# Patient Record
Sex: Male | Born: 1945
Health system: Southern US, Community
[De-identification: ages and names within clinical notes are randomized; demographics above are authoritative.]

## PROBLEM LIST (undated history)

## (undated) DIAGNOSIS — N400 Enlarged prostate without lower urinary tract symptoms: Secondary | ICD-10-CM

## (undated) DIAGNOSIS — K219 Gastro-esophageal reflux disease without esophagitis: Secondary | ICD-10-CM

## (undated) DIAGNOSIS — K2960 Other gastritis without bleeding: Secondary | ICD-10-CM

## (undated) DIAGNOSIS — K047 Periapical abscess without sinus: Secondary | ICD-10-CM

## (undated) DIAGNOSIS — H269 Unspecified cataract: Secondary | ICD-10-CM

## (undated) DIAGNOSIS — Z87442 Personal history of urinary calculi: Secondary | ICD-10-CM

## (undated) DIAGNOSIS — K6389 Other specified diseases of intestine: Secondary | ICD-10-CM

## (undated) DIAGNOSIS — C801 Malignant (primary) neoplasm, unspecified: Secondary | ICD-10-CM

## (undated) DIAGNOSIS — N200 Calculus of kidney: Secondary | ICD-10-CM

## (undated) HISTORY — DX: Unspecified cataract: H26.9

## (undated) HISTORY — PX: OTHER SURGICAL HISTORY: SHX169

## (undated) HISTORY — PX: COLON SURGERY: SHX602

## (undated) HISTORY — PX: PROSTATE BIOPSY: SHX241

---

## 2006-06-02 HISTORY — PX: HERNIA REPAIR: SHX51

## 2007-04-28 ENCOUNTER — Observation Stay (HOSPITAL_COMMUNITY): Admission: RE | Admit: 2007-04-28 | Discharge: 2007-04-28 | Payer: Self-pay | Admitting: General Surgery

## 2009-06-02 DIAGNOSIS — N200 Calculus of kidney: Secondary | ICD-10-CM

## 2009-06-02 HISTORY — DX: Calculus of kidney: N20.0

## 2009-12-03 ENCOUNTER — Emergency Department (HOSPITAL_COMMUNITY): Admission: EM | Admit: 2009-12-03 | Discharge: 2009-12-03 | Payer: Self-pay | Admitting: Emergency Medicine

## 2009-12-05 ENCOUNTER — Ambulatory Visit (HOSPITAL_COMMUNITY): Admission: RE | Admit: 2009-12-05 | Discharge: 2009-12-05 | Payer: Self-pay | Admitting: Urology

## 2009-12-10 ENCOUNTER — Ambulatory Visit (HOSPITAL_COMMUNITY): Admission: RE | Admit: 2009-12-10 | Discharge: 2009-12-10 | Payer: Self-pay | Admitting: Urology

## 2009-12-17 ENCOUNTER — Ambulatory Visit (HOSPITAL_COMMUNITY): Admission: RE | Admit: 2009-12-17 | Discharge: 2009-12-17 | Payer: Self-pay | Admitting: Urology

## 2009-12-27 ENCOUNTER — Ambulatory Visit (HOSPITAL_COMMUNITY): Admission: RE | Admit: 2009-12-27 | Discharge: 2009-12-27 | Payer: Self-pay | Admitting: Urology

## 2010-08-18 LAB — URINALYSIS, ROUTINE W REFLEX MICROSCOPIC
Glucose, UA: NEGATIVE mg/dL
Ketones, ur: NEGATIVE mg/dL
Protein, ur: NEGATIVE mg/dL

## 2010-08-18 LAB — DIFFERENTIAL
Basophils Relative: 1 % (ref 0–1)
Eosinophils Absolute: 0.2 10*3/uL (ref 0.0–0.7)
Eosinophils Relative: 2 % (ref 0–5)
Lymphocytes Relative: 24 % (ref 12–46)
Lymphs Abs: 1.8 10*3/uL (ref 0.7–4.0)
Monocytes Relative: 4 % (ref 3–12)
Neutro Abs: 5.2 10*3/uL (ref 1.7–7.7)
Neutrophils Relative %: 69 % (ref 43–77)

## 2010-08-18 LAB — CBC
HCT: 43.1 % (ref 39.0–52.0)
WBC: 7.6 10*3/uL (ref 4.0–10.5)

## 2010-08-18 LAB — COMPREHENSIVE METABOLIC PANEL
AST: 51 U/L — ABNORMAL HIGH (ref 0–37)
Albumin: 3.9 g/dL (ref 3.5–5.2)
Alkaline Phosphatase: 68 U/L (ref 39–117)
BUN: 16 mg/dL (ref 6–23)
GFR calc Af Amer: >60 mL/min (ref >60)
Potassium: 3.8 mEq/L (ref 3.5–5.1)
Sodium: 135 mEq/L (ref 135–145)
Total Bilirubin: 0.5 mg/dL (ref 0.3–1.2)
Total Protein: 7 g/dL (ref 6.0–8.3)

## 2010-08-18 LAB — URINE MICROSCOPIC-ADD ON

## 2010-10-15 NOTE — Op Note (Signed)
NAMEWENDALL, William Burns               ACCOUNT NO.:  [id number]   MEDICAL RECORD NO.:  [id number]          PATIENT TYPE:  AMB   LOCATION:  DAY                           FACILITY:  [facility]   PHYSICIAN:  Tilford Pillar, MD      DATE OF BIRTH:  1946-05-08   DATE OF PROCEDURE:  04/28/2007  DATE OF DISCHARGE:                               OPERATIVE REPORT   PREOPERATIVE DIAGNOSES:  Umbilical hernia (incarcerated).   POST-PROCEDURE DIAGNOSIS:  Umbilical hernia (incarcerated).   PROCEDURE:  Laparoscopic umbilical hernia repair with 10 by 15 cm  PROCEED mesh.   SURGEON:  Tilford Pillar, M.D.   ANESTHESIA:  General endotracheal.  Local anesthetic:  1% lidocaine  plain.   ESTIMATED BLOOD LOSS:  Minimal.   SPECIMENS:  None.   INDICATIONS:  Patient is a pleasant, 65 year old male, who had presented  to my office with a history of an umbilical bulge.  This had slowly  increased in size and was causing more burning discomfort.  On  evaluation, patient clearly had an umbilical hernia.  The risks,  benefits, and alternatives of a laparoscopic, versus an open hernia  repair were discussed at length with the patient.  Secondary to the  patient's occupation and need to return to work at a sooner time, it was  recommended that he undergo a laparoscopic repair.  The patient's  questions and concerns were addressed and patient was consented for the  planned laparoscopic umbilical hernia repair.   OPERATION:  Patient was taken to the operating room, was placed in the  supine position on the operating table, at which time a general  anesthetic was administered.  Once patient was asleep, he was  endotracheally intubated by anesthesia.  At this point, a Foley was  placed in sterile fashion by myself, and then his abdomen was prepped  and draped in the usual fashion.   A stab incision was made in the left upper quadrant at Palmer's point.  A Veress needle was inserted and a saline drop test was utilized  to  confirm intraperitoneal placement.  At this point, CO2 insufflation was  begun and, once sufficient pneumoperitoneum was obtained, an incision  was created in the right lateral abdominal wall.  An 11 mm trocar was  placed over a laparoscope and was inserted into the abdominal wall at  the incision, visualizing placement with the laparoscope into the  peritoneal cavity.  At this point, the inner cannula was removed.  The  laparoscope was reinserted.  There was no evidence of any Veress needle  or trocar placement injury.   At this point, the Veress needle was removed and attention was turned to  placement of a 5 mm trocar.  This was placed inferior to the previously-  placed 11 mm trocar in the right lateral abdominal wall, under direct  visualization with the laparoscope.   At this point, the hernia was identified, using combination of  electrocautery and blunt retraction on the hernia, the hernia contents  were reduced back into the abdominal cavity.  At this point, an  additional 5 mm trocar  was placed in the left lateral abdominal wall  under direct visualization with the laparoscope.  At this point,  attention was turned to measurement of the area around the hernia.  Using a marking pen and palpation around the anterior abdominal wall,  the planned sites of pexing sutures were placed and marked  appropriately.  The pneumoperitoneum was briefly evacuated in order to  allow adequate measurement, which measured 10 cm wide by 15 cm in  length.   At this point, the pneumoperitoneum was returned and a 10 by 15 cm  PROCEED mesh was brought to the field.  This was marked with a marking  pen for adequate orientation and 2-0 Novofil sutures were placed for the  pexing sutures at all four quadrants of the mesh.  The mesh was then  rolled and placed via the 11 mm trocar.  The laparoscope was inserted.  The mesh was positioned for adequate orientation.  Then, through a stab  incision at  the previously-marked pexing sites, the Novofil sutures were  pulled through the anterior abdominal wall, using an Endoclose suture-  passing device.  The sutures were briefly held in position with  hemostats.  Once all four quadrants were placed to the anterior  abdominal wall, the mesh was pulled taut to the anterior abdominal wall  to assure adequate positioning.  The hernia defect was well-covered.  The mesh was lying in excellent position.  At this point, the sutures  were secured and a ProTack spiral tacking device was brought to the  field and was utilized to tack the mesh circumferentially to the  anterior abdominal wall.  At this point, the mesh was lying in excellent  position.   A 5 mm laparoscope was then inserted through the left anterior abdominal  wall, allowing visualization of the 1 mm trocar site.  Using the  Endoclose suture-passing device, a 2-0 Vicryl suture was passed for  closure of the fascia at the 11 mm trocar site.  With this suture  placed, the pneumoperitoneum was evacuated, trocars were removed, the  Vicryl suture was secured.  Local anesthetic was injected at all trocar  sites, as well as around the area of mesh placement.  The skin edges at  all three trocar sites was reapproximated, using a 4-0 Monocryl in a  running subcuticular suture.  The skin was washed and dried with a moist  and dry towel.  Benzoin was applied around all trocar sites, as well as  all stab incisions.  Half-inch Steri-Strips were placed over the  incision.  The drapes were removed.  The patient was allowed to come out  of general anesthetic.  The Foley catheter was removed and the patient  was transferred to a regular hospital bed.  He was transferred to the  postanesthetic care unit in stable condition.   At the conclusion of the procedure, all instrument, sponge and needle  counts were correct.  The patient tolerated the procedure well.      Tilford Pillar, MD  Electronically  Signed     BZ/MEDQ  D:  04/28/2007  T:  04/28/2007  Job:  [id number]   cc:   Ernestina Penna  Fax: [phone]

## 2010-10-15 NOTE — H&P (Signed)
William Burns, William Burns               ACCOUNT NO.:  [id number]   MEDICAL RECORD NO.:  [id number]          PATIENT TYPE:  AMB   LOCATION:  DAY                           FACILITY:  [facility]   PHYSICIAN:  Tilford Pillar, MD      DATE OF BIRTH:  11/20/1945   DATE OF ADMISSION:  DATE OF DISCHARGE:  LH                              HISTORY & PHYSICAL   CHIEF COMPLAINT:  Umbilical bulge.   HISTORY OF PRESENT ILLNESS:  The patient is a 65 year old male with a  history of an umbilical hernia.  He had previously seen his primary care  physician who had explained the hernia to the patient.  At that time, he  chose not to do anything.  He has noted a slow increase in the size,  with increasing discomfort at the site.  He does state that the bulge  does become smaller at night.  He has had no symptoms of nausea and  vomiting, no signs or symptoms of incarceration or strangulation, and no  other masses or bulges noted.   PAST MEDICAL HISTORY:  None.   PAST SURGICAL HISTORY:  None.   MEDICATIONS:  None.   ALLERGIES:  NO KNOWN DRUG ALLERGIES.   SOCIAL HISTORY:  No tobacco, no alcohol, no recreational drug use.  The  patient is a Visual merchandiser.   FAMILY HISTORY:  Pertinent for Alzheimer's.   REVIEW OF SYSTEMS:  CONSTITUTIONAL:  Unremarkable.  EYES:  Unremarkable.  EARS, NOSE, AND THROAT:  Unremarkable.  RESPIRATORY:  Unremarkable.  CARDIOVASCULAR:  Unremarkable.  GASTROINTESTINAL:  Abdominal pain and as  per HPI.  Otherwise, unremarkable.  GENITOURINARY:  Unremarkable.  MUSCULOSKELETAL:  Unremarkable.  SKIN:  Unremarkable.  ENDOCRINE:  Unremarkable.  NEUROLOGIC:  Unremarkable.   PHYSICAL EXAMINATION:  GENERAL:  The patient is a healthy-appearing  male.  HEENT:  No scalp deformities, no masses.  Eyes:  Pupils are equal,  reactive, and round.  Extraocular movements are intact.  No conjunctival  pallor is noted.  Ears:  No diminished hearing is apparent on  examination.  Nose:  He does have a small  abrasion.  Otherwise,  unremarkable.  Oral mucosa is pink.  Normal oral occlusion.  NECK:  Trachea is midline.  No cervical lymphadenopathy is apparent.  PULMONARY:  Unlabored respirations.  No wheezing, no crackles.  Clear to  auscultation bilaterally.  CARDIOVASCULAR:  Regular rate and rhythm.  No murmurs apparent.  Pulses  2+ radial, 2+ dorsalis pedis bilaterally.  ABDOMEN:  Positive bowel sounds.  The abdomen is soft.  The abdomen is  mildly tender at the umbilicus.  No peritoneal signs, no significant  diffuse abdominal pain.  There is an apparent appreciated umbilical  hernia.  This is reducible on examination.  No other masses or hernias  are apparent.  SKIN:  Warm and dry.   ASSESSMENT AND PLAN:  Umbilical hernia.  The risks and benefits of a  laparoscopic or open umbilical hernia repair with mesh were discussed  with the patient.  Again, due to the size, it is likely that this will  require a mesh repair  as discussed with the patient.  The benefits in  this patient's circumstances of earlier return to work with a  laparoscopic approach were discussed.  Due to the patient's occupation  and limited ability for time away from work and limited ability for  light duty, we will to proceed with a laparoscopic approach.  The risks  of a laparoscopic hernia repair with mesh were discussed with the  patient, including the risks of bleeding, infection, infection requiring  removal of the mesh, the possibility of bowel injury, as well as the  possibility of intraoperative pulmonary or cardiac events.  The  patient's questions were answered, and the patient will be consented for  planned umbilical hernia repair through a laparoscopic approach.      Tilford Pillar, MD  Electronically Signed     BZ/MEDQ  D:  04/26/2007  T:  04/26/2007  Job:  [id number]   cc:   [facility]   Ernestina Penna  Fax: [phone]

## 2011-03-11 LAB — CBC
HCT: 43.4
Hemoglobin: 14.8
MCHC: 34.1
RBC: 5.06
WBC: 6

## 2011-03-11 LAB — BASIC METABOLIC PANEL
CO2: 28
Calcium: 9.5
Potassium: 4.1

## 2011-03-11 LAB — PROTIME-INR: INR: 1

## 2011-06-18 DIAGNOSIS — Z1211 Encounter for screening for malignant neoplasm of colon: Secondary | ICD-10-CM | POA: Diagnosis not present

## 2011-06-18 DIAGNOSIS — K921 Melena: Secondary | ICD-10-CM | POA: Diagnosis not present

## 2011-06-30 ENCOUNTER — Ambulatory Visit (HOSPITAL_COMMUNITY)
Admission: RE | Admit: 2011-06-30 | Discharge: 2011-06-30 | Disposition: A | Discharge status: Home / Self Care | Payer: Medicare Other | Source: Ambulatory Visit | Attending: Internal Medicine | Admitting: Internal Medicine

## 2011-06-30 ENCOUNTER — Other Ambulatory Visit (HOSPITAL_COMMUNITY): Payer: Self-pay | Admitting: Internal Medicine

## 2011-06-30 DIAGNOSIS — R0602 Shortness of breath: Secondary | ICD-10-CM

## 2011-07-01 ENCOUNTER — Emergency Department (HOSPITAL_COMMUNITY): Payer: Medicare Other

## 2011-07-01 ENCOUNTER — Other Ambulatory Visit: Payer: Self-pay

## 2011-07-01 ENCOUNTER — Inpatient Hospital Stay (HOSPITAL_COMMUNITY)
Admission: EM | Admit: 2011-07-01 | Discharge: 2011-07-12 | DRG: 330 | Disposition: A | Discharge status: Home / Self Care | Payer: Medicare Other | Attending: General Surgery | Admitting: General Surgery

## 2011-07-01 ENCOUNTER — Encounter (HOSPITAL_COMMUNITY): Payer: Self-pay | Admitting: *Deleted

## 2011-07-01 DIAGNOSIS — D630 Anemia in neoplastic disease: Secondary | ICD-10-CM | POA: Diagnosis present

## 2011-07-01 DIAGNOSIS — N210 Calculus in bladder: Secondary | ICD-10-CM | POA: Diagnosis present

## 2011-07-01 DIAGNOSIS — R339 Retention of urine, unspecified: Secondary | ICD-10-CM | POA: Diagnosis present

## 2011-07-01 DIAGNOSIS — D649 Anemia, unspecified: Secondary | ICD-10-CM

## 2011-07-01 DIAGNOSIS — R0602 Shortness of breath: Secondary | ICD-10-CM | POA: Diagnosis present

## 2011-07-01 DIAGNOSIS — Z860100 Personal history of colon polyps, unspecified: Secondary | ICD-10-CM

## 2011-07-01 DIAGNOSIS — N138 Other obstructive and reflux uropathy: Secondary | ICD-10-CM | POA: Diagnosis present

## 2011-07-01 DIAGNOSIS — M79609 Pain in unspecified limb: Secondary | ICD-10-CM | POA: Diagnosis not present

## 2011-07-01 DIAGNOSIS — K044 Acute apical periodontitis of pulpal origin: Secondary | ICD-10-CM | POA: Diagnosis present

## 2011-07-01 DIAGNOSIS — C189 Malignant neoplasm of colon, unspecified: Secondary | ICD-10-CM | POA: Diagnosis present

## 2011-07-01 DIAGNOSIS — M2040 Other hammer toe(s) (acquired), unspecified foot: Secondary | ICD-10-CM | POA: Diagnosis not present

## 2011-07-01 DIAGNOSIS — D128 Benign neoplasm of rectum: Secondary | ICD-10-CM | POA: Diagnosis present

## 2011-07-01 DIAGNOSIS — Z79899 Other long term (current) drug therapy: Secondary | ICD-10-CM

## 2011-07-01 DIAGNOSIS — K2960 Other gastritis without bleeding: Secondary | ICD-10-CM

## 2011-07-01 DIAGNOSIS — K047 Periapical abscess without sinus: Secondary | ICD-10-CM | POA: Diagnosis present

## 2011-07-01 DIAGNOSIS — M722 Plantar fascial fibromatosis: Secondary | ICD-10-CM | POA: Diagnosis not present

## 2011-07-01 DIAGNOSIS — K6389 Other specified diseases of intestine: Secondary | ICD-10-CM

## 2011-07-01 DIAGNOSIS — R5381 Other malaise: Secondary | ICD-10-CM | POA: Diagnosis not present

## 2011-07-01 DIAGNOSIS — K922 Gastrointestinal hemorrhage, unspecified: Secondary | ICD-10-CM | POA: Diagnosis not present

## 2011-07-01 DIAGNOSIS — N400 Enlarged prostate without lower urinary tract symptoms: Secondary | ICD-10-CM | POA: Diagnosis present

## 2011-07-01 DIAGNOSIS — R5383 Other fatigue: Secondary | ICD-10-CM | POA: Diagnosis not present

## 2011-07-01 DIAGNOSIS — C180 Malignant neoplasm of cecum: Principal | ICD-10-CM | POA: Diagnosis present

## 2011-07-01 DIAGNOSIS — N401 Enlarged prostate with lower urinary tract symptoms: Secondary | ICD-10-CM | POA: Diagnosis present

## 2011-07-01 DIAGNOSIS — D129 Benign neoplasm of anus and anal canal: Secondary | ICD-10-CM | POA: Diagnosis present

## 2011-07-01 DIAGNOSIS — Z87891 Personal history of nicotine dependence: Secondary | ICD-10-CM

## 2011-07-01 DIAGNOSIS — R7309 Other abnormal glucose: Secondary | ICD-10-CM | POA: Diagnosis present

## 2011-07-01 DIAGNOSIS — Z7982 Long term (current) use of aspirin: Secondary | ICD-10-CM

## 2011-07-01 DIAGNOSIS — D509 Iron deficiency anemia, unspecified: Secondary | ICD-10-CM | POA: Diagnosis present

## 2011-07-01 DIAGNOSIS — E669 Obesity, unspecified: Secondary | ICD-10-CM | POA: Diagnosis present

## 2011-07-01 DIAGNOSIS — R739 Hyperglycemia, unspecified: Secondary | ICD-10-CM | POA: Diagnosis present

## 2011-07-01 HISTORY — DX: Benign prostatic hyperplasia without lower urinary tract symptoms: N40.0

## 2011-07-01 HISTORY — DX: Periapical abscess without sinus: K04.7

## 2011-07-01 HISTORY — DX: Other specified diseases of intestine: K63.89

## 2011-07-01 HISTORY — DX: Other gastritis without bleeding: K29.60

## 2011-07-01 HISTORY — DX: Calculus of kidney: N20.0

## 2011-07-01 LAB — CBC
Hemoglobin: 6.2 g/dL — CL (ref 13.0–17.0)
MCH: 19 pg — ABNORMAL LOW (ref 26.0–34.0)
MCV: 69 fL — ABNORMAL LOW (ref 78.0–100.0)

## 2011-07-01 LAB — COMPREHENSIVE METABOLIC PANEL
AST: 32 U/L (ref 0–37)
Albumin: 3.4 g/dL — ABNORMAL LOW (ref 3.5–5.2)
Alkaline Phosphatase: 73 U/L (ref 39–117)
BUN: 16 mg/dL (ref 6–23)
CO2: 24 mEq/L (ref 19–32)
Chloride: 105 mEq/L (ref 96–112)
Creatinine, Ser: 0.99 mg/dL (ref 0.50–1.35)
GFR calc non Af Amer: 83 mL/min — ABNORMAL LOW (ref >90)
Potassium: 4.3 mEq/L (ref 3.5–5.1)
Total Bilirubin: 0.3 mg/dL (ref 0.3–1.2)

## 2011-07-01 LAB — URINALYSIS, ROUTINE W REFLEX MICROSCOPIC
Bilirubin Urine: NEGATIVE
Hgb urine dipstick: NEGATIVE
Ketones, ur: NEGATIVE mg/dL
Nitrite: NEGATIVE
Protein, ur: NEGATIVE mg/dL
Specific Gravity, Urine: 1.02 (ref 1.005–1.030)
Urobilinogen, UA: 0.2 mg/dL (ref 0.0–1.0)

## 2011-07-01 LAB — RETICULOCYTES
RBC.: 3.26 MIL/uL — ABNORMAL LOW (ref 4.22–5.81)
Retic Count, Absolute: 65.2 10*3/uL (ref 19.0–186.0)
Retic Ct Pct: 2 % (ref 0.4–3.1)

## 2011-07-01 NOTE — ED Notes (Signed)
Dr. Margo Aye called and spoke with Dr. Preston Fleeting about pt coming to ED. Pt had blood work this am and was contacted about abnormal labs, low hbg, pt does reports increased weakness, fatigue and pain in legs with ambulation

## 2011-07-01 NOTE — ED Provider Notes (Signed)
I have received a call from the patient's PCP, Dr. Margo Aye, who relates that he had a critical lab result called to him of a hemoglobin of 6.2. Initial orders have been placed for laboratory workup and transfusion.  Dione Booze, MD 07/01/11 2232

## 2011-07-02 ENCOUNTER — Encounter (HOSPITAL_COMMUNITY): Payer: Self-pay | Admitting: Internal Medicine

## 2011-07-02 DIAGNOSIS — K921 Melena: Secondary | ICD-10-CM

## 2011-07-02 DIAGNOSIS — D126 Benign neoplasm of colon, unspecified: Secondary | ICD-10-CM | POA: Diagnosis not present

## 2011-07-02 DIAGNOSIS — N200 Calculus of kidney: Secondary | ICD-10-CM | POA: Diagnosis not present

## 2011-07-02 DIAGNOSIS — C189 Malignant neoplasm of colon, unspecified: Secondary | ICD-10-CM | POA: Diagnosis not present

## 2011-07-02 DIAGNOSIS — R7989 Other specified abnormal findings of blood chemistry: Secondary | ICD-10-CM | POA: Diagnosis not present

## 2011-07-02 DIAGNOSIS — D630 Anemia in neoplastic disease: Secondary | ICD-10-CM | POA: Diagnosis not present

## 2011-07-02 DIAGNOSIS — N210 Calculus in bladder: Secondary | ICD-10-CM | POA: Diagnosis not present

## 2011-07-02 DIAGNOSIS — D649 Anemia, unspecified: Secondary | ICD-10-CM | POA: Diagnosis not present

## 2011-07-02 DIAGNOSIS — N401 Enlarged prostate with lower urinary tract symptoms: Secondary | ICD-10-CM | POA: Diagnosis not present

## 2011-07-02 DIAGNOSIS — Z7982 Long term (current) use of aspirin: Secondary | ICD-10-CM | POA: Diagnosis not present

## 2011-07-02 DIAGNOSIS — R7309 Other abnormal glucose: Secondary | ICD-10-CM | POA: Diagnosis present

## 2011-07-02 DIAGNOSIS — C180 Malignant neoplasm of cecum: Secondary | ICD-10-CM | POA: Diagnosis not present

## 2011-07-02 DIAGNOSIS — K2960 Other gastritis without bleeding: Secondary | ICD-10-CM | POA: Diagnosis not present

## 2011-07-02 DIAGNOSIS — K044 Acute apical periodontitis of pulpal origin: Secondary | ICD-10-CM | POA: Diagnosis not present

## 2011-07-02 DIAGNOSIS — Z79899 Other long term (current) drug therapy: Secondary | ICD-10-CM | POA: Diagnosis not present

## 2011-07-02 DIAGNOSIS — Z87891 Personal history of nicotine dependence: Secondary | ICD-10-CM | POA: Diagnosis not present

## 2011-07-02 DIAGNOSIS — K2289 Other specified disease of esophagus: Secondary | ICD-10-CM | POA: Diagnosis not present

## 2011-07-02 DIAGNOSIS — K2270 Barrett's esophagus without dysplasia: Secondary | ICD-10-CM | POA: Diagnosis not present

## 2011-07-02 DIAGNOSIS — E669 Obesity, unspecified: Secondary | ICD-10-CM | POA: Diagnosis present

## 2011-07-02 DIAGNOSIS — N400 Enlarged prostate without lower urinary tract symptoms: Secondary | ICD-10-CM | POA: Diagnosis not present

## 2011-07-02 DIAGNOSIS — K922 Gastrointestinal hemorrhage, unspecified: Secondary | ICD-10-CM | POA: Diagnosis not present

## 2011-07-02 DIAGNOSIS — R0602 Shortness of breath: Secondary | ICD-10-CM | POA: Diagnosis present

## 2011-07-02 DIAGNOSIS — Z860100 Personal history of colon polyps, unspecified: Secondary | ICD-10-CM

## 2011-07-02 DIAGNOSIS — D291 Benign neoplasm of prostate: Secondary | ICD-10-CM | POA: Diagnosis not present

## 2011-07-02 DIAGNOSIS — K0389 Other specified diseases of hard tissues of teeth: Secondary | ICD-10-CM | POA: Diagnosis not present

## 2011-07-02 DIAGNOSIS — R739 Hyperglycemia, unspecified: Secondary | ICD-10-CM | POA: Diagnosis present

## 2011-07-02 DIAGNOSIS — R339 Retention of urine, unspecified: Secondary | ICD-10-CM | POA: Diagnosis present

## 2011-07-02 DIAGNOSIS — N3289 Other specified disorders of bladder: Secondary | ICD-10-CM | POA: Diagnosis not present

## 2011-07-02 DIAGNOSIS — C19 Malignant neoplasm of rectosigmoid junction: Secondary | ICD-10-CM | POA: Diagnosis not present

## 2011-07-02 DIAGNOSIS — D509 Iron deficiency anemia, unspecified: Secondary | ICD-10-CM | POA: Diagnosis present

## 2011-07-02 DIAGNOSIS — D128 Benign neoplasm of rectum: Secondary | ICD-10-CM | POA: Diagnosis present

## 2011-07-02 DIAGNOSIS — R5381 Other malaise: Secondary | ICD-10-CM | POA: Diagnosis not present

## 2011-07-02 LAB — CARDIAC PANEL(CRET KIN+CKTOT+MB+TROPI)
CK, MB: 3 ng/mL (ref 0.3–4.0)
Relative Index: 2.1 (ref 0.0–2.5)
Relative Index: 2.3 (ref 0.0–2.5)
Total CK: 142 U/L (ref 7–232)
Total CK: 109 U/L (ref 7–232)

## 2011-07-02 LAB — BASIC METABOLIC PANEL
BUN: 14 mg/dL (ref 6–23)
Chloride: 105 mEq/L (ref 96–112)
GFR calc Af Amer: >90 mL/min (ref >90)
GFR calc non Af Amer: 86 mL/min — ABNORMAL LOW (ref >90)
Glucose, Bld: 101 mg/dL — ABNORMAL HIGH (ref 70–99)
Potassium: 4.1 mEq/L (ref 3.5–5.1)
Sodium: 138 mEq/L (ref 135–145)

## 2011-07-02 LAB — DIFFERENTIAL
Basophils Absolute: 0.1 10*3/uL (ref 0.0–0.1)
Basophils Relative: 1 % (ref 0–1)
Lymphocytes Relative: 8 % — ABNORMAL LOW (ref 12–46)
Lymphs Abs: 0.7 10*3/uL (ref 0.7–4.0)
Monocytes Absolute: 0.2 10*3/uL (ref 0.1–1.0)
Neutro Abs: 7.6 10*3/uL (ref 1.7–7.7)
Neutrophils Relative %: 89 % — ABNORMAL HIGH (ref 43–77)

## 2011-07-02 LAB — POCT I-STAT, CHEM 8
Calcium, Ion: 1.18 mmol/L (ref 1.12–1.32)
Glucose, Bld: 152 mg/dL — ABNORMAL HIGH (ref 70–99)
HCT: 23 % — ABNORMAL LOW (ref 39.0–52.0)
Hemoglobin: 7.8 g/dL — ABNORMAL LOW (ref 13.0–17.0)
Potassium: 4.4 mEq/L (ref 3.5–5.1)

## 2011-07-02 LAB — CBC
HCT: 26.1 % — ABNORMAL LOW (ref 39.0–52.0)
Hemoglobin: 7.8 g/dL — ABNORMAL LOW (ref 13.0–17.0)
MCHC: 29.9 g/dL — ABNORMAL LOW (ref 30.0–36.0)
RBC: 3.62 MIL/uL — ABNORMAL LOW (ref 4.22–5.81)

## 2011-07-02 LAB — PREPARE RBC (CROSSMATCH)

## 2011-07-02 LAB — ABO/RH: ABO/RH(D): B POS

## 2011-07-02 LAB — POCT I-STAT TROPONIN I: Troponin i, poc: 0 ng/mL (ref 0.00–0.08)

## 2011-07-02 LAB — TSH: TSH: 1.524 u[IU]/mL (ref 0.350–4.500)

## 2011-07-02 LAB — HEMOGLOBIN A1C: Hgb A1c MFr Bld: 5.7 % — ABNORMAL HIGH (ref <5.7)

## 2011-07-02 MED ORDER — ONDANSETRON HCL 4 MG/2ML IJ SOLN: 4.0000 mg | Freq: Four times a day (QID) | INTRAMUSCULAR | Status: DC | PRN

## 2011-07-02 MED ORDER — PENICILLIN V POTASSIUM 250 MG PO TABS
500.0000 mg | ORAL_TABLET | Freq: Three times a day (TID) | ORAL | Status: DC
  Administered 2011-07-02 – 2011-07-09 (×19): 500 mg via ORAL
  Filled 2011-07-02: qty 2
  Filled 2011-07-02 (×3): qty 1
  Filled 2011-07-02 (×3): qty 2
  Filled 2011-07-02: qty 4
  Filled 2011-07-02: qty 1
  Filled 2011-07-02 (×11): qty 2

## 2011-07-02 MED ORDER — PANTOPRAZOLE SODIUM 40 MG IV SOLR
40.0000 mg | Freq: Two times a day (BID) | INTRAVENOUS | Status: DC
  Administered 2011-07-02 – 2011-07-03 (×3): 40 mg via INTRAVENOUS
  Filled 2011-07-02 (×3): qty 40

## 2011-07-02 MED ORDER — ACETAMINOPHEN 325 MG PO TABS: 650.0000 mg | ORAL_TABLET | Freq: Four times a day (QID) | ORAL | Status: DC | PRN

## 2011-07-02 MED ORDER — POTASSIUM CHLORIDE IN NACL 20-0.9 MEQ/L-% IV SOLN
INTRAVENOUS | Status: DC
  Administered 2011-07-02: 09:00:00 via INTRAVENOUS
  Administered 2011-07-03: 950 mL via INTRAVENOUS

## 2011-07-02 MED ORDER — SODIUM CHLORIDE 0.9 % IV SOLN
1.5000 g | Freq: Four times a day (QID) | INTRAVENOUS | Status: DC
  Filled 2011-07-02 (×3): qty 1.5

## 2011-07-02 MED ORDER — PEG 3350-KCL-NA BICARB-NACL 420 G PO SOLR
4000.0000 mL | Freq: Once | ORAL | Status: AC
  Administered 2011-07-02: 4000 mL via ORAL
  Filled 2011-07-02: qty 4000

## 2011-07-02 MED ORDER — SODIUM CHLORIDE 0.9 % IJ SOLN
INTRAMUSCULAR | Status: AC
  Administered 2011-07-02: 03:00:00
  Filled 2011-07-02: qty 3

## 2011-07-02 MED ORDER — ACETAMINOPHEN 650 MG RE SUPP: 650.0000 mg | Freq: Four times a day (QID) | RECTAL | Status: DC | PRN

## 2011-07-02 MED ORDER — ONDANSETRON HCL 4 MG PO TABS: 4.0000 mg | ORAL_TABLET | Freq: Four times a day (QID) | ORAL | Status: DC | PRN

## 2011-07-02 MED ORDER — FUROSEMIDE 10 MG/ML IJ SOLN
20.0000 mg | Freq: Once | INTRAMUSCULAR | Status: AC
  Administered 2011-07-02: 20 mg via INTRAVENOUS
  Filled 2011-07-02: qty 2

## 2011-07-02 NOTE — ED Notes (Signed)
Transported to room 316 with telemetry.  Monitor shows NSR without ectopy.

## 2011-07-02 NOTE — ED Provider Notes (Signed)
History     CSN: [id number]  Arrival date & time 07/01/11  2219    Chief Complaint  Patient presents with  . Weakness    HPI Pt was seen at 2335.  Per pt and his spouse, c/o gradual onset and worsening of persistent generalized "weakness" and fatigue for the last 3 weeks.  Pt has been eval by his PMD for same yesterday and today; having a CXR and labs.  Pt was called tonight and told to come to the ED for eval/admit for "HGB 6.2."  Pt denies CP/palpitations, no SOB, no abd pain, no N/V/D, no back pain, no abd pain, no black or blood in stools, no fevers, no rash.    PMD:  Dr. Hughie Closs History reviewed. No pertinent past medical history.  Past Surgical History  Procedure Date  . Hernia repair   . Prostate biopsy     History  Substance Use Topics  . Smoking status: Never Smoker   . Smokeless tobacco: Not on file  . Alcohol Use: No    Review of Systems ROS: Statement: All systems negative except as marked or noted in the HPI; Constitutional: Negative for fever and chills. ; ; Eyes: Negative for eye pain, redness and discharge. ; ; ENMT: Negative for ear pain, hoarseness, nasal congestion, sinus pressure and sore throat. ; ; Cardiovascular: Negative for chest pain, palpitations, diaphoresis, dyspnea and peripheral edema. ; ; Respiratory: Negative for cough, wheezing and stridor. ; ; Gastrointestinal: Negative for nausea, vomiting, diarrhea, abdominal pain, blood in stool, hematemesis, jaundice and rectal bleeding. . ; ; Genitourinary: Negative for dysuria, flank pain and hematuria. ; ; Musculoskeletal: Negative for back pain and neck pain. Negative for swelling and trauma.; ; Skin: Negative for pruritus, rash, abrasions, blisters, bruising and skin lesion.; ; Neuro: +generalized weakness.  Negative for headache, lightheadedness and neck stiffness. Negative for altered level of consciousness , altered mental status, extremity weakness, paresthesias, involuntary movement, seizure and  syncope.     Allergies  Review of patient's allergies indicates no known allergies.  Home Medications   Current Outpatient Rx  Name Route Sig Dispense Refill  . ASPIRIN EC 81 MG PO TBEC Oral Take 81 mg by mouth daily.    . ADULT MULTIVITAMIN W/MINERALS CH Oral Take 1 tablet by mouth daily. MEGA MENS 50+ MULTIVITAMIN    . FISH OIL TRIPLE STRENGTH PO Oral Take 1 capsule by mouth daily.    Marland Kitchen PENICILLIN V POTASSIUM 500 MG PO TABS Oral Take 500 mg by mouth 4 (four) times daily.    Marland Kitchen TAMSULOSIN HCL 0.4 MG PO CAPS Oral Take 0.4 mg by mouth daily.       BP 134/69  Pulse 97  Temp(Src) 98.2 F (36.8 C) (Oral)  Resp 20  Wt 250 lb (113.399 kg)  SpO2 100%  Physical Exam 2340: Physical examination:  Nursing notes reviewed; Vital signs and O2 SAT reviewed;  Constitutional: Well developed, Well nourished, Well hydrated, In no acute distress; Head:  Normocephalic, atraumatic; Eyes: EOMI, PERRL, No scleral icterus; ENMT: Mouth and pharynx normal, Mucous membranes moist; Neck: Supple, Full range of motion, No lymphadenopathy; Cardiovascular: Regular rate and rhythm, No murmur or gallop; Respiratory: Breath sounds clear & equal bilaterally, No rales, rhonchi, wheezes, Normal respiratory effort/excursion; Chest: Nontender, Movement normal; Abdomen: Soft, Nontender, Nondistended, Normal bowel sounds, Rectal exam performed w/permission of pt and ED RN chaparone present.  Anal tone normal.  Non-tender, soft brown stool in rectal vault, heme positive.  No fissures,  no external hemorrhoids, no palp masses.; Extremities: Pulses normal, No tenderness, No edema, No calf edema or asymmetry.; Neuro: AA&Ox3, Major CN grossly intact.  No gross focal motor or sensory deficits in extremities.; Skin: Color pale, Warm, Dry, no rash.    ED Course  Procedures     MDM  MDM Reviewed: previous chart, nursing note and vitals Reviewed previous: labs, ECG and x-ray Interpretation: labs and ECG      Date: 07/02/2011   Rate: 87  Rhythm: normal sinus rhythm  QRS Axis: normal  Intervals: normal  ST/T Wave abnormalities: normal  Conduction Disutrbances:none  Narrative Interpretation:   Old EKG Reviewed: unchanged; no significant changes from previous EKG dated 12/03/2009.  Results for orders placed during the hospital encounter of 07/01/11  CBC      Component Value Range   WBC 8.6  4.0 - 10.5 (K/uL)   RBC 3.26 (*) 4.22 - 5.81 (MIL/uL)   Hemoglobin 6.2 (*) 13.0 - 17.0 (g/dL)   HCT 16.1 (*) 09.6 - 52.0 (%)   MCV 69.0 (*) 78.0 - 100.0 (fL)   MCH 19.0 (*) 26.0 - 34.0 (pg)   MCHC 27.6 (*) 30.0 - 36.0 (g/dL)   RDW 04.5  40.9 - 81.1 (%)   Platelets 350  150 - 400 (K/uL)  DIFFERENTIAL      Component Value Range   Neutrophils Relative 89 (*) 43 - 77 (%)   Lymphocytes Relative 8 (*) 12 - 46 (%)   Monocytes Relative 2 (*) 3 - 12 (%)   Eosinophils Relative 0  0 - 5 (%)   Basophils Relative 1  0 - 1 (%)   Neutro Abs 7.6  1.7 - 7.7 (K/uL)   Lymphs Abs 0.7  0.7 - 4.0 (K/uL)   Monocytes Absolute 0.2  0.1 - 1.0 (K/uL)   Eosinophils Absolute 0.0  0.0 - 0.7 (K/uL)   Basophils Absolute 0.1  0.0 - 0.1 (K/uL)  COMPREHENSIVE METABOLIC PANEL      Component Value Range   Sodium 138  135 - 145 (mEq/L)   Potassium 4.3  3.5 - 5.1 (mEq/L)   Chloride 105  96 - 112 (mEq/L)   CO2 24  19 - 32 (mEq/L)   Glucose, Bld 169 (*) 70 - 99 (mg/dL)   BUN 16  6 - 23 (mg/dL)   Creatinine, Ser 9.14  0.50 - 1.35 (mg/dL)   Calcium 8.7  8.4 - 78.2 (mg/dL)   Total Protein 6.8  6.0 - 8.3 (g/dL)   Albumin 3.4 (*) 3.5 - 5.2 (g/dL)   AST 32  0 - 37 (U/L)   ALT 25  0 - 53 (U/L)   Alkaline Phosphatase 73  39 - 117 (U/L)   Total Bilirubin 0.3  0.3 - 1.2 (mg/dL)   GFR calc non Af Amer 83 (*) >90 (mL/min)   GFR calc Af Amer >90  >90 (mL/min)  RETICULOCYTES      Component Value Range   Retic Ct Pct 2.0  0.4 - 3.1 (%)   RBC. 3.26 (*) 4.22 - 5.81 (MIL/uL)   Retic Count, Manual 65.2  19.0 - 186.0 (K/uL)  URINALYSIS, ROUTINE W REFLEX  MICROSCOPIC      Component Value Range   Color, Urine YELLOW  YELLOW    APPearance CLEAR  CLEAR    Specific Gravity, Urine 1.020  1.005 - 1.030    pH 6.0  5.0 - 8.0    Glucose, UA 100 (*) NEGATIVE (mg/dL)   Hgb urine dipstick  NEGATIVE  NEGATIVE    Bilirubin Urine NEGATIVE  NEGATIVE    Ketones, ur NEGATIVE  NEGATIVE (mg/dL)   Protein, ur NEGATIVE  NEGATIVE (mg/dL)   Urobilinogen, UA 0.2  0.0 - 1.0 (mg/dL)   Nitrite NEGATIVE  NEGATIVE    Leukocytes, UA NEGATIVE  NEGATIVE   TYPE AND SCREEN      Component Value Range   ABO/RH(D) B POS     Antibody Screen NEG     Sample Expiration 07/04/2011    PREPARE RBC (CROSSMATCH)      Component Value Range   Order Confirmation ORDER PROCESSED BY BLOOD BANK      Dg Chest 2 View 06/30/2011  *RADIOLOGY REPORT*  Clinical Data: Short of breath  CHEST - 2 VIEW  Comparison: 12/03/2009  Findings: Normal heart size.  Clear lungs.  No pneumothorax and no pleural effusion. Mild hyperaeration.  IMPRESSION: No active cardiopulmonary disease.  Original Report Authenticated By: Donavan Burnet, M.D.     Moon.Benedict:  Hgb 6.2, PRBC transfusion ordered.  Dx testing d/w pt and family.  Questions answered.  Verb understanding, agreeable to admit.  T/C to [facility] Dr. Orvan Falconer, case discussed, including:  HPI, pertinent PM/SHx, VS/PE, dx testing, ED course and treatment.  Agreeable to admit.  Requests to obtain tele bed.     Josslyn Ciolek Allison Quarry, DO 07/04/11 [phone]

## 2011-07-02 NOTE — Progress Notes (Signed)
UR Chart Review Completed  

## 2011-07-02 NOTE — ED Notes (Signed)
Dr Orvan Falconer in talking with patient and his wife.

## 2011-07-02 NOTE — Consult Note (Signed)
Reason for Consult  GI bleed Referring Physician:  [name] is an 66 y.o. male.  HPI: Admitted last night thru the ED.He was notified by Dr. Margo Aye last night that his hemoglobin was 6.2. He has received 2 units of blood since admission.  He denies seeing blood in his stool.  He tells me he had been very tired and had gradually became weaker and weaker. Symptoms worse the past 3 weeks.  His stool was guaiac positive in the ED.  His appetite has been good. There has been no weight.  He has actually gained about 5 pounds.  His wife says his left  lower leg has been swelling.  He denies acid reflux.  No abdominal pain.  His wife also tell me that back in December he had a cough and thought he had the flu.  He would cough and have rt upper quadrant pain. He usually has 1-2 stools a day.  Stools are dark brown. Stools have normal size. His last colonoscopy was in the 1980s or early 90s with polyps. He did not follow up. He has been on ASA 81mg  x 3 weeks. Symptoms started before this. No other NSAIDS  Past Medical History  Diagnosis Date  . BPH (benign prostatic hyperplasia)   . Kidney stones 2011    Past Surgical History  Procedure Date  . Hernia repair   . Prostate biopsy     Family History  Problem Relation Age of Onset  . Alzheimer's disease Mother   . Alzheimer's disease Father   . Aneurysm Brother     brain    Social History:  reports that he quit smoking about 22 years ago. His smoking use included Cigarettes. He has a 50 pack-year smoking history. He does not have any smokeless tobacco history on file. He reports that he does not drink alcohol. His drug history not on file.  Allergies: No Known Allergies  Medications: I have reviewed the patient's current medications.  Results for orders placed during the hospital encounter of 07/01/11 (from the past 48 hour(s))  CBC     Status: Abnormal   Collection Time   07/01/11 10:29 PM      Component Value Range Comment   WBC 8.6  4.0 - 10.5 (K/uL)    RBC 3.26 (*) 4.22 - 5.81 (MIL/uL)    Hemoglobin 6.2 (*) 13.0 - 17.0 (g/dL)    HCT 45.4 (*) 09.8 - 52.0 (%)    MCV 69.0 (*) 78.0 - 100.0 (fL)    MCH 19.0 (*) 26.0 - 34.0 (pg)    MCHC 27.6 (*) 30.0 - 36.0 (g/dL)    RDW 11.9  14.7 - 82.9 (%)    Platelets 350  150 - 400 (K/uL)   DIFFERENTIAL     Status: Abnormal   Collection Time   07/01/11 10:29 PM      Component Value Range Comment   Neutrophils Relative 89 (*) 43 - 77 (%)    Lymphocytes Relative 8 (*) 12 - 46 (%)    Monocytes Relative 2 (*) 3 - 12 (%)    Eosinophils Relative 0  0 - 5 (%)    Basophils Relative 1  0 - 1 (%)    Neutro Abs 7.6  1.7 - 7.7 (K/uL)    Lymphs Abs 0.7  0.7 - 4.0 (K/uL)    Monocytes Absolute 0.2  0.1 - 1.0 (K/uL)    Eosinophils Absolute 0.0  0.0 - 0.7 (K/uL)  Basophils Absolute 0.1  0.0 - 0.1 (K/uL)   COMPREHENSIVE METABOLIC PANEL     Status: Abnormal   Collection Time   07/01/11 10:29 PM      Component Value Range Comment   Sodium 138  135 - 145 (mEq/L)    Potassium 4.3  3.5 - 5.1 (mEq/L)    Chloride 105  96 - 112 (mEq/L)    CO2 24  19 - 32 (mEq/L)    Glucose, Bld 169 (*) 70 - 99 (mg/dL)    BUN 16  6 - 23 (mg/dL)    Creatinine, Ser 9.14  0.50 - 1.35 (mg/dL)    Calcium 8.7  8.4 - 10.5 (mg/dL)    Total Protein 6.8  6.0 - 8.3 (g/dL)    Albumin 3.4 (*) 3.5 - 5.2 (g/dL)    AST 32  0 - 37 (U/L)    ALT 25  0 - 53 (U/L)    Alkaline Phosphatase 73  39 - 117 (U/L)    Total Bilirubin 0.3  0.3 - 1.2 (mg/dL)    GFR calc non Af Amer 83 (*) >90 (mL/min)    GFR calc Af Amer >90  >90 (mL/min)   RETICULOCYTES     Status: Abnormal   Collection Time   07/01/11 10:29 PM      Component Value Range Comment   Retic Ct Pct 2.0  0.4 - 3.1 (%)    RBC. 3.26 (*) 4.22 - 5.81 (MIL/uL)    Retic Count, Manual 65.2  19.0 - 186.0 (K/uL)   TYPE AND SCREEN     Status: Normal (Preliminary result)   Collection Time   07/01/11 10:31 PM      Component Value Range Comment   ABO/RH(D) B POS      Antibody  Screen NEG      Sample Expiration 07/04/2011      Unit Number [id number]      Blood Component Type RED CELLS,LR      Unit division 00      Status of Unit ISSUED      Transfusion Status OK TO TRANSFUSE      Crossmatch Result Compatible      Unit Number [id number]      Blood Component Type RED CELLS,LR      Unit division 00      Status of Unit ISSUED      Transfusion Status OK TO TRANSFUSE      Crossmatch Result Compatible     URINALYSIS, ROUTINE W REFLEX MICROSCOPIC     Status: Abnormal   Collection Time   07/01/11 10:50 PM      Component Value Range Comment   Color, Urine YELLOW  YELLOW     APPearance CLEAR  CLEAR     Specific Gravity, Urine 1.020  1.005 - 1.030     pH 6.0  5.0 - 8.0     Glucose, UA 100 (*) NEGATIVE (mg/dL)    Hgb urine dipstick NEGATIVE  NEGATIVE     Bilirubin Urine NEGATIVE  NEGATIVE     Ketones, ur NEGATIVE  NEGATIVE (mg/dL)    Protein, ur NEGATIVE  NEGATIVE (mg/dL)    Urobilinogen, UA 0.2  0.0 - 1.0 (mg/dL)    Nitrite NEGATIVE  NEGATIVE     Leukocytes, UA NEGATIVE  NEGATIVE  MICROSCOPIC NOT DONE ON URINES WITH NEGATIVE PROTEIN, BLOOD, LEUKOCYTES, NITRITE, OR GLUCOSE <1000 mg/dL.  PREPARE RBC (CROSSMATCH)     Status: Normal   Collection Time   07/01/11  11:00 PM      Component Value Range Comment   Order Confirmation ORDER PROCESSED BY BLOOD BANK     ABO/RH     Status: Normal   Collection Time   07/01/11 11:10 PM      Component Value Range Comment   ABO/RH(D) B POS     POCT I-STAT TROPONIN I     Status: Normal   Collection Time   07/02/11 12:21 AM      Component Value Range Comment   Troponin i, poc 0.00  0.00 - 0.08 (ng/mL)    Comment 3            POCT I-STAT, CHEM 8     Status: Abnormal   Collection Time   07/02/11 12:23 AM      Component Value Range Comment   Sodium 141  135 - 145 (mEq/L)    Potassium 4.4  3.5 - 5.1 (mEq/L)    Chloride 106  96 - 112 (mEq/L)    BUN 17  6 - 23 (mg/dL)    Creatinine, Ser 4.09  0.50 - 1.35 (mg/dL)    Glucose, Bld 811  (*) 70 - 99 (mg/dL)    Calcium, Ion 9.14  1.12 - 1.32 (mmol/L)    TCO2 24  0 - 100 (mmol/L)    Hemoglobin 7.8 (*) 13.0 - 17.0 (g/dL)    HCT 78.2 (*) 95.6 - 52.0 (%)   APTT     Status: Normal   Collection Time   07/02/11  1:59 AM      Component Value Range Comment   aPTT 29  24 - 37 (seconds)   PROTIME-INR     Status: Abnormal   Collection Time   07/02/11  1:59 AM      Component Value Range Comment   Prothrombin Time 15.5 (*) 11.6 - 15.2 (seconds)    INR 1.20  0.00 - 1.49    CARDIAC PANEL(CRET KIN+CKTOT+MB+TROPI)     Status: Normal   Collection Time   07/02/11  1:59 AM      Component Value Range Comment   Total CK 142  7 - 232 (U/L)    CK, MB 3.0  0.3 - 4.0 (ng/mL)    Troponin I <0.30  <0.30 (ng/mL)    Relative Index 2.1  0.0 - 2.5      Dg Chest 2 View  06/30/2011  *RADIOLOGY REPORT*  Clinical Data: Short of breath  CHEST - 2 VIEW  Comparison: 12/03/2009  Findings: Normal heart size.  Clear lungs.  No pneumothorax and no pleural effusion. Mild hyperaeration.  IMPRESSION: No active cardiopulmonary disease.  Original Report Authenticated By: Donavan Burnet, M.D.    Review of Systems  Constitutional: Negative for fever and chills.   see hpi Blood pressure 142/70, pulse 68, temperature 98.1 F (36.7 C), temperature source Oral, resp. rate 16, height 6\' 3"  (1.905 m), weight 244 lb 14.9 oz (111.1 kg), SpO2 91.00%. Physical ExamAlert and oriented. Skin warm and dry. Oral mucosa is moist.   . Sclera anicteric, conjunctivae is pink. Thyroid not enlarged. No cervical lymphadenopathy. Lungs clear. Heart regular rate and rhythm.  Abdomen is soft. Bowel sounds are positive. No hepatomegaly. No abdominal masses felt. No tenderness.  No edema to lower extremities. Patient is alert and oriented.   Assessment/Plan: Anemia. Heme  Positive stools.  Colonic neoplasm needs to be ruled out. Differential includes colonic polyps, diverticular bleed, AVM, gastric carcinoma, gastric ulcer. I will discuss  with Dr. Karilyn Cota.  Colonoscopy/EGD  SETZER,TERRI W 07/02/2011, 8:20 AM

## 2011-07-02 NOTE — H&P (Signed)
PCP:   Dwana Melena, MD, MD   Chief Complaint:  Progressive shortness of breath for 3 weeks  HPI: William Burns is an 66 y.o. Caucasian male.   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  in 3 weeks ago began to feel very sick again weakness in his legs and arms and very short of breath with exertion  Denies fever cough or cold he denies bloody or black black stool he denies nausea or he denies dyspepsia. Denies vomiting diarrhea or constipation He has been having some right lower quadrant abdominal pain which at the site of his old hernia surgery.  He denies use of Goody's Powders, ibuprofen, Aleve or any other NSAID. Denies alcohol.  he reports she had a colonoscopy back in the 80s and was told he had polyps, but never followed up for repeat colonoscopys, and his doctors subsequently retired.  He's been getting checked out by his new doctor Dr. Margo Aye since November, and blood work today revealed a hemoglobin of 6.2 and he was sent into the emergency room for further evaluation.    Rewiew of Systems:  The patient denies anorexia, fever, weight loss,, vision loss, decreased hearing, hoarseness, chest pain, syncope,  peripheral edema, balance deficits, hemoptysis, abdominal pain, melena, hematochezia, severe indigestion/heartburn, hematuria, incontinence, genital sores, muscle weakness, suspicious skin lesions, transient blindness, difficulty walking, depression, unusual weight change, abnormal bleeding, enlarged lymph nodes, angioedema, and breast masses.    Past Medical History  Diagnosis Date  . BPH (benign prostatic hyperplasia)     Past Surgical History  Procedure Date  . Hernia repair   . Prostate biopsy     Medications:  HOME MEDS: Prior to Admission medications   Medication Sig Start Date End Date Taking? Authorizing Provider  aspirin EC  81 MG tablet Take 81 mg by mouth daily.   Yes Historical Provider, MD  Multiple Vitamin (MULITIVITAMIN WITH MINERALS) TABS Take 1 tablet by mouth daily. MEGA MENS 50+ MULTIVITAMIN   Yes Historical Provider, MD  Omega-3 Fatty Acids (FISH OIL TRIPLE STRENGTH PO) Take 1 capsule by mouth daily.   Yes Historical Provider, MD  penicillin v potassium (VEETID) 500 MG tablet Take 500 mg by mouth 4 (four) times daily.   Yes Historical Provider, MD  Tamsulosin HCl (FLOMAX) 0.4 MG CAPS Take 0.4 mg by mouth daily.    Yes Historical Provider, MD     Allergies:  No Known Allergies  Social History:   reports that he quit smoking about 22 years ago. His smoking use included Cigarettes. He has a 50 pack-year smoking history. He does not have any smokeless tobacco history on file. He reports that he does not drink alcohol. His drug history not on file.  Family History: Family History  Problem Relation Age of Onset  . Alzheimer's disease Mother   . Alzheimer's disease Father   . Aneurysm Brother     brain     Physical Exam: Filed Vitals:   07/01/11 2250 07/01/11 2252 07/01/11 2254 07/02/11 0128  BP: 136/66 141/64 134/69 138/69  Pulse: 96 95 97 79  Temp:    97.7 F (36.5 C)  TempSrc:    Oral  Resp:    18  Height:    6\' 3"  (1.905 m)  Weight:    110 kg (242 lb 8.1 oz)  SpO2:    95%  Blood pressure 138/69, pulse 79, temperature 97.7 F (36.5 C), temperature source Oral, resp. rate 18, height 6\' 3"  (1.905 m), weight 110 kg (242 lb 8.1 oz), SpO2 95.00%.  GEN:  Pleasant pleasant middle-aged Caucasian man  lying in the stretcher in no acute distress; cooperative with exam PSYCH:  alert and oriented x4; does not appear anxious does not appear depressed; affect is normal HEENT: Mucous membranes pink and anicteric; PERRLA; EOM intact; no cervical lymphadenopathy nor thyromegaly or carotid bruit; no JVD; Breasts:: Not examined CHEST WALL: No tenderness CHEST: Normal respiration, clear to auscultation  bilaterally HEART: Regular rate and rhythm; no murmurs rubs or gallops BACK: No kyphosis or scoliosis; no CVA tenderness ABDOMEN: Obese, soft non-tender; no masses, no organomegaly, normal abdominal bowel sounds; no intertriginous candida. Rectal Exam: EDP reports brown guaiac positive stool EXTREMITIES: age-appropriate arthropathy of the hands and knees; no edema; no ulcerations. Genitalia: not examined PULSES: 2+ and symmetric SKIN: Normal hydration no rash or ulceration CNS: Cranial nerves 2-12 grossly intact no focal neurologic deficit   Labs & Imaging Results for orders placed during the hospital encounter of 07/01/11 (from the past 48 hour(s))  CBC     Status: Abnormal   Collection Time   07/01/11 10:29 PM      Component Value Range Comment   WBC 8.6  4.0 - 10.5 (K/uL)    RBC 3.26 (*) 4.22 - 5.81 (MIL/uL)    Hemoglobin 6.2 (*) 13.0 - 17.0 (g/dL)    HCT 16.1 (*) 09.6 - 52.0 (%)    MCV 69.0 (*) 78.0 - 100.0 (fL)    MCH 19.0 (*) 26.0 - 34.0 (pg)    MCHC 27.6 (*) 30.0 - 36.0 (g/dL)    RDW 04.5  40.9 - 81.1 (%)    Platelets 350  150 - 400 (K/uL)   DIFFERENTIAL     Status: Abnormal   Collection Time   07/01/11 10:29 PM      Component Value Range Comment   Neutrophils Relative 89 (*) 43 - 77 (%)    Lymphocytes Relative 8 (*) 12 - 46 (%)    Monocytes Relative 2 (*) 3 - 12 (%)    Eosinophils Relative 0  0 - 5 (%)    Basophils Relative 1  0 - 1 (%)    Neutro Abs 7.6  1.7 - 7.7 (K/uL)    Lymphs Abs 0.7  0.7 - 4.0 (K/uL)    Monocytes Absolute 0.2  0.1 - 1.0 (K/uL)    Eosinophils Absolute 0.0  0.0 - 0.7 (K/uL)    Basophils Absolute 0.1  0.0 - 0.1 (K/uL)   COMPREHENSIVE METABOLIC PANEL     Status: Abnormal   Collection Time   07/01/11 10:29 PM      Component Value Range Comment   Sodium 138  135 - 145 (mEq/L)    Potassium 4.3  3.5 - 5.1 (mEq/L)    Chloride 105  96 - 112 (mEq/L)    CO2 24  19 - 32 (mEq/L)    Glucose, Bld 169 (*) 70 - 99 (mg/dL)    BUN 16  6 - 23 (mg/dL)     Creatinine, Ser 9.14  0.50 - 1.35 (mg/dL)    Calcium 8.7  8.4 - 10.5 (mg/dL)    Total Protein 6.8  6.0 - 8.3 (g/dL)    Albumin 3.4 (*) 3.5 - 5.2 (g/dL)    AST 32  0 - 37 (U/L)    ALT 25  0 - 53 (  U/L)    Alkaline Phosphatase 73  39 - 117 (U/L)    Total Bilirubin 0.3  0.3 - 1.2 (mg/dL)    GFR calc non Af Amer 83 (*) >90 (mL/min)    GFR calc Af Amer >90  >90 (mL/min)   RETICULOCYTES     Status: Abnormal   Collection Time   07/01/11 10:29 PM      Component Value Range Comment   Retic Ct Pct 2.0  0.4 - 3.1 (%)    RBC. 3.26 (*) 4.22 - 5.81 (MIL/uL)    Retic Count, Manual 65.2  19.0 - 186.0 (K/uL)   TYPE AND SCREEN     Status: Normal (Preliminary result)   Collection Time   07/01/11 10:31 PM      Component Value Range Comment   ABO/RH(D) B POS      Antibody Screen NEG      Sample Expiration 07/04/2011      Unit Number [id number]      Blood Component Type RED CELLS,LR      Unit division 00      Status of Unit ALLOCATED      Transfusion Status OK TO TRANSFUSE      Crossmatch Result Compatible      Unit Number [id number]      Blood Component Type RED CELLS,LR      Unit division 00      Status of Unit ALLOCATED      Transfusion Status OK TO TRANSFUSE      Crossmatch Result Compatible     URINALYSIS, ROUTINE W REFLEX MICROSCOPIC     Status: Abnormal   Collection Time   07/01/11 10:50 PM      Component Value Range Comment   Color, Urine YELLOW  YELLOW     APPearance CLEAR  CLEAR     Specific Gravity, Urine 1.020  1.005 - 1.030     pH 6.0  5.0 - 8.0     Glucose, UA 100 (*) NEGATIVE (mg/dL)    Hgb urine dipstick NEGATIVE  NEGATIVE     Bilirubin Urine NEGATIVE  NEGATIVE     Ketones, ur NEGATIVE  NEGATIVE (mg/dL)    Protein, ur NEGATIVE  NEGATIVE (mg/dL)    Urobilinogen, UA 0.2  0.0 - 1.0 (mg/dL)    Nitrite NEGATIVE  NEGATIVE     Leukocytes, UA NEGATIVE  NEGATIVE  MICROSCOPIC NOT DONE ON URINES WITH NEGATIVE PROTEIN, BLOOD, LEUKOCYTES, NITRITE, OR GLUCOSE <1000 mg/dL.  PREPARE RBC  (CROSSMATCH)     Status: Normal   Collection Time   07/01/11 11:00 PM      Component Value Range Comment   Order Confirmation ORDER PROCESSED BY BLOOD BANK     ABO/RH     Status: Normal   Collection Time   07/01/11 11:10 PM      Component Value Range Comment   ABO/RH(D) B POS     POCT I-STAT TROPONIN I     Status: Normal   Collection Time   07/02/11 12:21 AM      Component Value Range Comment   Troponin i, poc 0.00  0.00 - 0.08 (ng/mL)    Comment 3            POCT I-STAT, CHEM 8     Status: Abnormal   Collection Time   07/02/11 12:23 AM      Component Value Range Comment   Sodium 141  135 - 145 (mEq/L)    Potassium 4.4  3.5 -  5.1 (mEq/L)    Chloride 106  96 - 112 (mEq/L)    BUN 17  6 - 23 (mg/dL)    Creatinine, Ser 4.09  0.50 - 1.35 (mg/dL)    Glucose, Bld 811 (*) 70 - 99 (mg/dL)    Calcium, Ion 9.14  1.12 - 1.32 (mmol/L)    TCO2 24  0 - 100 (mmol/L)    Hemoglobin 7.8 (*) 13.0 - 17.0 (g/dL)    HCT 78.2 (*) 95.6 - 52.0 (%)    Dg Chest 2 View  06/30/2011  *RADIOLOGY REPORT*  Clinical Data: Short of breath  CHEST - 2 VIEW  Comparison: 12/03/2009  Findings: Normal heart size.  Clear lungs.  No pneumothorax and no pleural effusion. Mild hyperaeration.  IMPRESSION: No active cardiopulmonary disease.  Original Report Authenticated By: Donavan Burnet, M.D.      Assessment Present on Admission:  .Microcytic hypochromic anemia, probably from chronic lower GI  .SOB (shortness of breath); probably secondary to anemia  .Hyperglycemia .BPH (benign prostatic hyperplasia)   PLAN:  admit this gentleman for an anemia workup transfusion and given the benefit of a GI consult; in view of this in a remote history of colonic polyps,.  Other plans as per orders.   Jasmen Emrich 07/02/2011, 1:43 AM

## 2011-07-02 NOTE — Consult Note (Signed)
I have interviewed and examined the patient. Details are summarized in the scissors note. Patient presents with progressive weakness and exertional dyspnea and found to have microcytic anemia and heme-positive stools. No history of heart burn nausea vomiting dysphagia or rectal bleeding he has noted melena this morning. He has history of colonic polyps; last exam was over 20 years ago Anemia profile is pending. Will proceed with diagnostic EGD and colonoscopy

## 2011-07-02 NOTE — Progress Notes (Signed)
Subjective: Patient feels better. He is less short of breath. He did have one bowel movement this morning which he says was brown in color. He has no, pain, nausea, or vomiting.  Objective: Vital signs in last 24 hours: Filed Vitals:   07/02/11 0530 07/02/11 0633 07/02/11 0736 07/02/11 0830  BP: 136/69 132/70 142/70 128/71  Pulse: 69 73 68 66  Temp: 97.6 F (36.4 C) 97.7 F (36.5 C) 98.1 F (36.7 C) 97.8 F (36.6 C)  TempSrc: Oral  Oral Oral  Resp: 12 16 16 16   Height:      Weight: 111.1 kg (244 lb 14.9 oz)     SpO2:  95% 91%     Intake/Output Summary (Last 24 hours) at 07/02/11 0953 Last data filed at 07/02/11 0755  Gross per 24 hour  Intake 330.83 ml  Output    700 ml  Net -369.17 ml    Weight change:   Physical exam: Lungs: Clear to auscultation bilaterally. Heart: S1, S2, with a soft systolic murmur. Abdomen: Mildly obese, positive bowel sounds, nontender, nondistended. Extremities: No pedal edema.  Lab Results: Basic Metabolic Panel:  Basename 07/02/11 0023 07/01/11 2229  NA 141 138  K 4.4 4.3  CL 106 105  CO2 -- 24  GLUCOSE 152* 169*  BUN 17 16  CREATININE 1.00 0.99  CALCIUM -- 8.7  MG -- --  PHOS -- --   Liver Function Tests:  [facility] 07/01/11 2229  AST 32  ALT 25  ALKPHOS 73  BILITOT 0.3  PROT 6.8  ALBUMIN 3.4*   No results found for this basename: LIPASE:2,AMYLASE:2 in the last 72 hours No results found for this basename: AMMONIA:2 in the last 72 hours CBC:  Basename 07/02/11 0926 07/02/11 0023 07/01/11 2229  WBC 8.0 -- 8.6  NEUTROABS -- -- 7.6  HGB 7.8* 7.8* --  HCT 26.1* 23.0* --  MCV 72.1* -- 69.0*  PLT 318 -- 350   Cardiac Enzymes:  Basename 07/02/11 0159  CKTOTAL 142  CKMB 3.0  CKMBINDEX --  TROPONINI <0.30   BNP: No results found for this basename: PROBNP:3 in the last 72 hours D-Dimer: No results found for this basename: DDIMER:2 in the last 72 hours CBG: No results found for this basename: GLUCAP:6 in the last 72  hours Hemoglobin A1C: No results found for this basename: HGBA1C in the last 72 hours Fasting Lipid Panel: No results found for this basename: CHOL,HDL,LDLCALC,TRIG,CHOLHDL,LDLDIRECT in the last 72 hours Thyroid Function Tests: No results found for this basename: TSH,T4TOTAL,FREET4,T3FREE,THYROIDAB in the last 72 hours Anemia Panel:  Basename 07/01/11 2229  VITAMINB12 --  FOLATE --  FERRITIN --  TIBC --  IRON --  RETICCTPCT 2.0   Coagulation:  Basename 07/02/11 0159  LABPROT 15.5*  INR 1.20   Urine Drug Screen: Drugs of Abuse  No results found for this basename: labopia, cocainscrnur, labbenz, amphetmu, thcu, labbarb    Alcohol Level: No results found for this basename: ETH:2 in the last 72 hours Urinalysis:  Misc. Labs:  Micro: No results found for this or any previous visit (from the past 240 hour(s)).  Studies/Results: Dg Chest 2 View  06/30/2011  *RADIOLOGY REPORT*  Clinical Data: Short of breath  CHEST - 2 VIEW  Comparison: 12/03/2009  Findings: Normal heart size.  Clear lungs.  No pneumothorax and no pleural effusion. Mild hyperaeration.  IMPRESSION: No active cardiopulmonary disease.  Original Report Authenticated By: Donavan Burnet, M.D.    Medications: I have reviewed the patient's current  medications.  Assessment: Active Problems:  Microcytic hypochromic anemia  SOB (shortness of breath)  Hx of colonic polyp  Hyperglycemia  BPH (benign prostatic hyperplasia)  1. Symptomatic microcytic anemia. This is secondary to a chronic GI bleed until proven otherwise. He is been transfused 2 units of packed red blood cells. His hemoglobin increased from 6.2 to 7.8. Dr. Karilyn Cota assessment is pending. Anemia panel results are pending. TSH is pending  Hyperglycemia. He has no known history of diabetes. Hemoglobin A1c is pending.   Shortness of breath secondary to anemia. Currently resolved. His lungs are clear.   Plan:  1. Decreased IV fluids some. Transfuse 1  more unit of packed red blood cells. We'll give 20 mg of Lasix following it. We'll start clear liquid diet. Further recommendations per GI.  LOS: 1 day   William Burns 07/02/2011, 9:53 AM

## 2011-07-02 NOTE — ED Notes (Signed)
Pt has positive hemocult, edp performed. Results entered in poc machine.

## 2011-07-03 ENCOUNTER — Inpatient Hospital Stay (HOSPITAL_COMMUNITY): Payer: Medicare Other

## 2011-07-03 ENCOUNTER — Encounter (HOSPITAL_COMMUNITY): Payer: Self-pay | Admitting: *Deleted

## 2011-07-03 ENCOUNTER — Encounter (HOSPITAL_COMMUNITY)
Admission: EM | Disposition: A | Discharge status: Home / Self Care | Payer: Self-pay | Source: Home / Self Care | Attending: General Surgery

## 2011-07-03 ENCOUNTER — Other Ambulatory Visit (INDEPENDENT_AMBULATORY_CARE_PROVIDER_SITE_OTHER): Payer: Self-pay | Admitting: Internal Medicine

## 2011-07-03 DIAGNOSIS — C189 Malignant neoplasm of colon, unspecified: Secondary | ICD-10-CM | POA: Diagnosis present

## 2011-07-03 DIAGNOSIS — K6389 Other specified diseases of intestine: Secondary | ICD-10-CM

## 2011-07-03 DIAGNOSIS — K2960 Other gastritis without bleeding: Secondary | ICD-10-CM | POA: Diagnosis present

## 2011-07-03 DIAGNOSIS — D129 Benign neoplasm of anus and anal canal: Secondary | ICD-10-CM

## 2011-07-03 DIAGNOSIS — D126 Benign neoplasm of colon, unspecified: Secondary | ICD-10-CM

## 2011-07-03 DIAGNOSIS — K047 Periapical abscess without sinus: Secondary | ICD-10-CM | POA: Diagnosis present

## 2011-07-03 DIAGNOSIS — K639 Disease of intestine, unspecified: Secondary | ICD-10-CM

## 2011-07-03 DIAGNOSIS — K228 Other specified diseases of esophagus: Secondary | ICD-10-CM

## 2011-07-03 DIAGNOSIS — D128 Benign neoplasm of rectum: Secondary | ICD-10-CM

## 2011-07-03 HISTORY — DX: Periapical abscess without sinus: K04.7

## 2011-07-03 HISTORY — PX: ESOPHAGOGASTRODUODENOSCOPY: SHX5428

## 2011-07-03 HISTORY — DX: Other gastritis without bleeding: K29.60

## 2011-07-03 HISTORY — DX: Other specified diseases of intestine: K63.89

## 2011-07-03 HISTORY — PX: COLONOSCOPY: SHX5424

## 2011-07-03 LAB — TYPE AND SCREEN
Antibody Screen: NEGATIVE
Unit division: 0
Unit division: 0

## 2011-07-03 LAB — URINALYSIS, ROUTINE W REFLEX MICROSCOPIC
Glucose, UA: NEGATIVE mg/dL
Hgb urine dipstick: NEGATIVE
Leukocytes, UA: NEGATIVE
pH: 5.5 (ref 5.0–8.0)

## 2011-07-03 LAB — URINE CULTURE: Culture: NO GROWTH

## 2011-07-03 LAB — IRON AND TIBC
Saturation Ratios: 2 % — ABNORMAL LOW (ref 20–55)
UIBC: 459 ug/dL — ABNORMAL HIGH (ref 125–400)

## 2011-07-03 LAB — CBC
MCH: 21.9 pg — ABNORMAL LOW (ref 26.0–34.0)
MCHC: 30.1 g/dL (ref 30.0–36.0)
Platelets: 325 10*3/uL (ref 150–400)
RDW: 17.8 % — ABNORMAL HIGH (ref 11.5–15.5)

## 2011-07-03 LAB — FOLATE: Folate: 10.6 ng/mL

## 2011-07-03 SURGERY — EGD (ESOPHAGOGASTRODUODENOSCOPY)
Anesthesia: Moderate Sedation

## 2011-07-03 MED ORDER — PANTOPRAZOLE SODIUM 40 MG PO TBEC: 40.0000 mg | DELAYED_RELEASE_TABLET | Freq: Every day | ORAL | Status: DC

## 2011-07-03 MED ORDER — IOHEXOL 300 MG/ML  SOLN
100.0000 mL | Freq: Once | INTRAMUSCULAR | Status: AC | PRN
  Administered 2011-07-03: 100 mL via INTRAVENOUS

## 2011-07-03 MED ORDER — BUTAMBEN-TETRACAINE-BENZOCAINE 2-2-14 % EX AERO
INHALATION_SPRAY | CUTANEOUS | Status: DC | PRN
  Administered 2011-07-03: 1 via TOPICAL

## 2011-07-03 MED ORDER — SODIUM CHLORIDE 0.9 % IV SOLN
1.0000 g | INTRAVENOUS | Status: DC
  Filled 2011-07-03: qty 1

## 2011-07-03 MED ORDER — PANTOPRAZOLE SODIUM 40 MG IV SOLR
40.0000 mg | Freq: Two times a day (BID) | INTRAVENOUS | Status: DC
  Administered 2011-07-03 – 2011-07-04 (×2): 40 mg via INTRAVENOUS
  Filled 2011-07-03 (×2): qty 40

## 2011-07-03 MED ORDER — STERILE WATER FOR IRRIGATION IR SOLN
Status: DC | PRN
  Administered 2011-07-03: 13:00:00

## 2011-07-03 MED ORDER — ALVIMOPAN 12 MG PO CAPS
12.0000 mg | ORAL_CAPSULE | Freq: Once | ORAL | Status: DC
  Administered 2011-07-04: 12 mg via ORAL

## 2011-07-03 MED ORDER — SODIUM CHLORIDE 0.9 % IJ SOLN
INTRAMUSCULAR | Status: AC
  Administered 2011-07-03: 10 mL
  Filled 2011-07-03: qty 3

## 2011-07-03 MED ORDER — MEPERIDINE HCL 50 MG/ML IJ SOLN
INTRAMUSCULAR | Status: AC
  Filled 2011-07-03: qty 1

## 2011-07-03 MED ORDER — ENOXAPARIN SODIUM 40 MG/0.4ML ~~LOC~~ SOLN
40.0000 mg | Freq: Once | SUBCUTANEOUS | Status: DC
  Administered 2011-07-04: 40 mg via SUBCUTANEOUS

## 2011-07-03 MED ORDER — SODIUM CHLORIDE 0.9 % IV SOLN
INTRAVENOUS | Status: AC
  Filled 2011-07-03: qty 1

## 2011-07-03 MED ORDER — MIDAZOLAM HCL 5 MG/5ML IJ SOLN
INTRAMUSCULAR | Status: DC | PRN
  Administered 2011-07-03: 1 mg via INTRAVENOUS
  Administered 2011-07-03: 2 mg via INTRAVENOUS
  Administered 2011-07-03 (×2): 1 mg via INTRAVENOUS
  Administered 2011-07-03: 2 mg via INTRAVENOUS

## 2011-07-03 MED ORDER — MEPERIDINE HCL 25 MG/ML IJ SOLN
INTRAMUSCULAR | Status: DC | PRN
  Administered 2011-07-03 (×2): 25 mg via INTRAVENOUS

## 2011-07-03 MED ORDER — SODIUM CHLORIDE 0.9 % IJ SOLN
INTRAMUSCULAR | Status: AC
  Filled 2011-07-03: qty 3

## 2011-07-03 MED ORDER — MIDAZOLAM HCL 5 MG/5ML IJ SOLN
INTRAMUSCULAR | Status: AC
  Administered 2011-07-04: 2 mg via INTRAVENOUS
  Filled 2011-07-03: qty 10

## 2011-07-03 NOTE — Progress Notes (Signed)
Endoscopic findings reviewed with patient; he has cecal mass cw carcinoma. He will need right hemicolectomy and Dr. Lovell Sheehan consulted. CT A/P this evening; he may PRBCs but will defer to Dr. Lovell Sheehan.

## 2011-07-03 NOTE — Progress Notes (Signed)
Subjective: Patient feels better. He is hungry. He was up all night because of multiple bowel movements from the colon prep. He did see what he thought was some blood in his stool last night. He denies abdominal pain. He denies shortness of breath. He says that he forgot to tell me about a tooth infection and that is why he was on penicillin before admission.  Objective: Vital signs in last 24 hours: Filed Vitals:   07/02/11 1800 07/02/11 2143 07/03/11 0513 07/03/11 1110  BP: 131/70 122/68 122/66 154/76  Pulse: 74 68 63 71  Temp: 98.2 F (36.8 C) 98.1 F (36.7 C) 98.2 F (36.8 C) 98 F (36.7 C)  TempSrc: Oral Oral Oral Oral  Resp: 16 18 18 21   Height:    6\' 3"  (1.905 m)  Weight:   111.3 kg (245 lb 6 oz) 113.399 kg (250 lb)  SpO2: 97% 97% 92% 97%    Intake/Output Summary (Last 24 hours) at 07/03/11 1123 Last data filed at 07/03/11 1047  Gross per 24 hour  Intake   1377 ml  Output    550 ml  Net    827 ml    Weight change: -2.099 kg (-4 lb 10.1 oz)  Physical exam: Oral pharynx. No obvious pus pockets or erythema around his comments and teeth. Lungs: Clear to auscultation bilaterally. Heart: S1, S2, with a soft systolic murmur. Abdomen: Mildly obese, positive bowel sounds, nontender, nondistended. Extremities: No pedal edema.  Lab Results: Basic Metabolic Panel:  Basename 07/02/11 0926 07/02/11 0023 07/01/11 2229  NA 138 141 --  K 4.1 4.4 --  CL 105 106 --  CO2 25 -- 24  GLUCOSE 101* 152* --  BUN 14 17 --  CREATININE 0.91 1.00 --  CALCIUM 9.2 -- 8.7  MG -- -- --  PHOS -- -- --   Liver Function Tests:  [facility] 07/01/11 2229  AST 32  ALT 25  ALKPHOS 73  BILITOT 0.3  PROT 6.8  ALBUMIN 3.4*   No results found for this basename: LIPASE:2,AMYLASE:2 in the last 72 hours No results found for this basename: AMMONIA:2 in the last 72 hours CBC:  Basename 07/03/11 0501 07/02/11 0926 07/01/11 2229  WBC 7.5 8.0 --  NEUTROABS -- -- 7.6  HGB 8.9* 7.8* --  HCT  29.6* 26.1* --  MCV 72.7* 72.1* --  PLT 325 318 --   Cardiac Enzymes:  Basename 07/02/11 0929 07/02/11 0159  CKTOTAL 109 142  CKMB 2.5 3.0  CKMBINDEX -- --  TROPONINI <0.30 <0.30   BNP: No results found for this basename: PROBNP:3 in the last 72 hours D-Dimer: No results found for this basename: DDIMER:2 in the last 72 hours CBG: No results found for this basename: GLUCAP:6 in the last 72 hours Hemoglobin A1C:  Basename 07/02/11 0200  HGBA1C 5.7*   Fasting Lipid Panel: No results found for this basename: CHOL,HDL,LDLCALC,TRIG,CHOLHDL,LDLDIRECT in the last 72 hours Thyroid Function Tests:  Basename 07/02/11 0159  TSH 1.524  T4TOTAL --  FREET4 --  T3FREE --  THYROIDAB --   Anemia Panel:  Basename 07/01/11 2229  VITAMINB12 --  FOLATE --  FERRITIN --  TIBC --  IRON --  RETICCTPCT 2.0   Coagulation:  Basename 07/02/11 0159  LABPROT 15.5*  INR 1.20   Urine Drug Screen: Drugs of Abuse  No results found for this basename: labopia,  cocainscrnur,  labbenz,  amphetmu,  thcu,  labbarb    Alcohol Level: No results found for this basename: ETH:2  in the last 72 hours Urinalysis:  Misc. Labs:  Micro: Recent Results (from the past 240 hour(s))  URINE CULTURE     Status: Normal   Collection Time   07/01/11 11:15 PM      Component Value Range Status Comment   Specimen Description URINE, CLEAN CATCH   Final    Special Requests NONE   Final    Culture  Setup Time [id number]   Final    Colony Count NO GROWTH   Final    Culture NO GROWTH   Final    Report Status 07/03/2011 FINAL   Final     Studies/Results: No results found.  Medications: I have reviewed the patient's current medications.  Assessment: Active Problems:  Microcytic hypochromic anemia  SOB (shortness of breath)  Hx of colonic polyp  Hyperglycemia  BPH (benign prostatic hyperplasia)  Tooth infection  1. Symptomatic microcytic anemia. This is secondary to a chronic GI bleed until proven  otherwise. He has been transfused a total of 3 units of packed red blood cells. His hemoglobin had increased from 6.2 and to 8.9 today. An anemia panel was ordered but apparently not completed. His TSH is within normal limits. Dr. Karilyn Cota has seen the patient and is planning an EGD/colonoscopy today.  Hyperglycemia. Resolving. His hemoglobin A1c is 5.7 to  Shortness of breath secondary to anemia. Currently resolved. His lungs are clear.  Tooth infection. Penicillin has been started, however, it is being given IV.  Plan:  EGD and colonoscopy today.    LOS: 2 days   William Burns 07/03/2011, 11:23 AM

## 2011-07-03 NOTE — Consult Note (Signed)
Reason for Consult: Cecal neoplasm Referring Physician: [facility] hospitalist, Dr. Karilyn Cota, Dr. Shelva Majestic  William Burns is an 66 y.o. male.  HPI: Patient is a 66 year old white male who presented to [facility] with shortness of breath and anemia. Colonoscopy was done by Dr. Karilyn Cota which reveals a cecal carcinoma. He is about to undergo a CT scan for staging. He states he has been having some blood per rectum and has had a colonoscopy in remote past.  Past Medical History  Diagnosis Date  . BPH (benign prostatic hyperplasia)   . Kidney stones 2011  . Tooth infection 07/03/2011  . Colonic mass 07/03/2011  . Erosive gastritis 07/03/2011    Past Surgical History  Procedure Date  . Hernia repair   . Prostate biopsy     Family History  Problem Relation Age of Onset  . Alzheimer's disease Mother   . Alzheimer's disease Father   . Aneurysm Brother     brain  . Colon cancer Neg Hx     Social History:  reports that he quit smoking about 22 years ago. His smoking use included Cigarettes. He has a 50 pack-year smoking history. He does not have any smokeless tobacco history on file. He reports that he does not drink alcohol or use illicit drugs.  Allergies: No Known Allergies  Medications: I have reviewed the patient's current medications.  Results for orders placed during the hospital encounter of 07/01/11 (from the past 48 hour(s))  CBC     Status: Abnormal   Collection Time   07/01/11 10:29 PM      Component Value Range Comment   WBC 8.6  4.0 - 10.5 (K/uL)    RBC 3.26 (*) 4.22 - 5.81 (MIL/uL)    Hemoglobin 6.2 (*) 13.0 - 17.0 (g/dL)    HCT 16.1 (*) 09.6 - 52.0 (%)    MCV 69.0 (*) 78.0 - 100.0 (fL)    MCH 19.0 (*) 26.0 - 34.0 (pg)    MCHC 27.6 (*) 30.0 - 36.0 (g/dL)    RDW 04.5  40.9 - 81.1 (%)    Platelets 350  150 - 400 (K/uL)   DIFFERENTIAL     Status: Abnormal   Collection Time   07/01/11 10:29 PM      Component Value Range Comment   Neutrophils Relative 89 (*) 43  - 77 (%)    Lymphocytes Relative 8 (*) 12 - 46 (%)    Monocytes Relative 2 (*) 3 - 12 (%)    Eosinophils Relative 0  0 - 5 (%)    Basophils Relative 1  0 - 1 (%)    Neutro Abs 7.6  1.7 - 7.7 (K/uL)    Lymphs Abs 0.7  0.7 - 4.0 (K/uL)    Monocytes Absolute 0.2  0.1 - 1.0 (K/uL)    Eosinophils Absolute 0.0  0.0 - 0.7 (K/uL)    Basophils Absolute 0.1  0.0 - 0.1 (K/uL)   COMPREHENSIVE METABOLIC PANEL     Status: Abnormal   Collection Time   07/01/11 10:29 PM      Component Value Range Comment   Sodium 138  135 - 145 (mEq/L)    Potassium 4.3  3.5 - 5.1 (mEq/L)    Chloride 105  96 - 112 (mEq/L)    CO2 24  19 - 32 (mEq/L)    Glucose, Bld 169 (*) 70 - 99 (mg/dL)    BUN 16  6 - 23 (mg/dL)    Creatinine, Ser 9.14  0.50 - 1.35 (mg/dL)    Calcium 8.7  8.4 - 10.5 (mg/dL)    Total Protein 6.8  6.0 - 8.3 (g/dL)    Albumin 3.4 (*) 3.5 - 5.2 (g/dL)    AST 32  0 - 37 (U/L)    ALT 25  0 - 53 (U/L)    Alkaline Phosphatase 73  39 - 117 (U/L)    Total Bilirubin 0.3  0.3 - 1.2 (mg/dL)    GFR calc non Af Amer 83 (*) >90 (mL/min)    GFR calc Af Amer >90  >90 (mL/min)   RETICULOCYTES     Status: Abnormal   Collection Time   07/01/11 10:29 PM      Component Value Range Comment   Retic Ct Pct 2.0  0.4 - 3.1 (%)    RBC. 3.26 (*) 4.22 - 5.81 (MIL/uL)    Retic Count, Manual 65.2  19.0 - 186.0 (K/uL)   TYPE AND SCREEN     Status: Normal   Collection Time   07/01/11 10:31 PM      Component Value Range Comment   ABO/RH(D) B POS      Antibody Screen NEG      Sample Expiration 07/04/2011      Unit Number [id number]      Blood Component Type RED CELLS,LR      Unit division 00      Status of Unit ISSUED,FINAL      Transfusion Status OK TO TRANSFUSE      Crossmatch Result Compatible      Unit Number [id number]      Blood Component Type RED CELLS,LR      Unit division 00      Status of Unit ISSUED,FINAL      Transfusion Status OK TO TRANSFUSE      Crossmatch Result Compatible      Unit Number [id number]       Blood Component Type RBC LR PHER1      Unit division 00      Status of Unit ISSUED,FINAL      Transfusion Status OK TO TRANSFUSE      Crossmatch Result Compatible     URINALYSIS, ROUTINE W REFLEX MICROSCOPIC     Status: Abnormal   Collection Time   07/01/11 10:50 PM      Component Value Range Comment   Color, Urine YELLOW  YELLOW     APPearance CLEAR  CLEAR     Specific Gravity, Urine 1.020  1.005 - 1.030     pH 6.0  5.0 - 8.0     Glucose, UA 100 (*) NEGATIVE (mg/dL)    Hgb urine dipstick NEGATIVE  NEGATIVE     Bilirubin Urine NEGATIVE  NEGATIVE     Ketones, ur NEGATIVE  NEGATIVE (mg/dL)    Protein, ur NEGATIVE  NEGATIVE (mg/dL)    Urobilinogen, UA 0.2  0.0 - 1.0 (mg/dL)    Nitrite NEGATIVE  NEGATIVE     Leukocytes, UA NEGATIVE  NEGATIVE  MICROSCOPIC NOT DONE ON URINES WITH NEGATIVE PROTEIN, BLOOD, LEUKOCYTES, NITRITE, OR GLUCOSE <1000 mg/dL.  PREPARE RBC (CROSSMATCH)     Status: Normal   Collection Time   07/01/11 11:00 PM      Component Value Range Comment   Order Confirmation ORDER PROCESSED BY BLOOD BANK     ABO/RH     Status: Normal   Collection Time   07/01/11 11:10 PM      Component Value Range Comment   ABO/RH(D)  B POS     URINE CULTURE     Status: Normal   Collection Time   07/01/11 11:15 PM      Component Value Range Comment   Specimen Description URINE, CLEAN CATCH      Special Requests NONE      Culture  Setup Time [id number]      Colony Count NO GROWTH      Culture NO GROWTH      Report Status 07/03/2011 FINAL     OCCULT BLOOD, POC DEVICE     Status: Normal   Collection Time   07/02/11 12:07 AM      Component Value Range Comment   Fecal Occult Bld POSITIVE     POCT I-STAT TROPONIN I     Status: Normal   Collection Time   07/02/11 12:21 AM      Component Value Range Comment   Troponin i, poc 0.00  0.00 - 0.08 (ng/mL)    Comment 3            POCT I-STAT, CHEM 8     Status: Abnormal   Collection Time   07/02/11 12:23 AM      Component Value Range Comment    Sodium 141  135 - 145 (mEq/L)    Potassium 4.4  3.5 - 5.1 (mEq/L)    Chloride 106  96 - 112 (mEq/L)    BUN 17  6 - 23 (mg/dL)    Creatinine, Ser 8.11  0.50 - 1.35 (mg/dL)    Glucose, Bld 914 (*) 70 - 99 (mg/dL)    Calcium, Ion 7.82  1.12 - 1.32 (mmol/L)    TCO2 24  0 - 100 (mmol/L)    Hemoglobin 7.8 (*) 13.0 - 17.0 (g/dL)    HCT 95.6 (*) 21.3 - 52.0 (%)   APTT     Status: Normal   Collection Time   07/02/11  1:59 AM      Component Value Range Comment   aPTT 29  24 - 37 (seconds)   PROTIME-INR     Status: Abnormal   Collection Time   07/02/11  1:59 AM      Component Value Range Comment   Prothrombin Time 15.5 (*) 11.6 - 15.2 (seconds)    INR 1.20  0.00 - 1.49    TSH     Status: Normal   Collection Time   07/02/11  1:59 AM      Component Value Range Comment   TSH 1.524  0.350 - 4.500 (uIU/mL)   CARDIAC PANEL(CRET KIN+CKTOT+MB+TROPI)     Status: Normal   Collection Time   07/02/11  1:59 AM      Component Value Range Comment   Total CK 142  7 - 232 (U/L)    CK, MB 3.0  0.3 - 4.0 (ng/mL)    Troponin I <0.30  <0.30 (ng/mL)    Relative Index 2.1  0.0 - 2.5    HEMOGLOBIN A1C     Status: Abnormal   Collection Time   07/02/11  2:00 AM      Component Value Range Comment   Hemoglobin A1C 5.7 (*) <5.7 (%)    Mean Plasma Glucose 117 (*) <117 (mg/dL)   BASIC METABOLIC PANEL     Status: Abnormal   Collection Time   07/02/11  9:26 AM      Component Value Range Comment   Sodium 138  135 - 145 (mEq/L)    Potassium 4.1  3.5 - 5.1 (mEq/L)  Chloride 105  96 - 112 (mEq/L)    CO2 25  19 - 32 (mEq/L)    Glucose, Bld 101 (*) 70 - 99 (mg/dL)    BUN 14  6 - 23 (mg/dL)    Creatinine, Ser 4.09  0.50 - 1.35 (mg/dL)    Calcium 9.2  8.4 - 10.5 (mg/dL)    GFR calc non Af Amer 86 (*) >90 (mL/min)    GFR calc Af Amer >90  >90 (mL/min)   CBC     Status: Abnormal   Collection Time   07/02/11  9:26 AM      Component Value Range Comment   WBC 8.0  4.0 - 10.5 (K/uL)    RBC 3.62 (*) 4.22 - 5.81  (MIL/uL)    Hemoglobin 7.8 (*) 13.0 - 17.0 (g/dL)    HCT 81.1 (*) 91.4 - 52.0 (%)    MCV 72.1 (*) 78.0 - 100.0 (fL)    MCH 21.5 (*) 26.0 - 34.0 (pg)    MCHC 29.9 (*) 30.0 - 36.0 (g/dL)    RDW 78.2 (*) 95.6 - 15.5 (%)    Platelets 318  150 - 400 (K/uL)   CARDIAC PANEL(CRET KIN+CKTOT+MB+TROPI)     Status: Normal   Collection Time   07/02/11  9:29 AM      Component Value Range Comment   Total CK 109  7 - 232 (U/L)    CK, MB 2.5  0.3 - 4.0 (ng/mL)    Troponin I <0.30  <0.30 (ng/mL)    Relative Index 2.3  0.0 - 2.5    PREPARE RBC (CROSSMATCH)     Status: Normal   Collection Time   07/02/11  9:51 AM      Component Value Range Comment   Order Confirmation ORDER PROCESSED BY BLOOD BANK     CBC     Status: Abnormal   Collection Time   07/03/11  5:01 AM      Component Value Range Comment   WBC 7.5  4.0 - 10.5 (K/uL)    RBC 4.07 (*) 4.22 - 5.81 (MIL/uL)    Hemoglobin 8.9 (*) 13.0 - 17.0 (g/dL)    HCT 21.3 (*) 08.6 - 52.0 (%)    MCV 72.7 (*) 78.0 - 100.0 (fL)    MCH 21.9 (*) 26.0 - 34.0 (pg)    MCHC 30.1  30.0 - 36.0 (g/dL)    RDW 57.8 (*) 46.9 - 15.5 (%)    Platelets 325  150 - 400 (K/uL)   URINALYSIS, ROUTINE W REFLEX MICROSCOPIC     Status: Normal   Collection Time   07/03/11 10:52 AM      Component Value Range Comment   Color, Urine YELLOW  YELLOW     APPearance CLEAR  CLEAR     Specific Gravity, Urine 1.010  1.005 - 1.030     pH 5.5  5.0 - 8.0     Glucose, UA NEGATIVE  NEGATIVE (mg/dL)    Hgb urine dipstick NEGATIVE  NEGATIVE     Bilirubin Urine NEGATIVE  NEGATIVE     Ketones, ur NEGATIVE  NEGATIVE (mg/dL)    Protein, ur NEGATIVE  NEGATIVE (mg/dL)    Urobilinogen, UA 0.2  0.0 - 1.0 (mg/dL)    Nitrite NEGATIVE  NEGATIVE     Leukocytes, UA NEGATIVE  NEGATIVE  MICROSCOPIC NOT DONE ON URINES WITH NEGATIVE PROTEIN, BLOOD, LEUKOCYTES, NITRITE, OR GLUCOSE <1000 mg/dL.    No results found.  ROS: See chart Blood pressure 136/80,  pulse 64, temperature 97.9 F (36.6 C), temperature  source Oral, resp. rate 16, height 6\' 3"  (1.905 m), weight 113.399 kg (250 lb), SpO2 96.00%. Physical Exam: Well-developed well-nourished white male in no acute distress.  Lungs clear to auscultation with equal breath sounds bilaterally. Heart examination reveals a regular rate and rhythm without S3, S4, murmurs. Abdomen soft, nontender, nondistended. No hepatosplenomegaly or masses noted.  Assessment/Plan: Impression: Cecal carcinoma Plan: Patient is scheduled undergo a laparoscopic hand-assisted partial colectomy tomorrow. The risks and benefits of the procedure including bleeding, infection, possibly of needing further blood were fully explained to the patient, gave informed consent. He localizes this previous laparoscopic hernia repair may make it difficult to do this laparoscopically.  Madie Cahn A 07/03/2011, 6:47 PM

## 2011-07-03 NOTE — Progress Notes (Signed)
Pt c/o not urinating like he was prior to procedure today.  Writer made Dr. Lovell Sheehan aware and received new orders and followed.  Pt does have hx of BPH.

## 2011-07-03 NOTE — Op Note (Signed)
EGD AND COLONOSCOPY PROCEDURE REPORT  PATIENT:  William Burns  MR#:  [id number] Birthdate:  [birth year], 66 y.o., male Endoscopist:  Dr. Malissa Hippo, MD Referred By:  Dr. Elliot Cousin, MD Procedure Date: 07/03/2011  Procedure:   EGD & Colonoscopy  Indications:  Patient is 66 year old Caucasian male who presents with microcytic anemia and causes significant history of melena and he has been on low-dose ASA. Has history of colonic polyps and his last exam was over 20 years ago.            Informed Consent: Both the procedures and risks were reviewed with the patient and informed consent was obtained. Medications:  Demerol 50 mg IV Versed 7 mg IV Cetacaine spray topically for oropharyngeal anesthesia  EGD  Description of procedure:  The endoscope was introduced through the mouth and advanced to the second portion of the duodenum without difficulty or limitations. The mucosal surfaces were surveyed very carefully during advancement of the scope and upon withdrawal.  Findings:  Esophagus:  Mucosa of the esophagus was normal. 2 tongues of salmon-colored mucosa noted. One was short and the other one was long and narrow. Biopsy taken to rule out short segment Barrett's esophagus. GEJ:  42 cm Stomach:  Stomach was empty and distended very well to insufflation. Folds in the proximal stomach were normal. There was long linear erosion and gastric body along the posterior wall and along with a few prepyloric erosions none with stigmata of bleed. Pyloric channel was patent. Annularis fundus and cardia were unremarkable. Duodenum:  Normal bulbar and post bulbar mucosa.   Therapeutic/Diagnostic Maneuvers Performed:  See above.  COLONOSCOPY Description of procedure:  After a digital rectal exam was performed, that colonoscope was advanced from the anus through the rectum and colon to the area of the cecum, ileocecal valve and appendiceal orifice. The cecum was deeply intubated. These structures  were well-seen and photographed for the record. From the level of the cecum and ileocecal valve, the scope was slowly and cautiously withdrawn. The mucosal surfaces were carefully surveyed utilizing scope tip to flexion to facilitate fold flattening as needed. The scope was pulled down into the rectum where a thorough exam including retroflexion was performed.  Findings:   Prep fair with fresh blood in the region of hepatic flexure and ascending colon. Near circumferential mass at cecum involving part of the ileocecal valve. Multiple biopsies taken. Small polyp of a viable biopsy from mid sigmoid colon. 3 small polyps were snared from rectum and submitted in one container. Few more were coagulated using snare tip.  Therapeutic/Diagnostic Maneuvers Performed:  See above  Complications:  None  Cecal Withdrawal Time:  25 minutes  Impression:  Serrated GE junction with 2 tongues of salmon-colored mucosa. Biopsy taken to rule out short segment Barrett. Erosive gastritis. Large mass at cecum involving most of the ileocecal valve. Wall polyp ablated via cold biopsy from the sigmoid colon and 3 small polyps and rectum were snared submitted in one container. Few more small rectal polyps accredited using snare tip.  Recommendations:  H. pylori serology. CEA. Abdominopelvic CT. Surgical consultation.  [name]  07/03/2011 1:31 PM  CC: Dr. Dwana Melena, MD, MD &

## 2011-07-04 ENCOUNTER — Encounter (HOSPITAL_COMMUNITY): Payer: Self-pay | Admitting: Anesthesiology

## 2011-07-04 ENCOUNTER — Encounter (HOSPITAL_COMMUNITY)
Admission: EM | Disposition: A | Discharge status: Home / Self Care | Payer: Self-pay | Source: Home / Self Care | Attending: General Surgery

## 2011-07-04 ENCOUNTER — Inpatient Hospital Stay (HOSPITAL_COMMUNITY): Payer: Medicare Other | Admitting: Anesthesiology

## 2011-07-04 ENCOUNTER — Encounter (HOSPITAL_COMMUNITY): Payer: Self-pay | Admitting: *Deleted

## 2011-07-04 ENCOUNTER — Encounter (HOSPITAL_COMMUNITY): Payer: Self-pay | Admitting: Internal Medicine

## 2011-07-04 ENCOUNTER — Other Ambulatory Visit: Payer: Self-pay | Admitting: General Surgery

## 2011-07-04 DIAGNOSIS — C189 Malignant neoplasm of colon, unspecified: Secondary | ICD-10-CM | POA: Diagnosis not present

## 2011-07-04 DIAGNOSIS — N210 Calculus in bladder: Secondary | ICD-10-CM | POA: Diagnosis not present

## 2011-07-04 HISTORY — PX: PARTIAL COLECTOMY: SHX5273

## 2011-07-04 LAB — CBC
Platelets: 308 10*3/uL (ref 150–400)
RDW: 18.3 % — ABNORMAL HIGH (ref 11.5–15.5)
WBC: 9.9 10*3/uL (ref 4.0–10.5)

## 2011-07-04 LAB — SURGICAL PCR SCREEN: Staphylococcus aureus: NEGATIVE

## 2011-07-04 SURGERY — COLECTOMY, PARTIAL
Anesthesia: General | Wound class: Clean Contaminated

## 2011-07-04 MED ORDER — ONDANSETRON HCL 4 MG/2ML IJ SOLN
4.0000 mg | Freq: Four times a day (QID) | INTRAMUSCULAR | Status: DC | PRN
  Administered 2011-07-05: 4 mg via INTRAVENOUS
  Filled 2011-07-04: qty 2

## 2011-07-04 MED ORDER — NEOSTIGMINE METHYLSULFATE 1 MG/ML IJ SOLN
INTRAMUSCULAR | Status: DC | PRN
  Administered 2011-07-04: 4 mg via INTRAVENOUS

## 2011-07-04 MED ORDER — LACTATED RINGERS IV SOLN
INTRAVENOUS | Status: DC
  Administered 2011-07-04: 1000 mL via INTRAVENOUS
  Administered 2011-07-04: 15:00:00 via INTRAVENOUS

## 2011-07-04 MED ORDER — SODIUM CHLORIDE 0.9 % IV SOLN
1.0000 g | INTRAVENOUS | Status: DC
  Filled 2011-07-04 (×2): qty 1

## 2011-07-04 MED ORDER — GLYCOPYRROLATE 0.2 MG/ML IJ SOLN
INTRAMUSCULAR | Status: AC
  Filled 2011-07-04: qty 2

## 2011-07-04 MED ORDER — ROCURONIUM BROMIDE 50 MG/5ML IV SOLN
INTRAVENOUS | Status: AC
  Filled 2011-07-04: qty 1

## 2011-07-04 MED ORDER — POVIDONE-IODINE 10 % OINT PACKET
TOPICAL_OINTMENT | CUTANEOUS | Status: DC | PRN
  Administered 2011-07-04: 1 via TOPICAL

## 2011-07-04 MED ORDER — LIDOCAINE HCL (PF) 1 % IJ SOLN
INTRAMUSCULAR | Status: AC
  Filled 2011-07-04: qty 5

## 2011-07-04 MED ORDER — TAMSULOSIN HCL 0.4 MG PO CAPS
0.4000 mg | ORAL_CAPSULE | Freq: Every day | ORAL | Status: DC
  Administered 2011-07-05 – 2011-07-09 (×5): 0.4 mg via ORAL
  Filled 2011-07-04 (×5): qty 1

## 2011-07-04 MED ORDER — FENTANYL CITRATE 0.05 MG/ML IJ SOLN
INTRAMUSCULAR | Status: AC
  Administered 2011-07-04: 50 ug via INTRAVENOUS
  Filled 2011-07-04: qty 2

## 2011-07-04 MED ORDER — ALVIMOPAN 12 MG PO CAPS
12.0000 mg | ORAL_CAPSULE | Freq: Two times a day (BID) | ORAL | Status: AC
  Administered 2011-07-05 – 2011-07-11 (×13): 12 mg via ORAL
  Filled 2011-07-04 (×13): qty 1

## 2011-07-04 MED ORDER — HEMOSTATIC AGENTS (NO CHARGE) OPTIME
TOPICAL | Status: DC | PRN
  Administered 2011-07-04: 1 via TOPICAL

## 2011-07-04 MED ORDER — LACTATED RINGERS IV SOLN
INTRAVENOUS | Status: DC
  Administered 2011-07-04 (×2): via INTRAVENOUS

## 2011-07-04 MED ORDER — MORPHINE SULFATE 2 MG/ML IJ SOLN
2.0000 mg | INTRAMUSCULAR | Status: DC | PRN
  Administered 2011-07-04: 2 mg via INTRAVENOUS
  Filled 2011-07-04: qty 1

## 2011-07-04 MED ORDER — FENTANYL CITRATE 0.05 MG/ML IJ SOLN
INTRAMUSCULAR | Status: AC
  Filled 2011-07-04: qty 2

## 2011-07-04 MED ORDER — ENOXAPARIN SODIUM 40 MG/0.4ML ~~LOC~~ SOLN
40.0000 mg | SUBCUTANEOUS | Status: DC
Start: 2011-07-05 — End: 2011-07-12
  Administered 2011-07-05 – 2011-07-12 (×7): 40 mg via SUBCUTANEOUS
  Filled 2011-07-04 (×7): qty 0.4

## 2011-07-04 MED ORDER — ROCURONIUM BROMIDE 100 MG/10ML IV SOLN
INTRAVENOUS | Status: DC | PRN
  Administered 2011-07-04 (×2): 10 mg via INTRAVENOUS
  Administered 2011-07-04: 40 mg via INTRAVENOUS

## 2011-07-04 MED ORDER — LIDOCAINE HCL 1 % IJ SOLN
INTRAMUSCULAR | Status: DC | PRN
  Administered 2011-07-04: 5 mg via INTRADERMAL

## 2011-07-04 MED ORDER — ACETAMINOPHEN 10 MG/ML IV SOLN
INTRAVENOUS | Status: AC
  Administered 2011-07-04: 1000 mg via INTRAVENOUS
  Filled 2011-07-04: qty 100

## 2011-07-04 MED ORDER — MORPHINE SULFATE 4 MG/ML IJ SOLN
4.0000 mg | INTRAMUSCULAR | Status: DC | PRN
  Filled 2011-07-04: qty 1

## 2011-07-04 MED ORDER — ACETAMINOPHEN 10 MG/ML IV SOLN
1000.0000 mg | Freq: Four times a day (QID) | INTRAVENOUS | Status: AC
  Administered 2011-07-04 – 2011-07-05 (×4): 1000 mg via INTRAVENOUS
  Filled 2011-07-04 (×3): qty 100

## 2011-07-04 MED ORDER — POVIDONE-IODINE 10 % EX OINT
TOPICAL_OINTMENT | CUTANEOUS | Status: AC
  Filled 2011-07-04: qty 2

## 2011-07-04 MED ORDER — PANTOPRAZOLE SODIUM 40 MG PO TBEC
40.0000 mg | DELAYED_RELEASE_TABLET | Freq: Every day | ORAL | Status: DC
  Administered 2011-07-05 – 2011-07-10 (×6): 40 mg via ORAL
  Filled 2011-07-04 (×6): qty 1

## 2011-07-04 MED ORDER — MIDAZOLAM HCL 2 MG/2ML IJ SOLN
1.0000 mg | INTRAMUSCULAR | Status: DC | PRN
  Administered 2011-07-04: 2 mg via INTRAVENOUS

## 2011-07-04 MED ORDER — PROPOFOL 10 MG/ML IV BOLUS
INTRAVENOUS | Status: DC | PRN
  Administered 2011-07-04: 200 mg via INTRAVENOUS

## 2011-07-04 MED ORDER — ONDANSETRON HCL 4 MG/2ML IJ SOLN
4.0000 mg | Freq: Once | INTRAMUSCULAR | Status: AC
  Administered 2011-07-04: 4 mg via INTRAVENOUS

## 2011-07-04 MED ORDER — GLYCOPYRROLATE 0.2 MG/ML IJ SOLN
0.2000 mg | Freq: Once | INTRAMUSCULAR | Status: AC
  Administered 2011-07-04: 0.2 mg via INTRAVENOUS

## 2011-07-04 MED ORDER — ENOXAPARIN SODIUM 40 MG/0.4ML ~~LOC~~ SOLN
40.0000 mg | Freq: Once | SUBCUTANEOUS | Status: DC
  Filled 2011-07-04: qty 0.4

## 2011-07-04 MED ORDER — ONDANSETRON HCL 4 MG PO TABS: 4.0000 mg | ORAL_TABLET | Freq: Four times a day (QID) | ORAL | Status: DC | PRN

## 2011-07-04 MED ORDER — FENTANYL CITRATE 0.05 MG/ML IJ SOLN
25.0000 ug | INTRAMUSCULAR | Status: DC | PRN
  Administered 2011-07-04: 50 ug via INTRAVENOUS

## 2011-07-04 MED ORDER — FENTANYL CITRATE 0.05 MG/ML IJ SOLN
50.0000 ug | INTRAMUSCULAR | Status: DC | PRN
  Administered 2011-07-04 (×2): 50 ug via INTRAVENOUS

## 2011-07-04 MED ORDER — EPHEDRINE SULFATE 50 MG/ML IJ SOLN
INTRAMUSCULAR | Status: AC
  Filled 2011-07-04: qty 1

## 2011-07-04 MED ORDER — ALVIMOPAN 12 MG PO CAPS: 12.0000 mg | ORAL_CAPSULE | Freq: Once | ORAL | Status: DC

## 2011-07-04 MED ORDER — GLYCOPYRROLATE 0.2 MG/ML IJ SOLN
INTRAMUSCULAR | Status: DC | PRN
  Administered 2011-07-04: .6 mg via INTRAVENOUS

## 2011-07-04 MED ORDER — ACETAMINOPHEN 325 MG PO TABS: 325.0000 mg | ORAL_TABLET | ORAL | Status: DC | PRN

## 2011-07-04 MED ORDER — SODIUM CHLORIDE 0.9 % IR SOLN
Status: DC | PRN
Start: 2011-07-04 — End: 2011-07-04
  Administered 2011-07-04: 1000 mL

## 2011-07-04 MED ORDER — HYDROMORPHONE HCL PF 1 MG/ML IJ SOLN
1.0000 mg | INTRAMUSCULAR | Status: DC | PRN
  Administered 2011-07-04 – 2011-07-09 (×20): 1 mg via INTRAVENOUS
  Filled 2011-07-04 (×21): qty 1

## 2011-07-04 MED ORDER — FENTANYL CITRATE 0.05 MG/ML IJ SOLN
INTRAMUSCULAR | Status: AC
  Filled 2011-07-04: qty 5

## 2011-07-04 MED ORDER — EPHEDRINE SULFATE 50 MG/ML IJ SOLN
INTRAMUSCULAR | Status: DC | PRN
  Administered 2011-07-04: 10 mg via INTRAVENOUS

## 2011-07-04 MED ORDER — ONDANSETRON HCL 4 MG/2ML IJ SOLN: 4.0000 mg | Freq: Once | INTRAMUSCULAR | Status: DC | PRN

## 2011-07-04 MED ORDER — PROPOFOL 10 MG/ML IV EMUL
INTRAVENOUS | Status: AC
  Filled 2011-07-04: qty 20

## 2011-07-04 MED ORDER — ENOXAPARIN SODIUM 40 MG/0.4ML ~~LOC~~ SOLN
SUBCUTANEOUS | Status: AC
  Administered 2011-07-04: 40 mg via SUBCUTANEOUS
  Filled 2011-07-04: qty 0.4

## 2011-07-04 MED ORDER — FENTANYL CITRATE 0.05 MG/ML IJ SOLN
INTRAMUSCULAR | Status: DC | PRN
  Administered 2011-07-04 (×6): 50 ug via INTRAVENOUS

## 2011-07-04 MED ORDER — ALVIMOPAN 12 MG PO CAPS
ORAL_CAPSULE | ORAL | Status: AC
  Administered 2011-07-04: 12 mg via ORAL
  Filled 2011-07-04: qty 1

## 2011-07-04 SURGICAL SUPPLY — 78 items
BAG HAMPER (MISCELLANEOUS) ×2 IMPLANT
BLADE SURG SZ10 CARB STEEL (BLADE) ×2 IMPLANT
CLOTH BEACON ORANGE TIMEOUT ST (SAFETY) ×2 IMPLANT
COVER LIGHT HANDLE STERIS (MISCELLANEOUS) ×4 IMPLANT
CUTTER ENDO LINEAR 45M (STAPLE) IMPLANT
DECANTER SPIKE VIAL GLASS SM (MISCELLANEOUS) IMPLANT
DRAPE INCISE IOBAN 44X35 STRL (DRAPES) ×2 IMPLANT
DRAPE WARM FLUID 44X44 (DRAPE) ×2 IMPLANT
DURAPREP 26ML APPLICATOR (WOUND CARE) ×2 IMPLANT
ELECT BLADE 6 FLAT ULTRCLN (ELECTRODE) IMPLANT
ELECT REM PT RETURN 9FT ADLT (ELECTROSURGICAL) ×2
ELECTRODE REM PT RTRN 9FT ADLT (ELECTROSURGICAL) ×1 IMPLANT
FILTER SMOKE EVAC LAPAROSHD (FILTER) ×2 IMPLANT
GLOVE BIO SURGEON STRL SZ7.5 (GLOVE) ×2 IMPLANT
GLOVE BIOGEL PI IND STRL 6.5 (GLOVE) ×1 IMPLANT
GLOVE BIOGEL PI IND STRL 7.0 (GLOVE) ×1 IMPLANT
GLOVE BIOGEL PI IND STRL 8.5 (GLOVE) ×1 IMPLANT
GLOVE BIOGEL PI INDICATOR 6.5 (GLOVE) ×1
GLOVE BIOGEL PI INDICATOR 7.0 (GLOVE) ×1
GLOVE BIOGEL PI INDICATOR 8.5 (GLOVE) ×1
GLOVE ECLIPSE 6.5 STRL STRAW (GLOVE) ×4 IMPLANT
GLOVE ECLIPSE 8.0 STRL XLNG CF (GLOVE) ×4 IMPLANT
GLOVE EXAM NITRILE MD LF STRL (GLOVE) ×4 IMPLANT
GLOVE INDICATOR 7.0 STRL GRN (GLOVE) ×4 IMPLANT
GLOVE INDICATOR 8.5 STRL (GLOVE) ×2 IMPLANT
GOWN STRL REIN 3XL LVL4 (GOWN DISPOSABLE) ×2 IMPLANT
GOWN STRL REIN XL XLG (GOWN DISPOSABLE) ×6 IMPLANT
HEMOSTAT SURGICEL 4X8 (HEMOSTASIS) ×2 IMPLANT
INST SET LAPROSCOPIC AP (KITS) ×2 IMPLANT
INST SET MAJOR GENERAL (KITS) ×2 IMPLANT
IV NS IRRIG 3000ML ARTHROMATIC (IV SOLUTION) IMPLANT
KIT ROOM TURNOVER AP CYSTO (KITS) IMPLANT
LIGASURE 5MM LAPAROSCOPIC (INSTRUMENTS) IMPLANT
LIGASURE IMPACT 36 18CM CVD LR (INSTRUMENTS) ×2 IMPLANT
LIGASURE LAP ATLAS 10MM 37CM (INSTRUMENTS) IMPLANT
MANIFOLD NEPTUNE II (INSTRUMENTS) ×2 IMPLANT
NEEDLE HYPO 18GX1.5 BLUNT FILL (NEEDLE) IMPLANT
NS IRRIG 1000ML POUR BTL (IV SOLUTION) ×4 IMPLANT
PACK LAP CHOLE LZT030E (CUSTOM PROCEDURE TRAY) ×2 IMPLANT
PAD ARMBOARD 7.5X6 YLW CONV (MISCELLANEOUS) ×2 IMPLANT
PENCIL HANDSWITCHING (ELECTRODE) ×2 IMPLANT
RELOAD LINEAR CUT PROX 55 BLUE (ENDOMECHANICALS) IMPLANT
RELOAD PROXIMATE 75MM BLUE (ENDOMECHANICALS) ×4 IMPLANT
RELOAD STAPLE TA45 3.5 REG BLU (ENDOMECHANICALS) IMPLANT
SEALER TISSUE G2 CVD JAW 35 (ENDOMECHANICALS) ×1 IMPLANT
SEALER TISSUE G2 CVD JAW 45CM (ENDOMECHANICALS) ×1
SET BASIN LINEN APH (SET/KITS/TRAYS/PACK) ×2 IMPLANT
SET TUBE IRRIG SUCTION NO TIP (IRRIGATION / IRRIGATOR) IMPLANT
SHEET LAVH (DRAPES) IMPLANT
SPONGE GAUZE 2X2 8PLY STRL LF (GAUZE/BANDAGES/DRESSINGS) IMPLANT
SPONGE GAUZE 4X4 12PLY (GAUZE/BANDAGES/DRESSINGS) IMPLANT
SPONGE LAP 18X18 X RAY DECT (DISPOSABLE) ×2 IMPLANT
SPONGE SURGIFOAM ABS GEL 100 (HEMOSTASIS) ×2 IMPLANT
STAPLER GUN LINEAR PROX 60 (STAPLE) ×2 IMPLANT
STAPLER PROXIMATE 55 BLUE (STAPLE) IMPLANT
STAPLER PROXIMATE 75MM BLUE (STAPLE) ×2 IMPLANT
STAPLER VISISTAT (STAPLE) ×2 IMPLANT
SUCTION POOLE TIP (SUCTIONS) ×2 IMPLANT
SUT CHROMIC 0 CT 1 (SUTURE) IMPLANT
SUT CHROMIC 2 0 SH (SUTURE) ×2 IMPLANT
SUT CHROMIC 3 0 SH 27 (SUTURE) ×2 IMPLANT
SUT PDS AB 0 CTX 60 (SUTURE) ×4 IMPLANT
SUT PDS AB CT VIOLET #0 27IN (SUTURE) IMPLANT
SUT SILK 2 0 (SUTURE) ×1
SUT SILK 2-0 18XBRD TIE 12 (SUTURE) ×1 IMPLANT
SUT SILK 3 0 SH CR/8 (SUTURE) ×4 IMPLANT
SUT VIC AB 0 CT1 27 (SUTURE)
SUT VIC AB 0 CT1 27XCR 8 STRN (SUTURE) IMPLANT
SUT VIC AB 2-0 CT2 27 (SUTURE) IMPLANT
SYS LAPSCP GELPORT 120MM (MISCELLANEOUS) ×2
SYS OPTICAL KII 11X100 THREAD (TROCAR) ×2 IMPLANT
SYSTEM LAPSCP GELPORT 120MM (MISCELLANEOUS) ×1 IMPLANT
TAPE CLOTH SURG 4X10 WHT LF (GAUZE/BANDAGES/DRESSINGS) ×2 IMPLANT
TRAY FOLEY CATH 14FR (SET/KITS/TRAYS/PACK) ×2 IMPLANT
TROCAR Z-THREAD OPTICAL 12X100 (TROCAR) ×2 IMPLANT
TUBING HI FLO HEAT INSUFFLATOR (IRRIGATION / IRRIGATOR) ×2 IMPLANT
WARMER LAPAROSCOPE (MISCELLANEOUS) ×2 IMPLANT
YANKAUER SUCT BULB TIP 10FT TU (MISCELLANEOUS) ×2 IMPLANT

## 2011-07-04 NOTE — Op Note (Signed)
Patient:  William Burns  DOB:  26-Dec-1945  MRN:  [id number]   Preop Diagnosis:  Colon carcinoma  Postop Diagnosis:  Same  Procedure:  Right hemicolectomy  Surgeon:  Franky Macho, M.D.  Anes:  General endotracheal  Indications:  Patient is a 66 year old white male who presented to [facility] with severe fatigue secondary to anemia. He was also noted to have blood per. A colonoscopy done by Dr. Karilyn Cota revealed a cecal colon mass. The patient now comes the operating room for a right partial colectomy. He was told that I may try to do this laparoscopically hand-assisted, though this may be difficult to a previously placed laparoscopic ventral mesh. The risks and benefits of the procedure including bleeding, infection, cardiopulmonary difficulties, the possibly of an open procedure were fully explained to the patient, gave informed consent.  Procedure note:  Patient is placed the supine position. After induction of general endotracheal anesthesia, the abdomen was prepped and draped using usual sterile technique with DuraPrep. Surgical site confirmation was performed.  A right paramedian incision was made. This was sized and not to allow GelPort be inserted. This was outside the previously placed mesh. When inspecting the abdomen, a significant amount of omentum was noted to be attached to the mesh. The previously placed mesh was approximately 10 x 15 cm in size. It was felt that this would preclude visualization. It was elected to extend the right paramedian incision superiorly and inferiorly to complete the surgery. The right colon was mobilized along its peritoneal reflection. The hepatic flexure was taken down using the LigaSure. A GIA stapler was placed across the terminal ileum and fired. This was likewise done across the proximal transverse colon. The right colon mesentery was then divided using the LigaSure. The specimen was then removed from the operative field and sent to pathology further  examination. There were some palpable lymph nodes within the specimen removed. A side to side ileocolic anastomosis was then performed using a GIA 70 stapler. The enterotomy was closed using a TA 60 stapler. The staple line was bolstered using 3-0 silk sutures. Omentum was then placed over the anastomosis and secured using 3-0 silk sutures. The mesenteric defect was closed using a 2-0 chromic gut running suture. The abdominal cavity and copious irrigated normal saline. No abnormal bleeding was noted the end of the procedure. Gelfoam and Surgicel were then placed along the right paracolic gutter to help maintain hemostasis.  The fascia was closed using a looped 0 PDS running suture. The subcutaneous layer was irrigated normal saline and the skin was closed using staples. Betadine ointment after dressings were applied.  All tape and needle counts were correct at the end of the procedure. Patient was extubated in the operating room went back to recovery room awake in stable condition.   Complications:  None  EBL:  100 cc  Specimen:  Right colon

## 2011-07-04 NOTE — Addendum Note (Signed)
Addendum  created 07/04/11 1559 by Glynn Octave, CRNA   Modules edited:Anesthesia Medication Administration, Notes Section

## 2011-07-04 NOTE — Transfer of Care (Signed)
Immediate Anesthesia Transfer of Care Note  Patient: William Burns  Procedure(s) Performed:  PARTIAL COLECTOMY  Patient Location: PACU  Anesthesia Type: General  Level of Consciousness: sedated  Airway & Oxygen Therapy: Patient Spontanous Breathing and Patient connected to face mask oxygen  Post-op Assessment: Report given to PACU RN  Post vital signs: Reviewed and stable  Complications: No apparent anesthesia complications

## 2011-07-04 NOTE — Progress Notes (Signed)
Patient refuses in and out catheterization, will continue to monitor.

## 2011-07-04 NOTE — Anesthesia Preprocedure Evaluation (Addendum)
Anesthesia Evaluation  Patient identified by MRN, date of birth, ID band Patient awake    Reviewed: Allergy & Precautions, H&P , NPO status , Patient's Chart, lab work & pertinent test results  Airway Mallampati: II TM Distance: >3 FB Neck ROM: Full   Comment: Glidescope with #4, easily visualized cords.  Unable to visualize with Mac3 or Mac4. Dental  (+) Partial Upper   Pulmonary neg pulmonary ROS,    Pulmonary exam normal       Cardiovascular neg cardio ROS Regular Normal    Neuro/Psych Negative Neurological ROS  Negative Psych ROS   GI/Hepatic negative GI ROS, Neg liver ROS,   Endo/Other  Negative Endocrine ROS  Renal/GU Renal disease (kidney stones)     Musculoskeletal negative musculoskeletal ROS (+)   Abdominal Normal abdominal exam  (+)   Peds  Hematology  (+) Blood dyscrasia, anemia ,   Anesthesia Other Findings   Reproductive/Obstetrics                          Anesthesia Physical Anesthesia Plan  ASA: II  Anesthesia Plan: General   Post-op Pain Management:    Induction: Intravenous  Airway Management Planned: Oral ETT  Additional Equipment:   Intra-op Plan:   Post-operative Plan: Extubation in OR  Informed Consent: I have reviewed the patients History and Physical, chart, labs and discussed the procedure including the risks, benefits and alternatives for the proposed anesthesia with the patient or authorized representative who has indicated his/her understanding and acceptance.   Dental advisory given  Plan Discussed with: CRNA  Anesthesia Plan Comments:         Anesthesia Quick Evaluation

## 2011-07-04 NOTE — Addendum Note (Signed)
Addendum  created 07/04/11 1559 by Glynn Octave, CRNA   Modules edited:Anesthesia Medication Administration

## 2011-07-04 NOTE — Progress Notes (Signed)
Patient complains of urinary urgency, bladder scan results 67 mL in bladder. MD aware, new orders written. Will continue to monitor.

## 2011-07-04 NOTE — Progress Notes (Signed)
Chart reviewed.  Subjective: Complains of severe dysuria, hesitancy, frequency, pressure or since last night. Feels like he is passing a kidney stone. Has had them before.  Objective: Vital signs in last 24 hours: Filed Vitals:   07/03/11 1354 07/03/11 2241 07/04/11 0225 07/04/11 0603  BP: 136/80 148/67 164/72 149/77  Pulse: 64 68 75 78  Temp: 97.9 F (36.6 C) 97 F (36.1 C) 98.1 F (36.7 C) 97.1 F (36.2 C)  TempSrc: Oral Oral Oral Oral  Resp: 16 18 19 18   Height:      Weight:      SpO2: 96% 94% 97% 93%   Weight change: 2.099 kg (4 lb 10.1 oz)  Intake/Output Summary (Last 24 hours) at 07/04/11 1124 Last data filed at 07/04/11 0800  Gross per 24 hour  Intake      0 ml  Output    100 ml  Net   -100 ml   Physical Exam: General: Appears uncomfortable. Lungs clear to auscultation bilaterally without wheeze rhonchi or rales Cardiovascular regular rate rhythm without murmurs gallops rubs Abdomen soft nontender nondistended Extremities no clubbing cyanosis or edema  Lab Results: Basic Metabolic Panel:  Lab 07/02/11 0981 07/02/11 0023 07/01/11 2229  NA 138 141 --  K 4.1 4.4 --  CL 105 106 --  CO2 25 -- 24  GLUCOSE 101* 152* --  BUN 14 17 --  CREATININE 0.91 1.00 --  CALCIUM 9.2 -- 8.7  MG -- -- --  PHOS -- -- --   Liver Function Tests:  Lab 07/01/11 2229  AST 32  ALT 25  ALKPHOS 73  BILITOT 0.3  PROT 6.8  ALBUMIN 3.4*   No results found for this basename: LIPASE:2,AMYLASE:2 in the last 168 hours No results found for this basename: AMMONIA:2 in the last 168 hours CBC:  Lab 07/04/11 0503 07/03/11 0501 07/01/11 2229  WBC 9.9 7.5 --  NEUTROABS -- -- 7.6  HGB 9.1* 8.9* --  HCT 29.6* 29.6* --  MCV 72.7* 72.7* --  PLT 308 325 --   Cardiac Enzymes:  Lab 07/02/11 0929 07/02/11 0159  CKTOTAL 109 142  CKMB 2.5 3.0  CKMBINDEX -- --  TROPONINI <0.30 <0.30   BNP: No results found for this basename: PROBNP:3 in the last 168 hours D-Dimer: No results  found for this basename: DDIMER:2 in the last 168 hours CBG: No results found for this basename: GLUCAP:6 in the last 168 hours Hemoglobin A1C:  Lab 07/02/11 0200  HGBA1C 5.7*   Fasting Lipid Panel: No results found for this basename: CHOL,HDL,LDLCALC,TRIG,CHOLHDL,LDLDIRECT in the last 191 hours Thyroid Function Tests:  Lab 07/02/11 0159  TSH 1.524  T4TOTAL --  FREET4 --  T3FREE --  THYROIDAB --   Coagulation:  Lab 07/02/11 0159  LABPROT 15.5*  INR 1.20   Anemia Panel:  Lab 07/01/11 2229  VITAMINB12 268  FOLATE 10.6  FERRITIN 2*  TIBC 469*  IRON 10*  RETICCTPCT 2.0   Urine Drug Screen: Drugs of Abuse  No results found for this basename: labopia, cocainscrnur, labbenz, amphetmu, thcu, labbarb    Alcohol Level: No results found for this basename: ETH:2 in the last 168 hours Urinalysis:  Lab 07/03/11 1052 07/01/11 2250  COLORURINE YELLOW YELLOW  LABSPEC 1.010 1.020  PHURINE 5.5 6.0  GLUCOSEU NEGATIVE 100*  HGBUR NEGATIVE NEGATIVE  BILIRUBINUR NEGATIVE NEGATIVE  KETONESUR NEGATIVE NEGATIVE  PROTEINUR NEGATIVE NEGATIVE  UROBILINOGEN 0.2 0.2  NITRITE NEGATIVE NEGATIVE  LEUKOCYTESUR NEGATIVE NEGATIVE    Micro Results: Recent  Results (from the past 240 hour(s))  URINE CULTURE     Status: Normal   Collection Time   07/01/11 11:15 PM      Component Value Range Status Comment   Specimen Description URINE, CLEAN CATCH   Final    Special Requests NONE   Final    Culture  Setup Time [id number]   Final    Colony Count NO GROWTH   Final    Culture NO GROWTH   Final    Report Status 07/03/2011 FINAL   Final   SURGICAL PCR SCREEN     Status: Normal   Collection Time   07/03/11  9:45 PM      Component Value Range Status Comment   MRSA, PCR NEGATIVE  NEGATIVE  Final    Staphylococcus aureus NEGATIVE  NEGATIVE  Final    Studies/Results: Ct Abdomen Pelvis W Contrast  07/03/2011  *RADIOLOGY REPORT*  Clinical Data: Abnormal colonoscopy.  Cecal mass.  History of  hernia repair.  History of erosive gastritis.  CT ABDOMEN AND PELVIS WITH CONTRAST  Technique:  Multidetector CT imaging of the abdomen and pelvis was performed following the standard protocol during bolus administration of intravenous contrast.  Contrast: OMNIPAQUE IOHEXOL 300 MG/ML IV SOLN  Comparison: 12/03/2009.  Findings: Dependent atelectasis at the lung bases.  Right middle lobe atelectasis or scarring.  Liver appears within normal limits. Pancreas within normal limits.  The adrenal glands appear normal. Delayed renal excretion of contrast is normal.  Normal appendix is identified in the right pericolic gutter.  There is an exophytic cystic lesion extending off the superior left renal pole compatible with simple cyst.  This measures 18 mm. Small bowel and stomach appear within normal limits.  There is a cecal mass which was not present on the prior comparison exam.  Eccentric cecal wall thickening is present without perforation.  Cecal wall measures up to 15 mm.  This is compatible with primary carcinoma.  There is narrowing of the colon at this point.  On coronal reconstructions, this has an appearance of an apple core lesion.  No bowel obstruction.  Small bowel appears normal.  The ventral hernia repair is present.  The there is no mesenteric adenopathy.  9 mm ileocolic lymph node (image 46 series 2) is not pathologically enlarged but conspicuous.  There is a tiny nodule anterior to the fundus of the stomach (image 32 series 2) measuring 6 mm which is also nonspecific.  Peroneal seeding cannot be excluded.  No definite seeding of the pericolic gutters.  5 mm calcification is present in the dependent urinary bladder, likely representing stone.  There are faint calcifications of the left posterior bladder wall (image 78 series 2) probably representing either small calculi.  Prostatomegaly is present. Question chronic bladder outflow obstruction.  No inguinal adenopathy.  No pelvic sidewall adenopathy is  identified.  No osteolytic lesions are identified.  There is vague sclerosis in the superior left iliac bone (image 53 series 2) which is nonspecific but probably represents a bone island.  Attention on follow-up recommended.  Lumbar spondylosis.  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  IMPRESSION: 1.  Eccentric cecal mass compatible colon carcinoma.  No perforation.  No obstruction.  2.  No pathologically enlarged mesenteric lymph nodes.  Small ileocolic lymph node and nodule anterior to the stomach merit attention on follow-up examination. 3.  5 mm calcification in the urinary bladder probably representing a stone.  High attenuation immediately adjacent probably represent small  calculi layering dependently.  Transitional cell neoplasm is considered less likely. 4.  Prostatomegaly.  Question chronic bladder outflow obstruction. 5.  Ventral hernia repair. 6.  Left upper pole renal cyst.  Original Report Authenticated By: Andreas Newport, M.D.   Scheduled Meds:   . alvimopan  12 mg Oral Once  . enoxaparin  40 mg Subcutaneous Once  . ertapenem  1 g Intravenous 60 min Pre-Op  . meperidine      . midazolam      . pantoprazole (PROTONIX) IV  40 mg Intravenous Q12H  . penicillin v potassium  500 mg Oral Q8H  . sodium chloride      . Tamsulosin HCl  0.4 mg Oral Daily  . DISCONTD: pantoprazole  40 mg Oral Q1200  . DISCONTD: pantoprazole (PROTONIX) IV  40 mg Intravenous Q12H   Continuous Infusions:   . 0.9 % NaCl with KCl 20 mEq / L 950 mL (07/03/11 1046)   PRN Meds:.acetaminophen, acetaminophen, iohexol, morphine injection, ondansetron (ZOFRAN) IV, ondansetron, DISCONTD: butamben-tetracaine-benzocaine, DISCONTD: meperidine, DISCONTD: midazolam, DISCONTD:  morphine injection, DISCONTD: simethicone susp in sterile water 1000 mL irrigation Assessment/Plan: Principal Problem: Iron deficiency anemia anemia: Hemoglobin is stable posttransfusion   Colonic mass: For hemicolectomy today.   Erosive  gastritis: Continue protoniX. twice daily   Calculus, bladder: Have started morphine. Will start Flomax as well. Urinalysis has been negative x2. Bladder scans show no urinary retention. Discussed with Dr. Jerre Simon who recommends no further testing or treatment.   Hx of colonic polyp   BPH (benign prostatic hyperplasia)   Tooth infection: On penicillin  Appreciate consultants.   LOS: 3 days   Malaysha Arlen L 07/04/2011, 11:24 AM

## 2011-07-04 NOTE — Progress Notes (Signed)
Inetta Fermo unable to take report. Will call back.

## 2011-07-04 NOTE — Anesthesia Postprocedure Evaluation (Addendum)
  Anesthesia Post-op Note  Patient: William Burns  Procedure(s) Performed:  PARTIAL COLECTOMY  Patient Location: PACU  Anesthesia Type: General  Level of Consciousness: awake  Airway and Oxygen Therapy: Patient Spontanous Breathing  Post-op Pain: mild fentanyl, IV given in PACU  Post-op Assessment: Post-op Vital signs reviewed, Patient's Cardiovascular Status Stable, Respiratory Function Stable and No signs of Nausea or vomiting  Post-op Vital Signs: Reviewed and stable  Complications: No apparent anesthesia complications, anticipated patient may be a little difficult with intubation, but glidescope available, O2 sats remained 98-100 and VSS. 07/04/10 Patient doing well, VSS and denies recall or PONV.  No apparent anesthesia complications.

## 2011-07-04 NOTE — Anesthesia Procedure Notes (Signed)
Procedure Name: Intubation Date/Time: 07/04/2011 2:02 PM Performed by: Glynn Octave Pre-anesthesia Checklist: Patient identified, Emergency Drugs available, Suction available, Patient being monitored and Timeout performed Patient Re-evaluated:Patient Re-evaluated prior to inductionOxygen Delivery Method: Circle System Utilized Preoxygenation: Pre-oxygenation with 100% oxygen Intubation Type: IV induction Ventilation: Mask ventilation without difficulty Laryngoscope Size: Mac, 3 and 4 Grade View: Grade III Tube size: 7.0 mm Number of attempts: 3 Placement Confirmation: positive ETCO2,  ETT inserted through vocal cords under direct vision and breath sounds checked- equal and bilateral Secured at: 25 cm Tube secured with: Tape Dental Injury: Teeth and Oropharynx as per pre-operative assessment  Difficulty Due To: Difficulty was anticipated and Difficult Airway- due to anterior larynx Future Recommendations: Recommend- induction with short-acting agent, and alternative techniques readily available Comments: Anticipated laryngoscopy could be difficult, glidescope available. Attempted with Mac 3 and felt Mac4 would be adequate. Still unable to visualize arytenoids.  Glidescope with #4 cover utilized and easily visiualized vocal cords, # 7 ETT passed without difficulty.

## 2011-07-05 LAB — CBC
MCH: 21.8 pg — ABNORMAL LOW (ref 26.0–34.0)
MCHC: 29.7 g/dL — ABNORMAL LOW (ref 30.0–36.0)
Platelets: 302 10*3/uL (ref 150–400)
RBC: 3.81 MIL/uL — ABNORMAL LOW (ref 4.22–5.81)
RDW: 19.1 % — ABNORMAL HIGH (ref 11.5–15.5)

## 2011-07-05 LAB — BASIC METABOLIC PANEL
Calcium: 8.8 mg/dL (ref 8.4–10.5)
Chloride: 104 mEq/L (ref 96–112)
Creatinine, Ser: 0.96 mg/dL (ref 0.50–1.35)
GFR calc Af Amer: >90 mL/min (ref >90)
GFR calc non Af Amer: 84 mL/min — ABNORMAL LOW (ref >90)

## 2011-07-05 LAB — CEA
CEA: 1.1 ng/mL (ref 0.0–5.0)
CEA: 0.9 ng/mL (ref 0.0–5.0)

## 2011-07-05 LAB — MAGNESIUM: Magnesium: 2.1 mg/dL (ref 1.5–2.5)

## 2011-07-05 MED ORDER — KCL IN DEXTROSE-NACL 20-5-0.45 MEQ/L-%-% IV SOLN
INTRAVENOUS | Status: DC
  Administered 2011-07-05 – 2011-07-09 (×6): via INTRAVENOUS

## 2011-07-05 MED ORDER — ACETAMINOPHEN 10 MG/ML IV SOLN
1000.0000 mg | Freq: Four times a day (QID) | INTRAVENOUS | Status: AC
  Administered 2011-07-05 – 2011-07-06 (×4): 1000 mg via INTRAVENOUS
  Filled 2011-07-05 (×3): qty 100

## 2011-07-05 NOTE — Progress Notes (Signed)
1 Day Post-Op  Subjective: Minimal incisional pain. Patient comfortable.  Objective: Vital signs in last 24 hours: Temp:  [97.1 F (36.2 C)-98.2 F (36.8 C)] 98.1 F (36.7 C) (02/02 0637) Pulse Rate:  [68-86] 73  (02/02 0637) Resp:  [13-25] 18  (02/02 0637) BP: (119-173)/(54-116) 124/70 mmHg (02/02 0637) SpO2:  [92 %-100 %] 95 % (02/02 0637) Last BM Date: 07/04/11  Intake/Output from previous day: 02/01 0701 - 02/02 0700 In: 4022 [P.O.:360; I.V.:3462; IV Piggyback:200] Out: 1900 [Urine:1850; Blood:50] Intake/Output this shift:    General appearance: alert, cooperative and no distress Resp: clear to auscultation bilaterally Cardio: regular rate and rhythm, S1, S2 normal, no murmur, click, rub or gallop GI: Soft, flat. Dressing dry and intact.  Lab Results:   Basename 07/05/11 0548 07/04/11 0503  WBC 11.1* 9.9  HGB 8.3* 9.1*  HCT 27.9* 29.6*  PLT 302 308   BMET  Basename 07/05/11 0548  NA 137  K 4.0  CL 104  CO2 26  GLUCOSE 136*  BUN 18  CREATININE 0.96  CALCIUM 8.8   PT/INR No results found for this basename: LABPROT:2,INR:2 in the last 72 hours  Studies/Results: Ct Abdomen Pelvis W Contrast  07/03/2011  *RADIOLOGY REPORT*  Clinical Data: Abnormal colonoscopy.  Cecal mass.  History of hernia repair.  History of erosive gastritis.  CT ABDOMEN AND PELVIS WITH CONTRAST  Technique:  Multidetector CT imaging of the abdomen and pelvis was performed following the standard protocol during bolus administration of intravenous contrast.  Contrast: OMNIPAQUE IOHEXOL 300 MG/ML IV SOLN  Comparison: 12/03/2009.  Findings: Dependent atelectasis at the lung bases.  Right middle lobe atelectasis or scarring.  Liver appears within normal limits. Pancreas within normal limits.  The adrenal glands appear normal. Delayed renal excretion of contrast is normal.  Normal appendix is identified in the right pericolic gutter.  There is an exophytic cystic lesion extending off the  superior left renal pole compatible with simple cyst.  This measures 18 mm. Small bowel and stomach appear within normal limits.  There is a cecal mass which was not present on the prior comparison exam.  Eccentric cecal wall thickening is present without perforation.  Cecal wall measures up to 15 mm.  This is compatible with primary carcinoma.  There is narrowing of the colon at this point.  On coronal reconstructions, this has an appearance of an apple core lesion.  No bowel obstruction.  Small bowel appears normal.  The ventral hernia repair is present.  The there is no mesenteric adenopathy.  9 mm ileocolic lymph node (image 46 series 2) is not pathologically enlarged but conspicuous.  There is a tiny nodule anterior to the fundus of the stomach (image 32 series 2) measuring 6 mm which is also nonspecific.  Peroneal seeding cannot be excluded.  No definite seeding of the pericolic gutters.  5 mm calcification is present in the dependent urinary bladder, likely representing stone.  There are faint calcifications of the left posterior bladder wall (image 78 series 2) probably representing either small calculi.  Prostatomegaly is present. Question chronic bladder outflow obstruction.  No inguinal adenopathy.  No pelvic sidewall adenopathy is identified.  No osteolytic lesions are identified.  There is vague sclerosis in the superior left iliac bone (image 53 series 2) which is nonspecific but probably represents a bone island.  Attention on follow-up recommended.  Lumbar spondylosis.  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  IMPRESSION: 1.  Eccentric cecal mass compatible colon carcinoma.  No perforation.  No obstruction.  2.  No pathologically enlarged mesenteric lymph nodes.  Small ileocolic lymph node and nodule anterior to the stomach merit attention on follow-up examination. 3.  5 mm calcification in the urinary bladder probably representing a stone.  High attenuation immediately adjacent probably  represent small calculi layering dependently.  Transitional cell neoplasm is considered less likely. 4.  Prostatomegaly.  Question chronic bladder outflow obstruction. 5.  Ventral hernia repair. 6.  Left upper pole renal cyst.  Original Report Authenticated By: Andreas Newport, M.D.    Anti-infectives: Anti-infectives     Start     Dose/Rate Route Frequency Ordered Stop   07/04/11 1300   ertapenem (INVANZ) 1 g in sodium chloride 0.9 % 50 mL IVPB  Status:  Discontinued        1 g 100 mL/hr over 30 Minutes Intravenous 60 min pre-op 07/04/11 1140 07/04/11 1652   07/04/11 1100   ertapenem (INVANZ) 1 g in sodium chloride 0.9 % 50 mL IVPB  Status:  Discontinued        1 g 100 mL/hr over 30 Minutes Intravenous 60 min pre-op 07/03/11 1859 07/04/11 1140   07/02/11 1630   penicillin v potassium (VEETID) tablet 500 mg     Comments: Please give first dose at 10 PM; note he has been taking this medicine for tooth abscess.      500 mg Oral 3 times per day 07/02/11 1622     07/02/11 0200   ampicillin-sulbactam (UNASYN) 1.5 g in sodium chloride 0.9 % 50 mL IVPB  Status:  Discontinued        1.5 g 100 mL/hr over 30 Minutes Intravenous Every 6 hours 07/02/11 0158 07/02/11 0946          Assessment/Plan: s/p Procedure(s): PARTIAL COLECTOMY Impression: Stable on postoperative day one. Labs are within normal limits. Plan: Will increase diet as tolerated. Will adjust IV fluids. We'll start ambulating. Check labs tomorrow in a.m.  LOS: 4 days    William Burns A 07/05/2011

## 2011-07-05 NOTE — Addendum Note (Signed)
Addendum  created 07/05/11 1255 by Glynn Octave, CRNA   Modules edited:Notes Section

## 2011-07-05 NOTE — Progress Notes (Signed)
Discussed with Dr. Lovell Sheehan. He kindly accepts the patient on his service. Will sign off. Call if needed.

## 2011-07-06 LAB — BASIC METABOLIC PANEL
Calcium: 8.6 mg/dL (ref 8.4–10.5)
GFR calc Af Amer: >90 mL/min (ref >90)
GFR calc non Af Amer: 89 mL/min — ABNORMAL LOW (ref >90)
Potassium: 3.9 mEq/L (ref 3.5–5.1)
Sodium: 136 mEq/L (ref 135–145)

## 2011-07-06 LAB — MAGNESIUM: Magnesium: 2.1 mg/dL (ref 1.5–2.5)

## 2011-07-06 LAB — CBC
MCHC: 29.6 g/dL — ABNORMAL LOW (ref 30.0–36.0)
RDW: 19.4 % — ABNORMAL HIGH (ref 11.5–15.5)

## 2011-07-06 MED ORDER — SODIUM CHLORIDE 0.9 % IJ SOLN
INTRAMUSCULAR | Status: AC
  Administered 2011-07-06: 20:00:00
  Filled 2011-07-06: qty 6

## 2011-07-06 NOTE — Progress Notes (Signed)
2 Days Post-Op  Subjective: Depressed. No significant pain. No flatus or bowel movement yet.  Objective: Vital signs in last 24 hours: Temp:  [97.9 F (36.6 C)-98.5 F (36.9 C)] 98.2 F (36.8 C) (02/03 9604) Pulse Rate:  [73-81] 78  (02/03 0613) Resp:  [18-20] 20  (02/03 0613) BP: (121-130)/(63-71) 121/70 mmHg (02/03 0613) SpO2:  [90 %-97 %] 97 % (02/03 0833) Last BM Date: 07/04/11  Intake/Output from previous day: 02/02 0701 - 02/03 0700 In: 4085.1 [P.O.:1680; I.V.:2005.1; IV Piggyback:400] Out: 1875 [Urine:1875] Intake/Output this shift: Total I/O In: 480 [P.O.:480] Out: -   General appearance: alert, cooperative and no distress Resp: clear to auscultation bilaterally Cardio: regular rate and rhythm, S1, S2 normal, no murmur, click, rub or gallop GI: Soft. Incisions healing well. No bowel sounds heard.  Lab Results:   Basename 07/06/11 0425 07/05/11 0548  WBC 10.2 11.1*  HGB 8.1* 8.3*  HCT 27.4* 27.9*  PLT 289 302   BMET  Basename 07/06/11 0425 07/05/11 0548  NA 136 137  K 3.9 4.0  CL 102 104  CO2 28 26  GLUCOSE 131* 136*  BUN 15 18  CREATININE 0.85 0.96  CALCIUM 8.6 8.8   PT/INR No results found for this basename: LABPROT:2,INR:2 in the last 72 hours  Studies/Results: No results found.  Anti-infectives: Anti-infectives     Start     Dose/Rate Route Frequency Ordered Stop   07/04/11 1300   ertapenem (INVANZ) 1 g in sodium chloride 0.9 % 50 mL IVPB  Status:  Discontinued        1 g 100 mL/hr over 30 Minutes Intravenous 60 min pre-op 07/04/11 1140 07/04/11 1652   07/04/11 1100   ertapenem (INVANZ) 1 g in sodium chloride 0.9 % 50 mL IVPB  Status:  Discontinued        1 g 100 mL/hr over 30 Minutes Intravenous 60 min pre-op 07/03/11 1859 07/04/11 1140   07/02/11 1630   penicillin v potassium (VEETID) tablet 500 mg     Comments: Please give first dose at 10 PM; note he has been taking this medicine for tooth abscess.      500 mg Oral 3 times per day  07/02/11 1622     07/02/11 0200   ampicillin-sulbactam (UNASYN) 1.5 g in sodium chloride 0.9 % 50 mL IVPB  Status:  Discontinued        1.5 g 100 mL/hr over 30 Minutes Intravenous Every 6 hours 07/02/11 0158 07/02/11 0946          Assessment/Plan: s/p Procedure(s): PARTIAL COLECTOMY Impression: Stable postoperative day 2. Awaiting return of bowel function. He is otherwise doing well.  LOS: 5 days    Aspen Lawrance A 07/06/2011

## 2011-07-06 NOTE — Progress Notes (Signed)
Operative findings noted. Patient had right, colectomy for cecal mass 2 days ago. Preop CEA at 1.1. He is tolerating clear liquids. H&H on this morning is 8.1 and 27.4 Serum B12 was 268 which is normal. Folate also normal at 10.6 Serum iron 10, TIBC 469 and saturation 2%. Serum ferritin was 2 ng/mL. Assessment; Patient is doing well status post right hemicolectomy for cecal mass 2 days ago. Final path is pending. Hemoglobin low but stable. He will need oral iron therapy when he goes home. H. pylori serology needs to be done.

## 2011-07-06 NOTE — Progress Notes (Signed)
In and out cath with 650 urine output. Small stone one half the size of a pencil tip passed.

## 2011-07-06 NOTE — Plan of Care (Signed)
Problem: Phase III Progression Outcomes Goal: Foley discontinued Outcome: Completed/Met Date Met:  07/06/11 Foley was d/c'd this morning.

## 2011-07-07 LAB — CBC
Hemoglobin: 8 g/dL — ABNORMAL LOW (ref 13.0–17.0)
MCH: 21.9 pg — ABNORMAL LOW (ref 26.0–34.0)
MCHC: 29.7 g/dL — ABNORMAL LOW (ref 30.0–36.0)
MCV: 73.5 fL — ABNORMAL LOW (ref 78.0–100.0)
RBC: 3.66 MIL/uL — ABNORMAL LOW (ref 4.22–5.81)

## 2011-07-07 LAB — BASIC METABOLIC PANEL
BUN: 12 mg/dL (ref 6–23)
CO2: 27 mEq/L (ref 19–32)
Calcium: 8.7 mg/dL (ref 8.4–10.5)
Creatinine, Ser: 0.78 mg/dL (ref 0.50–1.35)
GFR calc non Af Amer: >90 mL/min (ref >90)
Glucose, Bld: 109 mg/dL — ABNORMAL HIGH (ref 70–99)
Sodium: 135 mEq/L (ref 135–145)

## 2011-07-07 MED ORDER — OXYCODONE-ACETAMINOPHEN 5-325 MG PO TABS
1.0000 | ORAL_TABLET | ORAL | Status: DC | PRN
  Administered 2011-07-07: 1 via ORAL
  Administered 2011-07-07 – 2011-07-12 (×11): 2 via ORAL
  Filled 2011-07-07 (×5): qty 2
  Filled 2011-07-07: qty 1
  Filled 2011-07-07 (×6): qty 2

## 2011-07-07 NOTE — Progress Notes (Signed)
3 Days Post-Op  Subjective: Progressing well from the gastrointestinal standpoint. He is still having some difficulties with voiding.  Objective: Vital signs in last 24 hours: Temp:  [98.3 F (36.8 C)-99.3 F (37.4 C)] 98.8 F (37.1 C) (02/04 0514) Pulse Rate:  [80-93] 86  (02/04 0514) Resp:  [20] 20  (02/04 0514) BP: (130-144)/(67-69) 133/67 mmHg (02/04 0514) SpO2:  [92 %-96 %] 94 % (02/04 0514) Last BM Date: 07/07/11  Intake/Output from previous day: 02/03 0701 - 02/04 0700 In: 2955 [P.O.:720; I.V.:2235] Out: 1725 [Urine:1725] Intake/Output this shift:    General appearance: alert, cooperative and no distress Resp: clear to auscultation bilaterally Cardio: regular rate and rhythm, S1, S2 normal, no murmur, click, rub or gallop GI: soft, non-tender; bowel sounds normal; no masses,  no organomegaly. Incision healing well.  Lab Results:   Basename 07/07/11 0423 07/06/11 0425  WBC 9.6 10.2  HGB 8.0* 8.1*  HCT 26.9* 27.4*  PLT 296 289   BMET  Basename 07/07/11 0423 07/06/11 0425  NA 135 136  K 4.0 3.9  CL 102 102  CO2 27 28  GLUCOSE 109* 131*  BUN 12 15  CREATININE 0.78 0.85  CALCIUM 8.7 8.6   PT/INR No results found for this basename: LABPROT:2,INR:2 in the last 72 hours  Studies/Results: No results found.  Anti-infectives: Anti-infectives     Start     Dose/Rate Route Frequency Ordered Stop   07/04/11 1300   ertapenem (INVANZ) 1 g in sodium chloride 0.9 % 50 mL IVPB  Status:  Discontinued        1 g 100 mL/hr over 30 Minutes Intravenous 60 min pre-op 07/04/11 1140 07/04/11 1652   07/04/11 1100   ertapenem (INVANZ) 1 g in sodium chloride 0.9 % 50 mL IVPB  Status:  Discontinued        1 g 100 mL/hr over 30 Minutes Intravenous 60 min pre-op 07/03/11 1859 07/04/11 1140   07/02/11 1630   penicillin v potassium (VEETID) tablet 500 mg     Comments: Please give first dose at 10 PM; note he has been taking this medicine for tooth abscess.      500 mg Oral 3  times per day 07/02/11 1622     07/02/11 0200   ampicillin-sulbactam (UNASYN) 1.5 g in sodium chloride 0.9 % 50 mL IVPB  Status:  Discontinued        1.5 g 100 mL/hr over 30 Minutes Intravenous Every 6 hours 07/02/11 0158 07/02/11 0946          Assessment/Plan: s/p Procedure(s): PARTIAL COLECTOMY Impression: Bowel function has returned. He is tolerating by mouth well. He is still having some difficulty with voiding which is secondary to benign prostatic hypertrophy. We'll continue straight cath in and out for now. He is on Flomax. Anticipate discharge in next 24-48 hours. Anemia is stable.  LOS: 6 days    Tallan Sandoz A 07/07/2011

## 2011-07-08 LAB — CBC
HCT: 27.1 % — ABNORMAL LOW (ref 39.0–52.0)
MCH: 21.6 pg — ABNORMAL LOW (ref 26.0–34.0)
MCHC: 29.5 g/dL — ABNORMAL LOW (ref 30.0–36.0)
MCV: 73 fL — ABNORMAL LOW (ref 78.0–100.0)
Platelets: 280 10*3/uL (ref 150–400)
RDW: 19.4 % — ABNORMAL HIGH (ref 11.5–15.5)
WBC: 7.5 10*3/uL (ref 4.0–10.5)

## 2011-07-08 NOTE — Progress Notes (Signed)
In and Out cath did on patient with 700 cc's of amber urine to return,patient tolerated procedure well without difficulty.

## 2011-07-08 NOTE — Progress Notes (Signed)
4 Days Post-Op  Subjective: Still having some urinary retention. Otherwise, progressing well.  Objective: Vital signs in last 24 hours: Temp:  [98 F (36.7 C)-98.4 F (36.9 C)] 98 F (36.7 C) (02/05 0634) Pulse Rate:  [74-85] 74  (02/05 0634) Resp:  [18-20] 20  (02/05 0634) BP: (129-141)/(66) 129/66 mmHg (02/05 0634) SpO2:  [90 %-93 %] 93 % (02/05 0634) Weight:  [112.356 kg (247 lb 11.2 oz)] 112.356 kg (247 lb 11.2 oz) (02/05 0634) Last BM Date: 07/07/11  Intake/Output from previous day: 02/04 0701 - 02/05 0700 In: 1083.8 [P.O.:480; I.V.:603.8] Out: 350 [Urine:350] Intake/Output this shift:    General appearance: alert and cooperative Resp: clear to auscultation bilaterally Cardio: regular rate and rhythm, S1, S2 normal, no murmur, click, rub or gallop GI: Soft. Incision healing well. Good bowel sounds heard.  Lab Results:   [facility] 07/08/11 0508 07/07/11 0423  WBC 7.5 9.6  HGB 8.0* 8.0*  HCT 27.1* 26.9*  PLT 280 296   BMET  Basename 07/07/11 0423 07/06/11 0425  NA 135 136  K 4.0 3.9  CL 102 102  CO2 27 28  GLUCOSE 109* 131*  BUN 12 15  CREATININE 0.78 0.85  CALCIUM 8.7 8.6   PT/INR No results found for this basename: LABPROT:2,INR:2 in the last 72 hours  Studies/Results: No results found.  Anti-infectives: Anti-infectives     Start     Dose/Rate Route Frequency Ordered Stop   07/04/11 1300   ertapenem (INVANZ) 1 g in sodium chloride 0.9 % 50 mL IVPB  Status:  Discontinued        1 g 100 mL/hr over 30 Minutes Intravenous 60 min pre-op 07/04/11 1140 07/04/11 1652   07/04/11 1100   ertapenem (INVANZ) 1 g in sodium chloride 0.9 % 50 mL IVPB  Status:  Discontinued        1 g 100 mL/hr over 30 Minutes Intravenous 60 min pre-op 07/03/11 1859 07/04/11 1140   07/02/11 1630   penicillin v potassium (VEETID) tablet 500 mg     Comments: Please give first dose at 10 PM; note he has been taking this medicine for tooth abscess.      500 mg Oral 3 times per day  07/02/11 1622     07/02/11 0200   ampicillin-sulbactam (UNASYN) 1.5 g in sodium chloride 0.9 % 50 mL IVPB  Status:  Discontinued        1.5 g 100 mL/hr over 30 Minutes Intravenous Every 6 hours 07/02/11 0158 07/02/11 0946          Assessment/Plan: s/p Procedure(s): PARTIAL COLECTOMY Impression: Still having some urinary retention secondary to BPH. We'll consult Dr. Jerre Simon of urology. Final pathology is still pending. Anticipate discharge in next 24-48 hours.  LOS: 7 days    William Burns 07/08/2011

## 2011-07-08 NOTE — Consult Note (Signed)
Report#[id number]

## 2011-07-09 ENCOUNTER — Encounter (HOSPITAL_COMMUNITY): Payer: Self-pay | Admitting: General Surgery

## 2011-07-09 ENCOUNTER — Encounter (HOSPITAL_COMMUNITY)
Admission: EM | Disposition: A | Discharge status: Home / Self Care | Payer: Self-pay | Source: Home / Self Care | Attending: General Surgery

## 2011-07-09 HISTORY — PX: CYSTOSCOPY: SHX5120

## 2011-07-09 SURGERY — CYSTOSCOPY, FLEXIBLE
Anesthesia: LOCAL | Site: Bladder | Wound class: Clean Contaminated

## 2011-07-09 MED ORDER — LIDOCAINE HCL (CARDIAC) 20 MG/ML IV SOLN
INTRAVENOUS | Status: AC
  Filled 2011-07-09: qty 5

## 2011-07-09 MED ORDER — SODIUM CHLORIDE 0.9 % IR SOLN
Status: DC | PRN
  Administered 2011-07-09: 3000 mL

## 2011-07-09 MED ORDER — LIDOCAINE HCL 2 % EX GEL
CUTANEOUS | Status: DC | PRN
  Administered 2011-07-09: 1

## 2011-07-09 SURGICAL SUPPLY — 18 items
CATH ROBINSON RED A/P 14FR (CATHETERS) ×2 IMPLANT
CLOTH BEACON ORANGE TIMEOUT ST (SAFETY) ×2 IMPLANT
DRAPE PROXIMA HALF (DRAPES) ×4 IMPLANT
GLOVE BIO SURGEON STRL SZ7 (GLOVE) ×2 IMPLANT
GLOVE ECLIPSE 6.5 STRL STRAW (GLOVE) ×2 IMPLANT
GLOVE EXAM NITRILE MD LF STRL (GLOVE) ×2 IMPLANT
GLOVE INDICATOR 7.0 STRL GRN (GLOVE) ×2 IMPLANT
GOWN STRL REIN XL XLG (GOWN DISPOSABLE) ×4 IMPLANT
IV NS IRRIG 3000ML ARTHROMATIC (IV SOLUTION) ×2 IMPLANT
MANIFOLD NEPTUNE WASTE (CANNULA) ×2 IMPLANT
MARKER SKIN DUAL TIP RULER LAB (MISCELLANEOUS) ×2 IMPLANT
SET IRRIG Y TYPE TUR BLADDER L (SET/KITS/TRAYS/PACK) ×2 IMPLANT
SPONGE GAUZE 4X4 12PLY (GAUZE/BANDAGES/DRESSINGS) ×2 IMPLANT
TOWEL OR 17X26 4PK STRL BLUE (TOWEL DISPOSABLE) ×2 IMPLANT
TRAY FOLEY CATH 14FR (SET/KITS/TRAYS/PACK) ×2 IMPLANT
VALVE DISPOSABLE (MISCELLANEOUS) ×2 IMPLANT
WATER STERILE IRR 1000ML POUR (IV SOLUTION) ×2 IMPLANT
YANKAUER SUCT BULB TIP 10FT TU (MISCELLANEOUS) ×2 IMPLANT

## 2011-07-09 NOTE — Brief Op Note (Signed)
07/01/2011 - 07/09/2011  1:37 PM  PATIENT:  William Burns  66 y.o. male  PRE-OPERATIVE DIAGNOSIS:  bladder obstruction  POST-OPERATIVE DIAGNOSIS:  bladder calculi, bph  PROCEDURE:  Procedure(s): CYSTOSCOPY FLEXIBLE  SURGEON:  Surgeon(s): Ky Barban, MD  PHYSICIAN ASSISTANT:   ASSISTANTS: none   ANESTHESIA:   local  EBL:  Total I/O In: 240 [P.O.:240] Out: 450 [Urine:450]  BLOOD ADMINISTERED:none  DRAINS: none   LOCAL MEDICATIONS USED:  NONE  SPECIMEN:  No Specimen  DISPOSITION OF SPECIMEN:  N/A  COUNTS:  YES  TOURNIQUET:  * No tourniquets in log *  DICTATION: [id number]  PLAN OF CARE: he is already  inpatient  PATIENT DISPOSITION:  PACU - hemodynamically stable.   Delay start of Pharmacological VTE agent (>24hrs) due to surgical blood loss or risk of bleeding:YES

## 2011-07-09 NOTE — Progress Notes (Signed)
Brief Nutrition Note:  Reason: Length of Stay  Ht: 75" Wt: 247 lb/ 112.3 kg BMI: 30.96  Reviewed pt chart. Pt is s/p right hemicolectomy for colon carcinoma 5 days ago. Pt currently NPO for a cystoscopy today. Previously pt on Regular diet with documented PO intake 75-100%. Discharge is anticipated in 24-48 hrs per physician note. No nutritional intervention indicated at this time.  Karenann Cai Pager # [phone]

## 2011-07-09 NOTE — Progress Notes (Signed)
5 Days Post-Op  Subjective: Still having difficulty voiding. He is about to undergo a cystoscopy today by Dr. Jerre Simon of urology. He is tolerating a regular diet well and is having normal bowel movements.  Objective: Vital signs in last 24 hours: Temp:  [98.3 F (36.8 C)-98.7 F (37.1 C)] 98.3 F (36.8 C) (02/06 0534) Pulse Rate:  [75-86] 75  (02/06 0534) Resp:  [18-20] 20  (02/06 0534) BP: (131-150)/(66-71) 132/71 mmHg (02/06 0534) SpO2:  [92 %-95 %] 92 % (02/06 0534) Last BM Date: 07/08/11  Intake/Output from previous day: 02/05 0701 - 02/06 0700 In: 1020 [P.O.:780; I.V.:240] Out: 1550 [Urine:1550] Intake/Output this shift: Total I/O In: -  Out: 450 [Urine:450]  General appearance: alert, cooperative and no distress GI: Soft, flat. Incision healing well. Good bowel sounds heard.  Lab Results:   Basename 07/08/11 0508 07/07/11 0423  WBC 7.5 9.6  HGB 8.0* 8.0*  HCT 27.1* 26.9*  PLT 280 296   BMET  Basename 07/07/11 0423  NA 135  K 4.0  CL 102  CO2 27  GLUCOSE 109*  BUN 12  CREATININE 0.78  CALCIUM 8.7   PT/INR No results found for this basename: LABPROT:2,INR:2 in the last 72 hours  Studies/Results: No results found.  Anti-infectives: Anti-infectives     Start     Dose/Rate Route Frequency Ordered Stop   07/04/11 1300   ertapenem (INVANZ) 1 g in sodium chloride 0.9 % 50 mL IVPB  Status:  Discontinued        1 g 100 mL/hr over 30 Minutes Intravenous 60 min pre-op 07/04/11 1140 07/04/11 1652   07/04/11 1100   ertapenem (INVANZ) 1 g in sodium chloride 0.9 % 50 mL IVPB  Status:  Discontinued        1 g 100 mL/hr over 30 Minutes Intravenous 60 min pre-op 07/03/11 1859 07/04/11 1140   07/02/11 1630   penicillin v potassium (VEETID) tablet 500 mg     Comments: Please give first dose at 10 PM; note he has been taking this medicine for tooth abscess.      500 mg Oral 3 times per day 07/02/11 1622     07/02/11 0200   ampicillin-sulbactam (UNASYN) 1.5 g in  sodium chloride 0.9 % 50 mL IVPB  Status:  Discontinued        1.5 g 100 mL/hr over 30 Minutes Intravenous Every 6 hours 07/02/11 0158 07/02/11 0946          Assessment/Plan: s/p Procedure(s): PARTIAL COLECTOMY Impression: Final pathology reveals a T3, N0, M0 adenocarcinoma of the right colon. Patient has been made aware of the diagnosis. He will undergo cystoscopy today. Anticipate discharge in next 24-48 hours.  LOS: 8 days    Myah Guynes A 07/09/2011

## 2011-07-09 NOTE — OR Nursing (Signed)
Pre-op vital signs 1315 BP 152/80 P 77 O2 96%  periop vital signs 1327 BP 176/84 P 85 O2 98%  Post op vital signs 1329 BP 157/75 P 82 O2 96%

## 2011-07-09 NOTE — Consult Note (Signed)
William Burns, William Burns               ACCOUNT NO.:  [id number]  MEDICAL RECORD NO.:  [id number]  LOCATION:  A316                          FACILITY:  [facility]  PHYSICIAN:  Ky Barban, M.D.DATE OF BIRTH:  1945-11-28  DATE OF CONSULTATION: DATE OF DISCHARGE:                                CONSULTATION   CHIEF COMPLAINT:  Difficulty to urinate.  HISTORY:  A 66 year old Caucasian male who was primarily admitted with anemia is found to have cancer in the cecum.  He underwent right colectomy last Friday.  He is also having symptoms of prostatism.  His family physician, Dr. Catalina Pizza treated him with Flomax for few weeks but then he was having pain in his right lower quadrant.  It was discontinued and now postoperatively, he is having more difficulty to void and a CT scan which was done preoperatively which detected that he has a mass in the cecum, but it also found that the kidneys are fine, but there is a stone about 5 mm in size in the bladder, large prostatic gland.  Since his surgery, they are doing in-and-out catheterization because he is in urinary retention.  He is still on Flomax.  No fever, chills, or gross hematuria.  No other voiding problem.  I have reviewed his history and physical, and he is doing fine surgically, so I have told them that most likely we will go ahead and do a cystoscopy under local anesthesia tomorrow.  PAST MEDICAL HISTORY:  He has a history of having BPH and now he is diagnosed with adenocarcinoma of the cecum, has undergone a right hemicolectomy and is doing well from that.  His surgical procedures include right inguinal hernia repair and prostate biopsy.  No history of diabetes or hypertension.  FAMILY HISTORY:  No history of prostate cancer.  Mother and father had Alzheimer disease.  One brother has brain aneurysm.  ALLERGIES:  No known drug allergies.  SOCIAL HISTORY:  He quit smoking about 22 years ago.  He used to smoke cigarettes.  He smoked  about 50 pack per year, but not any more.  No use of alcohol or any illicit drug abuse.  REVIEW OF SYSTEMS:  Otherwise unremarkable.  PHYSICAL EXAMINATION:  VITAL SIGNS:  Blood pressure 138/69, temperature is 97.7, and O2 saturation 95% on July 02, 2011. ABDOMEN:  Soft, nondistended.  There is a fresh surgical wound in the right lower quadrant, healing nicely. EXTERNAL GENITALIA:  Circumcised, meatus adequate.  Testicles are normal. RECTAL:  Deferred. EXTREMITIES:  Normal.  IMPRESSION:  Benign prostatic hypertrophy and bladder stone.  PLAN:  Cystoscopy under local anesthesia tomorrow.  I appreciate Dr. Lovell Sheehan for letting me see this patient.     Ky Barban, M.D.     MIJ/MEDQ  D:  07/08/2011  T:  07/09/2011  Job:  [id number]  cc:   Dalia Heading, M.D. Fax: [phone]  Lionel December, M.D. Fax: [phone]  Catalina Pizza, M.D. Fax: [phone]

## 2011-07-10 MED ORDER — CIPROFLOXACIN IN D5W 200 MG/100ML IV SOLN
200.0000 mg | INTRAVENOUS | Status: DC
  Filled 2011-07-10: qty 100

## 2011-07-10 NOTE — Progress Notes (Signed)
Present with patient for emotional/spiritual support.  Listened and prayed with patient.

## 2011-07-10 NOTE — Progress Notes (Signed)
1 Day Post-Op  Subjective: No complaints today.  Objective: Vital signs in last 24 hours: Temp:  [97.9 F (36.6 C)-98.6 F (37 C)] 97.9 F (36.6 C) (02/07 9147) Pulse Rate:  [64-76] 64  (02/07 0633) Resp:  [16-20] 20  (02/07 8295) BP: (119-150)/(57-74) 132/68 mmHg (02/07 0633) SpO2:  [92 %-97 %] 97 % (02/07 0633) Last BM Date: 07/09/11  Intake/Output from previous day: 02/06 0701 - 02/07 0700 In: 1188.5 [P.O.:720; I.V.:468.5] Out: 1750 [Urine:1750] Intake/Output this shift:    GI: Soft, nontender, nondistended. Incision healing well.  Lab Results:   Basename 07/08/11 0508  WBC 7.5  HGB 8.0*  HCT 27.1*  PLT 280   BMET No results found for this basename: NA:2,K:2,CL:2,CO2:2,GLUCOSE:2,BUN:2,CREATININE:2,CALCIUM:2 in the last 72 hours PT/INR No results found for this basename: LABPROT:2,INR:2 in the last 72 hours  Studies/Results: No results found.  Anti-infectives: Anti-infectives     Start     Dose/Rate Route Frequency Ordered Stop   07/04/11 1300   ertapenem (INVANZ) 1 g in sodium chloride 0.9 % 50 mL IVPB  Status:  Discontinued        1 g 100 mL/hr over 30 Minutes Intravenous 60 min pre-op 07/04/11 1140 07/04/11 1652   07/04/11 1100   ertapenem (INVANZ) 1 g in sodium chloride 0.9 % 50 mL IVPB  Status:  Discontinued        1 g 100 mL/hr over 30 Minutes Intravenous 60 min pre-op 07/03/11 1859 07/04/11 1140   07/02/11 1630   penicillin v potassium (VEETID) tablet 500 mg  Status:  Discontinued     Comments: Please give first dose at 10 PM; note he has been taking this medicine for tooth abscess.      500 mg Oral 3 times per day 07/02/11 1622 07/09/11 1400   07/02/11 0200   ampicillin-sulbactam (UNASYN) 1.5 g in sodium chloride 0.9 % 50 mL IVPB  Status:  Discontinued        1.5 g 100 mL/hr over 30 Minutes Intravenous Every 6 hours 07/02/11 0158 07/02/11 0946          Assessment/Plan: s/p Procedure(s): CYSTOSCOPY FLEXIBLE Impression: Patient has recovered  from his right hemicolectomy. Do to his BPH, Dr. Jerre Simon will be performing a TURP tomorrow. Anticipate discharge soon after his TURP.  LOS: 9 days    Hallelujah Wysong A 07/10/2011

## 2011-07-10 NOTE — Op Note (Signed)
NAMEOBED, SAMEK               ACCOUNT NO.:  [id number]  MEDICAL RECORD NO.:  [id number]  LOCATION:  A316                          FACILITY:  [facility]  PHYSICIAN:  Ky Barban, M.D.DATE OF BIRTH:  10/17/1945  DATE OF PROCEDURE:  07/09/2011 DATE OF DISCHARGE:                              OPERATIVE REPORT   PREOPERATIVE DIAGNOSIS:  Urinary retention.  POSTOPERATIVE DIAGNOSIS:  Benign prostatic hypertrophy, bladder calculi.  PROCEDURE:  Cystoscopy.  SURGEON:  Ky Barban, MD  ANESTHESIA:  Xylocaine jelly.  PROCEDURE IN DETAIL:  The patient in supine position after usual prep and drape, Xylocaine jelly was instilled into the urethra.  After waiting adequate time, flexible cystoscope was introduced into the bladder under direct vision.  Anterior urethra looks normal.  Prostatic urethra is completely obstructed with trilobar hypertrophy.  Bladder is 2+ trabeculated.  No tumor, stone, foreign body, or inflammation.  There are multiple small calculi in the bladder and no evidence of any tumor. Cystoscope was removed.  The patient left the operating room in satisfactory condition.     Ky Barban, M.D.     MIJ/MEDQ  D:  07/09/2011  T:  07/10/2011  Job:  [id number]

## 2011-07-10 NOTE — Progress Notes (Signed)
UR Chart Review Completed  

## 2011-07-11 ENCOUNTER — Encounter (HOSPITAL_COMMUNITY): Payer: Self-pay | Admitting: Anesthesiology

## 2011-07-11 ENCOUNTER — Inpatient Hospital Stay (HOSPITAL_COMMUNITY): Payer: Medicare Other | Admitting: Anesthesiology

## 2011-07-11 ENCOUNTER — Encounter (HOSPITAL_COMMUNITY)
Admission: EM | Disposition: A | Discharge status: Home / Self Care | Payer: Self-pay | Source: Home / Self Care | Attending: General Surgery

## 2011-07-11 ENCOUNTER — Other Ambulatory Visit: Payer: Self-pay | Admitting: Urology

## 2011-07-11 HISTORY — PX: TRANSURETHRAL RESECTION OF PROSTATE: SHX73

## 2011-07-11 HISTORY — PX: CYSTOSCOPY WITH LITHOLAPAXY: SHX1425

## 2011-07-11 LAB — PREPARE RBC (CROSSMATCH)

## 2011-07-11 SURGERY — TURP (TRANSURETHRAL RESECTION OF PROSTATE)
Anesthesia: General | Site: Bladder | Wound class: Clean Contaminated

## 2011-07-11 MED ORDER — MIDAZOLAM HCL 2 MG/2ML IJ SOLN
1.0000 mg | INTRAMUSCULAR | Status: DC | PRN
  Administered 2011-07-11: 2 mg via INTRAVENOUS

## 2011-07-11 MED ORDER — SODIUM CHLORIDE 0.9 % IR SOLN
Status: DC | PRN
  Administered 2011-07-11: 3000 mL

## 2011-07-11 MED ORDER — ONDANSETRON HCL 4 MG/2ML IJ SOLN: 4.0000 mg | Freq: Once | INTRAMUSCULAR | Status: DC | PRN

## 2011-07-11 MED ORDER — HYDROMORPHONE HCL PF 2 MG/ML IJ SOLN
INTRAMUSCULAR | Status: AC
  Administered 2011-07-11: 0.25 mg
  Filled 2011-07-11: qty 1

## 2011-07-11 MED ORDER — ONDANSETRON HCL 4 MG/2ML IJ SOLN
INTRAMUSCULAR | Status: AC
  Filled 2011-07-11: qty 2

## 2011-07-11 MED ORDER — CIPROFLOXACIN IN D5W 400 MG/200ML IV SOLN
INTRAVENOUS | Status: DC | PRN
  Administered 2011-07-11: 200 mg via INTRAVENOUS

## 2011-07-11 MED ORDER — CIPROFLOXACIN IN D5W 200 MG/100ML IV SOLN
INTRAVENOUS | Status: AC
Start: 2011-07-11 — End: 2011-07-11
  Filled 2011-07-11: qty 100

## 2011-07-11 MED ORDER — HYDROMORPHONE HCL PF 1 MG/ML IJ SOLN
0.2500 mg | INTRAMUSCULAR | Status: DC | PRN
  Administered 2011-07-11 (×3): 0.5 mg via INTRAVENOUS

## 2011-07-11 MED ORDER — FENTANYL CITRATE 0.05 MG/ML IJ SOLN
INTRAMUSCULAR | Status: AC
  Administered 2011-07-11: 50 ug via INTRAVENOUS
  Filled 2011-07-11: qty 2

## 2011-07-11 MED ORDER — MIDAZOLAM HCL 2 MG/2ML IJ SOLN
INTRAMUSCULAR | Status: AC
  Filled 2011-07-11: qty 2

## 2011-07-11 MED ORDER — GLYCOPYRROLATE 0.2 MG/ML IJ SOLN
0.2000 mg | Freq: Once | INTRAMUSCULAR | Status: AC
  Administered 2011-07-11: 0.2 mg via INTRAVENOUS

## 2011-07-11 MED ORDER — PROPOFOL 10 MG/ML IV EMUL
INTRAVENOUS | Status: AC
  Filled 2011-07-11: qty 20

## 2011-07-11 MED ORDER — ACETAMINOPHEN 325 MG PO TABS: 325.0000 mg | ORAL_TABLET | ORAL | Status: DC | PRN

## 2011-07-11 MED ORDER — PHENOL 1.4 % MT LIQD
1.0000 | OROMUCOSAL | Status: DC | PRN
  Administered 2011-07-11: 1 via OROMUCOSAL
  Filled 2011-07-11: qty 177

## 2011-07-11 MED ORDER — HYDROCODONE-ACETAMINOPHEN 5-325 MG PO TABS: 1.0000 | ORAL_TABLET | ORAL | Status: DC | PRN

## 2011-07-11 MED ORDER — DEXTROSE-NACL 5-0.45 % IV SOLN
INTRAVENOUS | Status: DC
  Administered 2011-07-11 (×2): via INTRAVENOUS

## 2011-07-11 MED ORDER — ONDANSETRON HCL 4 MG/2ML IJ SOLN
4.0000 mg | Freq: Once | INTRAMUSCULAR | Status: AC
  Administered 2011-07-11: 4 mg via INTRAVENOUS

## 2011-07-11 MED ORDER — FENTANYL CITRATE 0.05 MG/ML IJ SOLN
25.0000 ug | INTRAMUSCULAR | Status: DC | PRN
  Administered 2011-07-11 (×4): 50 ug via INTRAVENOUS

## 2011-07-11 MED ORDER — LIDOCAINE HCL (CARDIAC) 10 MG/ML IV SOLN
INTRAVENOUS | Status: DC | PRN
  Administered 2011-07-11: 50 mg via INTRAVENOUS

## 2011-07-11 MED ORDER — LIDOCAINE HCL (PF) 1 % IJ SOLN
INTRAMUSCULAR | Status: AC
  Filled 2011-07-11: qty 5

## 2011-07-11 MED ORDER — SUCCINYLCHOLINE CHLORIDE 20 MG/ML IJ SOLN
INTRAMUSCULAR | Status: AC
  Filled 2011-07-11: qty 1

## 2011-07-11 MED ORDER — GLYCOPYRROLATE 0.2 MG/ML IJ SOLN
INTRAMUSCULAR | Status: AC
  Filled 2011-07-11: qty 1

## 2011-07-11 MED ORDER — GLYCINE 1.5 % IR SOLN
Status: DC | PRN
  Administered 2011-07-11: 6000 mL

## 2011-07-11 MED ORDER — SODIUM CHLORIDE 0.9 % IV SOLN
INTRAVENOUS | Status: DC | PRN
  Administered 2011-07-11: 11:00:00 via INTRAVENOUS

## 2011-07-11 MED ORDER — STERILE WATER FOR IRRIGATION IR SOLN
Status: DC | PRN
  Administered 2011-07-11: 1000 mL

## 2011-07-11 MED ORDER — PROPOFOL 10 MG/ML IV EMUL
INTRAVENOUS | Status: DC | PRN
  Administered 2011-07-11: 180 mg via INTRAVENOUS

## 2011-07-11 MED ORDER — SUCCINYLCHOLINE CHLORIDE 20 MG/ML IJ SOLN
INTRAMUSCULAR | Status: DC | PRN
  Administered 2011-07-11: 140 mg via INTRAVENOUS

## 2011-07-11 MED ORDER — FENTANYL CITRATE 0.05 MG/ML IJ SOLN
INTRAMUSCULAR | Status: AC
  Filled 2011-07-11: qty 2

## 2011-07-11 MED ORDER — LACTATED RINGERS IV SOLN
INTRAVENOUS | Status: DC | PRN
  Administered 2011-07-11: 12:00:00 via INTRAVENOUS

## 2011-07-11 MED ORDER — GLYCINE 1.5 % IR SOLN
Status: DC | PRN
  Administered 2011-07-11: 24000 mL

## 2011-07-11 MED ORDER — SODIUM CHLORIDE 0.9 % IV SOLN
INTRAVENOUS | Status: DC
  Administered 2011-07-11: 11:00:00 via INTRAVENOUS

## 2011-07-11 MED ORDER — FENTANYL CITRATE 0.05 MG/ML IJ SOLN
INTRAMUSCULAR | Status: DC | PRN
  Administered 2011-07-11 (×2): 100 ug via INTRAVENOUS

## 2011-07-11 SURGICAL SUPPLY — 40 items
BAG DECANTER FOR FLEXI CONT (MISCELLANEOUS) IMPLANT
BAG DRAIN URO TABLE W/ADPT NS (DRAPE) ×2 IMPLANT
BAG HAMPER (MISCELLANEOUS) ×2 IMPLANT
BAG URINE DRAIN TURP 4L (OSTOMY) ×2 IMPLANT
BAG URINE DRAINAGE (UROLOGICAL SUPPLIES) IMPLANT
CABLE HI FREQUENCY MONOPOLAR (ELECTROSURGICAL) ×2 IMPLANT
CATH FOLEY 3WAY 30CC 22F (CATHETERS) ×2 IMPLANT
CLOTH BEACON ORANGE TIMEOUT ST (SAFETY) ×2 IMPLANT
CONNECTOR 5 IN 1 STRAIGHT STRL (MISCELLANEOUS) ×2 IMPLANT
COVER LIGHT HANDLE STERIS (MISCELLANEOUS) ×2 IMPLANT
DILATOR BALLN URETERAL SET (BALLOONS) IMPLANT
DRAPE STERI URO 23X35 APER SZ5 (DRAPE) ×2 IMPLANT
ELECT CUT LOOP C-MAX 27FR .012 (CUTTING LOOP)
ELECT REM PT RETURN 9FT ADLT (ELECTROSURGICAL) ×2
ELECTRODE CUT LP CMX 27FR .012 (CUTTING LOOP) IMPLANT
ELECTRODE REM PT RTRN 9FT ADLT (ELECTROSURGICAL) ×1 IMPLANT
EVACUATOR ELLICK (MISCELLANEOUS) ×2 IMPLANT
FLOOR PAD 36X40 (MISCELLANEOUS)
FORMALIN 10 PREFIL 480ML (MISCELLANEOUS) IMPLANT
GLOVE BIO SURGEON STRL SZ7 (GLOVE) ×2 IMPLANT
GLOVE ECLIPSE 7.0 STRL STRAW (GLOVE) ×2 IMPLANT
GLOVE EXAM NITRILE MD LF STRL (GLOVE) ×2 IMPLANT
GLOVE INDICATOR 7.0 STRL GRN (GLOVE) ×2 IMPLANT
GLYCINE 1.5% IRRIG UROMATIC (IV SOLUTION) ×18 IMPLANT
GOWN STRL REIN XL XLG (GOWN DISPOSABLE) ×4 IMPLANT
IV NS IRRIG 3000ML ARTHROMATIC (IV SOLUTION) IMPLANT
KIT ROOM TURNOVER AP CYSTO (KITS) ×2 IMPLANT
LASER FIBER DISP (UROLOGICAL SUPPLIES) IMPLANT
LASER FIBER DISP 1000U (UROLOGICAL SUPPLIES) IMPLANT
MANIFOLD NEPTUNE II (INSTRUMENTS) ×2 IMPLANT
PACK CYSTO (CUSTOM PROCEDURE TRAY) ×2 IMPLANT
PAD ARMBOARD 7.5X6 YLW CONV (MISCELLANEOUS) ×2 IMPLANT
PAD FLOOR 36X40 (MISCELLANEOUS) IMPLANT
SET IRRIGATING DISP (SET/KITS/TRAYS/PACK) ×2 IMPLANT
SYR 30ML LL (SYRINGE) ×2 IMPLANT
SYRINGE IRR TOOMEY STRL 70CC (SYRINGE) ×2 IMPLANT
TOWEL OR 17X26 4PK STRL BLUE (TOWEL DISPOSABLE) ×2 IMPLANT
WATER STERILE IRR 1000ML POUR (IV SOLUTION) ×2 IMPLANT
XPEEDA 550 SIDEFIRING FIBER (MISCELLANEOUS) IMPLANT
YANKAUER SUCT BULB TIP 10FT TU (MISCELLANEOUS) ×2 IMPLANT

## 2011-07-11 NOTE — Discharge Summary (Signed)
Physician Discharge Summary  Patient ID: William Burns MRN: [id number] DOB/AGE: 07/07/1945 66 y.o.  Admit date: 07/01/2011 Discharge date: 07/11/2011  Admission Diagnoses: Anemia  Discharge Diagnoses: Colon carcinoma, T3, N0, M0; benign prostatic hypertrophy Principal Problem:  *Microcytic hypochromic anemia Active Problems:  Hx of colonic polyp  Hyperglycemia  BPH (benign prostatic hyperplasia)  Tooth infection  Colonic mass  Erosive gastritis  Calculus, bladder   Discharged Condition: good  Hospital Course: Patient is a 66 year old white male who presented emergency room and was found to be severely anemic. He required multiple blood transfusions. Dr. Karilyn Cota of GI was consulted and the patient underwent a colonoscopy on 07/03/2011. He was found to have a large right colon mass. Surgery was subsequently consulted and he underwent a right hemicolectomy on 07/04/2011. He tolerated the procedure well. His postoperative course was remarkable for difficulties with voiding. He was also noted to have a possible stone in the bladder. After his Foley was removed, he had difficulty voiding. He underwent a cystoscopy by Dr. Jesse Fall on 07/09/2011. He was found to have significant BPH and subsequently underwent a TURP on 07/11/2011. He'll be discharged on postoperative day one. Final pathology of the colon mass reveals a T3, N0, M0 adenocarcinoma of the right colon. The patient has been made aware of his diagnosis.  Consults: GI, general surgery and urology  Treatments: surgery: Colonoscopy on 07/03/2011, right hemicolectomy on 07/04/2011, cystoscopy on 07/09/2011, TURP on 07/11/2011  Discharge Exam: Blood pressure 139/71, pulse 69, temperature 97.8 F (36.6 C), temperature source Oral, resp. rate 20, height 6\' 3"  (1.905 m), weight 112.356 kg (247 lb 11.2 oz), SpO2 95.00%. General appearance: alert, cooperative and no distress Resp: clear to auscultation bilaterally Cardio: regular rate and rhythm,  S1, S2 normal, no murmur, click, rub or gallop GI: soft, non-tender; bowel sounds normal; no masses,  no organomegaly. Incision healing well.  Disposition: Home   Medication List  As of 07/11/2011 10:27 AM   ASK your doctor about these medications         aspirin EC 81 MG tablet   Take 81 mg by mouth daily.      FISH OIL TRIPLE STRENGTH PO   Take 1 capsule by mouth daily.      mulitivitamin with minerals Tabs   Take 1 tablet by mouth daily. MEGA MENS 50+ MULTIVITAMIN      penicillin v potassium 500 MG tablet   Commonly known as: VEETID   Take 500 mg by mouth 4 (four) times daily.      Tamsulosin HCl 0.4 MG Caps   Commonly known as: FLOMAX   Take 0.4 mg by mouth daily.           Follow-up Information    Follow up with Dalia Heading, MD. Schedule an appointment as soon as possible for a visit on 07/15/2011.   Contact information:   [address] [place] [address] [phone]          Signed: Dalia Heading 07/11/2011, 10:27 AM

## 2011-07-11 NOTE — Anesthesia Postprocedure Evaluation (Signed)
  Anesthesia Post-op Note  Patient: William Burns  Procedure(s) Performed:  TRANSURETHRAL RESECTION OF THE PROSTATE (TURP); CYSTOSCOPY  Patient Location: PACU  Anesthesia Type: General  Level of Consciousness: awake, alert , oriented and patient cooperative  Airway and Oxygen Therapy: Patient Spontanous Breathing and Patient connected to face mask oxygen  Post-op Pain: none  Post-op Assessment: Post-op Vital signs reviewed, Patient's Cardiovascular Status Stable, Respiratory Function Stable, Patent Airway and No signs of Nausea or vomiting  Post-op Vital Signs: Reviewed and stable  Complications: No apparent anesthesia complications

## 2011-07-11 NOTE — Addendum Note (Signed)
Addendum  created 07/11/11 1325 by Roselie Awkward, MD   Modules edited:Orders, PRL Based Order Sets

## 2011-07-11 NOTE — Anesthesia Procedure Notes (Signed)
Procedure Name: Intubation Date/Time: 07/11/2011 11:06 AM Performed by: Carolyne Littles, AMY Pre-anesthesia Checklist: Patient identified, Patient being monitored, Emergency Drugs available, Timeout performed and Suction available Patient Re-evaluated:Patient Re-evaluated prior to inductionOxygen Delivery Method: Circle System Utilized Preoxygenation: Pre-oxygenation with 100% oxygen Intubation Type: IV induction Ventilation: Mask ventilation without difficulty Tube size: 8.0 mm Number of attempts: 1 Airway Equipment and Method: video-laryngoscopy Placement Confirmation: ETT inserted through vocal cords under direct vision,  positive ETCO2 and breath sounds checked- equal and bilateral Secured at: 23 cm Tube secured with: Tape Dental Injury: Teeth and Oropharynx as per pre-operative assessment  Difficulty Due To: Difficulty was anticipated

## 2011-07-11 NOTE — Progress Notes (Signed)
No change in H&P on reexamination. 

## 2011-07-11 NOTE — Preoperative (Signed)
Beta Blockers   Reason not to administer Beta Blockers:Not Applicable 

## 2011-07-11 NOTE — Anesthesia Preprocedure Evaluation (Signed)
Anesthesia Evaluation  Patient identified by MRN, date of birth, ID band Patient awake    Reviewed: Allergy & Precautions, H&P , NPO status , Patient's Chart, lab work & pertinent test results  History of Anesthesia Complications (+) DIFFICULT AIRWAY  Airway Mallampati: II TM Distance: >3 FB Neck ROM: Full   Comment: Glidescope with #4, easily visualized cords.  Unable to visualize with Mac3 or Mac4. Dental  (+) Partial Upper   Pulmonary neg pulmonary ROS,    Pulmonary exam normal       Cardiovascular neg cardio ROS Regular Normal    Neuro/Psych Negative Neurological ROS  Negative Psych ROS   GI/Hepatic negative GI ROS, Neg liver ROS,   Endo/Other  Negative Endocrine ROS  Renal/GU Renal disease (kidney stones)     Musculoskeletal negative musculoskeletal ROS (+)   Abdominal Normal abdominal exam  (+)   Peds  Hematology  (+) Blood dyscrasia, anemia ,   Anesthesia Other Findings   Reproductive/Obstetrics                           Anesthesia Physical Anesthesia Plan  ASA: II  Anesthesia Plan: General   Post-op Pain Management:    Induction: Intravenous  Airway Management Planned: Oral ETT and Video Laryngoscope Planned  Additional Equipment:   Intra-op Plan:   Post-operative Plan: Extubation in OR  Informed Consent: I have reviewed the patients History and Physical, chart, labs and discussed the procedure including the risks, benefits and alternatives for the proposed anesthesia with the patient or authorized representative who has indicated his/her understanding and acceptance.     Plan Discussed with: CRNA  Anesthesia Plan Comments: (PRBCs 1 unit started in pre-op for Hgb 8.0)        Anesthesia Quick Evaluation

## 2011-07-11 NOTE — Transfer of Care (Signed)
Immediate Anesthesia Transfer of Care Note  Patient: William Burns  Procedure(s) Performed:  TRANSURETHRAL RESECTION OF THE PROSTATE (TURP); CYSTOSCOPY  Patient Location: PACU  Anesthesia Type: General  Level of Consciousness: awake, alert , oriented and patient cooperative  Airway & Oxygen Therapy: Patient Spontanous Breathing and Patient connected to face mask oxygen  Post-op Assessment: Report given to PACU RN and Post -op Vital signs reviewed and stable  Post vital signs: Reviewed and stable  Complications: No apparent anesthesia complications

## 2011-07-11 NOTE — Brief Op Note (Signed)
07/01/2011 - 07/11/2011  12:09 PM  PATIENT:  William Burns  66 y.o. male  PRE-OPERATIVE DIAGNOSIS:  BPH,bladder calculi  POST-OPERATIVE DIAGNOSIS:  BPH,bladder calculi  PROCEDURE:  Procedure(s): TRANSURETHRAL RESECTION OF THE PROSTATE (TURP) CYSTOSCOPY WITH LITHOLAPAXY  SURGEON:  Surgeon(s): Ky Barban, MD  PHYSICIAN ASSISTANT:   ASSISTANTS: none   ANESTHESIA:   general  :  Total I/O In: 1350 [I.V.:1000; Blood:350] Out: 425 [Urine:425]ebl100cc  BLOOD ADMINISTERED:none  DRAINS: Urinary Catheter (Foley)   LOCAL MEDICATIONS USED:  NONE  SPECIMEN:  Source of Specimen:  prostate chips and bladder stone.  DISPOSITION OF SPECIMEN:  PATHOLOGY  COUNTS:  YES  TOURNIQUET:  * No tourniquets in log *  DICTATION: .Other Dictation: Dictation Number [id number]  PLAN OF CARE: Admit for overnight observation  PATIENT DISPOSITION:  PACU - hemodynamically stable.   Delay start of Pharmacological VTE agent (>24hrs) due to surgical blood loss or risk of bleeding: YES

## 2011-07-12 LAB — CBC
MCH: 21.7 pg — ABNORMAL LOW (ref 26.0–34.0)
MCHC: 29 g/dL — ABNORMAL LOW (ref 30.0–36.0)
MCV: 74.8 fL — ABNORMAL LOW (ref 78.0–100.0)
Platelets: 416 10*3/uL — ABNORMAL HIGH (ref 150–400)

## 2011-07-12 NOTE — Progress Notes (Signed)
[phone]

## 2011-07-12 NOTE — Op Note (Signed)
NAMECHAEL, URENDA               ACCOUNT NO.:  [id number]  MEDICAL RECORD NO.:  [id number]  LOCATION:  A316                          FACILITY:  [facility]  PHYSICIAN:  Ky Barban, M.D.DATE OF BIRTH:  [birth year]  DATE OF PROCEDURE:  07/11/2011 DATE OF DISCHARGE:                              OPERATIVE REPORT   PREOPERATIVE DIAGNOSIS:  Benign prostatic hypertrophy with bladder calculus and acute urinary retention.  POSTOPERATIVE DIAGNOSIS:  Benign prostatic hypertrophy with bladder calculus and acute urinary retention.  PROCEDURE:  Transurethral resection of the prostate and litholapaxy.  SURGEON:  Ky Barban, M.D.  ANESTHESIA:  General.  PROCEDURE IN DETAIL:  The patient under general endotracheal anesthesia, in lithotomy position, after usual prep and drape, #28 Iglesias resectoscope was introduced into the bladder.  It was inspected again. Resectoscope was pulled back in mid prostatic urethra.  A small median lobe was resected up to the level of the mid prostatic urethra.  Then, the bladder neck was circumferentially resected down to the circular fibers.  Bleeders were coagulated.  Resectoscope was pulled back at the level of the verumontanum rotated to 11 o'clock position.  Resection of the right lobe was done at 11 o'clock position down to the capsule and then the remaining lobe between 11 and 7 o'clock position was completely dissected.  Similarly, the left lobe was resected between 1 and 5 o'clock position.  The prostatic urethra already looks wide open.  There was some tissue in the lateral lobe, which was resected on both sides. The chips were evacuated.  The stone came out with the chips, so I did not have to break the stone.  Chips were evacuated.  Bleeders were coagulated.  Prostatic urethra looks wide open and the resectoscope was removed.  A #22 three-way Foley catheter was left for drainage.  CBI was started which was clear.  The patient left the  operating room in a satisfactory condition.     Ky Barban, M.D.     MIJ/MEDQ  D:  07/11/2011  T:  07/12/2011  Job:  [id number]

## 2011-07-12 NOTE — Progress Notes (Signed)
Irrigation stopped and foley put to straight drainage. To be discharged home today and to follow up with jenkins and Javaid on tues.  Patient on regular diet

## 2011-07-12 NOTE — Progress Notes (Signed)
William Burns, William Burns               ACCOUNT NO.:  [id number]  MEDICAL RECORD NO.:  [id number]  LOCATION:  A316                          FACILITY:  [facility]  PHYSICIAN:  Ky Barban, M.D.DATE OF BIRTH:  [birth year]  DATE OF PROCEDURE: DATE OF DISCHARGE:  07/12/2011                                PROGRESS NOTE   He had a TUR prostate done yesterday.  He is fully conscious, alert, oriented, and no specific complaint.  Blood pressure is 127/80, temperature is 97.8.  Abdomen is soft, flat.  Liver, spleen, kidneys are not palpable.  CBI is clear.  PLAN:  Discontinue CBI.  Regular died, out of bed, then he can be discharged home, and I will see him in office on Tuesday.  He is being discharged home with Foley catheter.  I will take it out on Tuesday.  I have told him if any problems, let me know.     Ky Barban, M.D.     MIJ/MEDQ  D:  07/12/2011  T:  07/12/2011  Job:  [id number]

## 2011-07-14 ENCOUNTER — Encounter (INDEPENDENT_AMBULATORY_CARE_PROVIDER_SITE_OTHER): Payer: Self-pay | Admitting: *Deleted

## 2011-07-14 ENCOUNTER — Encounter (HOSPITAL_COMMUNITY): Payer: Self-pay | Admitting: Urology

## 2011-07-14 LAB — TYPE AND SCREEN
ABO/RH(D): B POS
Antibody Screen: NEGATIVE
Unit division: 0
Unit division: 0

## 2011-07-15 LAB — STONE ANALYSIS

## 2011-07-19 ENCOUNTER — Emergency Department (HOSPITAL_COMMUNITY): Payer: Medicare Other

## 2011-07-19 ENCOUNTER — Encounter (HOSPITAL_COMMUNITY): Payer: Self-pay | Admitting: *Deleted

## 2011-07-19 ENCOUNTER — Inpatient Hospital Stay (HOSPITAL_COMMUNITY)
Admission: EM | Admit: 2011-07-19 | Discharge: 2011-07-22 | DRG: 690 | Disposition: A | Discharge status: Home / Self Care | Payer: Medicare Other | Attending: General Surgery | Admitting: General Surgery

## 2011-07-19 DIAGNOSIS — B952 Enterococcus as the cause of diseases classified elsewhere: Secondary | ICD-10-CM | POA: Diagnosis present

## 2011-07-19 DIAGNOSIS — R188 Other ascites: Secondary | ICD-10-CM | POA: Diagnosis not present

## 2011-07-19 DIAGNOSIS — IMO0002 Reserved for concepts with insufficient information to code with codable children: Secondary | ICD-10-CM

## 2011-07-19 DIAGNOSIS — R197 Diarrhea, unspecified: Secondary | ICD-10-CM | POA: Diagnosis not present

## 2011-07-19 DIAGNOSIS — N390 Urinary tract infection, site not specified: Secondary | ICD-10-CM | POA: Diagnosis not present

## 2011-07-19 DIAGNOSIS — R112 Nausea with vomiting, unspecified: Secondary | ICD-10-CM | POA: Diagnosis not present

## 2011-07-19 DIAGNOSIS — Z87891 Personal history of nicotine dependence: Secondary | ICD-10-CM

## 2011-07-19 DIAGNOSIS — Z87442 Personal history of urinary calculi: Secondary | ICD-10-CM | POA: Diagnosis not present

## 2011-07-19 DIAGNOSIS — Z6827 Body mass index (BMI) 27.0-27.9, adult: Secondary | ICD-10-CM

## 2011-07-19 DIAGNOSIS — Z85038 Personal history of other malignant neoplasm of large intestine: Secondary | ICD-10-CM | POA: Diagnosis not present

## 2011-07-19 DIAGNOSIS — E860 Dehydration: Secondary | ICD-10-CM | POA: Diagnosis present

## 2011-07-19 DIAGNOSIS — K56609 Unspecified intestinal obstruction, unspecified as to partial versus complete obstruction: Secondary | ICD-10-CM | POA: Diagnosis not present

## 2011-07-19 DIAGNOSIS — Z79899 Other long term (current) drug therapy: Secondary | ICD-10-CM | POA: Diagnosis not present

## 2011-07-19 DIAGNOSIS — D72829 Elevated white blood cell count, unspecified: Secondary | ICD-10-CM | POA: Diagnosis not present

## 2011-07-19 DIAGNOSIS — N400 Enlarged prostate without lower urinary tract symptoms: Secondary | ICD-10-CM | POA: Diagnosis present

## 2011-07-19 DIAGNOSIS — L02219 Cutaneous abscess of trunk, unspecified: Secondary | ICD-10-CM | POA: Diagnosis not present

## 2011-07-19 DIAGNOSIS — N3289 Other specified disorders of bladder: Secondary | ICD-10-CM | POA: Diagnosis not present

## 2011-07-19 DIAGNOSIS — L03319 Cellulitis of trunk, unspecified: Secondary | ICD-10-CM | POA: Diagnosis not present

## 2011-07-19 LAB — URINALYSIS, ROUTINE W REFLEX MICROSCOPIC
Glucose, UA: NEGATIVE mg/dL
Ketones, ur: NEGATIVE mg/dL
Nitrite: NEGATIVE
Specific Gravity, Urine: >1.03 — ABNORMAL HIGH (ref 1.005–1.030)
pH: 5.5 (ref 5.0–8.0)

## 2011-07-19 LAB — DIFFERENTIAL
Basophils Absolute: 0.1 10*3/uL (ref 0.0–0.1)
Basophils Relative: 1 % (ref 0–1)
Eosinophils Relative: 4 % (ref 0–5)
Lymphocytes Relative: 13 % (ref 12–46)
Neutro Abs: 7.6 10*3/uL (ref 1.7–7.7)

## 2011-07-19 LAB — URINE MICROSCOPIC-ADD ON

## 2011-07-19 LAB — CBC
MCHC: 29.3 g/dL — ABNORMAL LOW (ref 30.0–36.0)
Platelets: 748 10*3/uL — ABNORMAL HIGH (ref 150–400)
RDW: 19.9 % — ABNORMAL HIGH (ref 11.5–15.5)
WBC: 9.8 10*3/uL (ref 4.0–10.5)

## 2011-07-19 LAB — COMPREHENSIVE METABOLIC PANEL
Albumin: 3.8 g/dL (ref 3.5–5.2)
Alkaline Phosphatase: 91 U/L (ref 39–117)
BUN: 19 mg/dL (ref 6–23)
Calcium: 10.1 mg/dL (ref 8.4–10.5)
GFR calc Af Amer: >90 mL/min (ref >90)
Glucose, Bld: 146 mg/dL — ABNORMAL HIGH (ref 70–99)
Potassium: 4.1 mEq/L (ref 3.5–5.1)
Sodium: 136 mEq/L (ref 135–145)
Total Protein: 8 g/dL (ref 6.0–8.3)

## 2011-07-19 MED ORDER — MORPHINE SULFATE 2 MG/ML IJ SOLN
1.0000 mg | INTRAMUSCULAR | Status: DC | PRN
  Administered 2011-07-20: 2 mg via INTRAVENOUS
  Filled 2011-07-19: qty 1

## 2011-07-19 MED ORDER — IOHEXOL 300 MG/ML  SOLN
100.0000 mL | Freq: Once | INTRAMUSCULAR | Status: AC | PRN
  Administered 2011-07-19: 100 mL via INTRAVENOUS

## 2011-07-19 MED ORDER — ONDANSETRON HCL 4 MG/2ML IJ SOLN
4.0000 mg | Freq: Four times a day (QID) | INTRAMUSCULAR | Status: DC | PRN
  Administered 2011-07-21 – 2011-07-22 (×3): 4 mg via INTRAVENOUS
  Filled 2011-07-19 (×3): qty 2

## 2011-07-19 MED ORDER — HYDROMORPHONE HCL PF 1 MG/ML IJ SOLN
1.0000 mg | Freq: Once | INTRAMUSCULAR | Status: AC
  Administered 2011-07-19: 1 mg via INTRAVENOUS
  Filled 2011-07-19: qty 1

## 2011-07-19 MED ORDER — SODIUM CHLORIDE 0.9 % IV SOLN
Freq: Once | INTRAVENOUS | Status: AC
  Administered 2011-07-19: 14:00:00 via INTRAVENOUS

## 2011-07-19 MED ORDER — LACTATED RINGERS IV SOLN
INTRAVENOUS | Status: DC
  Administered 2011-07-19: 950 mL via INTRAVENOUS
  Administered 2011-07-20: 01:00:00 via INTRAVENOUS
  Administered 2011-07-20: 950 mL via INTRAVENOUS
  Administered 2011-07-21: 09:00:00 via INTRAVENOUS

## 2011-07-19 MED ORDER — SODIUM CHLORIDE 0.9 % IJ SOLN
INTRAMUSCULAR | Status: AC
  Administered 2011-07-19: 10 mL
  Filled 2011-07-19: qty 3

## 2011-07-19 MED ORDER — ENOXAPARIN SODIUM 40 MG/0.4ML ~~LOC~~ SOLN
40.0000 mg | SUBCUTANEOUS | Status: DC
  Administered 2011-07-19 – 2011-07-21 (×3): 40 mg via SUBCUTANEOUS
  Filled 2011-07-19 (×3): qty 0.4

## 2011-07-19 MED ORDER — ONDANSETRON HCL 4 MG/2ML IJ SOLN
4.0000 mg | Freq: Once | INTRAMUSCULAR | Status: AC
  Administered 2011-07-19: 4 mg via INTRAVENOUS
  Filled 2011-07-19: qty 2

## 2011-07-19 MED ORDER — PANTOPRAZOLE SODIUM 40 MG IV SOLR
40.0000 mg | INTRAVENOUS | Status: DC
  Administered 2011-07-19 – 2011-07-21 (×3): 40 mg via INTRAVENOUS
  Filled 2011-07-19 (×3): qty 40

## 2011-07-19 MED ORDER — SODIUM CHLORIDE 0.9 % IV BOLUS (SEPSIS)
1000.0000 mL | Freq: Once | INTRAVENOUS | Status: AC
  Administered 2011-07-19: 1000 mL via INTRAVENOUS

## 2011-07-19 MED ORDER — IOHEXOL 300 MG/ML  SOLN
20.0000 mL | Freq: Once | INTRAMUSCULAR | Status: AC | PRN
  Administered 2011-07-19: 20 mL via ORAL

## 2011-07-19 MED ORDER — CIPROFLOXACIN IN D5W 400 MG/200ML IV SOLN
400.0000 mg | Freq: Once | INTRAVENOUS | Status: AC
  Administered 2011-07-19: 400 mg via INTRAVENOUS
  Filled 2011-07-19: qty 200

## 2011-07-19 NOTE — ED Notes (Signed)
C/o N/V since Tuesday, LBM yesterday and per pt was loose, attempted some oatmeal this morning and was not able to keep down due to nausea

## 2011-07-19 NOTE — ED Notes (Signed)
Pt has had nausea, vomiting and diarrhea since Tuesday. Pt had surgery on 2/1 for an abdominal mass and then on 07/11/11 for TURP. Pt had staples and foley removed on Tuesday and started vomiting that night. Pt has not been able to keep anything down.

## 2011-07-19 NOTE — ED Notes (Signed)
Pt informed of need of urine sample, pt verbalized understanding, sipping on first bottle of contrast at this time

## 2011-07-19 NOTE — ED Provider Notes (Addendum)
This chart was scribed for [employer]. Colon Branch, MD by Wallis Mart. The patient was seen in room [facility]18[facility]18 and the patient's care was started at 12:00 PM.   CSN: [id number]  Arrival date & time 07/19/11  1105   First MD Initiated Contact with Patient 07/19/11 1142      Chief Complaint  Patient presents with  . Emesis    (Consider location/radiation/quality/duration/timing/severity/associated sxs/prior treatment) HPI William Burns is a 66 y.o. male who presents to the Emergency Department complaining of sudden onset, persistence of constant, gradually worsening diarrhea onset 4 days ago.  Pt has thrown up 3 days, including today.  Pt c/o associated nausea, loss of appetite, abd pain.  Pt denies fever and sick contact in the home.  Pt had surgery on 2/1 (Dr Lovell Sheehan) for abdominal mass and then on 07/11/11 (Dr. Garald Balding) for TURP.  Pt had staples and foley removed on Tuesday, when the vomiting started.  Pt denies straining or abdominal pain with urination. There are no other associated symptoms and no other alleviating or aggravating factors.   Pt is taking penicillin for abcessed tooth.    PCP: Margo Aye Past Medical History  Diagnosis Date  . BPH (benign prostatic hyperplasia)   . Kidney stones 2011  . Tooth infection 07/03/2011  . Colonic mass 07/03/2011  . Erosive gastritis 07/03/2011  . Difficult intubation   . Renal calculus     Past Surgical History  Procedure Date  . Hernia repair   . Prostate biopsy   . Esophagogastroduodenoscopy 07/03/2011    Procedure: ESOPHAGOGASTRODUODENOSCOPY (EGD);  Surgeon: Malissa Hippo, MD;  Location: [facility];  Service: Endoscopy;  Laterality: N/A;  . Colonoscopy 07/03/2011    Procedure: COLONOSCOPY;  Surgeon: Malissa Hippo, MD;  Location: [facility];  Service: Endoscopy;  Laterality: N/A;  . Partial colectomy 07/04/2011    Procedure: PARTIAL COLECTOMY;  Surgeon: Dalia Heading, MD;  Location: [facility];  Service: General;  Laterality: N/A;  .  Cystoscopy 07/09/2011    Procedure: Derinda Late;  Surgeon: Ky Barban, MD;  Location: [facility];  Service: Urology;  Laterality: N/A;  . Transurethral resection of prostate 07/11/2011    Procedure: TRANSURETHRAL RESECTION OF THE PROSTATE (TURP);  Surgeon: Ky Barban, MD;  Location: [facility];  Service: Urology;  Laterality: N/A;  . Cystoscopy 07/11/2011    Procedure: CYSTOSCOPY;  Surgeon: Ky Barban, MD;  Location: [facility];  Service: Urology;  Laterality: N/A;    Family History  Problem Relation Age of Onset  . Alzheimer's disease Mother   . Alzheimer's disease Father   . Aneurysm Brother     brain  . Colon cancer Neg Hx     History  Substance Use Topics  . Smoking status: Former Smoker -- 2.0 packs/day for 25 years    Types: Cigarettes    Quit date: 06/02/1989  . Smokeless tobacco: Not on file  . Alcohol Use: No      Review of Systems 10 Systems reviewed and are negative for acute change except as noted in the HPI.  Allergies  Review of patient's allergies indicates no known allergies.  Home Medications   Current Outpatient Rx  Name Route Sig Dispense Refill  . HYDROCODONE-ACETAMINOPHEN 5-325 MG PO TABS Oral Take 1-2 tablets by mouth every 4 (four) hours as needed for pain. 40 tablet 0  . ADULT MULTIVITAMIN W/MINERALS CH Oral Take 1 tablet by mouth daily. MEGA MENS 50+ MULTIVITAMIN    . FISH  OIL TRIPLE STRENGTH PO Oral Take 1 capsule by mouth daily.    Marland Kitchen PENICILLIN V POTASSIUM 500 MG PO TABS Oral Take 500 mg by mouth 4 (four) times daily.    Marland Kitchen TAMSULOSIN HCL 0.4 MG PO CAPS Oral Take 0.4 mg by mouth daily.       BP 133/72  Pulse 96  Temp(Src) 98.1 F (36.7 C) (Oral)  Resp 18  Ht 6\' 3"  (1.905 m)  Wt 218 lb (98.884 kg)  BMI 27.25 kg/m2  SpO2 100%  Physical Exam  Nursing note and vitals reviewed. Constitutional: He is oriented to person, place, and time. He appears well-developed and well-nourished. No distress.       Appears uncomfortable    HENT:  Head: Normocephalic and atraumatic.  Eyes: EOM are normal. Pupils are equal, round, and reactive to light.  Neck: Normal range of motion. Neck supple. No tracheal deviation present.  Cardiovascular: Normal rate, regular rhythm and normal heart sounds.   Pulmonary/Chest: Effort normal and breath sounds normal. No respiratory distress.  Abdominal: Soft. Bowel sounds are normal. He exhibits no distension.       Epigastric discomfort, right sided abd discomfort at incision site of surgery  Musculoskeletal: Normal range of motion. He exhibits no edema.  Neurological: He is alert and oriented to person, place, and time. No sensory deficit.  Skin: Skin is warm and dry.  Psychiatric: He has a normal mood and affect. His behavior is normal.    ED Course  Procedures (including critical care time) DIAGNOSTIC STUDIES: Oxygen Saturation is 100% on room air, normal by my interpretation.    COORDINATION OF CARE:  1340 Spoke with Dr. Suzette Battiest, general surgeon.discussed patient presentation and possible sources of nausea and diarrhea. Will call him when results have returned. 1540 Spoke with Dr. Leticia Penna. He will admit the patient. Results for orders placed during the hospital encounter of 07/19/11  CBC      Component Value Range   WBC 9.8  4.0 - 10.5 (K/uL)   RBC 4.82  4.22 - 5.81 (MIL/uL)   Hemoglobin 10.3 (*) 13.0 - 17.0 (g/dL)   HCT 16.1 (*) 09.6 - 52.0 (%)   MCV 72.8 (*) 78.0 - 100.0 (fL)   MCH 21.4 (*) 26.0 - 34.0 (pg)   MCHC 29.3 (*) 30.0 - 36.0 (g/dL)   RDW 04.5 (*) 40.9 - 15.5 (%)   Platelets 748 (*) 150 - 400 (K/uL)  DIFFERENTIAL      Component Value Range   Neutrophils Relative 78 (*) 43 - 77 (%)   Neutro Abs 7.6  1.7 - 7.7 (K/uL)   Lymphocytes Relative 13  12 - 46 (%)   Lymphs Abs 1.3  0.7 - 4.0 (K/uL)   Monocytes Relative 6  3 - 12 (%)   Monocytes Absolute 0.5  0.1 - 1.0 (K/uL)   Eosinophils Relative 4  0 - 5 (%)   Eosinophils Absolute 0.3  0.0 - 0.7 (K/uL)   Basophils  Relative 1  0 - 1 (%)   Basophils Absolute 0.1  0.0 - 0.1 (K/uL)   WBC Morphology WHITE COUNT CONFIRMED ON SMEAR     RBC Morphology ELLIPTOCYTES    COMPREHENSIVE METABOLIC PANEL      Component Value Range   Sodium 136  135 - 145 (mEq/L)   Potassium 4.1  3.5 - 5.1 (mEq/L)   Chloride 98  96 - 112 (mEq/L)   CO2 27  19 - 32 (mEq/L)   Glucose, Bld 146 (*) 70 -  99 (mg/dL)   BUN 19  6 - 23 (mg/dL)   Creatinine, Ser 1.61  0.50 - 1.35 (mg/dL)   Calcium 09.6  8.4 - 10.5 (mg/dL)   Total Protein 8.0  6.0 - 8.3 (g/dL)   Albumin 3.8  3.5 - 5.2 (g/dL)   AST 33  0 - 37 (U/L)   ALT 32  0 - 53 (U/L)   Alkaline Phosphatase 91  39 - 117 (U/L)   Total Bilirubin 0.4  0.3 - 1.2 (mg/dL)   GFR calc non Af Amer 83 (*) >90 (mL/min)   GFR calc Af Amer >90  >90 (mL/min)  URINALYSIS, ROUTINE W REFLEX MICROSCOPIC      Component Value Range   Color, Urine YELLOW  YELLOW    APPearance CLOUDY (*) CLEAR    Specific Gravity, Urine >1.030 (*) 1.005 - 1.030    pH 5.5  5.0 - 8.0    Glucose, UA NEGATIVE  NEGATIVE (mg/dL)   Hgb urine dipstick LARGE (*) NEGATIVE    Bilirubin Urine NEGATIVE  NEGATIVE    Ketones, ur NEGATIVE  NEGATIVE (mg/dL)   Protein, ur 045 (*) NEGATIVE (mg/dL)   Urobilinogen, UA 0.2  0.0 - 1.0 (mg/dL)   Nitrite NEGATIVE  NEGATIVE    Leukocytes, UA SMALL (*) NEGATIVE   URINE MICROSCOPIC-ADD ON      Component Value Range   WBC, UA 21-50  <3 (WBC/hpf)   RBC / HPF TOO NUMEROUS TO COUNT  <3 (RBC/hpf)   Bacteria, UA FEW (*) RARE       Ct Abdomen Pelvis W Contrast  07/19/2011  *RADIOLOGY REPORT*  Clinical Data: Abdominal pain, nausea.  Status post partial colectomy.  CT ABDOMEN AND PELVIS WITH CONTRAST  Technique:  Multidetector CT imaging of the abdomen and pelvis was performed following the standard protocol during bolus administration of intravenous contrast.  Contrast: OMNIPAQUE IOHEXOL 300 MG/ML IV SOLN  Comparison: 07/03/2011  Findings: Dependent atelectasis in the visualized lung bases.   Unremarkable liver, gallbladder, spleen, adrenal glands, pancreas.  Stable cyst from the upper pole left kidney.  No hydronephrosis.  Normal renal parenchymal enhancement.  Unremarkable aorta.  Stomach is nondistended.  There are multiple dilated small bowel loops with poor progression of the oral contrast material distally.  The transition point is in the right lower quadrant. Changes of right hemicolectomy are evident with a staple line in the right mid abdomen.  There is a 5.2 cm fairly well marginated gas and fluid collection medial to the staple line, near the level of the small bowel transition point, suggesting postop abscess.  There is a less well-defined 3 cm gas and fluid collection in the right lower quadrant anterior abdominal wall, at the deep margin of the incision.  Remaining colon is decompressed, unremarkable.  There is a small amount free pelvic fluid without evident loculation or peripheral enhancement. Urinary bladder is incompletely distended.  There is moderate prostatic enlargement with TURP defect.  Patchy iliac arterial calcifications.  No free air.  Laparoscopic ventral hernia mesh retention sutures are again noted.  Mild degenerative disc disease L5-S1.  IMPRESSION:  1.  Probable 5.2 cm abscess in the right lower quadrant medial to the proximal ascending colon. 2.  Mid small bowel obstruction with transition point near the level of the right lower quadrant abscess. 3.  Smaller 3 cm gas and fluid collection possibly small abscess in the right lower quadrant anterior abdominal wall.  Original Report Authenticated By: Osa Craver, M.D.  MDM  Patient with recent colon resection and TURP here with a weeks history of nausea, vomiting and diarrhea. Labs are unremarkable except for the urine which shows 21-50 white cells and too numerous to count RBCs. CT of the abdomen and pelvis show 2 different abscess and most likely reactive small bowel obstruction pattern. Given IV fluids,  analgesics, antiemetics with some improvement. Reviewed results with the patient and his wife. They are in agreement with admission under Dr. Cherylann Ratel. Initiated IV antibiotics.Pt stable in ED with no significant deterioration in condition.The patient appears reasonably stabilized for admission considering the current resources, flow, and capabilities available in the ED at this time, and I doubt any other [facility] requiring further screening and/or treatment in the ED prior to admission.  I personally performed the services described in this documentation, which was scribed in my presence. The recorded information has been reviewed and considered.  MDM Reviewed: previous chart, nursing note and vitals Reviewed previous: labs, CT scan and x-ray Interpretation: labs and CT scan Total time providing critical care: 40. Consults: general surgery      Nicoletta Dress. Colon Branch, MD 07/19/11 1549  Nicoletta Dress. Colon Branch, MD 07/19/11 1549

## 2011-07-20 LAB — COMPREHENSIVE METABOLIC PANEL
ALT: 20 U/L (ref 0–53)
AST: 23 U/L (ref 0–37)
Albumin: 3 g/dL — ABNORMAL LOW (ref 3.5–5.2)
Alkaline Phosphatase: 74 U/L (ref 39–117)
Chloride: 105 mEq/L (ref 96–112)
Potassium: 4.3 mEq/L (ref 3.5–5.1)
Total Bilirubin: 0.3 mg/dL (ref 0.3–1.2)

## 2011-07-20 LAB — DIFFERENTIAL
Basophils Absolute: 0.1 10*3/uL (ref 0.0–0.1)
Lymphocytes Relative: 19 % (ref 12–46)
Neutro Abs: 5.2 10*3/uL (ref 1.7–7.7)

## 2011-07-20 LAB — CBC
HCT: 30.8 % — ABNORMAL LOW (ref 39.0–52.0)
Platelets: 601 10*3/uL — ABNORMAL HIGH (ref 150–400)
RBC: 4.19 MIL/uL — ABNORMAL LOW (ref 4.22–5.81)
RDW: 19.6 % — ABNORMAL HIGH (ref 11.5–15.5)
WBC: 7.9 10*3/uL (ref 4.0–10.5)

## 2011-07-20 MED ORDER — PIPERACILLIN-TAZOBACTAM 3.375 G IVPB 30 MIN
3.3750 g | Freq: Three times a day (TID) | INTRAVENOUS | Status: DC
  Administered 2011-07-20 – 2011-07-22 (×5): 3.375 g via INTRAVENOUS
  Filled 2011-07-20 (×9): qty 50

## 2011-07-20 MED ORDER — HYDROCODONE-ACETAMINOPHEN 5-325 MG PO TABS
1.0000 | ORAL_TABLET | ORAL | Status: DC | PRN
  Administered 2011-07-21: 1 via ORAL
  Administered 2011-07-22: 2 via ORAL
  Filled 2011-07-20: qty 2
  Filled 2011-07-20: qty 1

## 2011-07-20 MED ORDER — PIPERACILLIN-TAZOBACTAM 3.375 G IVPB
INTRAVENOUS | Status: AC
  Filled 2011-07-20: qty 100

## 2011-07-20 MED ORDER — KETOROLAC TROMETHAMINE 30 MG/ML IJ SOLN
15.0000 mg | Freq: Three times a day (TID) | INTRAMUSCULAR | Status: AC
  Administered 2011-07-20 – 2011-07-22 (×5): 15 mg via INTRAVENOUS
  Filled 2011-07-20 (×8): qty 1

## 2011-07-20 MED ORDER — SODIUM CHLORIDE 0.9 % IJ SOLN
INTRAMUSCULAR | Status: AC
  Administered 2011-07-20: 10 mL
  Filled 2011-07-20: qty 3

## 2011-07-20 NOTE — H&P (Signed)
William Burns is an 66 y.o. male.   Chief Complaint: Persistent nausea, vomiting, diarrhea HPI: Patient had recent hemicolectomy and TURP and was recently discharged home. Since he has been home he has had persistent episodes of nausea and nonbloody emesis. Initially he was able to keep fluids down fairly well but this has persisted and now even fluids are difficult to maintain. He denies any fevers or chills.  He has also had multiple episodes of diarrhea. He denies any blood or hematochezia. Denies any hematuria. He has not been on any antidiarrheal medications. He did attempt to add yogurt to his diet but has not found much difference in his symptoms.  Past Medical History  Diagnosis Date  . BPH (benign prostatic hyperplasia)   . Kidney stones 2011  . Tooth infection 07/03/2011  . Colonic mass 07/03/2011  . Erosive gastritis 07/03/2011  . Difficult intubation   . Renal calculus     Past Surgical History  Procedure Date  . Hernia repair   . Prostate biopsy   . Esophagogastroduodenoscopy 07/03/2011    Procedure: ESOPHAGOGASTRODUODENOSCOPY (EGD);  Surgeon: Malissa Hippo, MD;  Location: [facility];  Service: Endoscopy;  Laterality: N/A;  . Colonoscopy 07/03/2011    Procedure: COLONOSCOPY;  Surgeon: Malissa Hippo, MD;  Location: [facility];  Service: Endoscopy;  Laterality: N/A;  . Partial colectomy 07/04/2011    Procedure: PARTIAL COLECTOMY;  Surgeon: Dalia Heading, MD;  Location: [facility];  Service: General;  Laterality: N/A;  . Cystoscopy 07/09/2011    Procedure: Derinda Late;  Surgeon: Ky Barban, MD;  Location: [facility];  Service: Urology;  Laterality: N/A;  . Transurethral resection of prostate 07/11/2011    Procedure: TRANSURETHRAL RESECTION OF THE PROSTATE (TURP);  Surgeon: Ky Barban, MD;  Location: [facility];  Service: Urology;  Laterality: N/A;  . Cystoscopy 07/11/2011    Procedure: CYSTOSCOPY;  Surgeon: Ky Barban, MD;  Location: [facility];  Service: Urology;   Laterality: N/A;  . Colon surgery     Family History  Problem Relation Age of Onset  . Alzheimer's disease Mother   . Alzheimer's disease Father   . Aneurysm Brother     brain  . Colon cancer Neg Hx    Social History:  reports that he quit smoking about 22 years ago. His smoking use included Cigarettes. He has a 50 pack-year smoking history. He does not have any smokeless tobacco history on file. He reports that he does not drink alcohol or use illicit drugs.  Allergies: No Known Allergies  Medications Prior to Admission  Medication Dose Route Frequency Provider Last Rate Last Dose  . 0.9 %  sodium chloride infusion   Intravenous Once [employer]. Colon Branch, MD 75 mL/hr at 07/19/11 1335    . ciprofloxacin (CIPRO) IVPB 400 mg  400 mg Intravenous Once [employer]. Colon Branch, MD 200 mL/hr at 07/19/11 1541 400 mg at 07/19/11 1541  . enoxaparin (LOVENOX) injection 40 mg  40 mg Subcutaneous Q24H Fabio Bering, MD   40 mg at 07/19/11 1841  . HYDROmorphone (DILAUDID) injection 1 mg  1 mg Intravenous Once [employer]. Colon Branch, MD   1 mg at 07/19/11 1233  . iohexol (OMNIPAQUE) 300 MG/ML solution 100 mL  100 mL Intravenous Once PRN Medication Radiologist, MD   100 mL at 07/19/11 1424  . iohexol (OMNIPAQUE) 300 MG/ML solution 20 mL  20 mL Oral Once PRN Medication Radiologist, MD   20 mL at 07/19/11 1330  .  lactated ringers infusion   Intravenous Continuous Fabio Bering, MD 115 mL/hr at 07/20/11 0947 950 mL at 07/20/11 0947  . morphine 2 MG/ML injection 1-2 mg  1-2 mg Intravenous Q4H PRN Fabio Bering, MD   2 mg at 07/20/11 0124  . ondansetron (ZOFRAN) injection 4 mg  4 mg Intravenous Once [employer]. Colon Branch, MD   4 mg at 07/19/11 1232  . ondansetron (ZOFRAN) injection 4 mg  4 mg Intravenous Q6H PRN Fabio Bering, MD      . pantoprazole (PROTONIX) injection 40 mg  40 mg Intravenous Q24H Fabio Bering, MD   40 mg at 07/19/11 1841  . sodium chloride 0.9 % bolus 1,000 mL  1,000 mL Intravenous Once [employer].  Colon Branch, MD   1,000 mL at 07/19/11 1232  . sodium chloride 0.9 % injection        10 mL at 07/19/11 1841   Medications Prior to Admission  Medication Sig Dispense Refill  . HYDROcodone-acetaminophen (NORCO) 5-325 MG per tablet Take 1-2 tablets by mouth every 4 (four) hours as needed for pain.  40 tablet  0  . Multiple Vitamin (MULITIVITAMIN WITH MINERALS) TABS Take 1 tablet by mouth daily. MEGA MENS 50+ MULTIVITAMIN      . Omega-3 Fatty Acids (FISH OIL TRIPLE STRENGTH PO) Take 1 capsule by mouth daily.      . penicillin v potassium (VEETID) 500 MG tablet Take 500 mg by mouth 2 (two) times daily.         Results for orders placed during the hospital encounter of 07/19/11 (from the past 48 hour(s))  CBC     Status: Abnormal   Collection Time   07/19/11 12:05 PM      Component Value Range Comment   WBC 9.8  4.0 - 10.5 (K/uL) WHITE COUNT CONFIRMED ON SMEAR   RBC 4.82  4.22 - 5.81 (MIL/uL)    Hemoglobin 10.3 (*) 13.0 - 17.0 (g/dL)    HCT 16.1 (*) 09.6 - 52.0 (%)    MCV 72.8 (*) 78.0 - 100.0 (fL)    MCH 21.4 (*) 26.0 - 34.0 (pg)    MCHC 29.3 (*) 30.0 - 36.0 (g/dL)    RDW 04.5 (*) 40.9 - 15.5 (%)    Platelets 748 (*) 150 - 400 (K/uL) LARGE PLATELETS PRESENT  DIFFERENTIAL     Status: Abnormal   Collection Time   07/19/11 12:05 PM      Component Value Range Comment   Neutrophils Relative 78 (*) 43 - 77 (%)    Neutro Abs 7.6  1.7 - 7.7 (K/uL)    Lymphocytes Relative 13  12 - 46 (%)    Lymphs Abs 1.3  0.7 - 4.0 (K/uL)    Monocytes Relative 6  3 - 12 (%)    Monocytes Absolute 0.5  0.1 - 1.0 (K/uL)    Eosinophils Relative 4  0 - 5 (%)    Eosinophils Absolute 0.3  0.0 - 0.7 (K/uL)    Basophils Relative 1  0 - 1 (%)    Basophils Absolute 0.1  0.0 - 0.1 (K/uL)    WBC Morphology WHITE COUNT CONFIRMED ON SMEAR   TOXIC GRANULATION   RBC Morphology ELLIPTOCYTES     COMPREHENSIVE METABOLIC PANEL     Status: Abnormal   Collection Time   07/19/11 12:05 PM      Component Value Range Comment   Sodium  136  135 - 145 (mEq/L)    Potassium 4.1  3.5 - 5.1 (mEq/L)    Chloride 98  96 - 112 (mEq/L)    CO2 27  19 - 32 (mEq/L)    Glucose, Bld 146 (*) 70 - 99 (mg/dL)    BUN 19  6 - 23 (mg/dL)    Creatinine, Ser 0.45  0.50 - 1.35 (mg/dL)    Calcium 40.9  8.4 - 10.5 (mg/dL)    Total Protein 8.0  6.0 - 8.3 (g/dL)    Albumin 3.8  3.5 - 5.2 (g/dL)    AST 33  0 - 37 (U/L)    ALT 32  0 - 53 (U/L)    Alkaline Phosphatase 91  39 - 117 (U/L)    Total Bilirubin 0.4  0.3 - 1.2 (mg/dL)    GFR calc non Af Amer 83 (*) >90 (mL/min)    GFR calc Af Amer >90  >90 (mL/min)   URINALYSIS, ROUTINE W REFLEX MICROSCOPIC     Status: Abnormal   Collection Time   07/19/11  1:18 PM      Component Value Range Comment   Color, Urine YELLOW  YELLOW     APPearance CLOUDY (*) CLEAR     Specific Gravity, Urine >1.030 (*) 1.005 - 1.030     pH 5.5  5.0 - 8.0     Glucose, UA NEGATIVE  NEGATIVE (mg/dL)    Hgb urine dipstick LARGE (*) NEGATIVE     Bilirubin Urine NEGATIVE  NEGATIVE     Ketones, ur NEGATIVE  NEGATIVE (mg/dL)    Protein, ur 811 (*) NEGATIVE (mg/dL)    Urobilinogen, UA 0.2  0.0 - 1.0 (mg/dL)    Nitrite NEGATIVE  NEGATIVE     Leukocytes, UA SMALL (*) NEGATIVE    URINE MICROSCOPIC-ADD ON     Status: Abnormal   Collection Time   07/19/11  1:18 PM      Component Value Range Comment   WBC, UA 21-50  <3 (WBC/hpf)    RBC / HPF TOO NUMEROUS TO COUNT  <3 (RBC/hpf)    Bacteria, UA FEW (*) RARE    CBC     Status: Abnormal   Collection Time   07/20/11  7:22 AM      Component Value Range Comment   WBC 7.9  4.0 - 10.5 (K/uL)    RBC 4.19 (*) 4.22 - 5.81 (MIL/uL)    Hemoglobin 9.2 (*) 13.0 - 17.0 (g/dL)    HCT 91.4 (*) 78.2 - 52.0 (%)    MCV 73.5 (*) 78.0 - 100.0 (fL)    MCH 22.0 (*) 26.0 - 34.0 (pg)    MCHC 29.9 (*) 30.0 - 36.0 (g/dL)    RDW 95.6 (*) 21.3 - 15.5 (%)    Platelets 601 (*) 150 - 400 (K/uL)   DIFFERENTIAL     Status: Abnormal   Collection Time   07/20/11  7:22 AM      Component Value Range Comment    Neutrophils Relative 66  43 - 77 (%)    Neutro Abs 5.2  1.7 - 7.7 (K/uL)    Lymphocytes Relative 19  12 - 46 (%)    Lymphs Abs 1.5  0.7 - 4.0 (K/uL)    Monocytes Relative 7  3 - 12 (%)    Monocytes Absolute 0.5  0.1 - 1.0 (K/uL)    Eosinophils Relative 8 (*) 0 - 5 (%)    Eosinophils Absolute 0.6  0.0 - 0.7 (K/uL)    Basophils Relative 1  0 - 1 (%)  Basophils Absolute 0.1  0.0 - 0.1 (K/uL)    RBC Morphology POLYCHROMASIA PRESENT     COMPREHENSIVE METABOLIC PANEL     Status: Abnormal   Collection Time   07/20/11  7:22 AM      Component Value Range Comment   Sodium 140  135 - 145 (mEq/L)    Potassium 4.3  3.5 - 5.1 (mEq/L)    Chloride 105  96 - 112 (mEq/L)    CO2 29  19 - 32 (mEq/L)    Glucose, Bld 91  70 - 99 (mg/dL)    BUN 15  6 - 23 (mg/dL)    Creatinine, Ser 9.60  0.50 - 1.35 (mg/dL)    Calcium 9.1  8.4 - 10.5 (mg/dL)    Total Protein 6.3  6.0 - 8.3 (g/dL)    Albumin 3.0 (*) 3.5 - 5.2 (g/dL)    AST 23  0 - 37 (U/L)    ALT 20  0 - 53 (U/L)    Alkaline Phosphatase 74  39 - 117 (U/L)    Total Bilirubin 0.3  0.3 - 1.2 (mg/dL)    GFR calc non Af Amer 67 (*) >90 (mL/min)    GFR calc Af Amer 77 (*) >90 (mL/min)    Ct Abdomen Pelvis W Contrast  07/19/2011  *RADIOLOGY REPORT*  Clinical Data: Abdominal pain, nausea.  Status post partial colectomy.  CT ABDOMEN AND PELVIS WITH CONTRAST  Technique:  Multidetector CT imaging of the abdomen and pelvis was performed following the standard protocol during bolus administration of intravenous contrast.  Contrast: OMNIPAQUE IOHEXOL 300 MG/ML IV SOLN  Comparison: 07/03/2011  Findings: Dependent atelectasis in the visualized lung bases.  Unremarkable liver, gallbladder, spleen, adrenal glands, pancreas.  Stable cyst from the upper pole left kidney.  No hydronephrosis.  Normal renal parenchymal enhancement.  Unremarkable aorta.  Stomach is nondistended.  There are multiple dilated small bowel loops with poor progression of the oral contrast  material distally.  The transition point is in the right lower quadrant. Changes of right hemicolectomy are evident with a staple line in the right mid abdomen.  There is a 5.2 cm fairly well marginated gas and fluid collection medial to the staple line, near the level of the small bowel transition point, suggesting postop abscess.  There is a less well-defined 3 cm gas and fluid collection in the right lower quadrant anterior abdominal wall, at the deep margin of the incision.  Remaining colon is decompressed, unremarkable.  There is a small amount free pelvic fluid without evident loculation or peripheral enhancement. Urinary bladder is incompletely distended.  There is moderate prostatic enlargement with TURP defect.  Patchy iliac arterial calcifications.  No free air.  Laparoscopic ventral hernia mesh retention sutures are again noted.  Mild degenerative disc disease L5-S1.  IMPRESSION:  1.  Probable 5.2 cm abscess in the right lower quadrant medial to the proximal ascending colon. 2.  Mid small bowel obstruction with transition point near the level of the right lower quadrant abscess. 3.  Smaller 3 cm gas and fluid collection possibly small abscess in the right lower quadrant anterior abdominal wall.  Original Report Authenticated By: Osa Craver, M.D.    Review of Systems  Constitutional: Positive for weight loss and malaise/fatigue. Negative for fever, chills and diaphoresis.  HENT: Negative.   Eyes: Negative.   Respiratory: Negative.   Cardiovascular: Negative.   Gastrointestinal: Positive for nausea, vomiting, abdominal pain (mild) and diarrhea. Negative for heartburn, constipation,  blood in stool and melena.  Genitourinary: Positive for dysuria, urgency and frequency. Negative for hematuria and flank pain.  Musculoskeletal: Negative.   Skin: Negative.   Neurological: Negative.  Negative for weakness.  Endo/Heme/Allergies: Negative.   Psychiatric/Behavioral: Negative.     Blood  pressure 120/66, pulse 62, temperature 98 F (36.7 C), temperature source Oral, resp. rate 18, height 6\' 3"  (1.905 m), weight 98.884 kg (218 lb), SpO2 93.00%. Physical Exam  Constitutional: He is oriented to person, place, and time. He appears well-developed and well-nourished. No distress.  HENT:  Head: Normocephalic and atraumatic.  Eyes: Conjunctivae and EOM are normal. Pupils are equal, round, and reactive to light.  Neck: Normal range of motion. Neck supple. No tracheal deviation present. No thyromegaly present.  Cardiovascular: Normal rate, regular rhythm and normal heart sounds.   Respiratory: Effort normal and breath sounds normal. No respiratory distress.  GI: Soft. Bowel sounds are normal. He exhibits no distension and no mass. There is no tenderness. There is no rebound and no guarding.       Incision well approximated. Steri-Strips are in place. No erythema is noted.  Lymphadenopathy:    He has no cervical adenopathy.  Neurological: He is alert and oriented to person, place, and time.  Skin: Skin is warm and dry.     Assessment/Plan Dehydration, intra-abdominal fluid collections, suspected urinary tract infection. At this point patient is being admitted for IV fluid hydration and empiric coverage with antibiotics. Due to patient's recent operations I am suspicious of both the potential intra-abdominal process and/or a urinary tract infection. We'll continue the IV antibiotics at this time.  Trial the patient on clear liquids. I did reassure the patient and I do not feel that patient will likely need either percutaneous drainage or return trip to the operating room. Should his symptomatology continue to improve we may be able to consider discharge to home tomorrow after advancement of diet. In regards to the diarrhea this is likely more related to surgery and infectious etiology however should it persist stool samples will be sent for C. difficile evaluation.  Nikkole Placzek  C 07/20/2011, 1:08 PM

## 2011-07-20 NOTE — Progress Notes (Signed)
Writer made Dr. Leticia Penna aware of pt's hematuria with a blood clot.  Stated he may have UTI related with his other medical problems.  Will continue to monitor.  No further orders received.

## 2011-07-21 LAB — URINE CULTURE
Colony Count: >=100000
Culture  Setup Time: 201302162001

## 2011-07-21 MED ORDER — KCL IN DEXTROSE-NACL 20-5-0.45 MEQ/L-%-% IV SOLN
INTRAVENOUS | Status: DC
  Administered 2011-07-21: 10:00:00 via INTRAVENOUS

## 2011-07-21 MED ORDER — ALUM & MAG HYDROXIDE-SIMETH 200-200-20 MG/5ML PO SUSP
30.0000 mL | ORAL | Status: DC | PRN
  Administered 2011-07-21 – 2011-07-22 (×3): 30 mL via ORAL
  Filled 2011-07-21 (×3): qty 30

## 2011-07-21 MED ORDER — SODIUM CHLORIDE 0.9 % IJ SOLN
INTRAMUSCULAR | Status: AC
  Administered 2011-07-21: 10 mL
  Filled 2011-07-21: qty 3

## 2011-07-21 NOTE — Progress Notes (Signed)
UR Chart Review Completed  

## 2011-07-21 NOTE — Progress Notes (Signed)
Patient appears to be very depressed, ordered spiritual care consult.  Chaplain stated that she seen him with his last admission two weeks ago and feels he was quite depressed then as well.  Chaplain does plan to revisit this admission.  Will continue to monitor and support patient as needed.  Schonewitz, Candelaria Stagers 07/21/2011

## 2011-07-21 NOTE — Progress Notes (Signed)
After several attempts at getting pharmacy to correct Toradol orders, administered medication to patient. Will monitor for pain control.

## 2011-07-21 NOTE — Progress Notes (Signed)
Wanted medication for pain. Did not like how morphine made him feel. [place] ordered PO today. Didn't want Po med because he thought he would just throw it up. Dr Leticia Penna paged. Orders received for Toradol 30 mg IV q 8 hours x 6 doses.

## 2011-07-21 NOTE — Progress Notes (Signed)
Subjective: Feels better. Having intermittent epigastric abdominal pain. Nausea is intermittent in nature.  Objective: Vital signs in last 24 hours: Temp:  [97.8 F (36.6 C)-98.3 F (36.8 C)] 98 F (36.7 C) (02/18 0600) Pulse Rate:  [62-76] 62  (02/18 0600) Resp:  [18-20] 18  (02/18 0600) BP: (104-158)/(61-72) 104/61 mmHg (02/18 0600) SpO2:  [91 %-95 %] 95 % (02/18 0600) Last BM Date: 07/20/11  Intake/Output from previous day: 02/17 0701 - 02/18 0700 In: 3517.5 [I.V.:3517.5] Out: 400 [Urine:400] Intake/Output this shift:    General appearance: alert, cooperative and no distress Resp: clear to auscultation bilaterally Cardio: regular rate and rhythm, S1, S2 normal, no murmur, click, rub or gallop GI: Abdomen soft, nondistended. Incision healing well. No purulent drainage noted. No rigidity noted. Bowel sounds present.  Lab Results:   Basename 07/20/11 0722 07/19/11 1205  WBC 7.9 9.8  HGB 9.2* 10.3*  HCT 30.8* 35.1*  PLT 601* 748*   BMET  Basename 07/20/11 0722 07/19/11 1205  NA 140 136  K 4.3 4.1  CL 105 98  CO2 29 27  GLUCOSE 91 146*  BUN 15 19  CREATININE 1.12 0.99  CALCIUM 9.1 10.1   PT/INR No results found for this basename: LABPROT:2,INR:2 in the last 72 hours  Studies/Results: Ct Abdomen Pelvis W Contrast  07/19/2011  *RADIOLOGY REPORT*  Clinical Data: Abdominal pain, nausea.  Status post partial colectomy.  CT ABDOMEN AND PELVIS WITH CONTRAST  Technique:  Multidetector CT imaging of the abdomen and pelvis was performed following the standard protocol during bolus administration of intravenous contrast.  Contrast: OMNIPAQUE IOHEXOL 300 MG/ML IV SOLN  Comparison: 07/03/2011  Findings: Dependent atelectasis in the visualized lung bases.  Unremarkable liver, gallbladder, spleen, adrenal glands, pancreas.  Stable cyst from the upper pole left kidney.  No hydronephrosis.  Normal renal parenchymal enhancement.  Unremarkable aorta.  Stomach is nondistended.   There are multiple dilated small bowel loops with poor progression of the oral contrast material distally.  The transition point is in the right lower quadrant. Changes of right hemicolectomy are evident with a staple line in the right mid abdomen.  There is a 5.2 cm fairly well marginated gas and fluid collection medial to the staple line, near the level of the small bowel transition point, suggesting postop abscess.  There is a less well-defined 3 cm gas and fluid collection in the right lower quadrant anterior abdominal wall, at the deep margin of the incision.  Remaining colon is decompressed, unremarkable.  There is a small amount free pelvic fluid without evident loculation or peripheral enhancement. Urinary bladder is incompletely distended.  There is moderate prostatic enlargement with TURP defect.  Patchy iliac arterial calcifications.  No free air.  Laparoscopic ventral hernia mesh retention sutures are again noted.  Mild degenerative disc disease L5-S1.  IMPRESSION:  1.  Probable 5.2 cm abscess in the right lower quadrant medial to the proximal ascending colon. 2.  Mid small bowel obstruction with transition point near the level of the right lower quadrant abscess. 3.  Smaller 3 cm gas and fluid collection possibly small abscess in the right lower quadrant anterior abdominal wall.  Original Report Authenticated By: Osa Craver, M.D.    Anti-infectives: Anti-infectives     Start     Dose/Rate Route Frequency Ordered Stop   07/20/11 1800  piperacillin-tazobactam (ZOSYN) IVPB 3.375 g       3.375 g 100 mL/hr over 30 Minutes Intravenous Every 8 hours 07/20/11 1650  07/19/11 1530   ciprofloxacin (CIPRO) IVPB 400 mg        400 mg 200 mL/hr over 60 Minutes Intravenous  Once 07/19/11 1530 07/19/11 1641          Assessment/Plan:  LOS: 2 days  Impression: Patient overall feels better. C. difficile PCR negative. Urine culture pending. Bowel function is improving. Given the fact that  he is 3 weeks out from his colectomy and his white blood cell count is normal, I would not drain the intra-abdominal fluid collection yet. This was discussed with the patient and wife. We'll advance his diet. We'll continue treating his urinary tract infection. He states he was passing some clots of blood while urinating, but this has been resolving.   William Burns A 07/21/2011

## 2011-07-21 NOTE — Progress Notes (Signed)
Called urine culture report to MD.  No new orders received at this time.  Will continue to monitor patient.  Schonewitz, Candelaria Stagers 07/21/2011

## 2011-07-22 ENCOUNTER — Encounter (HOSPITAL_COMMUNITY): Payer: Self-pay | Admitting: Urology

## 2011-07-22 MED ORDER — SODIUM CHLORIDE 0.9 % IJ SOLN
INTRAMUSCULAR | Status: AC
  Administered 2011-07-22: 10 mL
  Filled 2011-07-22: qty 3

## 2011-07-22 MED ORDER — HYDROCODONE-ACETAMINOPHEN 5-325 MG PO TABS: 1.0000 | ORAL_TABLET | ORAL | Status: DC | PRN

## 2011-07-22 MED ORDER — CIPROFLOXACIN HCL 750 MG PO TABS: 750.0000 mg | ORAL_TABLET | Freq: Two times a day (BID) | ORAL | Status: AC

## 2011-07-22 MED ORDER — PROMETHAZINE HCL 50 MG PO TABS: 25.0000 mg | ORAL_TABLET | Freq: Four times a day (QID) | ORAL | Status: DC | PRN

## 2011-07-22 NOTE — Progress Notes (Signed)
IV removed, site WNL.  Pt given d/c instructions and new prescriptions.  Discussed home care with patient and discussed home medications, patient verbalizes understanding. F/U appointment in place, pt states they will keep appointment. Pt is stable at this time. Teach back regarding new medications done.

## 2011-07-22 NOTE — Discharge Summary (Signed)
Physician Discharge Summary  Patient ID: William Burns MRN: [id number] DOB/AGE: [birth year] 66 y.o.  Admit date: 07/19/2011 Discharge date: 07/22/2011  Admission Diagnoses: Nausea, vomiting, abdominal pain  Discharge Diagnoses: Urinary tract infection Active Problems:  * No active hospital problems. *    Discharged Condition: good  Hospital Course: Patient is a 66 year old white male status post a right hemicolectomy for colon carcinoma as well as a TURP for benign prostatic hypertrophy approximately 2 weeks ago who presented emergency room with worsening nausea, vomiting, abdominal pain. CT scan of the abdomen and pelvis revealed a fluid collection in the right lower quadrant. A urine culture subsequently revealed enterococcal infection. He was started empirically on Zosyn. He is significantly improved since his admission. It was difficult to certain whether the intra-abdominal fluid collection was a resolving hematoma or infection. He did not have a leukocytosis. He is being discharged home in good improving condition  Consults: None  Treatments: antibiotics: Zosyn  Discharge Exam: Blood pressure 118/68, pulse 72, temperature 98 F (36.7 C), temperature source Oral, resp. rate 20, height 6\' 3"  (1.905 m), weight 98.884 kg (218 lb), SpO2 93.00%. General appearance: alert, cooperative and no distress Resp: clear to auscultation bilaterally Cardio: regular rate and rhythm, S1, S2 normal, no murmur, click, rub or gallop GI: Soft, nontender, nondistended. Incision line healing well without drainage the  Disposition: 01-Home or Self Care  Discharge Orders    Future Appointments: Provider: Department: Dept Phone: Center:   07/30/2011 3:00 PM Randall An, MD [facility] [phone] None     Medication List  As of 07/22/2011 10:11 AM   TAKE these medications         ciprofloxacin 750 MG tablet   Commonly known as: CIPRO   Take 1 tablet (750 mg total) by mouth 2 (two) times  daily.      FISH OIL TRIPLE STRENGTH PO   Take 1 capsule by mouth daily.      HYDROcodone-acetaminophen 5-325 MG per tablet   Commonly known as: NORCO   Take 1-2 tablets by mouth every 4 (four) hours as needed for pain.      mulitivitamin with minerals Tabs   Take 1 tablet by mouth daily. MEGA MENS 50+ MULTIVITAMIN      penicillin v potassium 500 MG tablet   Commonly known as: VEETID   Take 500 mg by mouth 2 (two) times daily.      promethazine 50 MG tablet   Commonly known as: PHENERGAN   Take 0.5 tablets (25 mg total) by mouth every 6 (six) hours as needed for nausea.           Follow-up Information    Follow up with Dalia Heading, MD on 07/29/2011. (Keep appt as previously scheduled)    Contact information:   [address] [place] [address] [phone]          Signed: Dalia Heading 07/22/2011, 10:11 AM

## 2011-07-22 NOTE — Discharge Instructions (Signed)

## 2011-07-23 ENCOUNTER — Ambulatory Visit (HOSPITAL_COMMUNITY): Payer: BC Managed Care – PPO | Admitting: Oncology

## 2011-07-29 DIAGNOSIS — N400 Enlarged prostate without lower urinary tract symptoms: Secondary | ICD-10-CM | POA: Diagnosis not present

## 2011-07-30 ENCOUNTER — Encounter (HOSPITAL_COMMUNITY): Payer: Medicare Other | Attending: Oncology | Admitting: Oncology

## 2011-07-30 DIAGNOSIS — C180 Malignant neoplasm of cecum: Secondary | ICD-10-CM | POA: Diagnosis not present

## 2011-07-30 DIAGNOSIS — C189 Malignant neoplasm of colon, unspecified: Secondary | ICD-10-CM | POA: Diagnosis not present

## 2011-07-30 DIAGNOSIS — N400 Enlarged prostate without lower urinary tract symptoms: Secondary | ICD-10-CM | POA: Diagnosis not present

## 2011-07-30 DIAGNOSIS — Z87891 Personal history of nicotine dependence: Secondary | ICD-10-CM

## 2011-07-30 LAB — COMPREHENSIVE METABOLIC PANEL
ALT: 16 U/L (ref 0–53)
Albumin: 3.5 g/dL (ref 3.5–5.2)
Calcium: 9.3 mg/dL (ref 8.4–10.5)
GFR calc Af Amer: >90 mL/min (ref >90)
Glucose, Bld: 99 mg/dL (ref 70–99)
Potassium: 3.9 mEq/L (ref 3.5–5.1)
Sodium: 141 mEq/L (ref 135–145)
Total Protein: 7.3 g/dL (ref 6.0–8.3)

## 2011-07-30 LAB — CBC
Hemoglobin: 10.5 g/dL — ABNORMAL LOW (ref 13.0–17.0)
MCH: 21.8 pg — ABNORMAL LOW (ref 26.0–34.0)
MCHC: 29.9 g/dL — ABNORMAL LOW (ref 30.0–36.0)
Platelets: 358 10*3/uL (ref 150–400)
RDW: 19.7 % — ABNORMAL HIGH (ref 11.5–15.5)

## 2011-07-30 NOTE — Progress Notes (Signed)
CC:   William Burns, M.D. Lionel December, M.D. Dalia Heading, M.D.  DIAGNOSES: 1. Stage IIA (T3 N0) adenocarcinoma of the colon occurring in the     cecum which was felt to be a grade 2 tumor, 8.5 cm, with     pericolonic connective tissue involvement but all margins negative.     No lymphovascular invasion.  Twenty-eight lymph nodes were     examined, all of which were negative for metastatic disease.  He is     status post surgery on 07/07/2011. 2. Benign prostatic hypertrophy with a very large prostate operate on     by Dr. Jerre Simon shortly after his colon cancer surgery. 3. Possible enterococcal urinary tract infection. 4. Fluid collection in the right lower abdominal wall musculature,     felt to be an abscess on the CT scan dated 07/19/2011. 5. Anemia at the time of presentation was microcytic. 6. History of gastritis in the past. 7. History of bladder calculus from calcium oxalate stone.  HISTORY OF PRESENT ILLNESS:  This is a very, very pleasant, 66 year old gentleman who started feeling weak about 2 months before this diagnosis. Several weeks before, he became very weak and a little short winded, but his muscles in his legs felt very heavy.  He had blood work eventually that showed a hemoglobin 7.8 and MCV right around 70.  He had a normal CEA level.  He underwent colonoscopy by Dr. Karilyn Cota which showed a circumferential lesion in the cecum.  He underwent surgery, as mentioned, by Dr. Franky Macho.  He is recuperating from that.  He was treated recently for a bladder infection but it was also felt that he might have a pus pocket in the right lower quadrant medial to the staple line, suggesting a postop abscess.  There was also a less well-defined 3 cm gas and fluid collection in the right lower quadrant anterior abdominal wall at the deep margin of the incision.  I will discuss this with Dr. Lovell Sheehan.  He is not having fevers now, but he was placed on a Z-Pak 4 days ago  for temperature of a 101 or just under that.  He has come down with nasal congestion.  He is bringing up a little yellowish material from his lungs and sinuses.  At the time of the fever, he was bringing up only clear phlegm.  He has not had chills.  He is alert.  He is oriented today and feeling better, he states.  His appetite has still not returned.  He weighed 250 pounds prior to his diagnosis and his weight had actually been very stable.  Here today, he is 216 pounds.  He is 6 feet and 3 inches tall.  He wants to aim for about 220 pounds in maximum weight, which I think is more than enough at this time.  SOCIAL HISTORY:  He has been divorced.  He did raise his 2 sons, who are alive in good health, ages 19 and 77.  He used to smoke 1-1/2 packs a day for 27 years, quitting in 1992.  He does not use alcohol.  FAMILY HISTORY:  His parents are both deceased from Alzheimer disease in their 17s.  REVIEW OF SYSTEMS:  Presently, noncontributory.  PHYSICAL EXAMINATION:  General Appearance:  He is a 6 feet and 3 inches tall gentleman in no acute distress.  He is Caucasian.  Vital Signs: His blood pressure 138/68 in the right arm, sitting position.  Pulse is 80 and regular.  Respirations are 16-18 and unlabored.  He is afebrile. He denies pain presently.  Lymph nodes:  He has no lymphadenopathy. HEENT:  Teeth are in fair repair.  He does have partial plates.  Tongue is unremarkable.  Pupils are equally round and reactive to light.  Neck: He has no thyromegaly.  Lungs: Clear but with diminished breath sounds throughout.  He does have mild hyperresonance to percussion.  Heart: Regular rhythm and rate without murmur, rub, or gallop.  Chest:  He has no gynecomastia.  Abdomen:  Soft and nontender, except right around the incision where there is minimal tenderness to direct palpation.  No hepatosplenomegaly is noted.  Extremities:  He has no peripheral edema. Pulses are 1+ in his feet and  symmetrical.  He is right-handed.  Nails are unremarkable.  There is no clubbing.  ASSESSMENT AND PLAN:  This gentleman has had a CT of the abdomen and pelvis, of course, x2 now with no evidence for metastatic disease and what appears to be a pathological stage IIA tumor not in need of further chemotherapy.  He does need observation, however.  I think he should have a CEA every 3 months for 5 years.  I think he should have a CT of the chest, abdomen, and pelvis once a year for the next 3 years and then thereafter on a p.r.n. basis.  With his history of smoking, I think we need to order the CT of the chest.  He has had the other CAT scans as mentioned above.  We will set that up for about 1-1/2 weeks.  I will talk to Dr. Lovell Sheehan who has already released him, he states, just about that most recent CT scan.  I have asked him to call Dr. Margo Aye or Dr. Lovell Sheehan should he get another fever greater than 100 degrees.  I will see him in a year or sooner if need be.  We will check his iron studies today and start his CEAs every 3 months today.  PROGNOSIS:  His prognosis is very good.  I did discuss his CT findings with Dr Lovell Sheehan who felt that these areas in lower abd.were not abcesses.    ______________________________ Ladona Horns. Mariel Sleet, MD ESN/MEDQ  D:  07/30/2011  T:  07/30/2011  Job:  [id number]

## 2011-07-30 NOTE — Progress Notes (Signed)
William Burns presented for labwork. Labs per MD order drawn via Peripheral Line 23 gauge needle inserted in left AC  Good blood return present. Procedure without incident.  Needle removed intact. Patient tolerated procedure well.   

## 2011-07-30 NOTE — Patient Instructions (Signed)
William Burns  [id number] 02-Oct-1945   [facility]  Discharge Instructions  RECOMMENDATIONS MADE BY THE CONSULTANT AND ANY TEST RESULTS WILL BE SENT TO YOUR REFERRING DOCTOR.   EXAM FINDINGS BY MD TODAY AND SIGNS AND SYMPTOMS TO REPORT TO CLINIC OR PRIMARY MD: Will do some baseline blood work today.  Will also do a CT of your chest due to your smoking history.  For monitoring your colon cancer we will check your CEA every 3 months.  MEDICATIONS PRESCRIBED: none   INSTRUCTIONS GIVEN AND DISCUSSED: Other :  Report changes in bowel habits, blood in your stool, shortness of breath, etc.  SPECIAL INSTRUCTIONS/FOLLOW-UP: Lab work Needed today and every 3 weeks, Xray Studies Needed CT of Chest  and Return to Clinic in 1 year to see Dr. Mariel Sleet.   I acknowledge that I have been informed and understand all the instructions given to me and received a copy. I do not have any more questions at this time, but understand that I may call the [facility] at [facility] at [phone] during business hours should I have any further questions or need assistance in obtaining follow-up care.    __________________________________________  _____________  __________ Signature of Patient or Authorized Representative            Date                   Time    __________________________________________ Nurse's Signature

## 2011-07-31 LAB — IRON AND TIBC
Iron: 18 ug/dL — ABNORMAL LOW (ref 42–135)
Saturation Ratios: 5 % — ABNORMAL LOW (ref 20–55)
TIBC: 368 ug/dL (ref 215–435)

## 2011-08-01 ENCOUNTER — Telehealth (HOSPITAL_COMMUNITY): Payer: Self-pay | Admitting: *Deleted

## 2011-08-01 NOTE — Telephone Encounter (Signed)
Message copied by Dennie Maizes on Fri Aug 01, 2011 10:02 AM ------      Message from: Mariel Sleet, ERIC S      Created: Fri Aug 01, 2011  9:46 AM       Ask him to take one OTC ferrous sulfate 325 mg 3 X per week for 8 weeks and then stop.

## 2011-08-01 NOTE — Telephone Encounter (Signed)
Spoke with pt as below. Verbalized understanding. He states he will call back if any questions or concerns.

## 2011-08-01 NOTE — Telephone Encounter (Signed)
Message copied by Dennie Maizes on Fri Aug 01, 2011  9:57 AM ------      Message from: Mariel Sleet, ERIC S      Created: Fri Aug 01, 2011  9:46 AM       Ask him to take one OTC ferrous sulfate 325 mg 3 X per week for 8 weeks and then stop.

## 2011-08-11 ENCOUNTER — Ambulatory Visit (HOSPITAL_COMMUNITY)
Admission: RE | Admit: 2011-08-11 | Discharge: 2011-08-11 | Disposition: A | Discharge status: Home / Self Care | Payer: Medicare Other | Source: Ambulatory Visit | Attending: Oncology | Admitting: Oncology

## 2011-08-11 DIAGNOSIS — R918 Other nonspecific abnormal finding of lung field: Secondary | ICD-10-CM | POA: Diagnosis not present

## 2011-08-11 DIAGNOSIS — Z87891 Personal history of nicotine dependence: Secondary | ICD-10-CM

## 2011-08-11 DIAGNOSIS — C189 Malignant neoplasm of colon, unspecified: Secondary | ICD-10-CM | POA: Diagnosis not present

## 2011-08-11 DIAGNOSIS — J984 Other disorders of lung: Secondary | ICD-10-CM | POA: Diagnosis not present

## 2011-08-14 NOTE — Progress Notes (Signed)
Iven Finn, MS, RD, LDN Pager [phone]

## 2011-08-25 ENCOUNTER — Telehealth (INDEPENDENT_AMBULATORY_CARE_PROVIDER_SITE_OTHER): Payer: Self-pay | Admitting: *Deleted

## 2011-08-25 NOTE — Telephone Encounter (Signed)
Cordelia Pen called and ask if they are to continue the Omeprazole? Dr. Lovell Sheehan had given him a prescription for this and he has completed 1 month of it. She ask that we call her back at [phone].

## 2011-08-25 NOTE — Telephone Encounter (Signed)
He can stop Prilosec

## 2011-09-02 DIAGNOSIS — M79609 Pain in unspecified limb: Secondary | ICD-10-CM | POA: Diagnosis not present

## 2011-09-02 DIAGNOSIS — M722 Plantar fascial fibromatosis: Secondary | ICD-10-CM | POA: Diagnosis not present

## 2011-09-03 DIAGNOSIS — N400 Enlarged prostate without lower urinary tract symptoms: Secondary | ICD-10-CM | POA: Diagnosis not present

## 2011-09-03 DIAGNOSIS — N320 Bladder-neck obstruction: Secondary | ICD-10-CM | POA: Diagnosis not present

## 2011-09-23 DIAGNOSIS — M722 Plantar fascial fibromatosis: Secondary | ICD-10-CM | POA: Diagnosis not present

## 2011-09-23 DIAGNOSIS — M79609 Pain in unspecified limb: Secondary | ICD-10-CM | POA: Diagnosis not present

## 2011-10-28 ENCOUNTER — Encounter (HOSPITAL_COMMUNITY): Payer: Medicare Other | Attending: Oncology

## 2011-10-28 DIAGNOSIS — N400 Enlarged prostate without lower urinary tract symptoms: Secondary | ICD-10-CM | POA: Diagnosis not present

## 2011-10-28 DIAGNOSIS — C189 Malignant neoplasm of colon, unspecified: Secondary | ICD-10-CM | POA: Insufficient documentation

## 2011-10-28 LAB — CEA: CEA: 1.6 ng/mL (ref 0.0–5.0)

## 2011-11-06 DIAGNOSIS — L570 Actinic keratosis: Secondary | ICD-10-CM | POA: Diagnosis not present

## 2011-11-06 DIAGNOSIS — L408 Other psoriasis: Secondary | ICD-10-CM | POA: Diagnosis not present

## 2011-11-06 DIAGNOSIS — D235 Other benign neoplasm of skin of trunk: Secondary | ICD-10-CM | POA: Diagnosis not present

## 2012-01-07 ENCOUNTER — Encounter (INDEPENDENT_AMBULATORY_CARE_PROVIDER_SITE_OTHER): Payer: Self-pay | Admitting: *Deleted

## 2012-01-28 ENCOUNTER — Encounter (HOSPITAL_COMMUNITY): Payer: Medicare Other | Attending: Oncology

## 2012-01-28 DIAGNOSIS — C189 Malignant neoplasm of colon, unspecified: Secondary | ICD-10-CM | POA: Insufficient documentation

## 2012-01-28 NOTE — Progress Notes (Addendum)
Labs drawn today for cea . Call patient at [phone] on cell with results

## 2012-02-09 ENCOUNTER — Ambulatory Visit (INDEPENDENT_AMBULATORY_CARE_PROVIDER_SITE_OTHER): Payer: Medicare Other | Admitting: Internal Medicine

## 2012-02-09 ENCOUNTER — Encounter (INDEPENDENT_AMBULATORY_CARE_PROVIDER_SITE_OTHER): Payer: Self-pay | Admitting: Internal Medicine

## 2012-02-09 VITALS — BP 140/80 | HR 82 | Temp 98.3°F | Resp 20 | Ht 75.0 in | Wt 238.5 lb

## 2012-02-09 DIAGNOSIS — R197 Diarrhea, unspecified: Secondary | ICD-10-CM

## 2012-02-09 DIAGNOSIS — Z85038 Personal history of other malignant neoplasm of large intestine: Secondary | ICD-10-CM

## 2012-02-09 DIAGNOSIS — K2270 Barrett's esophagus without dysplasia: Secondary | ICD-10-CM

## 2012-02-09 DIAGNOSIS — Z86004 Personal history of in-situ neoplasm of other and unspecified digestive organs: Secondary | ICD-10-CM

## 2012-02-09 MED ORDER — LOPERAMIDE HCL 2 MG PO TABS: 2.0000 mg | ORAL_TABLET | Freq: Every day | ORAL | Status: AC | PRN

## 2012-02-09 MED ORDER — PANTOPRAZOLE SODIUM 40 MG PO TBEC: 40.0000 mg | DELAYED_RELEASE_TABLET | Freq: Every day | ORAL | Status: DC

## 2012-02-09 NOTE — Patient Instructions (Addendum)
Align or equivalent 1 capsule by mouth daily. Anti-reflux measures. Colonoscopy to be scheduled in February 2014.

## 2012-02-09 NOTE — Progress Notes (Signed)
Presenting complaint;  Followup for Barrett's esophagus and history of colon cancer. Subjective:  William Burns is 66 year old Caucasian male who is here for scheduled visit accompanied by his wife. He presented back in January 2013 with profound anemia and GI bleed. EGD revealed short segment Barrett's esophagus. H. pylori serology was negative. Colonoscopy revealed large ascending colon mass which was adenocarcinoma. He underwent right, colectomy. 20 8/28 lymph nodes were negative and lesion was T3, N0, M0 lesion. She also underwent  TURP during that hospitalization. He feels fine. He denies chronic heartburn regurgitation or hoarseness. He states he is had almost full recovery from what he went through in January and February 2013. He is having intermittent diarrhea no more than twice a week. Other days his stools are normal. He denies abdominal pain melena or rectal bleeding. He also denies dysphagia. He is also being followed by Dr. Mariel Sleet and getting serial CEA levels and these have remained normal.  Current Medications: Current Outpatient Prescriptions  Medication Sig Dispense Refill  . Multiple Vitamin (MULITIVITAMIN WITH MINERALS) TABS Take 1 tablet by mouth daily. MEGA MENS 50+ MULTIVITAMIN      . Omega-3 Fatty Acids (FISH OIL TRIPLE STRENGTH PO) Take 1 capsule by mouth daily.      . promethazine (PHENERGAN) 50 MG tablet Take 0.5 tablets (25 mg total) by mouth every 6 (six) hours as needed for nausea.  30 tablet  0  . DISCONTD: promethazine (PHENERGAN) 50 MG tablet Take 50 mg by mouth every 6 (six) hours as needed. Takes half tab twice daily         Objective: Blood pressure 140/80, pulse 82, temperature 98.3 F (36.8 C), temperature source Oral, resp. rate 20, height 6\' 3"  (1.905 m), weight 238 lb 8 oz (108.183 kg). Patient is alert and in no acute distress. Conjunctiva is pink. Sclera is nonicteric Oropharyngeal mucosa is normal. No neck masses or thyromegaly noted. Cardiac exam  with regular rhythm normal S1 and S2. No murmur or gallop noted. Lungs are clear to auscultation. Abdomen is symmetrical soft and nontender without organomegaly or masses. No LE edema or clubbing noted.  Labs/studies Results: CEA level on 01/28/2012 was 1.5  Assessment:  #1. Short segment Barrett's esophagus initially discovered on EGD of January 2013. Biopsy was negative for dysplasia. Suspect he is a silent refluxer and it is in his best interest to go back on PPI and stay on it chronically. Will consider EGD in few years. #2. Intermittent diarrhea. Suspect this may be secondary to colonic surgery. At this point nothing to suggest small bowel bacterial overgrowth. #3. History of colon carcinoma. Status post right hemicolectomy in February 2013 and he remains in remission.   Plan: Anti-reflux measures reinforced. Pantoprazole 40 mg by mouth every morning. Align or equivalent 1 capsule by mouth daily. Imodium OTC 2 mg daily when necessary. Surveillance colonoscopy in February 2014. Office visit in one year.

## 2012-03-03 DIAGNOSIS — R972 Elevated prostate specific antigen [PSA]: Secondary | ICD-10-CM | POA: Diagnosis not present

## 2012-03-11 DIAGNOSIS — R972 Elevated prostate specific antigen [PSA]: Secondary | ICD-10-CM | POA: Diagnosis not present

## 2012-04-19 DIAGNOSIS — N400 Enlarged prostate without lower urinary tract symptoms: Secondary | ICD-10-CM | POA: Diagnosis not present

## 2012-04-19 DIAGNOSIS — R972 Elevated prostate specific antigen [PSA]: Secondary | ICD-10-CM | POA: Diagnosis not present

## 2012-04-19 DIAGNOSIS — C189 Malignant neoplasm of colon, unspecified: Secondary | ICD-10-CM | POA: Diagnosis not present

## 2012-04-27 ENCOUNTER — Encounter (HOSPITAL_COMMUNITY): Payer: Medicare Other | Attending: Oncology

## 2012-04-27 DIAGNOSIS — C180 Malignant neoplasm of cecum: Secondary | ICD-10-CM | POA: Diagnosis not present

## 2012-04-27 DIAGNOSIS — C189 Malignant neoplasm of colon, unspecified: Secondary | ICD-10-CM | POA: Insufficient documentation

## 2012-04-27 NOTE — Progress Notes (Signed)
Labs drawn today for cea 

## 2012-05-19 DIAGNOSIS — H524 Presbyopia: Secondary | ICD-10-CM | POA: Diagnosis not present

## 2012-05-19 DIAGNOSIS — H2510 Age-related nuclear cataract, unspecified eye: Secondary | ICD-10-CM | POA: Diagnosis not present

## 2012-05-19 DIAGNOSIS — H52229 Regular astigmatism, unspecified eye: Secondary | ICD-10-CM | POA: Diagnosis not present

## 2012-05-19 DIAGNOSIS — H5200 Hypermetropia, unspecified eye: Secondary | ICD-10-CM | POA: Diagnosis not present

## 2012-07-01 ENCOUNTER — Encounter (INDEPENDENT_AMBULATORY_CARE_PROVIDER_SITE_OTHER): Payer: Self-pay | Admitting: *Deleted

## 2012-07-19 DIAGNOSIS — H2589 Other age-related cataract: Secondary | ICD-10-CM | POA: Diagnosis not present

## 2012-07-19 DIAGNOSIS — H25099 Other age-related incipient cataract, unspecified eye: Secondary | ICD-10-CM | POA: Diagnosis not present

## 2012-07-21 ENCOUNTER — Encounter (HOSPITAL_COMMUNITY): Payer: Self-pay | Admitting: Pharmacy Technician

## 2012-07-28 ENCOUNTER — Encounter (HOSPITAL_COMMUNITY): Payer: Medicare Other | Attending: Oncology

## 2012-07-28 DIAGNOSIS — N400 Enlarged prostate without lower urinary tract symptoms: Secondary | ICD-10-CM | POA: Insufficient documentation

## 2012-07-28 DIAGNOSIS — Z85038 Personal history of other malignant neoplasm of large intestine: Secondary | ICD-10-CM | POA: Insufficient documentation

## 2012-07-28 DIAGNOSIS — Z09 Encounter for follow-up examination after completed treatment for conditions other than malignant neoplasm: Secondary | ICD-10-CM | POA: Insufficient documentation

## 2012-07-28 DIAGNOSIS — D649 Anemia, unspecified: Secondary | ICD-10-CM | POA: Insufficient documentation

## 2012-07-28 DIAGNOSIS — C180 Malignant neoplasm of cecum: Secondary | ICD-10-CM | POA: Diagnosis not present

## 2012-07-28 LAB — CEA: CEA: 1.8 ng/mL (ref 0.0–5.0)

## 2012-07-28 LAB — PSA: PSA: 6.07 ng/mL — ABNORMAL HIGH (ref <=4.00)

## 2012-07-28 NOTE — Progress Notes (Signed)
Labs drawn today for cea,psa

## 2012-07-29 ENCOUNTER — Other Ambulatory Visit (INDEPENDENT_AMBULATORY_CARE_PROVIDER_SITE_OTHER): Payer: Self-pay | Admitting: *Deleted

## 2012-07-29 ENCOUNTER — Telehealth (INDEPENDENT_AMBULATORY_CARE_PROVIDER_SITE_OTHER): Payer: Self-pay | Admitting: *Deleted

## 2012-07-29 MED ORDER — PEG-KCL-NACL-NASULF-NA ASC-C 100 G PO SOLR: 1.0000 | Freq: Once | ORAL | Status: DC

## 2012-07-29 NOTE — Telephone Encounter (Signed)
Patient needs movi prep 

## 2012-07-29 NOTE — Patient Instructions (Addendum)
Your procedure is scheduled on:  08/05/12  Report to [facility] at 11:30 AM.  Call this number if you have problems the morning of surgery: [phone]   Remember:   Do not eat or drink:After Midnight.  Take these medicines the morning of surgery with A SIP OF WATER: None   Do not wear jewelry, make-up or nail polish.  Do not wear lotions, powders, or perfumes. You may wear deodorant.  Do not shave 48 hours prior to surgery. Men may shave face and neck.  Do not bring valuables to the hospital.  Contacts, dentures or bridgework may not be worn into surgery.  Leave suitcase in the car. After surgery it may be brought to your room.  For patients admitted to the hospital, checkout time is 11:00 AM the day of discharge.   Patients discharged the day of surgery will not be allowed to drive home.    Special Instructions: Start using your eye drops before surgery as directed by your eye doctor.   Please read over the following fact sheets that you were given: Anesthesia Post-op Instructions    Cataract Surgery  A cataract is a clouding of the lens of the eye. When a lens becomes cloudy, vision is reduced based on the degree and nature of the clouding. Surgery may be needed to improve vision. Surgery removes the cloudy lens and usually replaces it with a substitute lens (intraocular lens, IOL). LET YOUR EYE DOCTOR KNOW ABOUT:  Allergies to food or medicine.  Medicines taken including herbs, eyedrops, over-the-counter medicines, and creams.  Use of steroids (by mouth or creams).  Previous problems with anesthetics or numbing medicine.  History of bleeding problems or blood clots.  Previous surgery.  Other health problems, including diabetes and kidney problems.  Possibility of pregnancy, if this applies. RISKS AND COMPLICATIONS  Infection.  Inflammation of the eyeball (endophthalmitis) that can spread to both eyes (sympathetic ophthalmia).  Poor wound healing.  If an IOL is  inserted, it can later fall out of proper position. This is very uncommon.  Clouding of the part of your eye that holds an IOL in place. This is called an "after-cataract." These are uncommon, but easily treated. BEFORE THE PROCEDURE  Do not eat or drink anything except small amounts of water for 8 to 12 before your surgery, or as directed by your caregiver.  Unless you are told otherwise, continue any eyedrops you have been prescribed.  Talk to your primary caregiver about all other medicines that you take (both prescription and non-prescription). In some cases, you may need to stop or change medicines near the time of your surgery. This is most important if you are taking blood-thinning medicine.Do not stop medicines unless you are told to do so.  Arrange for someone to drive you to and from the procedure.  Do not put contact lenses in either eye on the day of your surgery. PROCEDURE There is more than one method for safely removing a cataract. Your doctor can explain the differences and help determine which is best for you. Phacoemulsification surgery is the most common form of cataract surgery.  An injection is given behind the eye or eyedrops are given to make this a painless procedure.  A small cut (incision) is made on the edge of the clear, dome-shaped surface that covers the front of the eye (cornea).  A tiny probe is painlessly inserted into the eye. This device gives off ultrasound waves that soften and break up the cloudy  center of the lens. This makes it easier for the cloudy lens to be removed by suction.  An IOL may be implanted.  The normal lens of the eye is covered by a clear capsule. Part of that capsule is intentionally left in the eye to support the IOL.  Your surgeon may or may not use stitches to close the incision. There are other forms of cataract surgery that require a larger incision and stiches to close the eye. This approach is taken in cases where the  doctor feels that the cataract cannot be easily removed using phacoemulsification. AFTER THE PROCEDURE  When an IOL is implanted, it does not need care. It becomes a permanent part of your eye and cannot be seen or felt.  Your doctor will schedule follow-up exams to check on your progress.  Review your other medicines with your doctor to see which can be resumed after surgery.  Use eyedrops or take medicine as prescribed by your doctor. Document Released: 05/08/2011 Document Revised: 08/11/2011 Document Reviewed: 05/08/2011 [facility] Patient Information 2013 [place], [place].    PATIENT INSTRUCTIONS POST-ANESTHESIA  IMMEDIATELY FOLLOWING SURGERY:  Do not drive or operate machinery for the first twenty four hours after surgery.  Do not make any important decisions for twenty four hours after surgery or while taking narcotic pain medications or sedatives.  If you develop intractable nausea and vomiting or a severe headache please notify your doctor immediately.  FOLLOW-UP:  Please make an appointment with your surgeon as instructed. You do not need to follow up with anesthesia unless specifically instructed to do so.  WOUND CARE INSTRUCTIONS (if applicable):  Keep a dry clean dressing on the anesthesia/puncture wound site if there is drainage.  Once the wound has quit draining you may leave it open to air.  Generally you should leave the bandage intact for twenty four hours unless there is drainage.  If the epidural site drains for more than 36-48 hours please call the anesthesia department.  QUESTIONS?:  Please feel free to call your physician or the hospital operator if you have any questions, and they will be happy to assist you.

## 2012-07-30 ENCOUNTER — Encounter (HOSPITAL_COMMUNITY)
Admission: RE | Admit: 2012-07-30 | Discharge: 2012-07-30 | Disposition: A | Discharge status: Home / Self Care | Payer: Medicare Other | Source: Ambulatory Visit | Attending: Ophthalmology | Admitting: Ophthalmology

## 2012-07-30 ENCOUNTER — Encounter (HOSPITAL_COMMUNITY): Payer: Self-pay | Admitting: Oncology

## 2012-07-30 ENCOUNTER — Encounter (HOSPITAL_COMMUNITY): Payer: Self-pay

## 2012-07-30 ENCOUNTER — Encounter (HOSPITAL_BASED_OUTPATIENT_CLINIC_OR_DEPARTMENT_OTHER): Payer: Medicare Other | Admitting: Oncology

## 2012-07-30 VITALS — BP 140/63 | HR 66 | Temp 98.0°F | Resp 18 | Wt 245.4 lb

## 2012-07-30 DIAGNOSIS — Z01810 Encounter for preprocedural cardiovascular examination: Secondary | ICD-10-CM | POA: Insufficient documentation

## 2012-07-30 DIAGNOSIS — Z01812 Encounter for preprocedural laboratory examination: Secondary | ICD-10-CM | POA: Insufficient documentation

## 2012-07-30 DIAGNOSIS — Z5309 Procedure and treatment not carried out because of other contraindication: Secondary | ICD-10-CM | POA: Insufficient documentation

## 2012-07-30 DIAGNOSIS — H2510 Age-related nuclear cataract, unspecified eye: Secondary | ICD-10-CM | POA: Diagnosis not present

## 2012-07-30 HISTORY — DX: Malignant (primary) neoplasm, unspecified: C80.1

## 2012-07-30 LAB — BASIC METABOLIC PANEL
Calcium: 9.3 mg/dL (ref 8.4–10.5)
GFR calc Af Amer: >90 mL/min (ref >90)
GFR calc non Af Amer: 85 mL/min — ABNORMAL LOW (ref >90)
Potassium: 4.2 mEq/L (ref 3.5–5.1)
Sodium: 139 mEq/L (ref 135–145)

## 2012-07-30 LAB — HEMOGLOBIN AND HEMATOCRIT, BLOOD
HCT: 44.6 % (ref 39.0–52.0)
Hemoglobin: 15.2 g/dL (ref 13.0–17.0)

## 2012-07-30 NOTE — Progress Notes (Signed)
#  1 stage II A. (T3, N0) adenocarcinoma of the cecum, grade 2, a 0.5 cm, with pericolonic connective tissue involvement but all margins were negative. No LV I was seen. 28 lymph nodes were negative. Surgery Took place 03/05/2012. #2 BPH with a very large prostate with trouble urinating right after his colon cancer surgery status post TURP at that time he does have a minimally elevated PSA which fluctuates. He states the highest is just above 8 and the other day his PSA was 6. He is status post prostate biopsy in 2006. At the time of his TURP all submitted specimens were benign. He does not have any symptoms presently a bladder outlet obstruction. #3 anemia, microcytic, at presentation was colon cancer status post resolution #4 bladder calculus in the past consisting of calcium oxalate #5 fluid collection in the right lower abdominal wall musculature felt to be an abscess and a repeat CAT scan will be done in April to make sure all changes have resolved and that there is no evidence of metastatic disease and we will also compare his prostate size, etc. but I suspect this will be his last CAT scan unless her symptoms or signs of recurrent colon cancer or his CEA rises.  Very pleasant gentleman who is accompanied by his significant other. He is back to work full-time. 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 He has no symptoms referable to his colon cancer or 2 prostate changes. He is not sure he wants to have a prostate biopsy and he had numerous questions about that. We talked about this extensively.  If he has something palpable or he has any symptoms he knows that a biopsy is not unreasonable but that treating just the PSA is not my favorite thing to do.  Oncology review of systems otherwise is negative and he is due for colonoscopy in April.  BP 140/63  Pulse 66  Temp(Src) 98 F (36.7 C) (Oral)  Resp 18  Wt 245 lb 6.4 oz (111.313 kg)  BMI 30.67 kg/m2  He is in no acute distress. Lymph  nodes are negative throughout. Lungs are clear. Heart shows a regular rhythm and rate without murmur rub or gallop. Abdomen is soft and nontender without hepatosplenomegaly or masses. Bowel sounds are normal. He has no abdominal distention. He has no leg edema no arm edema.  We will see him with CEAs every 3 months for total 5 years. We will do this CAT scan and then stopped using them unless you symptoms or signs. We will see him back in 6 months sooner if need be.

## 2012-07-30 NOTE — Patient Instructions (Addendum)
[facility] Discharge Instructions  RECOMMENDATIONS MADE BY THE CONSULTANT AND ANY TEST RESULTS WILL BE SENT TO YOUR REFERRING PHYSICIAN.  EXAM FINDINGS BY THE PHYSICIAN TODAY AND SIGNS OR SYMPTOMS TO REPORT TO CLINIC OR PRIMARY PHYSICIAN: Exam and discussion by MD.  Your CEA has been stable.  Will do one more CT of your abdomen and pelvis and if they're ok will not need to do any more scans.  MEDICATIONS PRESCRIBED:  none  INSTRUCTIONS GIVEN AND DISCUSSED: Report changes in bowel habits, blood in your stool, etc.  SPECIAL INSTRUCTIONS/FOLLOW-UP: CEA every 3 months, CT of abdomen and pelvis in April and to be seen in follow-up in  6 months.  Thank you for choosing [facility] to provide your oncology and hematology care.  To afford each patient quality time with our providers, please arrive at least 15 minutes before your scheduled appointment time.  With your help, our goal is to use those 15 minutes to complete the necessary work-up to ensure our physicians have the information they need to help with your evaluation and healthcare recommendations.    Effective January 1st, 2014, we ask that you re-schedule your appointment with our physicians should you arrive 10 or more minutes late for your appointment.  We strive to give you quality time with our providers, and arriving late affects you and other patients whose appointments are after yours.    Again, thank you for choosing [facility].  Our hope is that these requests will decrease the amount of time that you wait before being seen by our physicians.       _____________________________________________________________  Should you have questions after your visit to [facility], please contact our office at [phone] between the hours of 8:30 a.m. and 5:00 p.m.  Voicemails left after 4:30 p.m. will not be returned until the following business day.  For prescription refill  requests, have your pharmacy contact our office with your prescription refill request.

## 2012-08-02 DIAGNOSIS — H2589 Other age-related cataract: Secondary | ICD-10-CM | POA: Diagnosis not present

## 2012-08-04 MED ORDER — CYCLOPENTOLATE-PHENYLEPHRINE 0.2-1 % OP SOLN
OPHTHALMIC | Status: AC
  Filled 2012-08-04: qty 2

## 2012-08-04 MED ORDER — TETRACAINE HCL 0.5 % OP SOLN
OPHTHALMIC | Status: AC
  Filled 2012-08-04: qty 2

## 2012-08-04 MED ORDER — LIDOCAINE HCL (PF) 1 % IJ SOLN
INTRAMUSCULAR | Status: AC
  Filled 2012-08-04: qty 2

## 2012-08-04 MED ORDER — PHENYLEPHRINE HCL 2.5 % OP SOLN
OPHTHALMIC | Status: AC
  Filled 2012-08-04: qty 2

## 2012-08-04 MED ORDER — NEOMYCIN-POLYMYXIN-DEXAMETH 3.5-10000-0.1 OP OINT
TOPICAL_OINTMENT | OPHTHALMIC | Status: AC
  Filled 2012-08-04: qty 3.5

## 2012-08-04 MED ORDER — LIDOCAINE HCL 3.5 % OP GEL
OPHTHALMIC | Status: AC
  Filled 2012-08-04: qty 5

## 2012-08-05 MED ORDER — EPINEPHRINE HCL 1 MG/ML IJ SOLN
INTRAMUSCULAR | Status: AC
  Filled 2012-08-05: qty 1

## 2012-08-05 NOTE — OR Nursing (Signed)
Pt. Was on for surgery 08/05/2012, moved to Monday 08/09/2012.  Pt. Aware of time change.

## 2012-08-09 ENCOUNTER — Ambulatory Visit (HOSPITAL_COMMUNITY): Admission: RE | Admit: 2012-08-09 | Payer: Medicare Other | Source: Ambulatory Visit | Admitting: Ophthalmology

## 2012-08-09 ENCOUNTER — Encounter (HOSPITAL_COMMUNITY): Admission: RE | Payer: Self-pay | Source: Ambulatory Visit

## 2012-08-09 SURGERY — PHACOEMULSIFICATION, CATARACT, WITH IOL INSERTION
Anesthesia: Monitor Anesthesia Care | Site: Eye | Laterality: Left

## 2012-08-09 NOTE — OR Nursing (Signed)
Case was cancelled by office, message was left on recorder that he has shingles an he will reschedule.

## 2012-08-10 ENCOUNTER — Encounter (HOSPITAL_COMMUNITY): Payer: Self-pay | Admitting: Pharmacy Technician

## 2012-08-17 ENCOUNTER — Encounter (HOSPITAL_COMMUNITY)
Admission: RE | Admit: 2012-08-17 | Discharge: 2012-08-17 | Disposition: A | Discharge status: Home / Self Care | Payer: Medicare Other | Source: Ambulatory Visit | Attending: Ophthalmology | Admitting: Ophthalmology

## 2012-08-17 ENCOUNTER — Encounter (HOSPITAL_COMMUNITY): Payer: Self-pay

## 2012-08-17 MED ORDER — TETRACAINE HCL 0.5 % OP SOLN: 1.0000 [drp] | OPHTHALMIC | Status: DC

## 2012-08-17 MED ORDER — CYCLOPENTOLATE-PHENYLEPHRINE 0.2-1 % OP SOLN: 1.0000 [drp] | OPHTHALMIC | Status: DC

## 2012-08-17 MED ORDER — PHENYLEPHRINE HCL 2.5 % OP SOLN: 1.0000 [drp] | OPHTHALMIC | Status: DC

## 2012-08-17 MED ORDER — LIDOCAINE HCL 3.5 % OP GEL: 1.0000 "application " | Freq: Once | OPHTHALMIC | Status: DC

## 2012-08-19 ENCOUNTER — Telehealth (INDEPENDENT_AMBULATORY_CARE_PROVIDER_SITE_OTHER): Payer: Self-pay | Admitting: *Deleted

## 2012-08-19 NOTE — Telephone Encounter (Signed)
agree

## 2012-08-19 NOTE — Telephone Encounter (Signed)
  Procedure: tcs  Reason/Indication:  hx colon ca  Has patient had this procedure before?  Yes, 06/2011  If so, when, by whom and where?    Is there a family history of colon cancer?  no  Who?  What age when diagnosed?    Is patient diabetic?   no      Does patient have prosthetic heart valve?  no  Do you have a pacemaker?  no  Has patient ever had endocarditis?   Has patient had joint replacement within last 12 months?  no  Is patient on Coumadin, Plavix and/or Aspirin? no  Medications: omega men's 50 + daily, triple strength fish oil daily  Allergies: nkda  Medication Adjustment:   Procedure date & time: 09/16/12 at 930

## 2012-08-20 MED ORDER — LIDOCAINE HCL 3.5 % OP GEL
OPHTHALMIC | Status: AC
  Filled 2012-08-20: qty 5

## 2012-08-20 MED ORDER — CYCLOPENTOLATE-PHENYLEPHRINE 0.2-1 % OP SOLN
OPHTHALMIC | Status: AC
  Filled 2012-08-20: qty 2

## 2012-08-20 MED ORDER — NEOMYCIN-POLYMYXIN-DEXAMETH 3.5-10000-0.1 OP OINT
TOPICAL_OINTMENT | OPHTHALMIC | Status: AC
  Filled 2012-08-20: qty 3.5

## 2012-08-20 MED ORDER — PHENYLEPHRINE HCL 2.5 % OP SOLN
OPHTHALMIC | Status: AC
  Filled 2012-08-20: qty 2

## 2012-08-20 MED ORDER — LIDOCAINE HCL (PF) 1 % IJ SOLN
INTRAMUSCULAR | Status: AC
  Filled 2012-08-20: qty 2

## 2012-08-20 MED ORDER — TETRACAINE HCL 0.5 % OP SOLN
OPHTHALMIC | Status: AC
  Filled 2012-08-20: qty 2

## 2012-08-23 ENCOUNTER — Encounter (HOSPITAL_COMMUNITY): Payer: Self-pay | Admitting: Anesthesiology

## 2012-08-23 ENCOUNTER — Ambulatory Visit (HOSPITAL_COMMUNITY): Payer: Medicare Other | Admitting: Anesthesiology

## 2012-08-23 ENCOUNTER — Ambulatory Visit (HOSPITAL_COMMUNITY)
Admission: RE | Admit: 2012-08-23 | Discharge: 2012-08-23 | Disposition: A | Discharge status: Home / Self Care | Payer: Medicare Other | Source: Ambulatory Visit | Attending: Ophthalmology | Admitting: Ophthalmology

## 2012-08-23 ENCOUNTER — Encounter (HOSPITAL_COMMUNITY)
Admission: RE | Disposition: A | Discharge status: Home / Self Care | Payer: Self-pay | Source: Ambulatory Visit | Attending: Ophthalmology

## 2012-08-23 DIAGNOSIS — K219 Gastro-esophageal reflux disease without esophagitis: Secondary | ICD-10-CM | POA: Insufficient documentation

## 2012-08-23 DIAGNOSIS — H268 Other specified cataract: Secondary | ICD-10-CM | POA: Insufficient documentation

## 2012-08-23 DIAGNOSIS — H2589 Other age-related cataract: Secondary | ICD-10-CM | POA: Diagnosis not present

## 2012-08-23 DIAGNOSIS — H269 Unspecified cataract: Secondary | ICD-10-CM | POA: Diagnosis not present

## 2012-08-23 DIAGNOSIS — D649 Anemia, unspecified: Secondary | ICD-10-CM | POA: Insufficient documentation

## 2012-08-23 DIAGNOSIS — C189 Malignant neoplasm of colon, unspecified: Secondary | ICD-10-CM | POA: Insufficient documentation

## 2012-08-23 HISTORY — PX: CATARACT EXTRACTION W/PHACO: SHX586

## 2012-08-23 SURGERY — PHACOEMULSIFICATION, CATARACT, WITH IOL INSERTION
Anesthesia: Monitor Anesthesia Care | Site: Eye | Laterality: Left | Wound class: Clean

## 2012-08-23 MED ORDER — NEOMYCIN-POLYMYXIN-DEXAMETH 0.1 % OP OINT
TOPICAL_OINTMENT | OPHTHALMIC | Status: DC | PRN
  Administered 2012-08-23: 1 via OPHTHALMIC

## 2012-08-23 MED ORDER — MIDAZOLAM HCL 2 MG/2ML IJ SOLN
INTRAMUSCULAR | Status: AC
  Filled 2012-08-23: qty 2

## 2012-08-23 MED ORDER — LIDOCAINE HCL (PF) 1 % IJ SOLN
INTRAMUSCULAR | Status: DC | PRN
  Administered 2012-08-23: .6 mL

## 2012-08-23 MED ORDER — CYCLOPENTOLATE-PHENYLEPHRINE 0.2-1 % OP SOLN
1.0000 [drp] | OPHTHALMIC | Status: AC
  Administered 2012-08-23 (×3): 1 [drp] via OPHTHALMIC

## 2012-08-23 MED ORDER — PROVISC 10 MG/ML IO SOLN
INTRAOCULAR | Status: DC | PRN
  Administered 2012-08-23: 8.5 mg via INTRAOCULAR

## 2012-08-23 MED ORDER — ONDANSETRON HCL 4 MG/2ML IJ SOLN: 4.0000 mg | Freq: Once | INTRAMUSCULAR | Status: DC | PRN

## 2012-08-23 MED ORDER — MIDAZOLAM HCL 2 MG/2ML IJ SOLN
1.0000 mg | INTRAMUSCULAR | Status: DC | PRN
  Administered 2012-08-23: 2 mg via INTRAVENOUS

## 2012-08-23 MED ORDER — BSS IO SOLN
INTRAOCULAR | Status: DC | PRN
  Administered 2012-08-23: 15 mL via INTRAOCULAR

## 2012-08-23 MED ORDER — EPINEPHRINE HCL 1 MG/ML IJ SOLN
INTRAOCULAR | Status: DC | PRN
  Administered 2012-08-23: 08:00:00

## 2012-08-23 MED ORDER — FENTANYL CITRATE 0.05 MG/ML IJ SOLN: 25.0000 ug | INTRAMUSCULAR | Status: DC | PRN

## 2012-08-23 MED ORDER — TETRACAINE HCL 0.5 % OP SOLN
1.0000 [drp] | OPHTHALMIC | Status: AC
  Administered 2012-08-23 (×3): 1 [drp] via OPHTHALMIC

## 2012-08-23 MED ORDER — PHENYLEPHRINE HCL 2.5 % OP SOLN
1.0000 [drp] | OPHTHALMIC | Status: AC
  Administered 2012-08-23 (×3): 1 [drp] via OPHTHALMIC

## 2012-08-23 MED ORDER — EPINEPHRINE HCL 1 MG/ML IJ SOLN
INTRAMUSCULAR | Status: AC
  Filled 2012-08-23: qty 1

## 2012-08-23 MED ORDER — LACTATED RINGERS IV SOLN
INTRAVENOUS | Status: DC
  Administered 2012-08-23: 08:00:00 via INTRAVENOUS

## 2012-08-23 MED ORDER — LIDOCAINE HCL 3.5 % OP GEL
1.0000 "application " | Freq: Once | OPHTHALMIC | Status: AC
  Administered 2012-08-23: 1 via OPHTHALMIC

## 2012-08-23 MED ORDER — POVIDONE-IODINE 5 % OP SOLN
OPHTHALMIC | Status: DC | PRN
  Administered 2012-08-23: 1 via OPHTHALMIC

## 2012-08-23 SURGICAL SUPPLY — 32 items

## 2012-08-23 NOTE — H&P (Signed)
I have reviewed the H&P, the patient was re-examined, and I have identified no interval changes in medical condition and plan of care since the history and physical of record  

## 2012-08-23 NOTE — Anesthesia Preprocedure Evaluation (Signed)
Anesthesia Evaluation  Patient identified by MRN, date of birth, ID band Patient awake    Reviewed: Allergy & Precautions, H&P , NPO status , Patient's Chart, lab work & pertinent test results  History of Anesthesia Complications (+) DIFFICULT AIRWAY  Airway Mallampati: II TM Distance: >3 FB Neck ROM: Full   Comment: Glidescope with #4, easily visualized cords.  Unable to visualize with Mac3 or Mac4. Dental  (+) Partial Upper   Pulmonary neg pulmonary ROS,    Pulmonary exam normal       Cardiovascular negative cardio ROS  Rhythm:Regular Rate:Normal     Neuro/Psych negative neurological ROS  negative psych ROS   GI/Hepatic negative GI ROS, Neg liver ROS,   Endo/Other  negative endocrine ROS  Renal/GU Renal disease (kidney stones)     Musculoskeletal negative musculoskeletal ROS (+)   Abdominal Normal abdominal exam  (+)   Peds  Hematology  (+) Blood dyscrasia, anemia ,   Anesthesia Other Findings   Reproductive/Obstetrics                           Anesthesia Physical Anesthesia Plan  ASA: III  Anesthesia Plan: MAC   Post-op Pain Management:    Induction:   Airway Management Planned: Nasal Cannula  Additional Equipment:   Intra-op Plan:   Post-operative Plan:   Informed Consent: I have reviewed the patients History and Physical, chart, labs and discussed the procedure including the risks, benefits and alternatives for the proposed anesthesia with the patient or authorized representative who has indicated his/her understanding and acceptance.     Plan Discussed with: CRNA  Anesthesia Plan Comments:         Anesthesia Quick Evaluation

## 2012-08-23 NOTE — Transfer of Care (Signed)
Immediate Anesthesia Transfer of Care Note  Patient: William Burns  Procedure(s) Performed: Procedure(s) with comments: CATARACT EXTRACTION PHACO AND INTRAOCULAR LENS PLACEMENT (IOC) (Left) - CDE 16.64  Patient Location: PACU and Short Stay  Anesthesia Type:MAC  Level of Consciousness: awake, alert  and oriented  Airway & Oxygen Therapy: Patient Spontanous Breathing  Post-op Assessment: Report given to PACU RN  Post vital signs: Reviewed  Complications: No apparent anesthesia complications

## 2012-08-23 NOTE — Anesthesia Postprocedure Evaluation (Signed)
  Anesthesia Post-op Note  Patient: William Burns  Procedure(s) Performed: Procedure(s) with comments: CATARACT EXTRACTION PHACO AND INTRAOCULAR LENS PLACEMENT (IOC) (Left) - CDE 16.64  Patient Location: PACU and Short Stay  Anesthesia Type:MAC  Level of Consciousness: awake, alert  and oriented  Airway and Oxygen Therapy: Patient Spontanous Breathing  Post-op Pain: none  Post-op Assessment: Post-op Vital signs reviewed, Patient's Cardiovascular Status Stable, Respiratory Function Stable, Patent Airway and No signs of Nausea or vomiting  Post-op Vital Signs: Reviewed and stable  Complications: No apparent anesthesia complications

## 2012-08-23 NOTE — Brief Op Note (Signed)
Pre-Op Dx: Cataract OS Post-Op Dx: Cataract OS Surgeon: Imagine Nest Anesthesia: Topical with MAC Surgery: Cataract Extraction with Intraocular lens Implant OS Implant: B&L enVista Specimen: None Complications: None 

## 2012-08-23 NOTE — Op Note (Signed)
William Burns, William Burns               ACCOUNT NO.:  [id number]  MEDICAL RECORD NO.:  [id number]  LOCATION:  [facility]                          FACILITY:  [facility]  PHYSICIAN:  Susanne Greenhouse, MD       DATE OF BIRTH:  July 09, 1945  DATE OF PROCEDURE:  08/23/2012 DATE OF DISCHARGE:  08/23/2012                              OPERATIVE REPORT   PREOPERATIVE DIAGNOSIS:  Combined cataract, left eye, diagnosis code 366.19.  POSTOPERATIVE DIAGNOSIS:  Combined cataract, left eye, diagnosis code 366.19.  PROCEDURE PERFORMED:  Phacoemulsification, posterior chamber intraocular lens implantation, left eye.  SURGEON:  Bonne Dolores. Evelyna Folker, MD  ANESTHESIA:  Topical with monitored anesthesia care and IV sedation.  OPERATIVE SUMMARY:  In the preoperative area, dilating drops were placed into the left eye.  The patient was then brought into the operating room where he was placed under general anesthesia.  The eye was then prepped and draped.  Beginning with a 75 blade, a paracentesis port was made at the surgeon's 2 o'clock position.  The anterior chamber was then filled with a 1% nonpreserved lidocaine solution with epinephrine.  This was followed by Viscoat to deepen the chamber.  A small fornix-based peritomy was performed superiorly.  Next, a single iris hook was placed through the limbus superiorly.  A 2.4-mm keratome blade was then used to make a clear corneal incision over the iris hook.  A bent cystotome needle and Utrata forceps were used to create a continuous tear capsulotomy.  Hydrodissection was performed using balanced salt solution on a fine cannula.  The lens nucleus was then removed using phacoemulsification in a quadrant cracking technique.  The cortical material was then removed with irrigation and aspiration.  The capsular bag and anterior chamber were refilled with Provisc.  The wound was widened to approximately 3 mm and a posterior chamber intraocular lens was placed into the capsular bag  without difficulty using an [employer] lens injecting system.  A single 10-0 nylon suture was then used to close the incision as well as stromal hydration.  The Provisc was removed from the anterior chamber and capsular bag with irrigation and aspiration.  At this point, the wounds were tested for leak, which were negative.  The anterior chamber remained deep and stable.  The patient tolerated the procedure well.  There were no operative complications, and he awoke from general anesthesia without problem.  No surgical specimens.  Prosthetic device used is a Bausch and Lomb posterior chamber lens, model #MX60, power of 20.5, serial number is [id number].          ______________________________ Susanne Greenhouse, MD     KEH/MEDQ  D:  08/23/2012  T:  08/23/2012  Job:  [id number]

## 2012-08-24 ENCOUNTER — Encounter (HOSPITAL_COMMUNITY): Payer: Self-pay | Admitting: Ophthalmology

## 2012-08-28 ENCOUNTER — Other Ambulatory Visit: Payer: Self-pay

## 2012-08-28 ENCOUNTER — Emergency Department (HOSPITAL_COMMUNITY): Payer: Medicare Other

## 2012-08-28 ENCOUNTER — Emergency Department (HOSPITAL_COMMUNITY)
Admission: EM | Admit: 2012-08-28 | Discharge: 2012-08-28 | Disposition: A | Discharge status: Home / Self Care | Payer: Medicare Other | Attending: Emergency Medicine | Admitting: Emergency Medicine

## 2012-08-28 ENCOUNTER — Encounter (HOSPITAL_COMMUNITY): Payer: Self-pay | Admitting: *Deleted

## 2012-08-28 DIAGNOSIS — Z789 Other specified health status: Secondary | ICD-10-CM | POA: Insufficient documentation

## 2012-08-28 DIAGNOSIS — Z87442 Personal history of urinary calculi: Secondary | ICD-10-CM | POA: Insufficient documentation

## 2012-08-28 DIAGNOSIS — J019 Acute sinusitis, unspecified: Secondary | ICD-10-CM | POA: Diagnosis not present

## 2012-08-28 DIAGNOSIS — Z87891 Personal history of nicotine dependence: Secondary | ICD-10-CM | POA: Insufficient documentation

## 2012-08-28 DIAGNOSIS — Z85038 Personal history of other malignant neoplasm of large intestine: Secondary | ICD-10-CM | POA: Diagnosis not present

## 2012-08-28 DIAGNOSIS — J329 Chronic sinusitis, unspecified: Secondary | ICD-10-CM | POA: Insufficient documentation

## 2012-08-28 DIAGNOSIS — Z79899 Other long term (current) drug therapy: Secondary | ICD-10-CM | POA: Insufficient documentation

## 2012-08-28 DIAGNOSIS — R51 Headache: Secondary | ICD-10-CM | POA: Diagnosis not present

## 2012-08-28 DIAGNOSIS — R42 Dizziness and giddiness: Secondary | ICD-10-CM | POA: Insufficient documentation

## 2012-08-28 DIAGNOSIS — R110 Nausea: Secondary | ICD-10-CM | POA: Diagnosis not present

## 2012-08-28 DIAGNOSIS — Z87448 Personal history of other diseases of urinary system: Secondary | ICD-10-CM | POA: Diagnosis not present

## 2012-08-28 DIAGNOSIS — Z8719 Personal history of other diseases of the digestive system: Secondary | ICD-10-CM | POA: Insufficient documentation

## 2012-08-28 DIAGNOSIS — R5383 Other fatigue: Secondary | ICD-10-CM | POA: Diagnosis not present

## 2012-08-28 LAB — COMPREHENSIVE METABOLIC PANEL
Albumin: 3.7 g/dL (ref 3.5–5.2)
BUN: 10 mg/dL (ref 6–23)
Calcium: 9 mg/dL (ref 8.4–10.5)
Creatinine, Ser: 0.8 mg/dL (ref 0.50–1.35)
Glucose, Bld: 97 mg/dL (ref 70–99)
Total Protein: 7 g/dL (ref 6.0–8.3)

## 2012-08-28 LAB — URINALYSIS, ROUTINE W REFLEX MICROSCOPIC
Glucose, UA: NEGATIVE mg/dL
Hgb urine dipstick: NEGATIVE
Protein, ur: NEGATIVE mg/dL
Specific Gravity, Urine: 1.01 (ref 1.005–1.030)
pH: 6.5 (ref 5.0–8.0)

## 2012-08-28 LAB — CBC
HCT: 43.9 % (ref 39.0–52.0)
Hemoglobin: 15 g/dL (ref 13.0–17.0)
MCH: 29.6 pg (ref 26.0–34.0)
MCHC: 34.2 g/dL (ref 30.0–36.0)
RDW: 13.3 % (ref 11.5–15.5)

## 2012-08-28 MED ORDER — AMOXICILLIN-POT CLAVULANATE 875-125 MG PO TABS
1.0000 | ORAL_TABLET | Freq: Once | ORAL | Status: AC
  Administered 2012-08-28: 1 via ORAL
  Filled 2012-08-28: qty 1

## 2012-08-28 MED ORDER — CETIRIZINE-PSEUDOEPHEDRINE ER 5-120 MG PO TB12: 1.0000 | ORAL_TABLET | Freq: Two times a day (BID) | ORAL | Status: DC | PRN

## 2012-08-28 MED ORDER — AMOXICILLIN-POT CLAVULANATE 875-125 MG PO TABS: 1.0000 | ORAL_TABLET | Freq: Two times a day (BID) | ORAL | Status: DC

## 2012-08-28 NOTE — ED Notes (Addendum)
Pt states that he woke up this am from sleep with c/o dizziness, headache, nausea, around 4:30 this am, has had several episodes of "sweats" since this am  family members reports that pt has been "off balance" today, denies any problems with speech, denies any weakness.

## 2012-08-28 NOTE — ED Notes (Addendum)
Patient advised of need for urine specimen.   Report received from offgoing RN Enzo Bi.

## 2012-08-28 NOTE — ED Provider Notes (Addendum)
History     CSN: [id number]  Arrival date & time 08/28/12  1513   First MD Initiated Contact with Patient 08/28/12 1633      Chief Complaint  Patient presents with  . Dizziness  . Nausea    (Consider location/radiation/quality/duration/timing/severity/associated sxs/prior treatment) The history is provided by the patient and the spouse.  pt with hx colon ca, states when got up to go to bathroom this morning felt dizzy, room spinning sensation. States awoke, felt normal. When went to walk to bathroom began dizzy.  Mild nausea. Then felt sweaty. No associated cp or discomfort. States no pain of any sort associated w onset dizziness. No sob. States dizziness persisted for the next couple hours. Has been ambulatory, no problems w gait/walking. No change in speech or vision. No hearing loss or tinnitus. No hx vertigo. Denies any palpitations or irregular heart beat. No lightheadedness or syncope. States symptoms now much improved. This afternoon, gradual onset dull, frontal headache, behind bridge of nose. Mild-mod. Dull. ?sinus pressure sensation. No nasal drainage, no fever.  No eye pain or change in vision. No association w dizziness. No neck pain or stiffness.   Past Medical History  Diagnosis Date  . BPH (benign prostatic hyperplasia)   . Kidney stones 2011  . Tooth infection 07/03/2011  . Colonic mass 07/03/2011  . Erosive gastritis 07/03/2011  . Renal calculus   . Difficult intubation     wife states they were told he had to have an "extra long tube" for intubation, had trouble with this when he had colon resection 07-04-11 Dr Shanda Howells  . Cancer     colon  . Cataract     Past Surgical History  Procedure Laterality Date  . Hernia repair    . Prostate biopsy    . Esophagogastroduodenoscopy  07/03/2011    Procedure: ESOPHAGOGASTRODUODENOSCOPY (EGD);  Surgeon: Malissa Hippo, MD;  Location: [facility];  Service: Endoscopy;  Laterality: N/A;  . Colonoscopy  07/03/2011    Procedure:  COLONOSCOPY;  Surgeon: Malissa Hippo, MD;  Location: [facility];  Service: Endoscopy;  Laterality: N/A;  . Partial colectomy  07/04/2011    Procedure: PARTIAL COLECTOMY;  Surgeon: Dalia Heading, MD;  Location: [facility];  Service: General;  Laterality: N/A;  . Cystoscopy  07/09/2011    Procedure: Derinda Late;  Surgeon: Ky Barban, MD;  Location: [facility];  Service: Urology;  Laterality: N/A;  . Colon surgery    . Transurethral resection of prostate  07/11/2011    Procedure: TRANSURETHRAL RESECTION OF THE PROSTATE (TURP);  Surgeon: Ky Barban, MD;  Location: [facility];  Service: Urology;  Laterality: N/A;  . Cystoscopy with litholapaxy  07/11/2011    Procedure: CYSTOSCOPY WITH LITHOLAPAXY;  Surgeon: Ky Barban, MD;  Location: [facility];  Service: Urology;  Laterality: N/A;  . Cataract extraction w/phaco Left 08/23/2012    Procedure: CATARACT EXTRACTION PHACO AND INTRAOCULAR LENS PLACEMENT (IOC);  Surgeon: Gemma Payor, MD;  Location: [facility];  Service: Ophthalmology;  Laterality: Left;  CDE 16.64    Family History  Problem Relation Age of Onset  . Alzheimer's disease Mother   . Alzheimer's disease Father   . Aneurysm Brother     brain  . Anuerysm Brother   . Colon cancer Neg Hx   . Healthy Son   . Healthy Son     History  Substance Use Topics  . Smoking status: Former Smoker -- 2.00 packs/day for 25 years  Types: Cigarettes    Quit date: 06/02/1989  . Smokeless tobacco: Never Used  . Alcohol Use: No      Review of Systems  Constitutional: Negative for fever and chills.  HENT: Negative for neck pain and neck stiffness.   Eyes: Negative for pain and visual disturbance.  Respiratory: Negative for shortness of breath.   Cardiovascular: Negative for chest pain.  Gastrointestinal: Negative for vomiting, abdominal pain and diarrhea.  Genitourinary: Negative for dysuria and flank pain.  Musculoskeletal: Negative for back pain.  Skin: Negative for rash.   Neurological: Negative for weakness and numbness.  Hematological: Does not bruise/bleed easily.  Psychiatric/Behavioral: Negative for confusion.    Allergies  Review of patient's allergies indicates no known allergies.  Home Medications   Current Outpatient Rx  Name  Route  Sig  Dispense  Refill  . Multiple Vitamin (MULITIVITAMIN WITH MINERALS) TABS   Oral   Take 1 tablet by mouth daily. MEGA MENS 50+ MULTIVITAMIN         . Omega-3 Fatty Acids (FISH OIL TRIPLE STRENGTH PO)   Oral   Take 1 capsule by mouth daily.         . peg 3350 powder (MOVIPREP) 100 G SOLR   Oral   Take 1 kit (100 g total) by mouth once.   1 kit   0     BP 144/65  Pulse 61  Temp(Src) 98.2 F (36.8 C) (Oral)  Resp 18  Ht 6\' 2"  (1.88 m)  Wt 247 lb (112.038 kg)  BMI 31.7 kg/m2  SpO2 98%  Physical Exam  Nursing note and vitals reviewed. Constitutional: He is oriented to person, place, and time. He appears well-developed and well-nourished. No distress.  HENT:  Head: Atraumatic.  Mouth/Throat: Oropharynx is clear and moist.  Cerumen in bil eac's, unable to visualize tms.   Eyes: Conjunctivae and EOM are normal. Pupils are equal, round, and reactive to light.  Neck: Normal range of motion. Neck supple. No tracheal deviation present. No thyromegaly present.  No bruit  Cardiovascular: Normal rate, regular rhythm, normal heart sounds and intact distal pulses.   Pulmonary/Chest: Effort normal and breath sounds normal. No accessory muscle usage. No respiratory distress.  Abdominal: Soft. Bowel sounds are normal. He exhibits no distension. There is no tenderness.  Musculoskeletal: Normal range of motion. He exhibits no edema and no tenderness.  Neurological: He is alert and oriented to person, place, and time. No cranial nerve deficit.  No nystagmus. Motor intact bil. No pronator drift. Finger to nose, heel to shin normal. Romberg neg/normal. Ambulates w steady gait.   Skin: Skin is warm and dry. He  is not diaphoretic.  Psychiatric: He has a normal mood and affect.    ED Course  Procedures (including critical care time)   Results for orders placed during the hospital encounter of 08/28/12  CBC      Result Value Range   WBC 7.8  4.0 - 10.5 K/uL   RBC 5.07  4.22 - 5.81 MIL/uL   Hemoglobin 15.0  13.0 - 17.0 g/dL   HCT 16.1  09.6 - 04.5 %   MCV 86.6  78.0 - 100.0 fL   MCH 29.6  26.0 - 34.0 pg   MCHC 34.2  30.0 - 36.0 g/dL   RDW 40.9  81.1 - 91.4 %   Platelets 242  150 - 400 K/uL  COMPREHENSIVE METABOLIC PANEL      Result Value Range   Sodium 136  135 -  145 mEq/L   Potassium 3.9  3.5 - 5.1 mEq/L   Chloride 101  96 - 112 mEq/L   CO2 28  19 - 32 mEq/L   Glucose, Bld 97  70 - 99 mg/dL   BUN 10  6 - 23 mg/dL   Creatinine, Ser 1.61  0.50 - 1.35 mg/dL   Calcium 9.0  8.4 - 09.6 mg/dL   Total Protein 7.0  6.0 - 8.3 g/dL   Albumin 3.7  3.5 - 5.2 g/dL   AST 32  0 - 37 U/L   ALT 22  0 - 53 U/L   Alkaline Phosphatase 77  39 - 117 U/L   Total Bilirubin 0.6  0.3 - 1.2 mg/dL  TROPONIN I      Result Value Range   Troponin I <0.30  <0.30 ng/mL  URINALYSIS, ROUTINE W REFLEX MICROSCOPIC      Result Value Range   Color, Urine YELLOW  YELLOW   APPearance CLEAR  CLEAR   Specific Gravity, Urine 1.010  1.005 - 1.030   pH 6.5  5.0 - 8.0   Glucose, UA NEGATIVE  NEGATIVE mg/dL   Hgb urine dipstick NEGATIVE  NEGATIVE   Bilirubin Urine NEGATIVE  NEGATIVE   Ketones, ur NEGATIVE  NEGATIVE mg/dL   Protein, ur NEGATIVE  NEGATIVE mg/dL   Urobilinogen, UA 0.2  0.0 - 1.0 mg/dL   Nitrite NEGATIVE  NEGATIVE   Leukocytes, UA TRACE (*) NEGATIVE  URINE MICROSCOPIC-ADD ON      Result Value Range   WBC, UA 3-6  <3 WBC/hpf   Bacteria, UA RARE  RARE   Ct Head Wo Contrast  08/28/2012  *RADIOLOGY REPORT*  Clinical Data: Dizziness and weakness.  CT HEAD WITHOUT CONTRAST  Technique:  Contiguous axial images were obtained from the base of the skull through the vertex without contrast.  Comparison: None.   Findings: No evidence for acute hemorrhage, mass lesion, midline shift, hydrocephalus or large infarct.  Normal appearance of the ventricles and basal cisterns.  There is mucosal disease in the ethmoid air cells.  There is fluid in the left sphenoid sinus.  No evidence for a fracture.  IMPRESSION: No acute intracranial abnormality.  Paranasal sinus disease.   Original Report Authenticated By: Richarda Overlie, M.D.       MDM  Labs. Ct.  Reviewed nursing notes and prior charts for additional history.     Date: 08/28/2012  Rate: 62  Rhythm: normal sinus rhythm  QRS Axis: normal  Intervals: normal  ST/T Wave abnormalities: normal  Conduction Disutrbances: none  Narrative Interpretation: no acute change from prior   Recheck no dizziness. No unsteadiness of gait. No problems w balance or coordination. No other neuro signs or symptoms.   Ct neg acute. Labs unremarkable.    Pt appears stable for d/c.  Discussed close pcp follow up Monday, return to ER if worse, symptoms recur, new symptoms, other concern.   Given paranasal sinus dis/sinuisitis on ct, paranasal/frontal sinus area pain, will rx for possible sinusitis. augmentin po.           Suzi Roots, MD 08/28/12 0454  Suzi Roots, MD 08/28/12 [phone]

## 2012-08-30 ENCOUNTER — Encounter (HOSPITAL_COMMUNITY): Payer: Self-pay | Admitting: Pharmacy Technician

## 2012-09-08 ENCOUNTER — Encounter (HOSPITAL_COMMUNITY): Payer: Medicare Other | Attending: Oncology

## 2012-09-08 DIAGNOSIS — C189 Malignant neoplasm of colon, unspecified: Secondary | ICD-10-CM | POA: Diagnosis not present

## 2012-09-08 DIAGNOSIS — C180 Malignant neoplasm of cecum: Secondary | ICD-10-CM | POA: Diagnosis not present

## 2012-09-08 LAB — BASIC METABOLIC PANEL
BUN: 7 mg/dL (ref 6–23)
CO2: 27 mEq/L (ref 19–32)
Calcium: 9.3 mg/dL (ref 8.4–10.5)
Glucose, Bld: 112 mg/dL — ABNORMAL HIGH (ref 70–99)
Sodium: 139 mEq/L (ref 135–145)

## 2012-09-08 NOTE — Progress Notes (Signed)
Labs drawn today for bmp 

## 2012-09-09 ENCOUNTER — Ambulatory Visit (HOSPITAL_COMMUNITY)
Admission: RE | Admit: 2012-09-09 | Discharge: 2012-09-09 | Disposition: A | Discharge status: Home / Self Care | Payer: Medicare Other | Source: Ambulatory Visit | Attending: Oncology | Admitting: Oncology

## 2012-09-09 DIAGNOSIS — C189 Malignant neoplasm of colon, unspecified: Secondary | ICD-10-CM | POA: Diagnosis not present

## 2012-09-09 DIAGNOSIS — N200 Calculus of kidney: Secondary | ICD-10-CM | POA: Diagnosis not present

## 2012-09-09 DIAGNOSIS — Z09 Encounter for follow-up examination after completed treatment for conditions other than malignant neoplasm: Secondary | ICD-10-CM | POA: Insufficient documentation

## 2012-09-09 DIAGNOSIS — N281 Cyst of kidney, acquired: Secondary | ICD-10-CM | POA: Diagnosis not present

## 2012-09-09 MED ORDER — IOHEXOL 300 MG/ML  SOLN
100.0000 mL | Freq: Once | INTRAMUSCULAR | Status: AC | PRN
  Administered 2012-09-09: 100 mL via INTRAVENOUS

## 2012-09-14 MED ORDER — SODIUM CHLORIDE 0.45 % IV SOLN: INTRAVENOUS | Status: DC

## 2012-09-16 ENCOUNTER — Encounter (HOSPITAL_COMMUNITY)
Admission: RE | Disposition: A | Discharge status: Home / Self Care | Payer: Self-pay | Source: Ambulatory Visit | Attending: Internal Medicine

## 2012-09-16 ENCOUNTER — Ambulatory Visit (HOSPITAL_COMMUNITY)
Admission: RE | Admit: 2012-09-16 | Discharge: 2012-09-16 | Disposition: A | Discharge status: Home / Self Care | Payer: Medicare Other | Source: Ambulatory Visit | Attending: Internal Medicine | Admitting: Internal Medicine

## 2012-09-16 ENCOUNTER — Encounter (HOSPITAL_COMMUNITY): Payer: Self-pay | Admitting: *Deleted

## 2012-09-16 DIAGNOSIS — Z9079 Acquired absence of other genital organ(s): Secondary | ICD-10-CM | POA: Insufficient documentation

## 2012-09-16 DIAGNOSIS — Z9049 Acquired absence of other specified parts of digestive tract: Secondary | ICD-10-CM

## 2012-09-16 DIAGNOSIS — D126 Benign neoplasm of colon, unspecified: Secondary | ICD-10-CM

## 2012-09-16 DIAGNOSIS — D129 Benign neoplasm of anus and anal canal: Secondary | ICD-10-CM | POA: Insufficient documentation

## 2012-09-16 DIAGNOSIS — D128 Benign neoplasm of rectum: Secondary | ICD-10-CM | POA: Insufficient documentation

## 2012-09-16 DIAGNOSIS — Z85038 Personal history of other malignant neoplasm of large intestine: Secondary | ICD-10-CM

## 2012-09-16 DIAGNOSIS — K5730 Diverticulosis of large intestine without perforation or abscess without bleeding: Secondary | ICD-10-CM | POA: Insufficient documentation

## 2012-09-16 DIAGNOSIS — Z1211 Encounter for screening for malignant neoplasm of colon: Secondary | ICD-10-CM | POA: Diagnosis not present

## 2012-09-16 HISTORY — PX: COLONOSCOPY: SHX5424

## 2012-09-16 SURGERY — COLONOSCOPY
Anesthesia: Moderate Sedation

## 2012-09-16 MED ORDER — MEPERIDINE HCL 50 MG/ML IJ SOLN
INTRAMUSCULAR | Status: DC | PRN
  Administered 2012-09-16 (×2): 25 mg via INTRAVENOUS

## 2012-09-16 MED ORDER — MIDAZOLAM HCL 5 MG/5ML IJ SOLN
INTRAMUSCULAR | Status: DC | PRN
  Administered 2012-09-16 (×3): 2 mg via INTRAVENOUS

## 2012-09-16 MED ORDER — MEPERIDINE HCL 50 MG/ML IJ SOLN
INTRAMUSCULAR | Status: AC
  Filled 2012-09-16: qty 1

## 2012-09-16 MED ORDER — SODIUM CHLORIDE 0.9 % IV SOLN
INTRAVENOUS | Status: DC
  Administered 2012-09-16: 1000 mL via INTRAVENOUS

## 2012-09-16 MED ORDER — MIDAZOLAM HCL 5 MG/5ML IJ SOLN
INTRAMUSCULAR | Status: AC
  Filled 2012-09-16: qty 10

## 2012-09-16 MED ORDER — STERILE WATER FOR IRRIGATION IR SOLN
Status: DC | PRN
  Administered 2012-09-16: 09:00:00

## 2012-09-16 NOTE — Op Note (Signed)
COLONOSCOPY PROCEDURE REPORT  PATIENT:  William Burns  MR#:  [id number] Birthdate:  [birth year], 67 y.o., male Endoscopist:  Dr. Malissa Hippo, MD Referred By:  Dr. Dwana Melena, MD Procedure Date: 09/16/2012  Procedure:   Colonoscopy  Indications:  Patient is 67 year old Caucasian male with history of colon carcinoma status post right, colectomy in February 2013 who is here for surveillance colonoscopy. He has no GI symptoms and he remains in remission.  Informed Consent:  The procedure and risks were reviewed with the patient and informed consent was obtained.  Medications:  Demerol 50 mg IV Versed 6 mg IV  Description of procedure:  After a digital rectal exam was performed, that colonoscope was advanced from the anus through the rectum and colon to the area of the cecum, ileocecal valve and appendiceal orifice. The cecum was deeply intubated. These structures were well-seen and photographed for the record. From the level of the cecum and ileocecal valve, the scope was slowly and cautiously withdrawn. The mucosal surfaces were carefully surveyed utilizing scope tip to flexion to facilitate fold flattening as needed. The scope was pulled down into the rectum where a thorough exam including retroflexion was performed.  Findings:   Prep satisfactory. Wide-open ileocolonic anastomosis. Few small diverticula at sigmoid colon. 5 mm polyp cold snared from rectosigmoid junction. Normal rectal mucosa and anal rectal junction.   Therapeutic/Diagnostic Maneuvers Performed:  See above  Complications:  None  Colon Withdrawal Time:  12 minutes  Impression:  Examination performed to ileocolonic anastomosis located in the region of hepatic flexure. Few small diverticula at sigmoid colon. 5 mm polyp cold snared from rectosigmoid.  Recommendations:  Standard instructions given. I will contact patient with biopsy results and further recommendations. Next colonoscopy in 3  years.  REHMAN,NAJEEB U  09/16/2012 10:03 AM  CC: Dr. Dwana Melena, MD & Dr. Bonnetta Barry ref. provider found         Dr. Glenford Peers, MD

## 2012-09-16 NOTE — H&P (Signed)
William Burns is an 67 y.o. male.   Chief Complaint: Patient is here for colonoscopy. 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 He remains in remission. He has good appetite. He denies abdominal pain rectal bleeding. He is undergoing surveillance colonoscopy.  Past Medical History  Diagnosis Date  . BPH (benign prostatic hyperplasia)   . Kidney stones 2011  . Tooth infection 07/03/2011  . Colonic mass 07/03/2011  . Erosive gastritis 07/03/2011  . Renal calculus   . Difficult intubation     wife states they were told he had to have an "extra long tube" for intubation, had trouble with this when he had colon resection 07-04-11 Dr Shanda Howells  . Cancer     colon  . Cataract     Past Surgical History  Procedure Laterality Date  . Hernia repair    . Prostate biopsy    . Esophagogastroduodenoscopy  07/03/2011    Procedure: ESOPHAGOGASTRODUODENOSCOPY (EGD);  Surgeon: Malissa Hippo, MD;  Location: [facility];  Service: Endoscopy;  Laterality: N/A;  . Colonoscopy  07/03/2011    Procedure: COLONOSCOPY;  Surgeon: Malissa Hippo, MD;  Location: [facility];  Service: Endoscopy;  Laterality: N/A;  . Partial colectomy  07/04/2011    Procedure: PARTIAL COLECTOMY;  Surgeon: Dalia Heading, MD;  Location: [facility];  Service: General;  Laterality: N/A;  . Cystoscopy  07/09/2011    Procedure: Derinda Late;  Surgeon: Ky Barban, MD;  Location: [facility];  Service: Urology;  Laterality: N/A;  . Colon surgery    . Transurethral resection of prostate  07/11/2011    Procedure: TRANSURETHRAL RESECTION OF THE PROSTATE (TURP);  Surgeon: Ky Barban, MD;  Location: [facility];  Service: Urology;  Laterality: N/A;  . Cystoscopy with litholapaxy  07/11/2011    Procedure: CYSTOSCOPY WITH LITHOLAPAXY;  Surgeon: Ky Barban, MD;  Location: [facility];  Service: Urology;  Laterality: N/A;  . Cataract extraction  w/phaco Left 08/23/2012    Procedure: CATARACT EXTRACTION PHACO AND INTRAOCULAR LENS PLACEMENT (IOC);  Surgeon: Gemma Payor, MD;  Location: [facility];  Service: Ophthalmology;  Laterality: Left;  CDE 16.64    Family History  Problem Relation Age of Onset  . Alzheimer's disease Mother   . Alzheimer's disease Father   . Aneurysm Brother     brain  . Anuerysm Brother   . Colon cancer Neg Hx   . Healthy Son   . Healthy Son    Social History:  reports that he quit smoking about 23 years ago. His smoking use included Cigarettes. He has a 50 pack-year smoking history. He has never used smokeless tobacco. He reports that he does not drink alcohol or use illicit drugs.  Allergies: No Known Allergies  Medications Prior to Admission  Medication Sig Dispense Refill  . fish oil-omega-3 fatty acids 1000 MG capsule Take 1 g by mouth daily.      . Multiple Vitamin (MULTIVITAMIN WITH MINERALS) TABS Take 1 tablet by mouth daily.      . peg 3350 powder (MOVIPREP) 100 G SOLR Take 1 kit (100 g total) by mouth once.  1 kit  0    No results found for this or any previous visit (from the past 48 hour(s)). No results found.  ROS  Blood pressure 146/78, pulse 59, temperature 98 F (36.7 C), temperature source Oral, resp. rate 20, SpO2 94.00%. Physical Exam  Constitutional: He appears well-developed and well-nourished.  HENT:  Mouth/Throat: Oropharynx is clear and moist.  Eyes: Conjunctivae are normal. No scleral icterus.  Neck: No thyromegaly present.  Cardiovascular: Normal rate, regular rhythm and normal heart sounds.   No murmur heard. Respiratory: Effort normal and breath sounds normal.  GI: Soft. He exhibits no distension and no mass. There is no tenderness.  Scar at right mid and low quadrant.  Musculoskeletal: He exhibits no edema.  Lymphadenopathy:    He has no cervical adenopathy.  Neurological: He is alert.  Skin: Skin is warm.     Assessment/Plan History of colon  carcinoma. Surveillance colonoscopy.  Chanel Mcadams U 09/16/2012, 9:29 AM

## 2012-09-20 ENCOUNTER — Encounter (INDEPENDENT_AMBULATORY_CARE_PROVIDER_SITE_OTHER): Payer: Self-pay | Admitting: *Deleted

## 2012-09-20 ENCOUNTER — Encounter (HOSPITAL_COMMUNITY): Payer: Self-pay | Admitting: Internal Medicine

## 2012-10-20 ENCOUNTER — Encounter (HOSPITAL_COMMUNITY): Payer: Medicare Other | Attending: Oncology

## 2012-10-20 DIAGNOSIS — C180 Malignant neoplasm of cecum: Secondary | ICD-10-CM | POA: Diagnosis not present

## 2012-10-20 DIAGNOSIS — C189 Malignant neoplasm of colon, unspecified: Secondary | ICD-10-CM | POA: Diagnosis not present

## 2012-10-20 NOTE — Progress Notes (Signed)
Labs drawn today for cea 

## 2012-10-21 LAB — CEA: CEA: 1.7 ng/mL (ref 0.0–5.0)

## 2012-11-22 ENCOUNTER — Other Ambulatory Visit: Payer: Self-pay

## 2012-11-22 ENCOUNTER — Emergency Department (HOSPITAL_COMMUNITY): Payer: Medicare Other

## 2012-11-22 ENCOUNTER — Emergency Department (HOSPITAL_COMMUNITY)
Admission: EM | Admit: 2012-11-22 | Discharge: 2012-11-22 | Disposition: A | Discharge status: Home / Self Care | Payer: Medicare Other | Attending: Emergency Medicine | Admitting: Emergency Medicine

## 2012-11-22 DIAGNOSIS — Z87442 Personal history of urinary calculi: Secondary | ICD-10-CM | POA: Diagnosis not present

## 2012-11-22 DIAGNOSIS — Z85038 Personal history of other malignant neoplasm of large intestine: Secondary | ICD-10-CM | POA: Diagnosis not present

## 2012-11-22 DIAGNOSIS — S0993XA Unspecified injury of face, initial encounter: Secondary | ICD-10-CM | POA: Diagnosis not present

## 2012-11-22 DIAGNOSIS — S060X0A Concussion without loss of consciousness, initial encounter: Secondary | ICD-10-CM | POA: Diagnosis not present

## 2012-11-22 DIAGNOSIS — Z79899 Other long term (current) drug therapy: Secondary | ICD-10-CM | POA: Insufficient documentation

## 2012-11-22 DIAGNOSIS — Y9289 Other specified places as the place of occurrence of the external cause: Secondary | ICD-10-CM | POA: Insufficient documentation

## 2012-11-22 DIAGNOSIS — R4182 Altered mental status, unspecified: Secondary | ICD-10-CM | POA: Insufficient documentation

## 2012-11-22 DIAGNOSIS — Z9889 Other specified postprocedural states: Secondary | ICD-10-CM | POA: Diagnosis not present

## 2012-11-22 DIAGNOSIS — Z8669 Personal history of other diseases of the nervous system and sense organs: Secondary | ICD-10-CM | POA: Insufficient documentation

## 2012-11-22 DIAGNOSIS — Z87448 Personal history of other diseases of urinary system: Secondary | ICD-10-CM | POA: Insufficient documentation

## 2012-11-22 DIAGNOSIS — S0990XA Unspecified injury of head, initial encounter: Secondary | ICD-10-CM | POA: Diagnosis not present

## 2012-11-22 DIAGNOSIS — Z8719 Personal history of other diseases of the digestive system: Secondary | ICD-10-CM | POA: Insufficient documentation

## 2012-11-22 DIAGNOSIS — Y9389 Activity, other specified: Secondary | ICD-10-CM | POA: Insufficient documentation

## 2012-11-22 DIAGNOSIS — Z87891 Personal history of nicotine dependence: Secondary | ICD-10-CM | POA: Insufficient documentation

## 2012-11-22 DIAGNOSIS — R413 Other amnesia: Secondary | ICD-10-CM | POA: Diagnosis not present

## 2012-11-22 DIAGNOSIS — W1789XA Other fall from one level to another, initial encounter: Secondary | ICD-10-CM | POA: Insufficient documentation

## 2012-11-22 NOTE — ED Notes (Addendum)
States that he started having a headache around 8 am.  States that he fell off his tractor.  Family member states that he patient is not acting like himself and is unable to remember anything and asking repetitive questions.  C-collar placed on patient due to complaint of neck pain.  The patient denies loss of consciousness or hitting his head.
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
## 2012-11-24 ENCOUNTER — Encounter (HOSPITAL_COMMUNITY): Payer: Self-pay | Admitting: Emergency Medicine

## 2013-01-12 ENCOUNTER — Encounter (HOSPITAL_COMMUNITY): Payer: Medicare Other | Attending: Oncology

## 2013-01-12 DIAGNOSIS — C189 Malignant neoplasm of colon, unspecified: Secondary | ICD-10-CM | POA: Insufficient documentation

## 2013-01-12 DIAGNOSIS — C180 Malignant neoplasm of cecum: Secondary | ICD-10-CM | POA: Diagnosis not present

## 2013-01-12 NOTE — Progress Notes (Signed)
Labs drawn today for cea 

## 2013-01-13 LAB — CEA: CEA: 1.6 ng/mL (ref 0.0–5.0)

## 2013-01-28 ENCOUNTER — Encounter (HOSPITAL_COMMUNITY): Payer: Self-pay

## 2013-01-28 ENCOUNTER — Ambulatory Visit (HOSPITAL_COMMUNITY): Payer: Medicare Other | Admitting: Oncology

## 2013-01-28 ENCOUNTER — Encounter (HOSPITAL_BASED_OUTPATIENT_CLINIC_OR_DEPARTMENT_OTHER): Payer: Medicare Other

## 2013-01-28 VITALS — BP 162/79 | HR 80 | Temp 97.8°F | Resp 18 | Wt 242.4 lb

## 2013-01-28 DIAGNOSIS — C180 Malignant neoplasm of cecum: Secondary | ICD-10-CM

## 2013-01-28 DIAGNOSIS — C189 Malignant neoplasm of colon, unspecified: Secondary | ICD-10-CM

## 2013-01-28 NOTE — Progress Notes (Signed)
[facility] Telephone:([phone]) [phone]   Fax:([phone]  OFFICE PROGRESS NOTE  Catalina Pizza, MD  [address]  [place] [address]  DIAGNOSIS: Stage II colon.   INTERVAL HISTORY:   Per Dr Thornton Papas note; "Stage IIA (T3 N0) adenocarcinoma of the colon occurring in the cecum which was felt to be a grade 2 tumor, 8.5 cm, with  pericolonic connective tissue involvement but all margins negative. No lymphovascular invasion. Twenty-eight lymph nodes were  examined, all of which were negative for metastatic disease. He is status post surgery on 07/07/2011.  Patient did not receive adjuvant therapy and he is being monitored since surgery without evidence of recurrence. Last colonoscopy in 09/16/2012 by Dr. Marcy Salvo she'll 5 interbody at the rectosigmoid junction and normal rectal mucosa.  Additionally few small diverticula in the sigmoid colon and a wide open ileocolonic anastomosis was seen. Pathology of the rectosigmoid polyp showed sessile adenoma without high-grade dysplasia or malignancy.  William Burns 67 y.o. male returns to the clinic today for with his wife and his wife's and his wife's daughter Deloria Lair.  He tells me that he feels well and denies any new problems.  Denies shortness of breath or fatigue.  He denies bloody stool or melena.  He is a denies abdominal pain.  He denies unintended weight loss.  MEDICAL HISTORY: Past Medical History  Diagnosis Date  . BPH (benign prostatic hyperplasia)   . Kidney stones 2011  . Tooth infection 07/03/2011  . Colonic mass 07/03/2011  . Erosive gastritis 07/03/2011  . Renal calculus   . Difficult intubation     wife states they were told he had to have an "extra long tube" for intubation, had trouble with this when he had colon resection 07-04-11 Dr Shanda Howells  . Cancer     colon  . Cataract     ALLERGIES:  has No Known Allergies.  MEDICATIONS:  Current Outpatient Prescriptions  Medication Sig Dispense Refill  . fish  oil-omega-3 fatty acids 1000 MG capsule Take 1 g by mouth daily.      . Multiple Vitamin (MULTIVITAMIN WITH MINERALS) TABS Take 1 tablet by mouth daily.       No current facility-administered medications for this visit.    SURGICAL HISTORY:  Past Surgical History  Procedure Laterality Date  . Hernia repair    . Prostate biopsy    . Esophagogastroduodenoscopy  07/03/2011    Procedure: ESOPHAGOGASTRODUODENOSCOPY (EGD);  Surgeon: Malissa Hippo, MD;  [facility];  Service: Endoscopy;  Laterality: N/A;  . Colonoscopy  07/03/2011    Procedure: COLONOSCOPY;  Surgeon: Malissa Hippo, MD;  [facility];  Service: Endoscopy;  Laterality: N/A;  . Partial colectomy  07/04/2011    Procedure: PARTIAL COLECTOMY;  Surgeon: Dalia Heading, MD;  Location[facility];  Service: General;  Laterality: N/A;  . Cystoscopy  07/09/2011    Procedure: Derinda Late;  Surgeon: Ky Barban, MD;  Location: [facility];  Service: Urology;  Laterality: N/A;  . Colon surgery    . Transurethral resection of prostate  07/11/2011    Procedure: TRANSURETHRAL RESECTION OF THE PROSTATE (TURP);  Surgeon: Ky Barban, MD;  [facility]: [facility];  Service: Urology;  Laterality: N/A;  . Cystoscopy with litholapaxy  07/11/2011    Procedure: CYSTOSCOPY WITH LITHOLAPAXY;  Surgeon: Ky Barban, MD;  Location: [facility];  Service: Urology;  Laterality: N/A;  . Cataract extraction w/phaco Left 08/23/2012  Procedure: CATARACT EXTRACTION PHACO AND INTRAOCULAR LENS PLACEMENT (IOC);  Surgeon: Gemma Payor, MD;  Location: [facility];  Service: Ophthalmology;  Laterality: Left;  CDE 16.64  . Colonoscopy N/A 09/16/2012    Procedure: COLONOSCOPY;  Surgeon: Malissa Hippo, MD;  [facility];  Service: Endoscopy;  Laterality: N/A;  930     REVIEW OF SYSTEMS: 14 point review of system is as in the history above otherwise negative.  PHYSICAL EXAMINATION:  Blood pressure 162/79, pulse 80, temperature 97.8 F (36.6  C), temperature source Oral, resp. rate 18, weight 242 lb 6.4 oz (109.952 kg). GENERAL: No acute distress. SKIN:  No rashes or significant lesions . No ecchymosis or petechial rash. HEAD: Normocephalic, No masses, lesions, tenderness or abnormalities  EYES: Conjunctiva are pink and non-injected and no jaundice ENT: External ears normal ,lips, buccal mucosa, and tongue normal and mucous membranes are moist . No evidence of thrush. LYMPH: No palpable lymphadenopathy, in the neck, supraclavicular areas or axilla. LUNGS: Clear to auscultation , no crackles or wheezes HEART: regular rate & rhythm, no murmurs, no gallops, S1 normal and S2 normal and no S3. ABDOMEN: Abdomen soft, non-tender, no masses or organomegaly and no hepatosplenomegaly palpable. Well-healed surgical scars is noted. EXTREMITIES: No edema, no skin discoloration or tenderness NEURO: Alert & oriented , no focal motor  deficits.     LABORATORY DATA: Lab Results  Component Value Date   WBC 7.8 08/28/2012   HGB 15.0 08/28/2012   HCT 43.9 08/28/2012   MCV 86.6 08/28/2012   PLT 242 08/28/2012      Chemistry      Component Value Date/Time   NA 139 09/08/2012 1056   K 3.9 09/08/2012 1056   CL 104 09/08/2012 1056   CO2 27 09/08/2012 1056   BUN 7 09/08/2012 1056   CREATININE 0.90 09/08/2012 1056      Component Value Date/Time   CALCIUM 9.3 09/08/2012 1056   ALKPHOS 77 08/28/2012 1700   AST 32 08/28/2012 1700   ALT 22 08/28/2012 1700   BILITOT 0.6 08/28/2012 1700       RADIOGRAPHIC STUDIES: CT abdomen and pelvis 09/09/2012 showed no evidence of metastasis.  Sclerotic lesion in the left iliac bone was noted but unchanged from previous exam on 07/19/2011.   ASSESSMENT:  Patient has no evidence of disease at this time.  His last Colonoscopy 08/2012 showed sessile adenoma and last abdominal CT was negative for evidence of malignancy. CEA is also unremarkable.   PLAN:  1.Return to clinic in 6 months. 2. Surveillance CT of chest abdomen  and pelvis in 6 months. 3. Repeat iron studies CBC CMP in 6 months.  Continue 3 monthly  CEAs.. 4. Surveillance colonoscopy  as recommended by GI.  5. Patient informed that the 3 year outcome a good surrogate of five-year outcome   All questions were satisfactorily answered. Patient knows to call if  any concern arises.  I spent more than 50 % counseling the patient face to face. The total time spent in the appointment was 30 minutes.   Sherral Hammers, MD FACP. Hematology/Oncology.

## 2013-01-28 NOTE — Patient Instructions (Addendum)
[facility] Discharge Instructions  RECOMMENDATIONS MADE BY THE CONSULTANT AND ANY TEST RESULTS WILL BE SENT TO YOUR REFERRING PHYSICIAN.  Lab work in 6 months. CT scan in 6 months. MD appointment after CT scan in 6 months. Report any issues/concerns to clinic as needed prior to appointments.  Thank you for choosing [facility] to provide your oncology and hematology care.  To afford each patient quality time with our providers, please arrive at least 15 minutes before your scheduled appointment time.  With your help, our goal is to use those 15 minutes to complete the necessary work-up to ensure our physicians have the information they need to help with your evaluation and healthcare recommendations.    Effective January 1st, 2014, we ask that you re-schedule your appointment with our physicians should you arrive 10 or more minutes late for your appointment.  We strive to give you quality time with our providers, and arriving late affects you and other patients whose appointments are after yours.    Again, thank you for choosing [facility].  Our hope is that these requests will decrease the amount of time that you wait before being seen by our physicians.       _____________________________________________________________  Should you have questions after your visit to [facility], please contact our office at [phone] between the hours of 8:30 a.m. and 5:00 p.m.  Voicemails left after 4:30 p.m. will not be returned until the following business day.  For prescription refill requests, have your pharmacy contact our office with your prescription refill request.

## 2013-02-08 ENCOUNTER — Encounter (INDEPENDENT_AMBULATORY_CARE_PROVIDER_SITE_OTHER): Payer: Self-pay | Admitting: Internal Medicine

## 2013-02-08 ENCOUNTER — Ambulatory Visit (INDEPENDENT_AMBULATORY_CARE_PROVIDER_SITE_OTHER): Payer: Medicare Other | Admitting: Internal Medicine

## 2013-02-08 VITALS — BP 140/70 | HR 82 | Temp 98.0°F | Resp 20 | Ht 75.0 in | Wt 243.9 lb

## 2013-02-08 DIAGNOSIS — K2270 Barrett's esophagus without dysplasia: Secondary | ICD-10-CM

## 2013-02-08 DIAGNOSIS — K219 Gastro-esophageal reflux disease without esophagitis: Secondary | ICD-10-CM | POA: Diagnosis not present

## 2013-02-08 DIAGNOSIS — C189 Malignant neoplasm of colon, unspecified: Secondary | ICD-10-CM

## 2013-02-08 MED ORDER — PANTOPRAZOLE SODIUM 40 MG PO TBEC: 40.0000 mg | DELAYED_RELEASE_TABLET | Freq: Every day | ORAL | Status: DC

## 2013-02-08 NOTE — Patient Instructions (Signed)
Antireflux measures. Notify if you have swallowing difficulty.

## 2013-02-08 NOTE — Progress Notes (Signed)
Presenting complaint;  Followup for GERD. History of CRC.  Subjective:  Patient is 67 year old Caucasian male who presented in January 2013 with progressive weakness and hemoglobin of 7.8 g. On workup he was found to have short segment Barrett's esophagus and cecal adenocarcinoma. He underwent right hemicolectomy by Dr. Lovell Sheehan. Final stage was IIa or T3 N0. He was seen by Dr. Mariel Sleet who recommended no chemotherapy. He is being followed at oncology clinic and CEA levels have remained normal. He underwent surveillance colonoscopy in April of this year with removal of 7 mm polyp from rectosigmoid junction which turned out to be serrated adenoma. He feels fine. He has very good appetite. He denies heartburn regurgitation dysphagia abdominal pain melena or rectal bleeding. He decided to stop pantoprazole since he is now experienced heartburn.  Current Medications: Current Outpatient Prescriptions  Medication Sig Dispense Refill  . fish oil-omega-3 fatty acids 1000 MG capsule Take 1 g by mouth daily.      . Multiple Vitamin (MULTIVITAMIN WITH MINERALS) TABS Take 1 tablet by mouth daily.       No current facility-administered medications for this visit.     Objective: Blood pressure 140/70, pulse 82, temperature 98 F (36.7 C), temperature source Oral, resp. rate 20, height 6\' 3"  (1.905 m), weight 243 lb 14.4 oz (110.632 kg). Patient is alert and in no acute distress. Conjunctiva is pink. Sclera is nonicteric Oropharyngeal mucosa is normal. No neck masses or thyromegaly noted. Cardiac exam with regular rhythm normal S1 and S2. No murmur or gallop noted. Lungs are clear to auscultation. Abdomen symmetrical soft and nontender without organomegaly or masses. He has right paramedian scar.  No LE edema or clubbing noted.  Labs/studies Results: CEA on 01/12/2013 was 1.6. H&H on 08/28/2012 was 15 and 43.9   Assessment:  #1. Short segment Barrett's esophagus. This condition only occurs  in the setting of chronic GERD. Patient therefore is a silent reflux or since he denies chronic heartburn or regurgitation. PPI therapy would be very appropriate in this setting. PPI therapy would be very important in this setting. I believe the risk of taking PPI would be less than the risk of not taking this medication. #2. History of cecal adenocarcinoma status post right hemicolectomy in February 2013. He has not required a chemotherapy and he remains in remission. Last colonoscopy was in April of this year.   Plan:  Continue anti-reflux measures. Pantoprazole 40 mg by mouth every morning. Patient encouraged to do his own research on Barrett's esophagus and call if he has any questions. Office visit in one year. Will consider EGD at the time of next colonoscopy in April 2017.

## 2013-04-06 ENCOUNTER — Encounter (HOSPITAL_COMMUNITY): Payer: Medicare Other | Attending: Oncology

## 2013-04-06 DIAGNOSIS — C180 Malignant neoplasm of cecum: Secondary | ICD-10-CM

## 2013-04-06 DIAGNOSIS — C189 Malignant neoplasm of colon, unspecified: Secondary | ICD-10-CM | POA: Insufficient documentation

## 2013-04-06 NOTE — Progress Notes (Signed)
Labs drawn today for cea 

## 2013-05-18 DIAGNOSIS — Z961 Presence of intraocular lens: Secondary | ICD-10-CM | POA: Diagnosis not present

## 2013-05-18 DIAGNOSIS — H2589 Other age-related cataract: Secondary | ICD-10-CM | POA: Diagnosis not present

## 2013-05-18 DIAGNOSIS — H5231 Anisometropia: Secondary | ICD-10-CM | POA: Diagnosis not present

## 2013-05-31 ENCOUNTER — Encounter (HOSPITAL_COMMUNITY): Payer: Self-pay | Admitting: Pharmacy Technician

## 2013-06-01 ENCOUNTER — Encounter (HOSPITAL_COMMUNITY): Payer: Self-pay

## 2013-06-01 ENCOUNTER — Encounter (HOSPITAL_COMMUNITY)
Admission: RE | Admit: 2013-06-01 | Discharge: 2013-06-01 | Disposition: A | Discharge status: Home / Self Care | Payer: Medicare Other | Source: Ambulatory Visit | Attending: Ophthalmology | Admitting: Ophthalmology

## 2013-06-01 DIAGNOSIS — Z01812 Encounter for preprocedural laboratory examination: Secondary | ICD-10-CM | POA: Diagnosis not present

## 2013-06-01 DIAGNOSIS — Z01818 Encounter for other preprocedural examination: Secondary | ICD-10-CM | POA: Diagnosis not present

## 2013-06-01 LAB — BASIC METABOLIC PANEL
CO2: 29 mEq/L (ref 19–32)
Calcium: 9.4 mg/dL (ref 8.4–10.5)
Glucose, Bld: 148 mg/dL — ABNORMAL HIGH (ref 70–99)
Potassium: 4.2 mEq/L (ref 3.7–5.3)
Sodium: 141 mEq/L (ref 137–147)

## 2013-06-01 NOTE — Patient Instructions (Signed)
William Burns  06/01/2013   Your procedure is scheduled on:  Thursday, January 8  Report to [facility] at [id number].  Call this number if you have problems the morning of surgery: [phone]   Remember:   Do not eat food or drink liquids after midnight.   Take these medicines the morning of surgery with A SIP OF WATER: Protonix   Do not wear jewelry, make-up or nail polish.  Do not wear lotions, powders, or perfumes. You may wear deodorant.  Do not shave 48 hours prior to surgery. Men may shave face and neck.  Do not bring valuables to the hospital.  [facility] is not responsible for any belongings or valuables.               Contacts, dentures or bridgework may not be worn into surgery.  Leave suitcase in the car. After surgery it may be brought to your room.  For patients admitted to the hospital, discharge time is determined by your treatment team.               Patients discharged the day of surgery will not be allowed to drive  home.  Name and phone number of your driver: Family or friend  Special Instructions: N/A   Please read over the following fact sheets that you were given: Pain Booklet, Coughing and Deep Breathing, MRSA Information, Surgical Site Infection Prevention, Anesthesia Post-op Instructions and Care and Recovery After Surgery  PATIENT INSTRUCTIONS POST-ANESTHESIA  IMMEDIATELY FOLLOWING SURGERY:  Do not drive or operate machinery for the first twenty four hours after surgery.  Do not make any important decisions for twenty four hours after surgery or while taking narcotic pain medications or sedatives.  If you develop intractable nausea and vomiting or a severe headache please notify your doctor immediately.  FOLLOW-UP:  Please make an appointment with your surgeon as instructed. You do not need to follow up with anesthesia unless specifically instructed to do so.  WOUND CARE INSTRUCTIONS (if applicable):  Keep a dry clean dressing on the anesthesia/puncture  wound site if there is drainage.  Once the wound has quit draining you may leave it open to air.  Generally you should leave the bandage intact for twenty four hours unless there is drainage.  If the epidural site drains for more than 36-48 hours please call the anesthesia department.  QUESTIONS?:  Please feel free to call your physician or the hospital operator if you have any questions, and they will be happy to assist you.      Cataract Surgery  A cataract is a clouding of the lens of the eye. When a lens becomes cloudy, vision is reduced based on the degree and nature of the clouding. Surgery may be needed to improve vision. Surgery removes the cloudy lens and usually replaces it with a substitute lens (intraocular lens, IOL). LET YOUR EYE DOCTOR KNOW ABOUT:  Allergies to food or medicine.  Medicines taken including herbs, eyedrops, over-the-counter medicines, and creams.  Use of steroids (by mouth or creams).  Previous problems with anesthetics or numbing medicine.  History of bleeding problems or blood clots.  Previous surgery.  Other health problems, including diabetes and kidney problems.  Possibility of pregnancy, if this applies. RISKS AND COMPLICATIONS  Infection.  Inflammation of the eyeball (endophthalmitis) that can spread to both eyes (sympathetic ophthalmia).  Poor wound healing.  If an IOL is inserted, it can later fall out of proper position. This is very uncommon.  Clouding of the part of your eye that holds an IOL in place. This is called an "after-cataract." These are uncommon, but easily treated. BEFORE THE PROCEDURE  Do not eat or drink anything except small amounts of water for 8 to 12 before your surgery, or as directed by your caregiver.  Unless you are told otherwise, continue any eyedrops you have been prescribed.  Talk to your primary caregiver about all other medicines that you take (both prescription and non-prescription). In some cases, you  may need to stop or change medicines near the time of your surgery. This is most important if you are taking blood-thinning medicine.Do not stop medicines unless you are told to do so.  Arrange for someone to drive you to and from the procedure.  Do not put contact lenses in either eye on the day of your surgery. PROCEDURE There is more than one method for safely removing a cataract. Your doctor can explain the differences and help determine which is best for you. Phacoemulsification surgery is the most common form of cataract surgery.  An injection is given behind the eye or eyedrops are given to make this a painless procedure.  A small cut (incision) is made on the edge of the clear, dome-shaped surface that covers the front of the eye (cornea).  A tiny probe is painlessly inserted into the eye. This device gives off ultrasound waves that soften and break up the cloudy center of the lens. This makes it easier for the cloudy lens to be removed by suction.  An IOL may be implanted.  The normal lens of the eye is covered by a clear capsule. Part of that capsule is intentionally left in the eye to support the IOL.  Your surgeon may or may not use stitches to close the incision. There are other forms of cataract surgery that require a larger incision and stiches to close the eye. This approach is taken in cases where the doctor feels that the cataract cannot be easily removed using phacoemulsification. AFTER THE PROCEDURE  When an IOL is implanted, it does not need care. It becomes a permanent part of your eye and cannot be seen or felt.  Your doctor will schedule follow-up exams to check on your progress.  Review your other medicines with your doctor to see which can be resumed after surgery.  Use eyedrops or take medicine as prescribed by your doctor.

## 2013-06-01 NOTE — Progress Notes (Signed)
06/01/13 1251  OBSTRUCTIVE SLEEP APNEA  Have you ever been diagnosed with sleep apnea through a sleep study? No  Do you snore loudly (loud enough to be heard through closed doors)?  1  Do you often feel tired, fatigued, or sleepy during the daytime? 0  Has anyone observed you stop breathing during your sleep? 1  Do you have, or are you being treated for high blood pressure? 0  BMI more than 35 kg/m2? 0  Age over 68 years old? 1  Neck circumference greater than 40 cm/18 inches? 0  Gender: 1  Obstructive Sleep Apnea Score 4  Score 4 or greater  Results sent to PCP

## 2013-06-06 DIAGNOSIS — H2589 Other age-related cataract: Secondary | ICD-10-CM | POA: Diagnosis not present

## 2013-06-08 MED ORDER — CYCLOPENTOLATE-PHENYLEPHRINE OP SOLN OPTIME - NO CHARGE
OPHTHALMIC | Status: AC
  Filled 2013-06-08: qty 2

## 2013-06-08 MED ORDER — LIDOCAINE HCL 3.5 % OP GEL
OPHTHALMIC | Status: AC
  Filled 2013-06-08: qty 1

## 2013-06-08 MED ORDER — LIDOCAINE HCL (PF) 1 % IJ SOLN
INTRAMUSCULAR | Status: AC
  Filled 2013-06-08: qty 2

## 2013-06-08 MED ORDER — NEOMYCIN-POLYMYXIN-DEXAMETH 3.5-10000-0.1 OP SUSP
OPHTHALMIC | Status: AC
  Filled 2013-06-08: qty 5

## 2013-06-08 MED ORDER — PHENYLEPHRINE HCL 2.5 % OP SOLN
OPHTHALMIC | Status: AC
  Filled 2013-06-08: qty 15

## 2013-06-08 MED ORDER — TETRACAINE HCL 0.5 % OP SOLN
OPHTHALMIC | Status: AC
Start: 2013-06-08 — End: 2013-06-08
  Filled 2013-06-08: qty 2

## 2013-06-09 ENCOUNTER — Encounter (HOSPITAL_COMMUNITY)
Admission: RE | Disposition: A | Discharge status: Home / Self Care | Payer: Self-pay | Source: Ambulatory Visit | Attending: Ophthalmology

## 2013-06-09 ENCOUNTER — Encounter (HOSPITAL_COMMUNITY): Payer: Medicare Other | Admitting: Anesthesiology

## 2013-06-09 ENCOUNTER — Ambulatory Visit (HOSPITAL_COMMUNITY)
Admission: RE | Admit: 2013-06-09 | Discharge: 2013-06-09 | Disposition: A | Discharge status: Home / Self Care | Payer: Medicare Other | Source: Ambulatory Visit | Attending: Ophthalmology | Admitting: Ophthalmology

## 2013-06-09 ENCOUNTER — Ambulatory Visit (HOSPITAL_COMMUNITY): Payer: Medicare Other | Admitting: Anesthesiology

## 2013-06-09 ENCOUNTER — Encounter (HOSPITAL_COMMUNITY): Payer: Self-pay

## 2013-06-09 DIAGNOSIS — H2589 Other age-related cataract: Secondary | ICD-10-CM | POA: Insufficient documentation

## 2013-06-09 DIAGNOSIS — H269 Unspecified cataract: Secondary | ICD-10-CM | POA: Diagnosis not present

## 2013-06-09 DIAGNOSIS — H2510 Age-related nuclear cataract, unspecified eye: Secondary | ICD-10-CM | POA: Diagnosis not present

## 2013-06-09 HISTORY — PX: CATARACT EXTRACTION W/PHACO: SHX586

## 2013-06-09 SURGERY — PHACOEMULSIFICATION, CATARACT, WITH IOL INSERTION
Anesthesia: Monitor Anesthesia Care | Site: Eye | Laterality: Right

## 2013-06-09 MED ORDER — LIDOCAINE HCL 3.5 % OP GEL
1.0000 "application " | Freq: Once | OPHTHALMIC | Status: AC
  Administered 2013-06-09: 1 via OPHTHALMIC

## 2013-06-09 MED ORDER — NEOMYCIN-POLYMYXIN-DEXAMETH 3.5-10000-0.1 OP SUSP
OPHTHALMIC | Status: DC | PRN
  Administered 2013-06-09: 2 [drp] via OPHTHALMIC

## 2013-06-09 MED ORDER — FENTANYL CITRATE 0.05 MG/ML IJ SOLN
INTRAMUSCULAR | Status: AC
  Filled 2013-06-09: qty 2

## 2013-06-09 MED ORDER — MIDAZOLAM HCL 2 MG/2ML IJ SOLN
1.0000 mg | INTRAMUSCULAR | Status: DC | PRN
  Administered 2013-06-09: 2 mg via INTRAVENOUS

## 2013-06-09 MED ORDER — ONDANSETRON HCL 4 MG/2ML IJ SOLN: 4.0000 mg | Freq: Once | INTRAMUSCULAR | Status: DC | PRN

## 2013-06-09 MED ORDER — PHENYLEPHRINE HCL 2.5 % OP SOLN
1.0000 [drp] | OPHTHALMIC | Status: AC
  Administered 2013-06-09 (×3): 1 [drp] via OPHTHALMIC

## 2013-06-09 MED ORDER — EPINEPHRINE HCL 1 MG/ML IJ SOLN
INTRAMUSCULAR | Status: AC
  Filled 2013-06-09: qty 1

## 2013-06-09 MED ORDER — LACTATED RINGERS IV SOLN
INTRAVENOUS | Status: DC | PRN
  Administered 2013-06-09: 08:00:00 via INTRAVENOUS

## 2013-06-09 MED ORDER — LIDOCAINE HCL (PF) 1 % IJ SOLN
INTRAMUSCULAR | Status: DC | PRN
  Administered 2013-06-09: .6 mL

## 2013-06-09 MED ORDER — CYCLOPENTOLATE-PHENYLEPHRINE 0.2-1 % OP SOLN
1.0000 [drp] | OPHTHALMIC | Status: AC
  Administered 2013-06-09 (×3): 1 [drp] via OPHTHALMIC

## 2013-06-09 MED ORDER — TETRACAINE HCL 0.5 % OP SOLN
1.0000 [drp] | OPHTHALMIC | Status: AC
  Administered 2013-06-09 (×3): 1 [drp] via OPHTHALMIC

## 2013-06-09 MED ORDER — PROVISC 10 MG/ML IO SOLN
INTRAOCULAR | Status: DC | PRN
  Administered 2013-06-09: 0.85 mL via INTRAOCULAR

## 2013-06-09 MED ORDER — FENTANYL CITRATE 0.05 MG/ML IJ SOLN
25.0000 ug | INTRAMUSCULAR | Status: AC
  Administered 2013-06-09 (×2): 25 ug via INTRAVENOUS

## 2013-06-09 MED ORDER — EPINEPHRINE HCL 1 MG/ML IJ SOLN
INTRAOCULAR | Status: DC | PRN
  Administered 2013-06-09: 09:00:00

## 2013-06-09 MED ORDER — FENTANYL CITRATE 0.05 MG/ML IJ SOLN: 25.0000 ug | INTRAMUSCULAR | Status: DC | PRN

## 2013-06-09 MED ORDER — MIDAZOLAM HCL 2 MG/2ML IJ SOLN
INTRAMUSCULAR | Status: AC
  Filled 2013-06-09: qty 2

## 2013-06-09 MED ORDER — LACTATED RINGERS IV SOLN
INTRAVENOUS | Status: DC
  Administered 2013-06-09: 09:00:00 via INTRAVENOUS

## 2013-06-09 MED ORDER — BSS IO SOLN
INTRAOCULAR | Status: DC | PRN
  Administered 2013-06-09: 15 mL via INTRAOCULAR

## 2013-06-09 MED ORDER — POVIDONE-IODINE 5 % OP SOLN
OPHTHALMIC | Status: DC | PRN
  Administered 2013-06-09: 1 via OPHTHALMIC

## 2013-06-09 SURGICAL SUPPLY — 33 items
CAPSULAR TENSION RING-AMO (OPHTHALMIC RELATED) IMPLANT
CLOTH BEACON ORANGE TIMEOUT ST (SAFETY) ×3 IMPLANT
EYE SHIELD UNIVERSAL CLEAR (GAUZE/BANDAGES/DRESSINGS) ×3 IMPLANT
GLOVE BIO SURGEON STRL SZ 6.5 (GLOVE) IMPLANT
GLOVE BIO SURGEONS STRL SZ 6.5 (GLOVE)
GLOVE BIOGEL PI IND STRL 6.5 (GLOVE) ×1 IMPLANT
GLOVE BIOGEL PI IND STRL 7.0 (GLOVE) ×2 IMPLANT
GLOVE BIOGEL PI IND STRL 7.5 (GLOVE) IMPLANT
GLOVE BIOGEL PI INDICATOR 6.5 (GLOVE) ×2
GLOVE BIOGEL PI INDICATOR 7.0 (GLOVE) ×4
GLOVE BIOGEL PI INDICATOR 7.5 (GLOVE)
GLOVE ECLIPSE 6.5 STRL STRAW (GLOVE) IMPLANT
GLOVE ECLIPSE 7.0 STRL STRAW (GLOVE) IMPLANT
GLOVE ECLIPSE 7.5 STRL STRAW (GLOVE) IMPLANT
GLOVE EXAM NITRILE LRG STRL (GLOVE) IMPLANT
GLOVE EXAM NITRILE MD LF STRL (GLOVE) IMPLANT
GLOVE SKINSENSE NS SZ6.5 (GLOVE)
GLOVE SKINSENSE NS SZ7.0 (GLOVE)
GLOVE SKINSENSE STRL SZ6.5 (GLOVE) IMPLANT
GLOVE SKINSENSE STRL SZ7.0 (GLOVE) IMPLANT
KIT VITRECTOMY (OPHTHALMIC RELATED) IMPLANT
PAD ARMBOARD 7.5X6 YLW CONV (MISCELLANEOUS) ×3 IMPLANT
PROC W NO LENS (INTRAOCULAR LENS)
PROC W SPEC LENS (INTRAOCULAR LENS)
PROCESS W NO LENS (INTRAOCULAR LENS) IMPLANT
PROCESS W SPEC LENS (INTRAOCULAR LENS) IMPLANT
RING MALYGIN (MISCELLANEOUS) IMPLANT
SIGHTPATH CAT PROC W REG LENS (Ophthalmic Related) ×3 IMPLANT
SYR TB 1ML LL NO SAFETY (SYRINGE) ×3 IMPLANT
TAPE SURG TRANSPORE 1 IN (GAUZE/BANDAGES/DRESSINGS) ×1 IMPLANT
TAPE SURGICAL TRANSPORE 1 IN (GAUZE/BANDAGES/DRESSINGS) ×2
VISCOELASTIC ADDITIONAL (OPHTHALMIC RELATED) IMPLANT
WATER STERILE IRR 250ML POUR (IV SOLUTION) ×3 IMPLANT

## 2013-06-09 NOTE — Op Note (Signed)
Date of Admission: 06/09/2013  Date of Surgery: 06/09/2013  Pre-Op Dx: Cataract  Right  Eye  Post-Op Dx: Combined Cataract  Right  Eye,  Dx Code 366.19  Surgeon: Tonny Branch, M.D.  Assistants: None  Anesthesia: Topical with MAC  Indications: Painless, progressive loss of vision with compromise of daily activities.  Surgery: Cataract Extraction with Intraocular lens Implant Right Eye  Discription: The patient had dilating drops and viscous lidocaine placed into the right eye in the pre-op holding area. After transfer to the operating room, a time out was performed. The patient was then prepped and draped. Beginning with a 19 degree blade a paracentesis port was made at the surgeon's 2 o'clock position. The anterior chamber was then filled with 1% non-preserved lidocaine. This was followed by filling the anterior chamber with Provisc.  A 2.16mm keratome blade was used to make a clear corneal incision at the temporal limbus.  A bent cystatome needle was used to create a continuous tear capsulotomy. Hydrodissection was performed with balanced salt solution on a Fine canula. The lens nucleus was then removed using the phacoemulsification handpiece. Residual cortex was removed with the I&A handpiece. The anterior chamber and capsular bag were refilled with Provisc. A posterior chamber intraocular lens was placed into the capsular bag with it's injector. The implant was positioned with the Kuglan hook. The Provisc was then removed from the anterior chamber and capsular bag with the I&A handpiece. Stromal hydration of the main incision and paracentesis port was performed with BSS on a Fine canula. The wounds were tested for leak which was negative. The patient tolerated the procedure well. There were no operative complications. The patient was then transferred to the recovery room in stable condition.  Complications: None  Specimen: None  EBL: None  Prosthetic device: B&L enVista, MX60, power 21.0D, SN  [id number].

## 2013-06-09 NOTE — Anesthesia Preprocedure Evaluation (Signed)
Anesthesia Evaluation  Patient identified by MRN, date of birth, ID band Patient awake    Reviewed: Allergy & Precautions, H&P , NPO status , Patient's Chart, lab work & pertinent test results  History of Anesthesia Complications (+) DIFFICULT AIRWAY and history of anesthetic complications (needs glidescope; has long neck, ant larynx)  Airway Mallampati: II TM Distance: >3 FB Neck ROM: Full   Comment: Glidescope with #4, easily visualized cords.  Unable to visualize with Mac3 or Mac4. Dental  (+) Partial Upper   Pulmonary neg pulmonary ROS, former smoker,    Pulmonary exam normal       Cardiovascular negative cardio ROS  Rhythm:Regular Rate:Normal     Neuro/Psych negative neurological ROS  negative psych ROS   GI/Hepatic negative GI ROS, Neg liver ROS,   Endo/Other  negative endocrine ROS  Renal/GU Renal disease (kidney stones)     Musculoskeletal negative musculoskeletal ROS (+)   Abdominal Normal abdominal exam  (+)   Peds  Hematology  (+) Blood dyscrasia, anemia ,   Anesthesia Other Findings Difficult airway...glidescope  Reproductive/Obstetrics                           Anesthesia Physical Anesthesia Plan  ASA: III  Anesthesia Plan: MAC   Post-op Pain Management:    Induction: Intravenous  Airway Management Planned: Nasal Cannula  Additional Equipment:   Intra-op Plan:   Post-operative Plan:   Informed Consent: I have reviewed the patients History and Physical, chart, labs and discussed the procedure including the risks, benefits and alternatives for the proposed anesthesia with the patient or authorized representative who has indicated his/her understanding and acceptance.     Plan Discussed with: CRNA  Anesthesia Plan Comments: (Hx difficult intubation....glidescope.)        Anesthesia Quick Evaluation

## 2013-06-09 NOTE — Anesthesia Procedure Notes (Signed)
Procedure Name: MAC Date/Time: 06/09/2013 9:28 AM Performed by: Andree Elk, AMY A Pre-anesthesia Checklist: Patient identified, Timeout performed, Emergency Drugs available, Suction available and Patient being monitored Oxygen Delivery Method: Nasal cannula

## 2013-06-09 NOTE — Anesthesia Postprocedure Evaluation (Signed)
  Anesthesia Post-op Note  Patient: William Burns  Procedure(s) Performed: Procedure(s): CATARACT EXTRACTION PHACO AND INTRAOCULAR LENS PLACEMENT (IOC) CDE=10.86 (Right)  Patient Location: Short Stay  Anesthesia Type:MAC  Level of Consciousness: awake, alert , oriented and patient cooperative  Airway and Oxygen Therapy: Patient Spontanous Breathing  Post-op Pain: none  Post-op Assessment: Post-op Vital signs reviewed, Patient's Cardiovascular Status Stable, Respiratory Function Stable, Patent Airway, No signs of Nausea or vomiting and Pain level controlled  Post-op Vital Signs: Reviewed  Complications: No apparent anesthesia complications

## 2013-06-09 NOTE — H&P (Signed)
I have reviewed the H&P, the patient was re-examined, and I have identified no interval changes in medical condition and plan of care since the history and physical of record  

## 2013-06-09 NOTE — Transfer of Care (Signed)
Immediate Anesthesia Transfer of Care Note  Patient: William Burns  Procedure(s) Performed: Procedure(s): CATARACT EXTRACTION PHACO AND INTRAOCULAR LENS PLACEMENT (IOC) CDE=10.86 (Right)  Patient Location: Short Stay  Anesthesia Type:MAC  Level of Consciousness: awake, alert , oriented and patient cooperative  Airway & Oxygen Therapy: Patient Spontanous Breathing  Post-op Assessment: Report given to PACU RN and Post -op Vital signs reviewed and stable  Post vital signs: Reviewed and stable  Complications: No apparent anesthesia complications

## 2013-06-09 NOTE — Preoperative (Signed)
Beta Blockers   Reason not to administer Beta Blockers:Not Applicable 

## 2013-06-13 ENCOUNTER — Encounter (HOSPITAL_COMMUNITY): Payer: Self-pay | Admitting: Ophthalmology

## 2013-07-04 ENCOUNTER — Ambulatory Visit (INDEPENDENT_AMBULATORY_CARE_PROVIDER_SITE_OTHER): Payer: Medicare Other | Admitting: Internal Medicine

## 2013-07-05 ENCOUNTER — Encounter (INDEPENDENT_AMBULATORY_CARE_PROVIDER_SITE_OTHER): Payer: Self-pay | Admitting: Internal Medicine

## 2013-07-05 ENCOUNTER — Ambulatory Visit (INDEPENDENT_AMBULATORY_CARE_PROVIDER_SITE_OTHER): Payer: Medicare Other | Admitting: Internal Medicine

## 2013-07-05 VITALS — BP 156/84 | HR 60 | Temp 98.4°F | Ht 75.0 in | Wt 242.7 lb

## 2013-07-05 DIAGNOSIS — R197 Diarrhea, unspecified: Secondary | ICD-10-CM | POA: Insufficient documentation

## 2013-07-05 NOTE — Patient Instructions (Signed)
Continue present meds. Probably had a virus. Stools guaiac negative. PET scan next month

## 2013-07-05 NOTE — Progress Notes (Signed)
Subjective:     Patient ID: William Burns, male   DOB: Aug 29, 1945, 68 y.o.   MRN: [id number]  HPI  Last seen in September of 2014.  Presents today with c/o that for 1 1/2 weeks or more, he has had diarrhea.He was having 2-3 stools a day and were watery. No fever. He also had pain left mid abdomen x 2.  The pain last about 3-4 minutes. The second time the pain occurred it last about 30 minutes.  No problems in 3 days.   He took 2 Imodium and he he stopped the soft drinks. Diarrhea resolved. No problems now. He had eaten at [employer] just before the diarrhea started.   He also had a salad. He denies melena or bright red rectal bleeding.  Appetite is good. No weight loss. BMs are normal now. No melena or bright red rectal bleeding.  No recent antibiotics.   In 2013 he presented with progressive weakness and a hemoglobin of 7.8. He was found to have a short segment Barrett's esophagus and cecal adenocarcinoma. He underwent a rt hemicolectomy by Dr. Arnoldo Morale. He was evaluated by Dr. Tressie Stalker who recommended no chemotherapy. He is follow by the oncology clinic and his CEA levels have remained normal.   CBC    Component Value Date/Time   WBC 7.8 08/28/2012 1700   RBC 5.07 08/28/2012 1700   RBC 3.26* 07/01/2011 2229   HGB 14.5 06/01/2013 1255   HCT 43.1 06/01/2013 1255   PLT 242 08/28/2012 1700   MCV 86.6 08/28/2012 1700   MCH 29.6 08/28/2012 1700   MCHC 34.2 08/28/2012 1700   RDW 13.3 08/28/2012 1700   LYMPHSABS 1.5 07/20/2011 0722   MONOABS 0.5 07/20/2011 0722   EOSABS 0.6 07/20/2011 0722   BASOSABS 0.1 07/20/2011 0722      09/16/2012 Colonoscopy: Colon Withdrawal Time: 12 minutes  Impression:  Examination performed to ileocolonic anastomosis located in the region of hepatic flexure.  Few small diverticula at sigmoid colon.  5 mm polyp cold snared from rectosigmoid.  Biopsy: serrated adenoma.  Next colonoscopy 3 yrs. (2017).     Review of Systems see HPI Past Medical History  Diagnosis Date   . BPH (benign prostatic hyperplasia)   . Kidney stones 2011  . Tooth infection 07/03/2011  . Colonic mass 07/03/2011  . Erosive gastritis 07/03/2011  . Renal calculus   . Difficult intubation     wife states they were told he had to have an "extra long tube" for intubation, had trouble with this when he had colon resection 07-04-11 Dr Addison Naegeli  . Cancer     colon  . Cataract    Past Surgical History  Procedure Laterality Date  . Hernia repair    . Prostate biopsy    . Esophagogastroduodenoscopy  07/03/2011    Procedure: ESOPHAGOGASTRODUODENOSCOPY (EGD);  Surgeon: Rogene Houston, MD;  Location: [facility];  Service: Endoscopy;  Laterality: N/A;  . Colonoscopy  07/03/2011    Procedure: COLONOSCOPY;  Surgeon: Rogene Houston, MD;  Location: [facility];  Service: Endoscopy;  Laterality: N/A;  . Partial colectomy  07/04/2011    Procedure: PARTIAL COLECTOMY;  Surgeon: Jamesetta So, MD;  Location: [facility];  Service: General;  Laterality: N/A;  . Cystoscopy  07/09/2011    Procedure: Erlene Quan;  Surgeon: Marissa Nestle, MD;  Location: [facility];  Service: Urology;  Laterality: N/A;  . Colon surgery    . Transurethral resection of prostate  07/11/2011  Procedure: TRANSURETHRAL RESECTION OF THE PROSTATE (TURP);  Surgeon: Marissa Nestle, MD;  Location: [facility];  Service: Urology;  Laterality: N/A;  . Cystoscopy with litholapaxy  07/11/2011    Procedure: CYSTOSCOPY WITH LITHOLAPAXY;  Surgeon: Marissa Nestle, MD;  Location: [facility];  Service: Urology;  Laterality: N/A;  . Cataract extraction w/phaco Left 08/23/2012    Procedure: CATARACT EXTRACTION PHACO AND INTRAOCULAR LENS PLACEMENT (IOC);  Surgeon: Tonny Branch, MD;  Location: [facility];  Service: Ophthalmology;  Laterality: Left;  CDE 16.64  . Colonoscopy N/A 09/16/2012    Procedure: COLONOSCOPY;  Surgeon: Rogene Houston, MD;  Location: [facility];  Service: Endoscopy;  Laterality: N/A;  930  . Cataract extraction w/phaco Right  06/09/2013    Procedure: CATARACT EXTRACTION PHACO AND INTRAOCULAR LENS PLACEMENT (IOC) CDE=10.86;  Surgeon: Tonny Branch, MD;  Location: [facility];  Service: Ophthalmology;  Laterality: Right;   No Known Allergies      Objective:   Physical Exam  Filed Vitals:   07/05/13 0930  BP: 156/84  Pulse: 60  Temp: 98.4 F (36.9 C)  Height: 6\' 3"  (1.905 m)  Weight: 242 lb 11.2 oz (110.088 kg)  Alert and oriented. Skin warm and dry. Oral mucosa is moist.   . Sclera anicteric, conjunctivae is pink. Thyroid not enlarged. No cervical lymphadenopathy. Lungs clear. Heart regular rate and rhythm.  Abdomen is soft. Bowel sounds are positive. No hepatomegaly. No abdominal masses felt. No tenderness.  No edema to lower extremities. Stool brown and guaiac negative.      Assessment:    Diarrhea resolved now. Probably viral in nature. No recent antibiotics.     Plan:     Continue present medications. If diarrhea reoccurs or you have abdominal pain, call our office. Will get a CT abdomen/pelvis with CM. Patient is scheduled for a PET scan next months.

## 2013-07-14 DIAGNOSIS — L678 Other hair color and hair shaft abnormalities: Secondary | ICD-10-CM | POA: Diagnosis not present

## 2013-07-14 DIAGNOSIS — L570 Actinic keratosis: Secondary | ICD-10-CM | POA: Diagnosis not present

## 2013-07-14 DIAGNOSIS — L738 Other specified follicular disorders: Secondary | ICD-10-CM | POA: Diagnosis not present

## 2013-07-14 DIAGNOSIS — D235 Other benign neoplasm of skin of trunk: Secondary | ICD-10-CM | POA: Diagnosis not present

## 2013-07-28 ENCOUNTER — Other Ambulatory Visit (HOSPITAL_COMMUNITY): Payer: Self-pay

## 2013-07-28 ENCOUNTER — Other Ambulatory Visit (HOSPITAL_COMMUNITY): Payer: Medicare Other

## 2013-07-28 DIAGNOSIS — D509 Iron deficiency anemia, unspecified: Secondary | ICD-10-CM

## 2013-07-28 DIAGNOSIS — C189 Malignant neoplasm of colon, unspecified: Secondary | ICD-10-CM

## 2013-07-29 ENCOUNTER — Ambulatory Visit (HOSPITAL_COMMUNITY): Payer: Medicare Other

## 2013-07-29 ENCOUNTER — Ambulatory Visit (HOSPITAL_COMMUNITY)
Admission: RE | Admit: 2013-07-29 | Discharge: 2013-07-29 | Disposition: A | Discharge status: Home / Self Care | Payer: Medicare Other | Source: Ambulatory Visit | Attending: Hematology and Oncology | Admitting: Hematology and Oncology

## 2013-07-29 ENCOUNTER — Encounter (HOSPITAL_COMMUNITY): Payer: Medicare Other | Attending: Oncology

## 2013-07-29 DIAGNOSIS — C189 Malignant neoplasm of colon, unspecified: Secondary | ICD-10-CM | POA: Insufficient documentation

## 2013-07-29 DIAGNOSIS — N200 Calculus of kidney: Secondary | ICD-10-CM | POA: Insufficient documentation

## 2013-07-29 DIAGNOSIS — C180 Malignant neoplasm of cecum: Secondary | ICD-10-CM | POA: Diagnosis not present

## 2013-07-29 DIAGNOSIS — D509 Iron deficiency anemia, unspecified: Secondary | ICD-10-CM | POA: Insufficient documentation

## 2013-07-29 LAB — CBC WITH DIFFERENTIAL/PLATELET
Basophils Absolute: 0.1 10*3/uL (ref 0.0–0.1)
Basophils Relative: 1 % (ref 0–1)
EOS ABS: 0.3 10*3/uL (ref 0.0–0.7)
Eosinophils Relative: 5 % (ref 0–5)
HEMATOCRIT: 45 % (ref 39.0–52.0)
HEMOGLOBIN: 15.5 g/dL (ref 13.0–17.0)
Lymphocytes Relative: 29 % (ref 12–46)
Lymphs Abs: 1.8 10*3/uL (ref 0.7–4.0)
MCH: 30.2 pg (ref 26.0–34.0)
MCHC: 34.4 g/dL (ref 30.0–36.0)
MCV: 87.5 fL (ref 78.0–100.0)
MONO ABS: 0.5 10*3/uL (ref 0.1–1.0)
MONOS PCT: 8 % (ref 3–12)
NEUTROS PCT: 58 % (ref 43–77)
Neutro Abs: 3.6 10*3/uL (ref 1.7–7.7)
Platelets: 248 10*3/uL (ref 150–400)
RBC: 5.14 MIL/uL (ref 4.22–5.81)
RDW: 13.1 % (ref 11.5–15.5)
WBC: 6.2 10*3/uL (ref 4.0–10.5)

## 2013-07-29 LAB — FERRITIN: Ferritin: 46 ng/mL (ref 22–322)

## 2013-07-29 LAB — COMPREHENSIVE METABOLIC PANEL
ALT: 31 U/L (ref 0–53)
AST: 40 U/L — ABNORMAL HIGH (ref 0–37)
Albumin: 3.8 g/dL (ref 3.5–5.2)
Alkaline Phosphatase: 72 U/L (ref 39–117)
BUN: 13 mg/dL (ref 6–23)
CO2: 29 mEq/L (ref 19–32)
CREATININE: 0.92 mg/dL (ref 0.50–1.35)
Calcium: 9.5 mg/dL (ref 8.4–10.5)
Chloride: 101 mEq/L (ref 96–112)
GFR calc non Af Amer: 85 mL/min — ABNORMAL LOW (ref >90)
GLUCOSE: 101 mg/dL — AB (ref 70–99)
POTASSIUM: 4.4 meq/L (ref 3.7–5.3)
Sodium: 140 mEq/L (ref 137–147)
TOTAL PROTEIN: 7.7 g/dL (ref 6.0–8.3)
Total Bilirubin: 0.5 mg/dL (ref 0.3–1.2)

## 2013-07-29 LAB — IRON AND TIBC
Iron: 111 ug/dL (ref 42–135)
Saturation Ratios: 33 % (ref 20–55)
TIBC: 339 ug/dL (ref 215–435)
UIBC: 228 ug/dL (ref 125–400)

## 2013-07-29 LAB — CEA: CEA: 1.7 ng/mL (ref 0.0–5.0)

## 2013-07-29 MED ORDER — IOHEXOL 300 MG/ML  SOLN
100.0000 mL | Freq: Once | INTRAMUSCULAR | Status: AC | PRN
  Administered 2013-07-29: 100 mL via INTRAVENOUS

## 2013-07-29 NOTE — Progress Notes (Signed)
Labs drawn today for cbc/diff,cmp,cea,ferr, Iron and IBC

## 2013-08-02 ENCOUNTER — Encounter (HOSPITAL_COMMUNITY): Payer: Medicare Other | Attending: Oncology

## 2013-08-02 ENCOUNTER — Encounter (HOSPITAL_COMMUNITY): Payer: Self-pay

## 2013-08-02 VITALS — BP 147/73 | HR 66 | Temp 97.8°F | Resp 18 | Wt 246.0 lb

## 2013-08-02 DIAGNOSIS — K2270 Barrett's esophagus without dysplasia: Secondary | ICD-10-CM

## 2013-08-02 DIAGNOSIS — N200 Calculus of kidney: Secondary | ICD-10-CM | POA: Diagnosis not present

## 2013-08-02 DIAGNOSIS — C180 Malignant neoplasm of cecum: Secondary | ICD-10-CM | POA: Diagnosis not present

## 2013-08-02 DIAGNOSIS — D509 Iron deficiency anemia, unspecified: Secondary | ICD-10-CM | POA: Diagnosis not present

## 2013-08-02 DIAGNOSIS — C189 Malignant neoplasm of colon, unspecified: Secondary | ICD-10-CM | POA: Insufficient documentation

## 2013-08-02 DIAGNOSIS — K9089 Other intestinal malabsorption: Secondary | ICD-10-CM | POA: Insufficient documentation

## 2013-08-02 DIAGNOSIS — K909 Intestinal malabsorption, unspecified: Secondary | ICD-10-CM

## 2013-08-02 NOTE — Progress Notes (Signed)
William Burns  OFFICE PROGRESS NOTE  [place], [facility], MD  [place] [address]  DIAGNOSIS: Adenocarcinoma of colon - Plan: CBC with Differential, Comprehensive metabolic panel, CEA  Kidney stone on left side  Malabsorption of iron - Plan: CBC with Differential, Ferritin  Chief Complaint  Patient presents with  . Colon Cancer     CURRENT THERAPY: Watchful expectation and surveillance.  INTERVAL HISTORY: William Burns 68 y.o. male returns for followup of stage II-a carcinoma the cecum, status post right hemicolectomy on 07/07/2011 with no postop therapy. Last colonoscopy was in April of 2014 at which time a sessile adenoma was found with out high-grade dysplasia or malignancy. He continues to do well with no fever, night sweats, abdominal pain, melena, hematochezia, hematuria, cough, wheezing, abdominal distention, lower extremity swelling or redness, but he did have some discomfort involving the left flank a number of months ago. He denies any dysuria, urinary hesitancy, nocturia, or incontinence.  MEDICAL HISTORY: Past Medical History  Diagnosis Date  . BPH (benign prostatic hyperplasia)   . Kidney stones 2011  . Tooth infection 07/03/2011  . Colonic mass 07/03/2011  . Erosive gastritis 07/03/2011  . Renal calculus   . Difficult intubation     wife states they were told he had to have an "extra long tube" for intubation, had trouble with this when he had colon resection 07-04-11 Dr Addison Naegeli  . Cancer     colon  . Cataract     INTERIM HISTORY: has Microcytic hypochromic anemia; Hx of colonic polyp; Hyperglycemia; BPH (benign prostatic hyperplasia); Tooth infection; Adenocarcinoma of colon; Erosive gastritis; Calculus, bladder; Short-segment Barrett's esophagus; Diarrhea; and Kidney stone on left side on his problem list.   Stage IIA (T3 N0) adenocarcinoma of the colon occurring in the cecum which was felt to be a grade 2 tumor,  8.5 cm, with  pericolonic connective tissue involvement but all margins negative. No lymphovascular invasion. Twenty-eight lymph nodes were  examined, all of which were negative for metastatic disease. He is status post surgery on 07/07/2011. No adjuvant chemotherapy.    ALLERGIES:  has No Known Allergies.  MEDICATIONS: has a current medication list which includes the following prescription(s): fish oil-omega-3 fatty acids, multivitamin with minerals, and pantoprazole.  SURGICAL HISTORY:  Past Surgical History  Procedure Laterality Date  . Hernia repair    . Prostate biopsy    . Esophagogastroduodenoscopy  07/03/2011    Procedure: ESOPHAGOGASTRODUODENOSCOPY (EGD);  Surgeon: Rogene Houston, MD;  Location: [facility];  Service: Endoscopy;  Laterality: N/A;  . Colonoscopy  07/03/2011    Procedure: COLONOSCOPY;  Surgeon: Rogene Houston, MD;  Location: [facility];  Service: Endoscopy;  Laterality: N/A;  . Partial colectomy  07/04/2011    Procedure: PARTIAL COLECTOMY;  Surgeon: Jamesetta So, MD;  Location: [facility];  Service: General;  Laterality: N/A;  . Cystoscopy  07/09/2011    Procedure: Erlene Quan;  Surgeon: Marissa Nestle, MD;  Location: [facility];  Service: Urology;  Laterality: N/A;  . Colon surgery    . Transurethral resection of prostate  07/11/2011    Procedure: TRANSURETHRAL RESECTION OF THE PROSTATE (TURP);  Surgeon: Marissa Nestle, MD;  Location: [facility];  Service: Urology;  Laterality: N/A;  . Cystoscopy with litholapaxy  07/11/2011    Procedure: CYSTOSCOPY WITH LITHOLAPAXY;  Surgeon: Marissa Nestle, MD;  Location: [facility];  Service: Urology;  Laterality: N/A;  . Cataract extraction w/phaco Left 08/23/2012    Procedure: CATARACT EXTRACTION PHACO AND INTRAOCULAR LENS PLACEMENT (IOC);  Surgeon: Tonny Branch, MD;  Location: [facility];  Service: Ophthalmology;  Laterality: Left;  CDE 16.64  . Colonoscopy N/A 09/16/2012    Procedure: COLONOSCOPY;  Surgeon: Rogene Houston, MD;   [facility]: [facility];  Service: Endoscopy;  Laterality: N/A;  930  . Cataract extraction w/phaco Right 06/09/2013    Procedure: CATARACT EXTRACTION PHACO AND INTRAOCULAR LENS PLACEMENT (IOC) CDE=10.86;  Surgeon: Tonny Branch, MD;  Location: [facility];  Service: Ophthalmology;  Laterality: Right;    FAMILY HISTORY: family history includes Alzheimer's disease in his father and mother; Aneurysm in his brother; Anuerysm in his brother; Healthy in his son and son. There is no history of Colon cancer.  SOCIAL HISTORY:  reports that he quit smoking about 24 years ago. His smoking use included Cigarettes. He has a 50 pack-year smoking history. He has never used smokeless tobacco. He reports that he does not drink alcohol or use illicit drugs.  REVIEW OF SYSTEMS:  Other than that discussed above is noncontributory.  PHYSICAL EXAMINATION: ECOG PERFORMANCE STATUS: 0 - Asymptomatic  Blood pressure 147/73, pulse 66, temperature 97.8 F (36.6 C), temperature source Oral, resp. rate 18, weight 246 lb (111.585 kg), SpO2 98.00%.  GENERAL:alert, no distress and comfortable SKIN: skin color, texture, turgor are normal, no rashes or significant lesions EYES: PERLA; Conjunctiva are pink and non-injected, sclera clear OROPHARYNX:no exudate, no erythema on lips, buccal mucosa, or tongue. NECK: supple, thyroid normal size, non-tender, without nodularity. No masses CHEST: Increased AP diameter with no gynecomastia. LYMPH:  no palpable lymphadenopathy in the cervical, axillary or inguinal LUNGS: clear to auscultation and percussion with normal breathing effort HEART: regular rate & rhythm and no murmurs. ABDOMEN:abdomen soft, non-tender and normal bowel sounds. No hepatosplenomegaly, ascites, or CVA tenderness. MUSCULOSKELETAL:no cyanosis of digits and no clubbing. Range of motion normal.  NEURO: alert & oriented x 3 with fluent speech, no focal motor/sensory deficits   LABORATORY DATA: Infusion on 07/29/2013    Component Date Value Ref Range Status  . WBC 07/29/2013 6.2  4.0 - 10.5 K/uL Final  . RBC 07/29/2013 5.14  4.22 - 5.81 MIL/uL Final  . Hemoglobin 07/29/2013 15.5  13.0 - 17.0 g/dL Final  . HCT 07/29/2013 45.0  39.0 - 52.0 % Final  . MCV 07/29/2013 87.5  78.0 - 100.0 fL Final  . MCH 07/29/2013 30.2  26.0 - 34.0 pg Final  . MCHC 07/29/2013 34.4  30.0 - 36.0 g/dL Final  . RDW 07/29/2013 13.1  11.5 - 15.5 % Final  . Platelets 07/29/2013 248  150 - 400 K/uL Final  . Neutrophils Relative % 07/29/2013 58  43 - 77 % Final  . Neutro Abs 07/29/2013 3.6  1.7 - 7.7 K/uL Final  . Lymphocytes Relative 07/29/2013 29  12 - 46 % Final  . Lymphs Abs 07/29/2013 1.8  0.7 - 4.0 K/uL Final  . Monocytes Relative 07/29/2013 8  3 - 12 % Final  . Monocytes Absolute 07/29/2013 0.5  0.1 - 1.0 K/uL Final  . Eosinophils Relative 07/29/2013 5  0 - 5 % Final  . Eosinophils Absolute 07/29/2013 0.3  0.0 - 0.7 K/uL Final  . Basophils Relative 07/29/2013 1  0 - 1 % Final  . Basophils Absolute 07/29/2013 0.1  0.0 - 0.1 K/uL Final  . Sodium 07/29/2013 140  137 - 147 mEq/L Final  . Potassium 07/29/2013  4.4  3.7 - 5.3 mEq/L Final  . Chloride 07/29/2013 101  96 - 112 mEq/L Final  . CO2 07/29/2013 29  19 - 32 mEq/L Final  . Glucose, Bld 07/29/2013 101* 70 - 99 mg/dL Final  . BUN 07/29/2013 13  6 - 23 mg/dL Final  . Creatinine, Ser 07/29/2013 0.92  0.50 - 1.35 mg/dL Final  . Calcium 07/29/2013 9.5  8.4 - 10.5 mg/dL Final  . Total Protein 07/29/2013 7.7  6.0 - 8.3 g/dL Final  . Albumin 07/29/2013 3.8  3.5 - 5.2 g/dL Final  . AST 07/29/2013 40* 0 - 37 U/L Final  . ALT 07/29/2013 31  0 - 53 U/L Final  . Alkaline Phosphatase 07/29/2013 72  39 - 117 U/L Final  . Total Bilirubin 07/29/2013 0.5  0.3 - 1.2 mg/dL Final  . GFR calc non Af Amer 07/29/2013 85* >90 mL/min Final  . GFR calc Af Amer 07/29/2013 >90  >90 mL/min Final   Comment: (NOTE)                          The eGFR has been calculated using the CKD EPI equation.                           This calculation has not been validated in all clinical situations.                          eGFR's persistently <90 mL/min signify possible Chronic Kidney                          Disease.  . CEA 07/29/2013 1.7  0.0 - 5.0 ng/mL Final   Performed at [facility]  . Ferritin 07/29/2013 46  22 - 322 ng/mL Final   Performed at [facility]  . Iron 07/29/2013 111  42 - 135 ug/dL Final  . TIBC 07/29/2013 339  215 - 435 ug/dL Final  . Saturation Ratios 07/29/2013 33  20 - 55 % Final  . UIBC 07/29/2013 228  125 - 400 ug/dL Final   Performed at [facility]: Status of microsatellite stability unknown.  Urinalysis    Component Value Date/Time   COLORURINE YELLOW 08/28/2012 1859   APPEARANCEUR CLEAR 08/28/2012 1859   LABSPEC 1.010 08/28/2012 1859   PHURINE 6.5 08/28/2012 1859   GLUCOSEU NEGATIVE 08/28/2012 1859   HGBUR NEGATIVE 08/28/2012 1859   BILIRUBINUR NEGATIVE 08/28/2012 1859   KETONESUR NEGATIVE 08/28/2012 1859   PROTEINUR NEGATIVE 08/28/2012 1859   UROBILINOGEN 0.2 08/28/2012 1859   NITRITE NEGATIVE 08/28/2012 1859   LEUKOCYTESUR TRACE* 08/28/2012 1859    RADIOGRAPHIC STUDIES: Ct Chest W Contrast  07/29/2013   CLINICAL DATA:  Colon cancer  EXAM: CT CHEST, ABDOMEN, AND PELVIS WITH CONTRAST  TECHNIQUE: Multidetector CT imaging of the chest, abdomen and pelvis was performed following the standard protocol during bolus administration of intravenous contrast.  CONTRAST:  148m OMNIPAQUE IOHEXOL 300 MG/ML  SOLN  COMPARISON:  None.  FINDINGS: CT CHEST FINDINGS  The heart size appears normal. There is no pleural or pericardial effusion identified. No enlarged mediastinal or hilar lymph nodes identified. There is no axillary or supraclavicular adenopathy.  1.2 cm low attenuation structure is identified within the right lobe of thyroid gland, image 6/ series 2.  No pleural effusion identified. No  suspicious pulmonary nodule or mass identified.   Spondylosis noted within the thoracic spine. No aggressive lytic or sclerotic bone lesions.  CT ABDOMEN AND PELVIS FINDINGS  There is no focal liver abnormality identified. The gallbladder appears normal. No biliary dilatation. Normal appearance of the pancreas. The spleen is on unremarkable.  The adrenal glands are both normal. Normal appearance of the right kidney. Cyst is noted arising from the upper pole of the left kidney measuring 1.8 cm, image 65/ series 2. There is a stone within the upper pole of the left kidney which measures 5 mm, image 73/series 2.  The urinary bladder appears normal. There is prostate gland enlargement.  Calcified atherosclerotic disease affects the abdominal aorta. There is no aneurysm. No upper abdominal adenopathy. No pelvic or inguinal adenopathy noted.  The stomach is normal. The small bowel loops have a normal course and caliber. No obstruction. Normal appearance of the colon.  Spondylosis is noted within the lumbar spine. There is a left-sided L5 pars defect.  IMPRESSION: 1. No acute findings. 2. No evidence for metastatic disease within the chest abdomen or pelvis. 3. Left renal stone 4. Left L5 pars defect.   Electronically Signed   By: Kerby Moors M.D.   On: 07/29/2013 09:49   Ct Abdomen Pelvis W Contrast  07/29/2013   CLINICAL DATA:  Colon cancer  EXAM: CT CHEST, ABDOMEN, AND PELVIS WITH CONTRAST  TECHNIQUE: Multidetector CT imaging of the chest, abdomen and pelvis was performed following the standard protocol during bolus administration of intravenous contrast.  CONTRAST:  139m OMNIPAQUE IOHEXOL 300 MG/ML  SOLN  COMPARISON:  None.  FINDINGS: CT CHEST FINDINGS  The heart size appears normal. There is no pleural or pericardial effusion identified. No enlarged mediastinal or hilar lymph nodes identified. There is no axillary or supraclavicular adenopathy.  1.2 cm low attenuation structure is identified within the right lobe of thyroid gland, image 6/ series 2.  No  pleural effusion identified. No suspicious pulmonary nodule or mass identified.  Spondylosis noted within the thoracic spine. No aggressive lytic or sclerotic bone lesions.  CT ABDOMEN AND PELVIS FINDINGS  There is no focal liver abnormality identified. The gallbladder appears normal. No biliary dilatation. Normal appearance of the pancreas. The spleen is on unremarkable.  The adrenal glands are both normal. Normal appearance of the right kidney. Cyst is noted arising from the upper pole of the left kidney measuring 1.8 cm, image 65/ series 2. There is a stone within the upper pole of the left kidney which measures 5 mm, image 73/series 2.  The urinary bladder appears normal. There is prostate gland enlargement.  Calcified atherosclerotic disease affects the abdominal aorta. There is no aneurysm. No upper abdominal adenopathy. No pelvic or inguinal adenopathy noted.  The stomach is normal. The small bowel loops have a normal course and caliber. No obstruction. Normal appearance of the colon.  Spondylosis is noted within the lumbar spine. There is a left-sided L5 pars defect.  IMPRESSION: 1. No acute findings. 2. No evidence for metastatic disease within the chest abdomen or pelvis. 3. Left renal stone 4. Left L5 pars defect.   Electronically Signed   By: TKerby MoorsM.D.   On: 07/29/2013 09:49    ASSESSMENT:  #1. Stage II-A adenocarcinoma of the cecum, satus post resection with surgery in 07/07/2011, no postop therapy, no evidence of disease. #2. Left renal stone, asymptomatic at this time. History of kidney stones in the bladder in 2013. #3. Barrett's esophagus, on  pantoprazole. #4. Iron deficiency without anemia, probably due to malabsorption of iron.   PLAN:  #1. IV Feraheme on 08/10/2013. #2. Office visit in 6 months with CBC, chem profile, CEA, and ferritin.   All questions were answered. The patient knows to call the clinic with any problems, questions or concerns. We can certainly see the  patient much sooner if necessary.   I spent 25 minutes counseling the patient face to face. The total time spent in the appointment was 30 minutes.    Doroteo Bradford, MD 08/02/2013 2:15 PM

## 2013-08-02 NOTE — Patient Instructions (Signed)
[facility] Discharge Instructions  RECOMMENDATIONS MADE BY THE CONSULTANT AND ANY TEST RESULTS WILL BE SENT TO YOUR REFERRING PHYSICIAN.  EXAM FINDINGS BY THE PHYSICIAN TODAY AND SIGNS OR SYMPTOMS TO REPORT TO CLINIC OR PRIMARY PHYSICIAN: Exam and findings as discussed by Dr. Barnet Glasgow.  Labs and xrays were good.  Will give you an iron infusion as your iron stores are a little low.  Report any changes in your bowel habits, blood in your bowel movement, etc.  MEDICATIONS PRESCRIBED:  none  INSTRUCTIONS/FOLLOW-UP: Feraheme infusion on 3/11 and follow-up in 6 months with blood work and office visit.  Thank you for choosing [facility] to provide your oncology and hematology care.  To afford each patient quality time with our providers, please arrive at least 15 minutes before your scheduled appointment time.  With your help, our goal is to use those 15 minutes to complete the necessary work-up to ensure our physicians have the information they need to help with your evaluation and healthcare recommendations.    Effective January 1st, 2014, we ask that you re-schedule your appointment with our physicians should you arrive 10 or more minutes late for your appointment.  We strive to give you quality time with our providers, and arriving late affects you and other patients whose appointments are after yours.    Again, thank you for choosing [facility].  Our hope is that these requests will decrease the amount of time that you wait before being seen by our physicians.       _____________________________________________________________  Should you have questions after your visit to [facility], please contact our office at [phone] between the hours of 8:30 a.m. and 5:00 p.m.  Voicemails left after 4:30 p.m. will not be returned until the following business day.  For prescription refill requests, have your pharmacy contact our office with  your prescription refill request.     Ferumoxytol injection What is this medicine? FERUMOXYTOL is an iron complex. Iron is used to make healthy red blood cells, which carry oxygen and nutrients throughout the body. This medicine is used to treat iron deficiency anemia in people with chronic kidney disease. This medicine may be used for other purposes; ask your health care provider or pharmacist if you have questions. COMMON BRAND NAME(S): Feraheme  What should I tell my health care provider before I take this medicine? They need to know if you have any of these conditions: -anemia not caused by low iron levels -high levels of iron in the blood -magnetic resonance imaging (MRI) test scheduled -an unusual or allergic reaction to iron, other medicines, foods, dyes, or preservatives -pregnant or trying to get pregnant -breast-feeding How should I use this medicine? This medicine is for injection into a vein. It is given by a health care professional in a hospital or clinic setting. Talk to your pediatrician regarding the use of this medicine in children. Special care may be needed. Overdosage: If you think you've taken too much of this medicine contact a poison control center or emergency room at once. Overdosage: If you think you have taken too much of this medicine contact a poison control center or emergency room at once. NOTE: This medicine is only for you. Do not share this medicine with others. What if I miss a dose? It is important not to miss your dose. Call your doctor or health care professional if you are unable to keep an appointment. What may  interact with this medicine? This medicine may interact with the following medications: -other iron products This list may not describe all possible interactions. Give your health care provider a list of all the medicines, herbs, non-prescription drugs, or dietary supplements you use. Also tell them if you smoke, drink alcohol, or use illegal  drugs. Some items may interact with your medicine. What should I watch for while using this medicine? Visit your doctor or healthcare professional regularly. Tell your doctor or healthcare professional if your symptoms do not start to get better or if they get worse. You may need blood work done while you are taking this medicine. You may need to follow a special diet. Talk to your doctor. Foods that contain iron include: whole grains/cereals, dried fruits, beans, or peas, leafy green vegetables, and organ meats (liver, kidney). What side effects may I notice from receiving this medicine? Side effects that you should report to your doctor or health care professional as soon as possible: -allergic reactions like skin rash, itching or hives, swelling of the face, lips, or tongue -breathing problems -changes in blood pressure -feeling faint or lightheaded, falls -fever or chills -flushing, sweating, or hot feelings -swelling of the ankles or feet Side effects that usually do not require medical attention (Report these to your doctor or health care professional if they continue or are bothersome.): -diarrhea -headache -nausea, vomiting -stomach pain This list may not describe all possible side effects. Call your doctor for medical advice about side effects. You may report side effects to FDA at 1-800-FDA-1088. Where should I keep my medicine? This drug is given in a hospital or clinic and will not be stored at home. NOTE: This sheet is a summary. It may not cover all possible information. If you have questions about this medicine, talk to your doctor, pharmacist, or health care provider.  2014, Elsevier/Gold Standard. (2012-01-02 15:23:36)

## 2013-08-10 ENCOUNTER — Encounter (HOSPITAL_BASED_OUTPATIENT_CLINIC_OR_DEPARTMENT_OTHER): Payer: Medicare Other

## 2013-08-10 VITALS — BP 117/69 | HR 77 | Temp 98.1°F | Resp 18

## 2013-08-10 DIAGNOSIS — D508 Other iron deficiency anemias: Secondary | ICD-10-CM

## 2013-08-10 DIAGNOSIS — K9089 Other intestinal malabsorption: Secondary | ICD-10-CM

## 2013-08-10 DIAGNOSIS — N200 Calculus of kidney: Secondary | ICD-10-CM

## 2013-08-10 DIAGNOSIS — D509 Iron deficiency anemia, unspecified: Secondary | ICD-10-CM

## 2013-08-10 MED ORDER — FERUMOXYTOL INJECTION 510 MG/17 ML
1020.0000 mg | Freq: Once | INTRAVENOUS | Status: AC
  Administered 2013-08-10: 1020 mg via INTRAVENOUS
  Filled 2013-08-10: qty 34

## 2013-08-10 MED ORDER — SODIUM CHLORIDE 0.9 % IJ SOLN: 10.0000 mL | INTRAMUSCULAR | Status: DC | PRN

## 2013-08-10 MED ORDER — SODIUM CHLORIDE 0.9 % IV SOLN
Freq: Once | INTRAVENOUS | Status: AC
  Administered 2013-08-10: 11:00:00 via INTRAVENOUS

## 2013-08-10 NOTE — Progress Notes (Signed)
..  [name] tolerated iron infusion without problems

## 2013-09-29 ENCOUNTER — Emergency Department (HOSPITAL_COMMUNITY)
Admission: EM | Admit: 2013-09-29 | Discharge: 2013-09-29 | Disposition: A | Discharge status: Home / Self Care | Payer: Medicare Other | Attending: Emergency Medicine | Admitting: Emergency Medicine

## 2013-09-29 ENCOUNTER — Emergency Department (HOSPITAL_COMMUNITY): Payer: Medicare Other

## 2013-09-29 ENCOUNTER — Encounter (HOSPITAL_COMMUNITY): Payer: Self-pay | Admitting: Emergency Medicine

## 2013-09-29 DIAGNOSIS — R300 Dysuria: Secondary | ICD-10-CM | POA: Diagnosis not present

## 2013-09-29 DIAGNOSIS — Z8669 Personal history of other diseases of the nervous system and sense organs: Secondary | ICD-10-CM | POA: Diagnosis not present

## 2013-09-29 DIAGNOSIS — K2960 Other gastritis without bleeding: Secondary | ICD-10-CM | POA: Diagnosis not present

## 2013-09-29 DIAGNOSIS — Z87891 Personal history of nicotine dependence: Secondary | ICD-10-CM | POA: Insufficient documentation

## 2013-09-29 DIAGNOSIS — Z85038 Personal history of other malignant neoplasm of large intestine: Secondary | ICD-10-CM | POA: Diagnosis not present

## 2013-09-29 DIAGNOSIS — N200 Calculus of kidney: Secondary | ICD-10-CM | POA: Diagnosis not present

## 2013-09-29 DIAGNOSIS — Z79899 Other long term (current) drug therapy: Secondary | ICD-10-CM | POA: Diagnosis not present

## 2013-09-29 DIAGNOSIS — Z87442 Personal history of urinary calculi: Secondary | ICD-10-CM | POA: Insufficient documentation

## 2013-09-29 DIAGNOSIS — N201 Calculus of ureter: Secondary | ICD-10-CM | POA: Diagnosis not present

## 2013-09-29 LAB — URINALYSIS, ROUTINE W REFLEX MICROSCOPIC
Bilirubin Urine: NEGATIVE
Glucose, UA: NEGATIVE mg/dL
Ketones, ur: NEGATIVE mg/dL
Leukocytes, UA: NEGATIVE
Nitrite: NEGATIVE
Protein, ur: 30 mg/dL — AB
SPECIFIC GRAVITY, URINE: 1.02 (ref 1.005–1.030)
Urobilinogen, UA: 0.2 mg/dL (ref 0.0–1.0)
pH: 6 (ref 5.0–8.0)

## 2013-09-29 LAB — I-STAT CHEM 8, ED
BUN: 13 mg/dL (ref 6–23)
CREATININE: 1.5 mg/dL — AB (ref 0.50–1.35)
Calcium, Ion: 1.12 mmol/L — ABNORMAL LOW (ref 1.13–1.30)
Chloride: 105 mEq/L (ref 96–112)
Glucose, Bld: 102 mg/dL — ABNORMAL HIGH (ref 70–99)
HCT: 40 % (ref 39.0–52.0)
HEMOGLOBIN: 13.6 g/dL (ref 13.0–17.0)
POTASSIUM: 3.9 meq/L (ref 3.7–5.3)
Sodium: 142 mEq/L (ref 137–147)
TCO2: 26 mmol/L (ref 0–100)

## 2013-09-29 LAB — URINE MICROSCOPIC-ADD ON

## 2013-09-29 MED ORDER — HYDROCODONE-ACETAMINOPHEN 5-325 MG PO TABS: 1.0000 | ORAL_TABLET | Freq: Four times a day (QID) | ORAL | Status: DC | PRN

## 2013-09-29 MED ORDER — ONDANSETRON 4 MG PO TBDP: ORAL_TABLET | ORAL | Status: DC

## 2013-09-29 MED ORDER — TAMSULOSIN HCL 0.4 MG PO CAPS
0.4000 mg | ORAL_CAPSULE | Freq: Every day | ORAL | Status: DC
Start: 2013-09-29 — End: 2014-07-05

## 2013-09-29 NOTE — ED Provider Notes (Signed)
CSN: [id number]     Arrival date & time 09/29/13  0614 History   First MD Initiated Contact with Patient 09/29/13 [phone]     Chief Complaint  Patient presents with  . Dysuria  . Urinary Retention  . Nephrolithiasis      Patient is a 68 y.o. male presenting with dysuria. The history is provided by the patient and a significant other.  Dysuria This is a new problem. The current episode started 6 to 12 hours ago. The problem has not changed since onset.Associated symptoms include abdominal pain. Pertinent negatives include no chest pain and no shortness of breath. Nothing relieves the symptoms.  pt reports last weekend he noticed pain in his left flank.  He reports associated nausea/vomiting at that time.  He reports the flank pain has improved, but he now has difficutly urinating and dysuria.  No fever. He is most concerned about lack of urine output but he admits to minimal intake since yesterday.  He has no other complaints He has h/o colon CA but has done well since partial colectomy and is not on chemotherapy  Past Medical History  Diagnosis Date  . BPH (benign prostatic hyperplasia)   . Kidney stones 2011  . Tooth infection 07/03/2011  . Colonic mass 07/03/2011  . Erosive gastritis 07/03/2011  . Renal calculus   . Difficult intubation     wife states they were told he had to have an "extra long tube" for intubation, had trouble with this when he had colon resection 07-04-11 Dr Addison Naegeli  . Cancer     colon  . Cataract    Past Surgical History  Procedure Laterality Date  . Hernia repair    . Prostate biopsy    . Esophagogastroduodenoscopy  07/03/2011    Procedure: ESOPHAGOGASTRODUODENOSCOPY (EGD);  Surgeon: Rogene Houston, MD;  [facility];  Service: Endoscopy;  Laterality: N/A;  . Colonoscopy  07/03/2011    Procedure: COLONOSCOPY;  Surgeon: Rogene Houston, MD;  [facility];  Service: Endoscopy;  Laterality: N/A;  . Partial colectomy  07/04/2011    Procedure:  PARTIAL COLECTOMY;  Surgeon: Jamesetta So, MD;  Location: [facility];  Service: General;  Laterality: N/A;  . Cystoscopy  07/09/2011    Procedure: Erlene Quan;  Surgeon: Marissa Nestle, MD;  Location: [facility];  Service: Urology;  Laterality: N/A;  . Colon surgery    . Transurethral resection of prostate  07/11/2011    Procedure: TRANSURETHRAL RESECTION OF THE PROSTATE (TURP);  Surgeon: Marissa Nestle, MD;  Location: [facility];  Service: Urology;  Laterality: N/A;  . Cystoscopy with litholapaxy  07/11/2011    Procedure: CYSTOSCOPY WITH LITHOLAPAXY;  Surgeon: Marissa Nestle, MD;  Location: [facility];  Service: Urology;  Laterality: N/A;  . Cataract extraction w/phaco Left 08/23/2012    Procedure: CATARACT EXTRACTION PHACO AND INTRAOCULAR LENS PLACEMENT (IOC);  Surgeon: Tonny Branch, MD;  Location: [facility];  Service: Ophthalmology;  Laterality: Left;  CDE 16.64  . Colonoscopy N/A 09/16/2012    Procedure: COLONOSCOPY;  Surgeon: Rogene Houston, MD;  [facility];  Service: Endoscopy;  Laterality: N/A;  930  . Cataract extraction w/phaco Right 06/09/2013    Procedure: CATARACT EXTRACTION PHACO AND INTRAOCULAR LENS PLACEMENT (IOC) CDE=10.86;  Surgeon: Tonny Branch, MD;  [facility]: [facility];  Service: Ophthalmology;  Laterality: Right;   Family History  Problem Relation Age of Onset  . Alzheimer's disease Mother   . Alzheimer's disease Father   .  Aneurysm Brother     brain  . Anuerysm Brother   . Colon cancer Neg Hx   . Healthy Son   . Healthy Son    History  Substance Use Topics  . Smoking status: Former Smoker -- 2.00 packs/day for 25 years    Types: Cigarettes    Quit date: 06/02/1989  . Smokeless tobacco: Never Used  . Alcohol Use: No    Review of Systems  Constitutional: Negative for fever.  Respiratory: Negative for shortness of breath.   Cardiovascular: Negative for chest pain.  Gastrointestinal: Positive for abdominal pain.  Genitourinary: Positive for dysuria and difficulty  urinating. Negative for testicular pain.  Neurological: Negative for weakness.  All other systems reviewed and are negative.     Allergies  Review of patient's allergies indicates no known allergies.  Home Medications   Prior to Admission medications   Medication Sig Start Date End Date Taking? Authorizing Provider  fish oil-omega-3 fatty acids 1000 MG capsule Take 1 g by mouth daily.   Yes Historical Provider, MD  Multiple Vitamin (MULTIVITAMIN WITH MINERALS) TABS Take 1 tablet by mouth daily.   Yes Historical Provider, MD  pantoprazole (PROTONIX) 40 MG tablet Take 1 tablet (40 mg total) by mouth daily. 02/08/13  Yes Rogene Houston, MD   BP 124/67  Pulse 67  Temp(Src) 97.9 F (36.6 C) (Oral)  Resp 18  Ht 6\' 2"  (1.88 m)  Wt 242 lb (109.77 kg)  BMI 31.06 kg/m2  SpO2 100% Physical Exam CONSTITUTIONAL: Well developed/well nourished HEAD: Normocephalic/atraumatic EYES: EOMI ENMT: Mucous membranes moist NECK: supple no meningeal signs SPINE:entire spine nontender CV: S1/S2 noted, no murmurs/rubs/gallops noted LUNGS: Lungs are clear to auscultation bilaterally, no apparent distress ABDOMEN: soft, nontender, no rebound or guarding. No suprapubic tenderness noted GU:no cva tenderness NEURO: Pt is awake/alert, moves all extremitiesx4 EXTREMITIES: pulses normal, full ROM SKIN: warm, color normal PSYCH: no abnormalities of mood noted  ED Course  Procedures  Labs Review Labs Reviewed  URINALYSIS, ROUTINE W REFLEX MICROSCOPIC  I-STAT CHEM 8, ED    Pt with h/o left renal stone per CT imaging from 12/2012 and he admits to having kidney stones previously He had pain earlier this week similar to prior kidney stones but he did not seek medical attention and only took NSAIDs.  Most of his pain has resolved but now is concerned due to lack of urine output and dysuria.  He is well appearing.  Bedside bladder scan by nursing reveals minimal urine in bladder. Will give PO fluids, and  obtain urinalysis and chem 8 panel Pt does not want any pain meds at this time At signout to dr zammit, f/u on chem8 and urinalysis  MDM   Final diagnoses:  Dysuria    Nursing notes including past medical history and social history reviewed and considered in documentation Previous records reviewed and considered     Sharyon Cable, MD 09/29/13 [phone]

## 2013-09-29 NOTE — ED Notes (Signed)
Pt reports being told he had a kidney stone on the right side. Pt believes the stone has since moved to the bladder beginning Sunday. Pt now c/o urinary retention and dysuria when he is able to void since last night.

## 2013-09-29 NOTE — ED Notes (Signed)
Pt reports taking 400 mg of Ibuprofen at roughly 2200 last night for the discomfort.

## 2013-09-29 NOTE — Discharge Instructions (Signed)
Follow-up with [facility]. °

## 2013-10-03 ENCOUNTER — Encounter (HOSPITAL_COMMUNITY): Payer: Self-pay | Admitting: Emergency Medicine

## 2013-10-03 ENCOUNTER — Emergency Department (HOSPITAL_COMMUNITY)
Admission: EM | Admit: 2013-10-03 | Discharge: 2013-10-03 | Disposition: A | Discharge status: Home / Self Care | Payer: Medicare Other | Attending: Emergency Medicine | Admitting: Emergency Medicine

## 2013-10-03 ENCOUNTER — Emergency Department (HOSPITAL_COMMUNITY): Payer: Medicare Other

## 2013-10-03 DIAGNOSIS — Z85038 Personal history of other malignant neoplasm of large intestine: Secondary | ICD-10-CM | POA: Insufficient documentation

## 2013-10-03 DIAGNOSIS — R197 Diarrhea, unspecified: Secondary | ICD-10-CM | POA: Diagnosis not present

## 2013-10-03 DIAGNOSIS — Z792 Long term (current) use of antibiotics: Secondary | ICD-10-CM | POA: Insufficient documentation

## 2013-10-03 DIAGNOSIS — Z9089 Acquired absence of other organs: Secondary | ICD-10-CM | POA: Diagnosis not present

## 2013-10-03 DIAGNOSIS — Z8669 Personal history of other diseases of the nervous system and sense organs: Secondary | ICD-10-CM | POA: Diagnosis not present

## 2013-10-03 DIAGNOSIS — K2960 Other gastritis without bleeding: Secondary | ICD-10-CM | POA: Insufficient documentation

## 2013-10-03 DIAGNOSIS — Z79899 Other long term (current) drug therapy: Secondary | ICD-10-CM | POA: Diagnosis not present

## 2013-10-03 DIAGNOSIS — Z87891 Personal history of nicotine dependence: Secondary | ICD-10-CM | POA: Insufficient documentation

## 2013-10-03 DIAGNOSIS — N400 Enlarged prostate without lower urinary tract symptoms: Secondary | ICD-10-CM | POA: Diagnosis not present

## 2013-10-03 DIAGNOSIS — N201 Calculus of ureter: Secondary | ICD-10-CM | POA: Insufficient documentation

## 2013-10-03 LAB — CBC WITH DIFFERENTIAL/PLATELET
BASOS ABS: 0 10*3/uL (ref 0.0–0.1)
BASOS PCT: 0 % (ref 0–1)
Eosinophils Absolute: 0 10*3/uL (ref 0.0–0.7)
Eosinophils Relative: 0 % (ref 0–5)
HCT: 39.9 % (ref 39.0–52.0)
HEMOGLOBIN: 14 g/dL (ref 13.0–17.0)
Lymphocytes Relative: 10 % — ABNORMAL LOW (ref 12–46)
Lymphs Abs: 1 10*3/uL (ref 0.7–4.0)
MCH: 31.1 pg (ref 26.0–34.0)
MCHC: 35.1 g/dL (ref 30.0–36.0)
MCV: 88.7 fL (ref 78.0–100.0)
Monocytes Absolute: 0.8 10*3/uL (ref 0.1–1.0)
Monocytes Relative: 8 % (ref 3–12)
NEUTROS ABS: 8.2 10*3/uL — AB (ref 1.7–7.7)
NEUTROS PCT: 82 % — AB (ref 43–77)
Platelets: 292 10*3/uL (ref 150–400)
RBC: 4.5 MIL/uL (ref 4.22–5.81)
RDW: 12.9 % (ref 11.5–15.5)
WBC: 10 10*3/uL (ref 4.0–10.5)

## 2013-10-03 LAB — COMPREHENSIVE METABOLIC PANEL
ALT: 27 U/L (ref 0–53)
AST: 27 U/L (ref 0–37)
Albumin: 3.3 g/dL — ABNORMAL LOW (ref 3.5–5.2)
Alkaline Phosphatase: 74 U/L (ref 39–117)
BUN: 18 mg/dL (ref 6–23)
CALCIUM: 9.2 mg/dL (ref 8.4–10.5)
CO2: 28 meq/L (ref 19–32)
Chloride: 97 mEq/L (ref 96–112)
Creatinine, Ser: 1.52 mg/dL — ABNORMAL HIGH (ref 0.50–1.35)
GFR calc non Af Amer: 45 mL/min — ABNORMAL LOW (ref >90)
GFR, EST AFRICAN AMERICAN: 53 mL/min — AB (ref >90)
Glucose, Bld: 116 mg/dL — ABNORMAL HIGH (ref 70–99)
POTASSIUM: 4.2 meq/L (ref 3.7–5.3)
Sodium: 138 mEq/L (ref 137–147)
Total Bilirubin: 0.5 mg/dL (ref 0.3–1.2)
Total Protein: 7.4 g/dL (ref 6.0–8.3)

## 2013-10-03 LAB — URINALYSIS, ROUTINE W REFLEX MICROSCOPIC
BILIRUBIN URINE: NEGATIVE
Glucose, UA: NEGATIVE mg/dL
Ketones, ur: NEGATIVE mg/dL
NITRITE: NEGATIVE
PH: 6 (ref 5.0–8.0)
PROTEIN: 30 mg/dL — AB
Specific Gravity, Urine: 1.025 (ref 1.005–1.030)
UROBILINOGEN UA: 0.2 mg/dL (ref 0.0–1.0)

## 2013-10-03 LAB — URINE MICROSCOPIC-ADD ON

## 2013-10-03 LAB — LIPASE, BLOOD: Lipase: 24 U/L (ref 11–59)

## 2013-10-03 LAB — LACTIC ACID, PLASMA: LACTIC ACID, VENOUS: 1 mmol/L (ref 0.5–2.2)

## 2013-10-03 MED ORDER — DIPHENOXYLATE-ATROPINE 2.5-0.025 MG PO TABS
2.0000 | ORAL_TABLET | Freq: Once | ORAL | Status: AC
Start: 2013-10-03 — End: 2013-10-03
  Administered 2013-10-03: 2 via ORAL
  Filled 2013-10-03: qty 2

## 2013-10-03 MED ORDER — ONDANSETRON HCL 4 MG/2ML IJ SOLN
4.0000 mg | Freq: Once | INTRAMUSCULAR | Status: AC
  Administered 2013-10-03: 4 mg via INTRAVENOUS
  Filled 2013-10-03: qty 2

## 2013-10-03 MED ORDER — SODIUM CHLORIDE 0.9 % IV BOLUS (SEPSIS)
1000.0000 mL | Freq: Once | INTRAVENOUS | Status: AC
  Administered 2013-10-03: 1000 mL via INTRAVENOUS

## 2013-10-03 MED ORDER — SODIUM CHLORIDE 0.9 % IV SOLN
Freq: Once | INTRAVENOUS | Status: AC
  Administered 2013-10-03: 18:00:00 via INTRAVENOUS

## 2013-10-03 MED ORDER — IOHEXOL 300 MG/ML  SOLN
100.0000 mL | Freq: Once | INTRAMUSCULAR | Status: AC | PRN
  Administered 2013-10-03: 80 mL via INTRAVENOUS

## 2013-10-03 MED ORDER — IOHEXOL 300 MG/ML  SOLN
50.0000 mL | Freq: Once | INTRAMUSCULAR | Status: AC | PRN
  Administered 2013-10-03: 50 mL via ORAL

## 2013-10-03 MED ORDER — DIPHENOXYLATE-ATROPINE 2.5-0.025 MG PO TABS: 1.0000 | ORAL_TABLET | Freq: Four times a day (QID) | ORAL | Status: DC | PRN

## 2013-10-03 MED ORDER — MORPHINE SULFATE 4 MG/ML IJ SOLN
4.0000 mg | INTRAMUSCULAR | Status: DC | PRN
  Administered 2013-10-03: 4 mg via INTRAVENOUS
  Filled 2013-10-03: qty 1

## 2013-10-03 NOTE — ED Notes (Addendum)
Dx with kidney stone  Seen here  4/30 , Having diarrhea, weakness,  Arms "feel heavy.  " Seen by urologist today. And lab work done.  Dr Louis Meckel.  ,    Dr Louis Meckel thought he may have bowel infection and talked to Dr Nevada Crane.  .  Pt stopped by Dr Juel Burrow office to get collection containers for stool spec.  Called byRN here , and told to return  Since pt not feeling  well

## 2013-10-03 NOTE — ED Provider Notes (Signed)
CSN: [id number]     Arrival date & time 10/03/13  1423 History   First MD Initiated Contact with Patient 10/03/13 1437     Chief Complaint  Patient presents with  . Abdominal Pain      HPI  Patient presents with diarrhea and abdominal pain. Seen and evaluated here April 30. CT scan showed a small left distal ureteral stone. States his pain resolved within 24 hours. He was at [facility] urology this morning for followup. The last 3 days, he has had diffuse abdominal pain. Rather constant. Frequent diarrhea. He estimates up to 10 episodes of diarrhea per day. No blood pus or mucus in his stools. Bodyaches and chills. No frank rigors. He states all his joints hurt.  In particular his hips, hands, elbows, and shoulders.  Was seen at [facility] urology morning. His wife states they obtained urine and blood testing and she states "they weren't concerned" about this pain from his kidney stone. Apparently the urologist discussed the case with his primary care physician, Dr. Gwyndolyn Saxon. Antibiotic was called in for a possible colitis. Patient felt poorly and presents here.  Patient is two years, and 2 months status post right hemicolectomy, and appendectomy for colon cancer. Presented with anemia as his presenting symptom.  Cancer was low-grade. Did not have adjuvant chemotherapy. Gets yearly exams with oncology that are "normal", per patient.  Past Medical History  Diagnosis Date  . BPH (benign prostatic hyperplasia)   . Kidney stones 2011  . Tooth infection 07/03/2011  . Colonic mass 07/03/2011  . Erosive gastritis 07/03/2011  . Renal calculus   . Difficult intubation     wife states they were told he had to have an "extra long tube" for intubation, had trouble with this when he had colon resection 07-04-11 Dr Addison Naegeli  . Cataract   . Cancer     colon   Past Surgical History  Procedure Laterality Date  . Hernia repair    . Prostate biopsy    . Esophagogastroduodenoscopy  07/03/2011    Procedure:  ESOPHAGOGASTRODUODENOSCOPY (EGD);  Surgeon: Rogene Houston, MD;  [facility];  Service: Endoscopy;  Laterality: N/A;  . Colonoscopy  07/03/2011    Procedure: COLONOSCOPY;  Surgeon: Rogene Houston, MD;  [facility];  Service: Endoscopy;  Laterality: N/A;  . Partial colectomy  07/04/2011    Procedure: PARTIAL COLECTOMY;  Surgeon: Jamesetta So, MD;  [facility];  Service: General;  Laterality: N/A;  . Cystoscopy  07/09/2011    Procedure: Erlene Quan;  Surgeon: Marissa Nestle, MD;  [facility];  Service: Urology;  Laterality: N/A;  . Colon surgery    . Transurethral resection of prostate  07/11/2011    Procedure: TRANSURETHRAL RESECTION OF THE PROSTATE (TURP);  Surgeon: Marissa Nestle, MD;  [facility];  Service: Urology;  Laterality: N/A;  . Cystoscopy with litholapaxy  07/11/2011    Procedure: CYSTOSCOPY WITH LITHOLAPAXY;  Surgeon: Marissa Nestle, MD;  [facility];  Service: Urology;  Laterality: N/A;  . Cataract extraction w/phaco Left 08/23/2012    Procedure: CATARACT EXTRACTION PHACO AND INTRAOCULAR LENS PLACEMENT (IOC);  Surgeon: Tonny Branch, MD;  [facility];  Service: Ophthalmology;  Laterality: Left;  CDE 16.64  . Colonoscopy N/A 09/16/2012    Procedure: COLONOSCOPY;  Surgeon: Rogene Houston, MD;  [facility];  Service: Endoscopy;  Laterality: N/A;  930  . Cataract extraction w/phaco Right 06/09/2013  Procedure: CATARACT EXTRACTION PHACO AND INTRAOCULAR LENS PLACEMENT (IOC) CDE=10.86;  Surgeon: Tonny Branch, MD;  [facility];  Service: Ophthalmology;  Laterality: Right;   Family History  Problem Relation Age of Onset  . Alzheimer's disease Mother   . Alzheimer's disease Father   . Aneurysm Brother     brain  . Anuerysm Brother   . Colon cancer Neg Hx   . Healthy Son   . Healthy Son    History  Substance Use Topics  . Smoking status: Former Smoker -- 2.00 packs/day for 25 years    Types: Cigarettes    Quit  date: 06/02/1989  . Smokeless tobacco: Never Used  . Alcohol Use: No    Review of Systems  Constitutional: Positive for chills and fatigue. Negative for fever, diaphoresis and appetite change.  HENT: Negative for mouth sores, sore throat and trouble swallowing.   Eyes: Negative for visual disturbance.  Respiratory: Negative for cough, chest tightness, shortness of breath and wheezing.   Cardiovascular: Negative for chest pain.  Gastrointestinal: Positive for nausea, abdominal pain and diarrhea. Negative for vomiting and abdominal distention.  Endocrine: Negative for polydipsia, polyphagia and polyuria.  Genitourinary: Negative for dysuria, frequency and hematuria.  Musculoskeletal: Negative for gait problem.  Skin: Negative for color change, pallor and rash.  Neurological: Negative for dizziness, syncope, light-headedness and headaches.  Hematological: Does not bruise/bleed easily.  Psychiatric/Behavioral: Negative for behavioral problems and confusion.      Allergies  Review of patient's allergies indicates no known allergies.  Home Medications   Prior to Admission medications   Medication Sig Start Date End Date Taking? Authorizing Provider  ciprofloxacin (CIPRO) 500 MG tablet Take 500 mg by mouth 2 (two) times daily. 10 day course starting on 10/03/2013 10/03/13   Historical Provider, MD  fish oil-omega-3 fatty acids 1000 MG capsule Take 1 g by mouth daily.    Historical Provider, MD  HYDROcodone-acetaminophen (NORCO/VICODIN) 5-325 MG per tablet Take 1 tablet by mouth every 6 (six) hours as needed for moderate pain. 09/29/13   Maudry Diego, MD  ibuprofen (ADVIL) 200 MG tablet Take 400 mg by mouth every 6 (six) hours as needed for moderate pain.    Historical Provider, MD  metroNIDAZOLE (FLAGYL) 500 MG tablet Take 500 mg by mouth 3 (three) times daily. 10 day course starting on 10/03/2013 10/03/13   Historical Provider, MD  Multiple Vitamin (MULTIVITAMIN WITH MINERALS) TABS Take 1  tablet by mouth daily.    Historical Provider, MD  ondansetron (ZOFRAN ODT) 4 MG disintegrating tablet 4mg  ODT q4 hours prn nausea/vomit 09/29/13   Maudry Diego, MD  pantoprazole (PROTONIX) 40 MG tablet Take 1 tablet (40 mg total) by mouth daily. 02/08/13   Rogene Houston, MD  tamsulosin (FLOMAX) 0.4 MG CAPS capsule Take 1 capsule (0.4 mg total) by mouth daily. 09/29/13   Maudry Diego, MD   BP 110/85  Pulse 83  Temp(Src) 100.1 F (37.8 C) (Oral)  Resp 20  Ht 6\' 3"  (1.905 m)  Wt 242 lb (109.77 kg)  BMI 30.25 kg/m2  SpO2 100% Physical Exam  Constitutional: He is oriented to person, place, and time. He appears well-developed and well-nourished. No distress.  HENT:  Head: Normocephalic.  Eyes: Conjunctivae are normal. Pupils are equal, round, and reactive to light. No scleral icterus.  Neck: Normal range of motion. Neck supple. No thyromegaly present.  Cardiovascular: Normal rate and regular rhythm.  Exam reveals no gallop and no friction rub.  No murmur heard. Pulmonary/Chest: Effort normal and breath sounds normal. No respiratory distress. He has no wheezes. He has no rales.  Abdominal: Soft. Bowel sounds are normal. He exhibits no distension. There is tenderness. There is no rebound.    Musculoskeletal: Normal range of motion.  Neurological: He is alert and oriented to person, place, and time.  Skin: Skin is warm and dry. No rash noted.  Psychiatric: He has a normal mood and affect. His behavior is normal.    ED Course  Procedures (including critical care time) Labs Review Labs Reviewed  URINALYSIS, ROUTINE W REFLEX MICROSCOPIC - Abnormal; Notable for the following:    Hgb urine dipstick MODERATE (*)    Protein, ur 30 (*)    Leukocytes, UA MODERATE (*)    All other components within normal limits  CBC WITH DIFFERENTIAL - Abnormal; Notable for the following:    Neutrophils Relative % 82 (*)    Neutro Abs 8.2 (*)    Lymphocytes Relative 10 (*)    All other components  within normal limits  COMPREHENSIVE METABOLIC PANEL - Abnormal; Notable for the following:    Glucose, Bld 116 (*)    Creatinine, Ser 1.52 (*)    Albumin 3.3 (*)    GFR calc non Af Amer 45 (*)    GFR calc Af Amer 53 (*)    All other components within normal limits  URINE MICROSCOPIC-ADD ON - Abnormal; Notable for the following:    Bacteria, UA MANY (*)    All other components within normal limits  URINE CULTURE  LIPASE, BLOOD  LACTIC ACID, PLASMA    Imaging Review Ct Abdomen Pelvis W Contrast  10/03/2013   CLINICAL DATA:  Abdominal pain, diarrhea  EXAM: CT ABDOMEN AND PELVIS WITH CONTRAST  TECHNIQUE: Multidetector CT imaging of the abdomen and pelvis was performed using the standard protocol following bolus administration of intravenous contrast.  CONTRAST:  89mL OMNIPAQUE IOHEXOL 300 MG/ML SOLN, 8mL OMNIPAQUE IOHEXOL 300 MG/ML SOLN  COMPARISON:  CT 09/29/2013  FINDINGS: There is again demonstrated moderate left hydronephrosis which is not improved in interval. . There is mild hydroureter on the left secondary to an obstructing calculus within the left vesicoureteral junction. The calculus has migrated 1 cm and now on the vesicular side of the vesicoureteral junction (Image 81, series 2). No right hydronephrosis or hydroureter.  Lung bases are clear. No focal hepatic lesions non contrast exam. The gallbladder pancreas normal. Spleen adrenal glands normal.  Stomach, small bowel, colon are unremarkable. There is anastomosed on the ascending colon. Ventral hernia mesh.  Abdominal or is normal caliber. No retroperitoneal periportal adenopathy  No free fluid the pelvis. Prostate gland is normal. No pelvic lymphadenopathy. No aggressive osseous lesion.  IMPRESSION: 1. Obstructing distal left ureteral calculus has migrated 1 cm in is now at the vesicoureteral junction. The stone is lodged on the vesicular side of the vesicoureteral junction. 2. Persistent hydronephrosis on the left not improved.    Electronically Signed   By: Suzy Bouchard M.D.   On: 10/03/2013 17:07     EKG Interpretation None      MDM   Final diagnoses:  Ureteral stone  Diarrhea    No blood to suggest frank colitis. May be viral enteritis. May be bacterial colitis. May be diverticulitis. His wife remembers having diverticuli on some of this testing circa his colon cancer.  Plan is hydration. Some control. Lab. CT scan with contrast.  His renal function is essentially unchanged from 4  days ago.  CT scan shows migration of his ureteral stone to the vesicular verge. He does have white blood cells in his urine. His peripheral episode has not elevated. CT does not show other obvious focal abnormality or infection.  Discussion:  Urology today. Has an appointment in 3 days. Asked him to continue the Flagyl, and Cipro prescribed today. He was given an additional liter of IV fluid here. Given the first dose of Lomotil. He is feeling considerably better.    Tanna Furry, MD 10/03/13 [phone]

## 2013-10-03 NOTE — Discharge Instructions (Signed)
Lomotil as needed for diarrhea. Stay hydrated. Keep your appointment with the urologist on Thursday. Continue the antibiotics prescribed by your primary care physician.  Diarrhea Diarrhea is frequent loose and watery bowel movements. It can cause you to feel weak and dehydrated. Dehydration can cause you to become tired and thirsty, have a dry mouth, and have decreased urination that often is dark yellow. Diarrhea is a sign of another problem, most often an infection that will not last long. In most cases, diarrhea typically lasts 2 3 days. However, it can last longer if it is a sign of something more serious. It is important to treat your diarrhea as directed by your caregive to lessen or prevent future episodes of diarrhea. CAUSES  Some common causes include:  Gastrointestinal infections caused by viruses, bacteria, or parasites.  Food poisoning or food allergies.  Certain medicines, such as antibiotics, chemotherapy, and laxatives.  Artificial sweeteners and fructose.  Digestive disorders. HOME CARE INSTRUCTIONS  Ensure adequate fluid intake (hydration): have 1 cup (8 oz) of fluid for each diarrhea episode. Avoid fluids that contain simple sugars or sports drinks, fruit juices, whole milk products, and sodas. Your urine should be clear or pale yellow if you are drinking enough fluids. Hydrate with an oral rehydration solution that you can purchase at pharmacies, retail stores, and online. You can prepare an oral rehydration solution at home by mixing the following ingredients together:    tsp table salt.   tsp baking soda.   tsp salt substitute containing potassium chloride.  1  tablespoons sugar.  1 L (34 oz) of water.  Certain foods and beverages may increase the speed at which food moves through the gastrointestinal (GI) tract. These foods and beverages should be avoided and include:  Caffeinated and alcoholic beverages.  High-fiber foods, such as raw fruits and  vegetables, nuts, seeds, and whole grain breads and cereals.  Foods and beverages sweetened with sugar alcohols, such as xylitol, sorbitol, and mannitol.  Some foods may be well tolerated and may help thicken stool including:  Starchy foods, such as rice, toast, pasta, low-sugar cereal, oatmeal, grits, baked potatoes, crackers, and bagels.  Bananas.  Applesauce.  Add probiotic-rich foods to help increase healthy bacteria in the GI tract, such as yogurt and fermented milk products.  Wash your hands well after each diarrhea episode.  Only take over-the-counter or prescription medicines as directed by your caregiver.  Take a warm bath to relieve any burning or pain from frequent diarrhea episodes. SEEK IMMEDIATE MEDICAL CARE IF:   You are unable to keep fluids down.  You have persistent vomiting.  You have blood in your stool, or your stools are black and tarry.  You do not urinate in 6 8 hours, or there is only a small amount of very dark urine.  You have abdominal pain that increases or localizes.  You have weakness, dizziness, confusion, or lightheadedness.  You have a severe headache.  Your diarrhea gets worse or does not get better.  You have a fever or persistent symptoms for more than 2 3 days.  You have a fever and your symptoms suddenly get worse. MAKE SURE YOU:   Understand these instructions.  Will watch your condition.  Will get help right away if you are not doing well or get worse. Document Released: 05/09/2002 Document Revised: 05/05/2012 Document Reviewed: 01/25/2012 [facility] Patient Information 2014 [place], [place].

## 2013-10-04 LAB — URINE CULTURE

## 2013-10-06 DIAGNOSIS — N201 Calculus of ureter: Secondary | ICD-10-CM | POA: Diagnosis not present

## 2013-11-29 DIAGNOSIS — N201 Calculus of ureter: Secondary | ICD-10-CM | POA: Diagnosis not present

## 2013-11-29 DIAGNOSIS — N210 Calculus in bladder: Secondary | ICD-10-CM | POA: Diagnosis not present

## 2013-11-29 DIAGNOSIS — R972 Elevated prostate specific antigen [PSA]: Secondary | ICD-10-CM | POA: Diagnosis not present

## 2014-02-01 ENCOUNTER — Encounter (HOSPITAL_COMMUNITY): Payer: Medicare Other | Attending: Hematology and Oncology

## 2014-02-01 DIAGNOSIS — C189 Malignant neoplasm of colon, unspecified: Secondary | ICD-10-CM | POA: Diagnosis present

## 2014-02-01 DIAGNOSIS — K2270 Barrett's esophagus without dysplasia: Secondary | ICD-10-CM | POA: Insufficient documentation

## 2014-02-01 DIAGNOSIS — D509 Iron deficiency anemia, unspecified: Secondary | ICD-10-CM | POA: Diagnosis not present

## 2014-02-01 DIAGNOSIS — C180 Malignant neoplasm of cecum: Secondary | ICD-10-CM | POA: Insufficient documentation

## 2014-02-01 DIAGNOSIS — N200 Calculus of kidney: Secondary | ICD-10-CM | POA: Insufficient documentation

## 2014-02-01 DIAGNOSIS — R55 Syncope and collapse: Secondary | ICD-10-CM | POA: Diagnosis not present

## 2014-02-01 DIAGNOSIS — K909 Intestinal malabsorption, unspecified: Secondary | ICD-10-CM

## 2014-02-01 DIAGNOSIS — N400 Enlarged prostate without lower urinary tract symptoms: Secondary | ICD-10-CM | POA: Insufficient documentation

## 2014-02-01 LAB — COMPREHENSIVE METABOLIC PANEL
ALBUMIN: 3.8 g/dL (ref 3.5–5.2)
ALT: 26 U/L (ref 0–53)
AST: 37 U/L (ref 0–37)
Alkaline Phosphatase: 65 U/L (ref 39–117)
Anion gap: 12 (ref 5–15)
BILIRUBIN TOTAL: 0.6 mg/dL (ref 0.3–1.2)
BUN: 12 mg/dL (ref 6–23)
CO2: 26 mEq/L (ref 19–32)
Calcium: 9.2 mg/dL (ref 8.4–10.5)
Chloride: 104 mEq/L (ref 96–112)
Creatinine, Ser: 0.97 mg/dL (ref 0.50–1.35)
GFR calc Af Amer: >90 mL/min (ref >90)
GFR calc non Af Amer: 82 mL/min — ABNORMAL LOW (ref >90)
Glucose, Bld: 132 mg/dL — ABNORMAL HIGH (ref 70–99)
POTASSIUM: 4.3 meq/L (ref 3.7–5.3)
Sodium: 142 mEq/L (ref 137–147)
TOTAL PROTEIN: 7.2 g/dL (ref 6.0–8.3)

## 2014-02-01 LAB — CBC WITH DIFFERENTIAL/PLATELET
BASOS ABS: 0.1 10*3/uL (ref 0.0–0.1)
BASOS PCT: 1 % (ref 0–1)
EOS ABS: 0.3 10*3/uL (ref 0.0–0.7)
Eosinophils Relative: 4 % (ref 0–5)
HCT: 43.5 % (ref 39.0–52.0)
Hemoglobin: 15.2 g/dL (ref 13.0–17.0)
Lymphocytes Relative: 27 % (ref 12–46)
Lymphs Abs: 1.8 10*3/uL (ref 0.7–4.0)
MCH: 31 pg (ref 26.0–34.0)
MCHC: 34.9 g/dL (ref 30.0–36.0)
MCV: 88.8 fL (ref 78.0–100.0)
Monocytes Absolute: 0.4 10*3/uL (ref 0.1–1.0)
Monocytes Relative: 6 % (ref 3–12)
NEUTROS ABS: 4 10*3/uL (ref 1.7–7.7)
NEUTROS PCT: 62 % (ref 43–77)
PLATELETS: 244 10*3/uL (ref 150–400)
RBC: 4.9 MIL/uL (ref 4.22–5.81)
RDW: 12.6 % (ref 11.5–15.5)
WBC: 6.6 10*3/uL (ref 4.0–10.5)

## 2014-02-01 LAB — CEA: CEA: 1.7 ng/mL (ref 0.0–5.0)

## 2014-02-01 LAB — FERRITIN: Ferritin: 230 ng/mL (ref 22–322)

## 2014-02-01 NOTE — Progress Notes (Signed)
Labs drawn for ferr,cea,cmp, cbd

## 2014-02-03 ENCOUNTER — Encounter (HOSPITAL_COMMUNITY): Payer: Self-pay

## 2014-02-03 ENCOUNTER — Encounter (HOSPITAL_BASED_OUTPATIENT_CLINIC_OR_DEPARTMENT_OTHER): Payer: Medicare Other

## 2014-02-03 VITALS — BP 126/76 | HR 67 | Temp 98.4°F | Resp 16 | Wt 243.4 lb

## 2014-02-03 DIAGNOSIS — N200 Calculus of kidney: Secondary | ICD-10-CM

## 2014-02-03 DIAGNOSIS — K909 Intestinal malabsorption, unspecified: Secondary | ICD-10-CM

## 2014-02-03 DIAGNOSIS — C189 Malignant neoplasm of colon, unspecified: Secondary | ICD-10-CM | POA: Diagnosis not present

## 2014-02-03 DIAGNOSIS — R55 Syncope and collapse: Secondary | ICD-10-CM

## 2014-02-03 DIAGNOSIS — D509 Iron deficiency anemia, unspecified: Secondary | ICD-10-CM | POA: Diagnosis not present

## 2014-02-03 DIAGNOSIS — C180 Malignant neoplasm of cecum: Secondary | ICD-10-CM | POA: Diagnosis not present

## 2014-02-03 DIAGNOSIS — N400 Enlarged prostate without lower urinary tract symptoms: Secondary | ICD-10-CM

## 2014-02-03 DIAGNOSIS — K2270 Barrett's esophagus without dysplasia: Secondary | ICD-10-CM | POA: Diagnosis not present

## 2014-02-03 NOTE — Patient Instructions (Signed)
[facility] Discharge Instructions  RECOMMENDATIONS MADE BY THE CONSULTANT AND ANY TEST RESULTS WILL BE SENT TO YOUR REFERRING PHYSICIAN.  EXAM FINDINGS BY THE PHYSICIAN TODAY AND SIGNS OR SYMPTOMS TO REPORT TO CLINIC OR PRIMARY PHYSICIAN: Exam and findings as discussed by Dr. Barnet Glasgow.  We will check a testosterone level today to see if we can determine why you are having night sweats.  Report changes in bowel habits, blood in your stool, unexplained weight loss, etc.    INSTRUCTIONS/FOLLOW-UP: Follow-up in 6 months with labs and office visit.  Thank you for choosing [facility] to provide your oncology and hematology care.  To afford each patient quality time with our providers, please arrive at least 15 minutes before your scheduled appointment time.  With your help, our goal is to use those 15 minutes to complete the necessary work-up to ensure our physicians have the information they need to help with your evaluation and healthcare recommendations.    Effective January 1st, 2014, we ask that you re-schedule your appointment with our physicians should you arrive 10 or more minutes late for your appointment.  We strive to give you quality time with our providers, and arriving late affects you and other patients whose appointments are after yours.    Again, thank you for choosing [facility].  Our hope is that these requests will decrease the amount of time that you wait before being seen by our physicians.       _____________________________________________________________  Should you have questions after your visit to [facility], please contact our office at [phone] between the hours of 8:30 a.m. and 4:30 p.m.  Voicemails left after 4:30 p.m. will not be returned until the following business day.  For prescription refill requests, have your pharmacy contact our office with your prescription refill request.     _______________________________________________________________  We hope that we have given you very good care.  You may receive a patient satisfaction survey in the mail, please complete it and return it as soon as possible.  We value your feedback!  _______________________________________________________________  Have you asked about our STAR program?  STAR stands for Survivorship Training and Rehabilitation, and this is a nationally recognized cancer care program that focuses on survivorship and rehabilitation.  Cancer and cancer treatments may cause problems, such as, pain, making you feel tired and keeping you from doing the things that you need or want to do. Cancer rehabilitation can help. Our goal is to reduce these troubling effects and help you have the best quality of life possible.  You may receive a survey from a nurse that asks questions about your current state of health.  Based on the survey results, all eligible patients will be referred to the [facility] program for an evaluation so we can better serve you!  A frequently asked questions sheet is available upon request.

## 2014-02-03 NOTE — Progress Notes (Signed)
[place], [facility], MD  [place] [address]  DIAGNOSIS: Adenocarcinoma of colon  Malabsorption of iron  BPH (benign prostatic hyperplasia)  Kidney stone on left side  Vasomotor instability - Plan: Testosterone, Testosterone  Chief Complaint  Patient presents with  . Colon Cancer  . Abdominal pain with diaphoresis    CURRENT THERAPY: Watchful expectation and surveillance. Feraheme on 08/10/2013  INTERVAL HISTORY: William Burns 68 y.o. male returns for followup of stage II-A carcinoma the cecum, status post right hemicolectomy on 07/07/2011 with no postop therapy. Last colonoscopy was in April of 2014 at which time a sessile adenoma was found with out high-grade dysplasia or malignancy. He was in the emergency room in May 2015 with abdominal pain and diarrhea. This has resolved. Over the past 2 months he said soaking night sweats with ragweed pollen causing problems for which he takes no medication. During the night sweats, he becomes pale and nauseated. He denies any diarrhea now, dysuria, urinary hesitancy, but does have stuffy nose with no earache, shortness of, easy satiety, abdominal distention, lower extremity swelling or redness, skin rash, headache, or seizures. He denies any flank pain now and thinks he has passed his kidney stone with last urologic evaluation demonstrating the stone in the bladder.    MEDICAL HISTORY: Past Medical History  Diagnosis Date  . BPH (benign prostatic hyperplasia)   . Kidney stones 2011  . Tooth infection 07/03/2011  . Colonic mass 07/03/2011  . Erosive gastritis 07/03/2011  . Renal calculus   . Difficult intubation     wife states they were told he had to have an "extra long tube" for intubation, had trouble with this when he had colon resection 07-04-11 Dr Addison Naegeli  . Cataract   . Cancer     colon    INTERIM HISTORY: has Microcytic hypochromic anemia;  Hx of colonic polyp; Hyperglycemia; BPH (benign prostatic hyperplasia); Tooth infection; Adenocarcinoma of colon; Erosive gastritis; Calculus, bladder; Short-segment Barrett's esophagus; Diarrhea; and Kidney stone on left side on his problem list.    ALLERGIES:  has No Known Allergies.  MEDICATIONS: has a current medication list which includes the following prescription(s): fish oil-omega-3 fatty acids, multivitamin with minerals, pantoprazole, probiotic, tamsulosin, diphenoxylate-atropine, ibuprofen, and ondansetron.  SURGICAL HISTORY:  Past Surgical History  Procedure Laterality Date  . Hernia repair    . Prostate biopsy    . Esophagogastroduodenoscopy  07/03/2011    Procedure: ESOPHAGOGASTRODUODENOSCOPY (EGD);  Surgeon: Rogene Houston, MD;  Location: [facility];  Service: Endoscopy;  Laterality: N/A;  . Colonoscopy  07/03/2011    Procedure: COLONOSCOPY;  Surgeon: Rogene Houston, MD;  Location: [facility];  Service: Endoscopy;  Laterality: N/A;  . Partial colectomy  07/04/2011    Procedure: PARTIAL COLECTOMY;  Surgeon: Jamesetta So, MD;  Location: [facility] ORS;  Service: General;  Laterality: N/A;  . Cystoscopy  07/09/2011    Procedure: Erlene Quan;  Surgeon: Marissa Nestle, MD;  Location: [facility] ORS;  Service: Urology;  Laterality: N/A;  . Colon surgery    . Transurethral resection of prostate  07/11/2011    Procedure: TRANSURETHRAL RESECTION OF THE PROSTATE (TURP);  Surgeon: Marissa Nestle, MD;  Location: [facility] ORS;  Service: Urology;  Laterality: N/A;  . Cystoscopy with litholapaxy  07/11/2011    Procedure: CYSTOSCOPY WITH LITHOLAPAXY;  Surgeon: Marissa Nestle, MD;  Location: [facility]  ORS;  Service: Urology;  Laterality: N/A;  . Cataract extraction w/phaco Left 08/23/2012    Procedure: CATARACT EXTRACTION PHACO AND INTRAOCULAR LENS PLACEMENT (IOC);  Surgeon: Tonny Branch, MD;  Location: [facility] ORS;  Service: Ophthalmology;  Laterality: Left;  CDE 16.64  . Colonoscopy N/A 09/16/2012     Procedure: COLONOSCOPY;  Surgeon: Rogene Houston, MD;  Location: [facility];  Service: Endoscopy;  Laterality: N/A;  930  . Cataract extraction w/phaco Right 06/09/2013    Procedure: CATARACT EXTRACTION PHACO AND INTRAOCULAR LENS PLACEMENT (IOC) CDE=10.86;  Surgeon: Tonny Branch, MD;  Location: [facility] ORS;  Service: Ophthalmology;  Laterality: Right;    FAMILY HISTORY: family history includes Alzheimer's disease in his father and mother; Aneurysm in his brother; Anuerysm in his brother; Healthy in his son and son. There is no history of Colon cancer.  SOCIAL HISTORY:  reports that he quit smoking about 24 years ago. His smoking use included Cigarettes. He has a 50 pack-year smoking history. He has never used smokeless tobacco. He reports that he does not drink alcohol or use illicit drugs.  REVIEW OF SYSTEMS:  Other than that discussed above is noncontributory.  PHYSICAL EXAMINATION: ECOG PERFORMANCE STATUS: 1 - Symptomatic but completely ambulatory  Blood pressure 126/76, pulse 67, temperature 98.4 F (36.9 C), temperature source Oral, resp. rate 16, weight 243 lb 6.4 oz (110.406 kg), SpO2 96.00%.  GENERAL:alert, no distress and comfortable SKIN: skin color, texture, turgor are normal, no rashes or significant lesions EYES: PERLA; Conjunctiva are pink and non-injected, sclera clear SINUSES: No redness or tenderness over maxillary or ethmoid sinuses OROPHARYNX:no exudate, no erythema on lips, buccal mucosa, or tongue. NECK: supple, thyroid normal size, non-tender, without nodularity. No masses CHEST: Increased AP diameter with no breast masses. LYMPH:  no palpable lymphadenopathy in the cervical, axillary or inguinal LUNGS: clear to auscultation and percussion with normal breathing effort HEART: regular rate & rhythm and no murmurs. ABDOMEN:abdomen soft, non-tender and normal bowel sounds. No flank tenderness. MUSCULOSKELETAL:no cyanosis of digits and no clubbing. Range of motion normal.    NEURO: alert & oriented x 3 with fluent speech, no focal motor/sensory deficits   LABORATORY DATA: Lab on 02/01/2014  Component Date Value Ref Range Status  . WBC 02/01/2014 6.6  4.0 - 10.5 K/uL Final  . RBC 02/01/2014 4.90  4.22 - 5.81 MIL/uL Final  . Hemoglobin 02/01/2014 15.2  13.0 - 17.0 g/dL Final  . HCT 02/01/2014 43.5  39.0 - 52.0 % Final  . MCV 02/01/2014 88.8  78.0 - 100.0 fL Final  . MCH 02/01/2014 31.0  26.0 - 34.0 pg Final  . MCHC 02/01/2014 34.9  30.0 - 36.0 g/dL Final  . RDW 02/01/2014 12.6  11.5 - 15.5 % Final  . Platelets 02/01/2014 244  150 - 400 K/uL Final  . Neutrophils Relative % 02/01/2014 62  43 - 77 % Final  . Neutro Abs 02/01/2014 4.0  1.7 - 7.7 K/uL Final  . Lymphocytes Relative 02/01/2014 27  12 - 46 % Final  . Lymphs Abs 02/01/2014 1.8  0.7 - 4.0 K/uL Final  . Monocytes Relative 02/01/2014 6  3 - 12 % Final  . Monocytes Absolute 02/01/2014 0.4  0.1 - 1.0 K/uL Final  . Eosinophils Relative 02/01/2014 4  0 - 5 % Final  . Eosinophils Absolute 02/01/2014 0.3  0.0 - 0.7 K/uL Final  . Basophils Relative 02/01/2014 1  0 - 1 % Final  . Basophils Absolute 02/01/2014 0.1  0.0 -  0.1 K/uL Final  . Sodium 02/01/2014 142  137 - 147 mEq/L Final  . Potassium 02/01/2014 4.3  3.7 - 5.3 mEq/L Final  . Chloride 02/01/2014 104  96 - 112 mEq/L Final  . CO2 02/01/2014 26  19 - 32 mEq/L Final  . Glucose, Bld 02/01/2014 132* 70 - 99 mg/dL Final  . BUN 02/01/2014 12  6 - 23 mg/dL Final  . Creatinine, Ser 02/01/2014 0.97  0.50 - 1.35 mg/dL Final  . Calcium 02/01/2014 9.2  8.4 - 10.5 mg/dL Final  . Total Protein 02/01/2014 7.2  6.0 - 8.3 g/dL Final  . Albumin 02/01/2014 3.8  3.5 - 5.2 g/dL Final  . AST 02/01/2014 37  0 - 37 U/L Final  . ALT 02/01/2014 26  0 - 53 U/L Final  . Alkaline Phosphatase 02/01/2014 65  39 - 117 U/L Final  . Total Bilirubin 02/01/2014 0.6  0.3 - 1.2 mg/dL Final  . GFR calc non Af Amer 02/01/2014 82* >90 mL/min Final  . GFR calc Af Amer 02/01/2014 >90   >90 mL/min Final   Comment: (NOTE)                          The eGFR has been calculated using the CKD EPI equation.                          This calculation has not been validated in all clinical situations.                          eGFR's persistently <90 mL/min signify possible Chronic Kidney                          Disease.  . Anion gap 02/01/2014 12  5 - 15 Final  . CEA 02/01/2014 1.7  0.0 - 5.0 ng/mL Final   Performed at [facility]  . Ferritin 02/01/2014 230  22 - 322 ng/mL Final   Performed at [facility]: No new pathology.  Urinalysis    Component Value Date/Time   COLORURINE YELLOW 10/03/2013 1520   APPEARANCEUR CLEAR 10/03/2013 1520   LABSPEC 1.025 10/03/2013 1520   PHURINE 6.0 10/03/2013 1520   GLUCOSEU NEGATIVE 10/03/2013 1520   HGBUR MODERATE* 10/03/2013 [place] 10/03/2013 [place] 10/03/2013 1520   PROTEINUR 30* 10/03/2013 1520   UROBILINOGEN 0.2 10/03/2013 1520   NITRITE NEGATIVE 10/03/2013 1520   LEUKOCYTESUR MODERATE* 10/03/2013 1520    RADIOGRAPHIC STUDIES: Imaging Review  Ct Abdomen Pelvis W Contrast  10/03/2013 CLINICAL DATA: Abdominal pain, diarrhea EXAM: CT ABDOMEN AND PELVIS WITH CONTRAST TECHNIQUE: Multidetector CT imaging of the abdomen and pelvis was performed using the standard protocol following bolus administration of intravenous contrast. CONTRAST: 11mL OMNIPAQUE IOHEXOL 300 MG/ML SOLN, 24mL OMNIPAQUE IOHEXOL 300 MG/ML SOLN COMPARISON: CT 09/29/2013 FINDINGS: There is again demonstrated moderate left hydronephrosis which is not improved in interval. . There is mild hydroureter on the left secondary to an obstructing calculus within the left vesicoureteral junction. The calculus has migrated 1 cm and now on the vesicular side of the vesicoureteral junction (Image 81, series 2). No right hydronephrosis or hydroureter. Lung bases are clear. No focal hepatic lesions non contrast exam. The gallbladder pancreas  normal. Spleen adrenal glands normal. Stomach, small bowel, colon are unremarkable.  There is anastomosed on the ascending colon. Ventral hernia mesh. Abdominal or is normal caliber. No retroperitoneal periportal adenopathy No free fluid the pelvis. Prostate gland is normal. No pelvic lymphadenopathy. No aggressive osseous lesion. IMPRESSION: 1. Obstructing distal left ureteral calculus has migrated 1 cm in is now at the vesicoureteral junction. The stone is lodged on the vesicular side of the vesicoureteral junction. 2. Persistent hydronephrosis on the left not improved. Electronically Signed By: Suzy Bouchard M.D. On: 10/03/2013 17:07    ASSESSMENT:  #1. Stage II-A adenocarcinoma of the cecum, satus post resection with surgery in 07/07/2011, no postop therapy, no evidence of disease.  #2. Left renal stone, asymptomatic at this time. History of kidney stones in the bladder in 2013.  #3. Barrett's esophagus, on pantoprazole.  #4. Iron deficiency without anemia, probably due to malabsorption of iron. #5. Vasomotor instability with normal liver enzymes and a negative CT of the abdomen in May of 2015.    PLAN:  #1. Testosterone level. #2. Followup in 6 months with CBC, chem profile, CEA, and ferritin   All questions were answered. The patient knows to call the clinic with any problems, questions or concerns. We can certainly see the patient much sooner if necessary.   I spent 25 minutes counseling the patient face to face. The total time spent in the appointment was 30 minutes.    Doroteo Bradford, MD 02/03/2014 10:49 AM  DISCLAIMER:  This note was dictated with voice recognition software.  Similar sounding words can inadvertently be transcribed inaccurately and may not be corrected upon review.

## 2014-02-03 NOTE — Progress Notes (Signed)
William Burns presented for labwork. Labs per MD order drawn via Peripheral Line 23 gauge needle inserted in left AC  Good blood return present. Procedure without incident.  Needle removed intact. Patient tolerated procedure well.

## 2014-02-04 LAB — TESTOSTERONE: TESTOSTERONE: 162 ng/dL — AB (ref 300–890)

## 2014-02-07 ENCOUNTER — Telehealth (HOSPITAL_COMMUNITY): Payer: Self-pay | Admitting: Hematology and Oncology

## 2014-02-07 NOTE — Telephone Encounter (Signed)
Informed patient that testosterone level is low but that testosterone replacement therapy would be contraindicated because of the patient's elevated PSA. Alternative treatment would involve agents that acted on the autonomic nervous system instead of hormone replacement. He was requested to call back if he is interested in trying something like that.

## 2014-02-09 ENCOUNTER — Other Ambulatory Visit (HOSPITAL_COMMUNITY): Payer: Self-pay | Admitting: Hematology and Oncology

## 2014-02-09 MED ORDER — VENLAFAXINE HCL ER 37.5 MG PO CP24: ORAL_CAPSULE | ORAL | Status: DC

## 2014-03-08 DIAGNOSIS — R972 Elevated prostate specific antigen [PSA]: Secondary | ICD-10-CM | POA: Diagnosis not present

## 2014-03-31 DIAGNOSIS — N210 Calculus in bladder: Secondary | ICD-10-CM | POA: Diagnosis not present

## 2014-03-31 DIAGNOSIS — R972 Elevated prostate specific antigen [PSA]: Secondary | ICD-10-CM | POA: Diagnosis not present

## 2014-04-20 ENCOUNTER — Encounter (INDEPENDENT_AMBULATORY_CARE_PROVIDER_SITE_OTHER): Payer: Self-pay | Admitting: *Deleted

## 2014-05-02 DIAGNOSIS — H524 Presbyopia: Secondary | ICD-10-CM | POA: Diagnosis not present

## 2014-05-02 DIAGNOSIS — H52223 Regular astigmatism, bilateral: Secondary | ICD-10-CM | POA: Diagnosis not present

## 2014-05-02 DIAGNOSIS — H11159 Pinguecula, unspecified eye: Secondary | ICD-10-CM | POA: Diagnosis not present

## 2014-05-02 DIAGNOSIS — H5202 Hypermetropia, left eye: Secondary | ICD-10-CM | POA: Diagnosis not present

## 2014-05-15 ENCOUNTER — Other Ambulatory Visit (INDEPENDENT_AMBULATORY_CARE_PROVIDER_SITE_OTHER): Payer: Self-pay | Admitting: Internal Medicine

## 2014-05-15 DIAGNOSIS — K219 Gastro-esophageal reflux disease without esophagitis: Secondary | ICD-10-CM

## 2014-05-15 MED ORDER — PANTOPRAZOLE SODIUM 40 MG PO TBEC: 40.0000 mg | DELAYED_RELEASE_TABLET | Freq: Every day | ORAL | Status: DC

## 2014-06-26 DIAGNOSIS — J209 Acute bronchitis, unspecified: Secondary | ICD-10-CM | POA: Diagnosis not present

## 2014-06-26 DIAGNOSIS — J069 Acute upper respiratory infection, unspecified: Secondary | ICD-10-CM | POA: Diagnosis not present

## 2014-07-05 ENCOUNTER — Encounter (INDEPENDENT_AMBULATORY_CARE_PROVIDER_SITE_OTHER): Payer: Self-pay | Admitting: Internal Medicine

## 2014-07-05 ENCOUNTER — Ambulatory Visit (INDEPENDENT_AMBULATORY_CARE_PROVIDER_SITE_OTHER): Payer: Medicare Other | Admitting: Internal Medicine

## 2014-07-05 VITALS — BP 140/72 | HR 60 | Temp 98.1°F | Ht 75.0 in | Wt 248.9 lb

## 2014-07-05 DIAGNOSIS — C189 Malignant neoplasm of colon, unspecified: Secondary | ICD-10-CM

## 2014-07-05 NOTE — Patient Instructions (Signed)
OV in 1 yr. Surveillance colonoscopy in 2017

## 2014-07-05 NOTE — Progress Notes (Signed)
Subjective:    Patient ID: William Burns, male    DOB: 03-09-1946, 69 y.o.   MRN: [id number]  HPI Here today for f/u. Hx of colon cancer and underwent a rt hemicolectomy by Dr. Arnoldo Morale in 2013.  He tells me he is doing good. His appetite is good. There has been no weight loss. There is no abdominal pain. He usually has a BM x 2 a day. No melena or BRRB. Due for surveillance colonoscopy in 2017. 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 Followed by [place] at [facility]  02/01/2014 CEA  CBC    Component Value Date/Time   WBC 6.6 02/01/2014 0936   RBC 4.90 02/01/2014 0936   RBC 3.26* 07/01/2011 2229   HGB 15.2 02/01/2014 0936   HCT 43.5 02/01/2014 0936   PLT 244 02/01/2014 0936   MCV 88.8 02/01/2014 0936   MCH 31.0 02/01/2014 0936   MCHC 34.9 02/01/2014 0936   RDW 12.6 02/01/2014 0936   LYMPHSABS 1.8 02/01/2014 0936   MONOABS 0.4 02/01/2014 0936   EOSABS 0.3 02/01/2014 0936   BASOSABS 0.1 02/01/2014 0936      09/16/2012 Colonoscopy: Colon Withdrawal Time: 12 minutes  Impression:  Examination performed to ileocolonic anastomosis located in the region of hepatic flexure.  Few small diverticula at sigmoid colon.  5 mm polyp cold snared from rectosigmoid.  Biopsy: serrated adenoma.  Next colonoscopy 3 yrs. (2017).  In 2013 he presented with progressive weakness and a hemoglobin of 7.8. He was found to have a short segment Barrett's esophagus and cecal adenocarcinoma. He underwent a rt hemicolectomy by Dr. Arnoldo Morale. He was evaluated by Dr. Tressie Stalker who recommended no chemotherapy. He is follow by the oncology clinic.     07/03/2011 EGD/Colonoscopy  Impression:  Serrated GE junction with 2 tongues of salmon-colored mucosa. Biopsy taken to rule out short segment Barrett. Erosive gastritis. Large mass at cecum involving most of the ileocecal valve. Wall polyp ablated via cold biopsy from the sigmoid colon and 3 small polyps and rectum were snared submitted in one  container. Few more small rectal polyps accredited using snare tip.  Cecal lesion is an adenocarcinoma and patient already has undergone right hemicolectomy. Polyps from the sigmoid colon and rectum are hyperplastic. Distal esophageal biopsy confirms Barrett's.    Review of Systems Past Medical History  Diagnosis Date  . BPH (benign prostatic hyperplasia)   . Kidney stones 2011  . Tooth infection 07/03/2011  . Colonic mass 07/03/2011  . Erosive gastritis 07/03/2011  . Renal calculus   . Difficult intubation     wife states they were told he had to have an "extra long tube" for intubation, had trouble with this when he had colon resection 07-04-11 Dr Addison Naegeli  . Cataract   . Cancer     colon    Past Surgical History  Procedure Laterality Date  . Hernia repair    . Prostate biopsy    . Esophagogastroduodenoscopy  07/03/2011    Procedure: ESOPHAGOGASTRODUODENOSCOPY (EGD);  Surgeon: Rogene Houston, MD;  [facility];  Service: Endoscopy;  Laterality: N/A;  . Colonoscopy  07/03/2011    Procedure: COLONOSCOPY;  Surgeon: Rogene Houston, MD;  [facility];  Service: Endoscopy;  Laterality: N/A;  . Partial colectomy  07/04/2011    Procedure: PARTIAL COLECTOMY;  Surgeon: Jamesetta So, MD;  Location: [facility];  Service: General;  Laterality: N/A;  . Cystoscopy  07/09/2011    Procedure: CYSTOSCOPY FLEXIBLE;  Surgeon: Marissa Nestle, MD;  Location: [facility];  Service: Urology;  Laterality: N/A;  . Colon surgery    . Transurethral resection of prostate  07/11/2011    Procedure: TRANSURETHRAL RESECTION OF THE PROSTATE (TURP);  Surgeon: Marissa Nestle, MD;  Location: [facility];  Service: Urology;  Laterality: N/A;  . Cystoscopy with litholapaxy  07/11/2011    Procedure: CYSTOSCOPY WITH LITHOLAPAXY;  Surgeon: Marissa Nestle, MD;  Location: [facility];  Service: Urology;  Laterality: N/A;  . Cataract extraction w/phaco Left 08/23/2012    Procedure: CATARACT EXTRACTION PHACO AND  INTRAOCULAR LENS PLACEMENT (IOC);  Surgeon: Tonny Branch, MD;  Location: [facility];  Service: Ophthalmology;  Laterality: Left;  CDE 16.64  . Colonoscopy N/A 09/16/2012    Procedure: COLONOSCOPY;  Surgeon: Rogene Houston, MD;  [facility];  Service: Endoscopy;  Laterality: N/A;  930  . Cataract extraction w/phaco Right 06/09/2013    Procedure: CATARACT EXTRACTION PHACO AND INTRAOCULAR LENS PLACEMENT (IOC) CDE=10.86;  Surgeon: Tonny Branch, MD;  Location: [facility];  Service: Ophthalmology;  Laterality: Right;    No Known Allergies  Current Outpatient Prescriptions on File Prior to Visit  Medication Sig Dispense Refill  . diphenoxylate-atropine (LOMOTIL) 2.5-0.025 MG per tablet Take 1 tablet by mouth 4 (four) times daily as needed for diarrhea or loose stools. 30 tablet 0  . fish oil-omega-3 fatty acids 1000 MG capsule Take 1 g by mouth daily.    Marland Kitchen ibuprofen (ADVIL) 200 MG tablet Take 400 mg by mouth every 6 (six) hours as needed for moderate pain.    . Multiple Vitamin (MULTIVITAMIN WITH MINERALS) TABS Take 1 tablet by mouth daily. MEGA MEN MULTIVITAMIN    . pantoprazole (PROTONIX) 40 MG tablet Take 1 tablet (40 mg total) by mouth daily. 90 tablet 3  . Probiotic CAPS Take 1 capsule by mouth daily. Taking Floragen     No current facility-administered medications on file prior to visit.        Objective:   Physical Exam  Filed Vitals:   07/05/14 1034  Height: 6\' 3"  (1.905 m)  Weight: 248 lb 14.4 oz (112.9 kg)  Alert and oriented. Skin warm and dry. Oral mucosa is moist.   . Sclera anicteric, conjunctivae is pink. Thyroid not enlarged. No cervical lymphadenopathy. Lungs clear. Heart regular rate and rhythm.  Abdomen is soft. Bowel sounds are positive. No hepatomegaly. No abdominal masses felt. No tenderness.  No edema to lower extremities.              Hx of colonic cancer and rt hemicolectomy in 2013. Due for colonoscopy in 2017. No GI symptoms.  Next colonoscopy in April of  2017.

## 2014-07-06 DIAGNOSIS — R972 Elevated prostate specific antigen [PSA]: Secondary | ICD-10-CM | POA: Diagnosis not present

## 2014-07-13 DIAGNOSIS — N210 Calculus in bladder: Secondary | ICD-10-CM | POA: Diagnosis not present

## 2014-07-13 DIAGNOSIS — R972 Elevated prostate specific antigen [PSA]: Secondary | ICD-10-CM | POA: Diagnosis not present

## 2014-08-01 ENCOUNTER — Other Ambulatory Visit (HOSPITAL_COMMUNITY): Payer: Self-pay

## 2014-08-01 DIAGNOSIS — C189 Malignant neoplasm of colon, unspecified: Secondary | ICD-10-CM

## 2014-08-02 ENCOUNTER — Encounter (HOSPITAL_COMMUNITY): Payer: Medicare Other | Attending: Hematology & Oncology

## 2014-08-02 DIAGNOSIS — E611 Iron deficiency: Secondary | ICD-10-CM

## 2014-08-02 DIAGNOSIS — C189 Malignant neoplasm of colon, unspecified: Secondary | ICD-10-CM | POA: Insufficient documentation

## 2014-08-02 LAB — COMPREHENSIVE METABOLIC PANEL
ALT: 26 U/L (ref 0–53)
ANION GAP: 7 (ref 5–15)
AST: 33 U/L (ref 0–37)
Albumin: 3.9 g/dL (ref 3.5–5.2)
Alkaline Phosphatase: 61 U/L (ref 39–117)
BILIRUBIN TOTAL: 0.7 mg/dL (ref 0.3–1.2)
BUN: 15 mg/dL (ref 6–23)
CHLORIDE: 107 mmol/L (ref 96–112)
CO2: 28 mmol/L (ref 19–32)
CREATININE: 0.96 mg/dL (ref 0.50–1.35)
Calcium: 9.1 mg/dL (ref 8.4–10.5)
GFR calc Af Amer: >90 mL/min (ref >90)
GFR, EST NON AFRICAN AMERICAN: 83 mL/min — AB (ref >90)
GLUCOSE: 98 mg/dL (ref 70–99)
Potassium: 4 mmol/L (ref 3.5–5.1)
SODIUM: 142 mmol/L (ref 135–145)
Total Protein: 7.1 g/dL (ref 6.0–8.3)

## 2014-08-02 LAB — CBC WITH DIFFERENTIAL/PLATELET
BASOS ABS: 0.1 10*3/uL (ref 0.0–0.1)
Basophils Relative: 1 % (ref 0–1)
Eosinophils Absolute: 0.2 10*3/uL (ref 0.0–0.7)
Eosinophils Relative: 3 % (ref 0–5)
HEMATOCRIT: 43.3 % (ref 39.0–52.0)
HEMOGLOBIN: 14.6 g/dL (ref 13.0–17.0)
LYMPHS ABS: 2.2 10*3/uL (ref 0.7–4.0)
LYMPHS PCT: 28 % (ref 12–46)
MCH: 30 pg (ref 26.0–34.0)
MCHC: 33.7 g/dL (ref 30.0–36.0)
MCV: 88.9 fL (ref 78.0–100.0)
Monocytes Absolute: 0.5 10*3/uL (ref 0.1–1.0)
Monocytes Relative: 7 % (ref 3–12)
NEUTROS PCT: 61 % (ref 43–77)
Neutro Abs: 4.7 10*3/uL (ref 1.7–7.7)
Platelets: 246 10*3/uL (ref 150–400)
RBC: 4.87 MIL/uL (ref 4.22–5.81)
RDW: 13.3 % (ref 11.5–15.5)
WBC: 7.7 10*3/uL (ref 4.0–10.5)

## 2014-08-02 LAB — FERRITIN: Ferritin: 238 ng/mL (ref 22–322)

## 2014-08-02 NOTE — Progress Notes (Signed)
LABS FOR FERR,CEA,CMP,CBCD 

## 2014-08-03 DIAGNOSIS — C61 Malignant neoplasm of prostate: Secondary | ICD-10-CM | POA: Diagnosis not present

## 2014-08-03 DIAGNOSIS — R972 Elevated prostate specific antigen [PSA]: Secondary | ICD-10-CM | POA: Diagnosis not present

## 2014-08-03 LAB — CEA: CEA: 3.2 ng/mL (ref 0.0–4.7)

## 2014-08-04 ENCOUNTER — Ambulatory Visit (HOSPITAL_COMMUNITY): Payer: Medicare Other | Admitting: Hematology & Oncology

## 2014-08-07 ENCOUNTER — Encounter (HOSPITAL_BASED_OUTPATIENT_CLINIC_OR_DEPARTMENT_OTHER): Payer: Medicare Other | Admitting: Hematology & Oncology

## 2014-08-07 ENCOUNTER — Encounter (HOSPITAL_COMMUNITY): Payer: Self-pay | Admitting: Hematology & Oncology

## 2014-08-07 VITALS — BP 151/73 | HR 74 | Temp 98.2°F | Resp 20 | Wt 247.2 lb

## 2014-08-07 DIAGNOSIS — C189 Malignant neoplasm of colon, unspecified: Secondary | ICD-10-CM

## 2014-08-07 DIAGNOSIS — Z85038 Personal history of other malignant neoplasm of large intestine: Secondary | ICD-10-CM

## 2014-08-07 DIAGNOSIS — R109 Unspecified abdominal pain: Secondary | ICD-10-CM

## 2014-08-07 NOTE — Patient Instructions (Signed)
[place] at [facility]  Discharge Instructions:  Follow up with lab work in 3 months.  Follow up with lab work and doctors appt in 6 moths.  If you have any questions or concerns please call the clinic before then _______________________________________________________________  Thank you for choosing [place] at [facility] to provide your oncology and hematology care.  To afford each patient quality time with our providers, please arrive at least 15 minutes before your scheduled appointment.  You need to re-schedule your appointment if you arrive 10 or more minutes late.  We strive to give you quality time with our providers, and arriving late affects you and other patients whose appointments are after yours.  Also, if you no show three or more times for appointments you may be dismissed from the clinic.  Again, thank you for choosing [place] at [place] hope is that these requests will allow you access to exceptional care and in a timely manner. _______________________________________________________________  If you have questions after your visit, please contact our office at [phone] between the hours of 8:30 a.m. and 5:00 p.m. Voicemails left after 4:30 p.m. will not be returned until the following business day. _______________________________________________________________  For prescription refill requests, have your pharmacy contact our office. _______________________________________________________________  Recommendations made by the consultant and any test results will be sent to your referring physician. _______________________________________________________________

## 2014-08-07 NOTE — Progress Notes (Signed)
William Cahill, MD  [place] [address]    DIAGNOSIS:  Stage II-A carcinoma of the cecum   R hemicolectomy on 07/07/2011, no additional therapy   Colonoscopy on 07/2012 with sessile adenoma    CURRENT THERAPY: Observation  INTERVAL HISTORY: William Burns 69 y.o. male returns for follow-up of a stage IIa carcinoma of the cecum. He underwent a right hemicolectomy in February 2013. He has had problems with kidney stones and follows with a urologist. Unfortunately he reports his PSA was elevated and he has just undergone multiple biopsies. He has not received the results of these biopsies yet. He is extremely active. He is a Psychologist, sport and exercise. His appetite is good and his energy level is good.  He reports some mild abdominal discomfort superior to his incision site. He states this is been there now for several weeks. He denies any accompanying nausea, vomiting, or change in his bowel habits. Because it is new it concerns him some.  MEDICAL HISTORY: Past Medical History  Diagnosis Date  . BPH (benign prostatic hyperplasia)   . Kidney stones 2011  . Tooth infection 07/03/2011  . Colonic mass 07/03/2011  . Erosive gastritis 07/03/2011  . Renal calculus   . Difficult intubation     wife states they were told he had to have an "extra long tube" for intubation, had trouble with this when he had colon resection 07-04-11 Dr William Burns  . Cataract   . Cancer     colon    has Microcytic hypochromic anemia; Hx of colonic polyp; Hyperglycemia; BPH (benign prostatic hyperplasia); Tooth infection; Adenocarcinoma of colon; Erosive gastritis; Calculus, bladder; Short-segment Barrett's esophagus; Diarrhea; and Kidney stone on left side on his problem list.     has No Known Allergies.  William Burns does not currently have medications on file.  SURGICAL HISTORY: Past Surgical History  Procedure Laterality Date  . Hernia repair    . Prostate biopsy    . Esophagogastroduodenoscopy  07/03/2011   Procedure: ESOPHAGOGASTRODUODENOSCOPY (EGD);  Surgeon: Rogene Houston, MD;  [facility];  Service: Endoscopy;  Laterality: N/A;  . Colonoscopy  07/03/2011    Procedure: COLONOSCOPY;  Surgeon: Rogene Houston, MD;  [facility];  Service: Endoscopy;  Laterality: N/A;  . Partial colectomy  07/04/2011    Procedure: PARTIAL COLECTOMY;  Surgeon: Jamesetta So, MD;  [facility];  Service: General;  Laterality: N/A;  . Cystoscopy  07/09/2011    Procedure: Erlene Quan;  Surgeon: Marissa Nestle, MD;  [facility]: [facility];  Service: Urology;  Laterality: N/A;  . Colon surgery    . Transurethral resection of prostate  07/11/2011    Procedure: TRANSURETHRAL RESECTION OF THE PROSTATE (TURP);  Surgeon: Marissa Nestle, MD;  [facility];  Service: Urology;  Laterality: N/A;  . Cystoscopy with litholapaxy  07/11/2011    Procedure: CYSTOSCOPY WITH LITHOLAPAXY;  Surgeon: Marissa Nestle, MD;  [facility];  Service: Urology;  Laterality: N/A;  . Cataract extraction w/phaco Left 08/23/2012    Procedure: CATARACT EXTRACTION PHACO AND INTRAOCULAR LENS PLACEMENT (IOC);  Surgeon: Tonny Branch, MD;  [facility];  Service: Ophthalmology;  Laterality: Left;  CDE 16.64  . Colonoscopy N/A 09/16/2012    Procedure: COLONOSCOPY;  Surgeon: Rogene Houston, MD;  [facility];  Service: Endoscopy;  Laterality: N/A;  930  . Cataract extraction w/phaco Right 06/09/2013    Procedure: CATARACT EXTRACTION PHACO AND  INTRAOCULAR LENS PLACEMENT (IOC) CDE=10.86;  Surgeon: Tonny Branch, MD;  [facility];  Service: Ophthalmology;  Laterality: Right;    SOCIAL HISTORY: History   Social History  . Marital Status: Married    Spouse Name: N/A  . Number of Children: N/A  . Years of Education: N/A   Occupational History  . farmer     tobacco; cattle; Sales executive   Social History Main Topics  . Smoking status: Former Smoker -- 2.00 packs/day for 25 years    Types: Cigarettes    Quit  date: 06/02/1989  . Smokeless tobacco: Never Used  . Alcohol Use: No  . Drug Use: No  . Sexual Activity: Not Currently   Other Topics Concern  . Not on file   Social History Narrative    FAMILY HISTORY: Family History  Problem Relation Age of Onset  . Alzheimer's disease Mother   . Alzheimer's disease Father   . Aneurysm Brother     brain  . Anuerysm Brother   . Colon cancer Neg Hx   . Healthy Son   . Healthy Son     Review of Systems  Constitutional: Negative for fever, chills, weight loss and malaise/fatigue.  HENT: Negative for congestion, hearing loss, nosebleeds, sore throat and tinnitus.   Eyes: Negative for blurred vision, double vision, pain and discharge.  Respiratory: Negative for cough, hemoptysis, sputum production, shortness of breath and wheezing.   Cardiovascular: Negative for chest pain, palpitations, claudication, leg swelling and PND.  Gastrointestinal: Negative for heartburn, nausea, vomiting, abdominal pain, diarrhea, constipation, blood in stool and melena.       See HPI for abdominal discomfort  Genitourinary: Negative for dysuria, urgency, frequency and hematuria.  Musculoskeletal: Negative for myalgias, joint pain and falls.  Skin: Negative for itching and rash.  Neurological: Negative for dizziness, tingling, tremors, sensory change, speech change, focal weakness, seizures, loss of consciousness, weakness and headaches.  Endo/Heme/Allergies: Does not bruise/bleed easily.  Psychiatric/Behavioral: Negative for depression, suicidal ideas, memory loss and substance abuse. The patient is not nervous/anxious and does not have insomnia.     PHYSICAL EXAMINATION  ECOG PERFORMANCE STATUS: 0 - Asymptomatic  Filed Vitals:   08/07/14 1200  BP: 151/73  Pulse: 74  Temp: 98.2 F (36.8 C)  Resp: 20    Physical Exam  Constitutional: He is oriented to person, place, and time and well-developed, well-nourished, and in no distress.  HENT:  Head:  Normocephalic and atraumatic.  Nose: Nose normal.  Mouth/Throat: Oropharynx is clear and moist. No oropharyngeal exudate.  Eyes: Conjunctivae and EOM are normal. Pupils are equal, round, and reactive to light. Right eye exhibits no discharge. Left eye exhibits no discharge. No scleral icterus.  Neck: Normal range of motion. Neck supple. No tracheal deviation present. No thyromegaly present.  Cardiovascular: Normal rate, regular rhythm and normal heart sounds.  Exam reveals no gallop and no friction rub.   No murmur heard. Pulmonary/Chest: Effort normal and breath sounds normal. He has no wheezes. He has no rales.  Abdominal: Soft. Bowel sounds are normal. He exhibits no distension and no mass. There is no tenderness. There is no rebound and no guarding.  No evidence of an incisional hernia, no rebound or guarding on exam  Musculoskeletal: Normal range of motion. He exhibits no edema.  Lymphadenopathy:    He has no cervical adenopathy.  Neurological: He is alert and oriented to person, place, and time. He has normal reflexes. No cranial nerve deficit. Gait normal. Coordination normal.  Skin: Skin is warm and dry. No rash noted.  Psychiatric: Mood, memory, affect and judgment normal.  Nursing note and vitals reviewed.   LABORATORY DATA:  CBC    Component Value Date/Time   WBC 7.7 08/02/2014 0903   RBC 4.87 08/02/2014 0903   RBC 3.26* 07/01/2011 2229   HGB 14.6 08/02/2014 0903   HCT 43.3 08/02/2014 0903   PLT 246 08/02/2014 0903   MCV 88.9 08/02/2014 0903   MCH 30.0 08/02/2014 0903   MCHC 33.7 08/02/2014 0903   RDW 13.3 08/02/2014 0903   LYMPHSABS 2.2 08/02/2014 0903   MONOABS 0.5 08/02/2014 0903   EOSABS 0.2 08/02/2014 0903   BASOSABS 0.1 08/02/2014 0903   CMP     Component Value Date/Time   NA 142 08/02/2014 0903   K 4.0 08/02/2014 0903   CL 107 08/02/2014 0903   CO2 28 08/02/2014 0903   GLUCOSE 98 08/02/2014 0903   BUN 15 08/02/2014 0903   CREATININE 0.96 08/02/2014  0903   CALCIUM 9.1 08/02/2014 0903   PROT 7.1 08/02/2014 0903   ALBUMIN 3.9 08/02/2014 0903   AST 33 08/02/2014 0903   ALT 26 08/02/2014 0903   ALKPHOS 61 08/02/2014 0903   BILITOT 0.7 08/02/2014 0903   GFRNONAA 83* 08/02/2014 0903   GFRAA >90 08/02/2014 0903     ASSESSMENT and THERAPY PLAN:   Stage IIA Colorectal Cancer  69 year old male with a history of stage II colon cancer. Laboratory studies were reviewed with the patient and were excellent. CEA was normal however has doubled from his prior values and I have recommended another CEA level in 3 months to which he agrees. In regards to his colon cancer we will continue with observation with follow-up in 6 months.  Abdominal discomfort  I advised the patient that if his abdominal discomfort persists or worsens that he is to call and we will arrange for CT imaging of the abdomen. I advised him some of his discomfort may be related to adhesion formation however given his history I have recommended CT imaging for persistent pain.  All questions were answered. The patient knows to call the clinic with any problems, questions or concerns. We can certainly see the patient much sooner if necessary. This note was electronically signed. Molli Hazard 08/07/2014

## 2014-08-17 DIAGNOSIS — C61 Malignant neoplasm of prostate: Secondary | ICD-10-CM | POA: Diagnosis not present

## 2014-08-18 ENCOUNTER — Other Ambulatory Visit (HOSPITAL_COMMUNITY): Payer: Self-pay | Admitting: Urology

## 2014-08-18 DIAGNOSIS — C61 Malignant neoplasm of prostate: Secondary | ICD-10-CM

## 2014-08-23 ENCOUNTER — Encounter (INDEPENDENT_AMBULATORY_CARE_PROVIDER_SITE_OTHER): Payer: Self-pay | Admitting: *Deleted

## 2014-08-29 ENCOUNTER — Ambulatory Visit (INDEPENDENT_AMBULATORY_CARE_PROVIDER_SITE_OTHER): Payer: Medicare Other | Admitting: Internal Medicine

## 2014-08-29 ENCOUNTER — Encounter (INDEPENDENT_AMBULATORY_CARE_PROVIDER_SITE_OTHER): Payer: Self-pay | Admitting: Internal Medicine

## 2014-08-29 DIAGNOSIS — C61 Malignant neoplasm of prostate: Secondary | ICD-10-CM | POA: Insufficient documentation

## 2014-08-29 DIAGNOSIS — R110 Nausea: Secondary | ICD-10-CM | POA: Diagnosis not present

## 2014-08-29 MED ORDER — ONDANSETRON HCL 4 MG PO TABS: 4.0000 mg | ORAL_TABLET | Freq: Three times a day (TID) | ORAL | Status: DC | PRN

## 2014-08-29 NOTE — Progress Notes (Signed)
Subjective:    Patient ID: William Burns, male    DOB: 09-Mar-1946, 69 y.o.   MRN: [id number]  HPI Presents today with c/o nausea. He says he has had nausea. He says he will have nausea and 5 minutes later it will be gone. He has not had nausea in 3-4 days. He has had the nausea for a month but did not mention this at [facility] in February. . . He is not having any acid reflux. He tells me he has been recently diagnosed with prostate cancer. He has had a lot on his mind. Sometimes he becomes nauseated thinking about this.  Appetite is good. No weight loss.  The nausea is not necessarily related to eating.  He occasionally has rt lower abdominal pain at the incision site.  Last seen in February for f/u.   Hx of colon cancer and underwent a rt hemicolectomy by Dr. Arnoldo Morale in 2013.  Recent new diagnosis of prostate cancer and is still being evaluated by [facility].  Scheduled for a MRI prostage 09/2014.   08/02/2014 CEA 3.2.  CBC    Component Value Date/Time   WBC 7.7 08/02/2014 0903   RBC 4.87 08/02/2014 0903   RBC 3.26* 07/01/2011 2229   HGB 14.6 08/02/2014 0903   HCT 43.3 08/02/2014 0903   PLT 246 08/02/2014 0903   MCV 88.9 08/02/2014 0903   MCH 30.0 08/02/2014 0903   MCHC 33.7 08/02/2014 0903   RDW 13.3 08/02/2014 0903   LYMPHSABS 2.2 08/02/2014 0903   MONOABS 0.5 08/02/2014 0903   EOSABS 0.2 08/02/2014 0903   BASOSABS 0.1 08/02/2014 0903    CMP Latest Ref Rng 08/02/2014 02/01/2014 10/03/2013  Glucose 70 - 99 mg/dL 98 132(H) 116(H)  BUN 6 - 23 mg/dL 15 12 18   Creatinine 0.50 - 1.35 mg/dL 0.96 0.97 1.52(H)  Sodium 135 - 145 mmol/L 142 142 138  Potassium 3.5 - 5.1 mmol/L 4.0 4.3 4.2  Chloride 96 - 112 mmol/L 107 104 97  CO2 19 - 32 mmol/L 28 26 28   Calcium 8.4 - 10.5 mg/dL 9.1 9.2 9.2  Total Protein 6.0 - 8.3 g/dL 7.1 7.2 7.4  Total Bilirubin 0.3 - 1.2 mg/dL 0.7 0.6 0.5  Alkaline Phos 39 - 117 U/L 61 65 74  AST 0 - 37 U/L 33 37 27  ALT 0 - 53 U/L 26 26 27       09/16/2012  Colonoscopy: Colon Withdrawal Time: 12 minutes  Impression:  Examination performed to ileocolonic anastomosis located in the region of hepatic flexure.  Few small diverticula at sigmoid colon.  5 mm polyp cold snared from rectosigmoid.  Biopsy: serrated adenoma.  Next colonoscopy 3 yrs. (2017).     07/03/2011 EGD/Colonoscopy:    Indications: Patient is 69 year old Caucasian male who presents with microcytic anemia and causes significant history of melena and he has been on low-dose ASA. Has history of colonic polyps and his last exam was over 20 years ago. Impression:  Serrated GE junction with 2 tongues of salmon-colored mucosa. Biopsy taken to rule out short segment Barrett. Erosive gastritis. Large mass at cecum involving most of the ileocecal valve. Wall polyp ablated via cold biopsy from the sigmoid colon and 3 small polyps and rectum were snared submitted in one container. Few more small rectal polyps accredited using snare tip.    02/01/2014 CEA  Review of Systems Past Medical History  Diagnosis Date  . BPH (benign prostatic hyperplasia)   . Kidney stones 2011  .  Tooth infection 07/03/2011  . Colonic mass 07/03/2011  . Erosive gastritis 07/03/2011  . Renal calculus   . Difficult intubation     wife states they were told he had to have an "extra long tube" for intubation, had trouble with this when he had colon resection 07-04-11 Dr Addison Naegeli  . Cataract   . Cancer     colon    Past Surgical History  Procedure Laterality Date  . Hernia repair    . Prostate biopsy    . Esophagogastroduodenoscopy  07/03/2011    Procedure: ESOPHAGOGASTRODUODENOSCOPY (EGD);  Surgeon: Rogene Houston, MD;  [facility];  Service: Endoscopy;  Laterality: N/A;  . Colonoscopy  07/03/2011    Procedure: COLONOSCOPY;  Surgeon: Rogene Houston, MD;  [facility];   Service: Endoscopy;  Laterality: N/A;  . Partial colectomy  07/04/2011    Procedure: PARTIAL COLECTOMY;  Surgeon: Jamesetta So, MD;  [facility];  Service: General;  Laterality: N/A;  . Cystoscopy  07/09/2011    Procedure: Erlene Quan;  Surgeon: Marissa Nestle, MD;  [facility];  Service: Urology;  Laterality: N/A;  . Colon surgery    . Transurethral resection of prostate  07/11/2011    Procedure: TRANSURETHRAL RESECTION OF THE PROSTATE (TURP);  Surgeon: Marissa Nestle, MD;  [facility];  Service: Urology;  Laterality: N/A;  . Cystoscopy with litholapaxy  07/11/2011    Procedure: CYSTOSCOPY WITH LITHOLAPAXY;  Surgeon: Marissa Nestle, MD;  [facility];  Service: Urology;  Laterality: N/A;  . Cataract extraction w/phaco Left 08/23/2012    Procedure: CATARACT EXTRACTION PHACO AND INTRAOCULAR LENS PLACEMENT (IOC);  Surgeon: Tonny Branch, MD;  [facility];  Service: Ophthalmology;  Laterality: Left;  CDE 16.64  . Colonoscopy N/A 09/16/2012    Procedure: COLONOSCOPY;  Surgeon: Rogene Houston, MD;  [facility];  Service: Endoscopy;  Laterality: N/A;  930  . Cataract extraction w/phaco Right 06/09/2013    Procedure: CATARACT EXTRACTION PHACO AND INTRAOCULAR LENS PLACEMENT (IOC) CDE=10.86;  Surgeon: Tonny Branch, MD;  [facility];  Service: Ophthalmology;  Laterality: Right;  . Prostage cancer      No Known Allergies  Current Outpatient Prescriptions on File Prior to Visit  Medication Sig Dispense Refill  . diphenoxylate-atropine (LOMOTIL) 2.5-0.025 MG per tablet Take 1 tablet by mouth 4 (four) times daily as needed for diarrhea or loose stools. 30 tablet 0  . fish oil-omega-3 fatty acids 1000 MG capsule Take 1 g by mouth daily. None for 2 weeks bc had biopsy on prostate    . pantoprazole (PROTONIX) 40 MG tablet Take 1 tablet (40 mg total) by mouth daily. 90 tablet 3  . Probiotic CAPS Take 1 capsule by mouth daily. Taking Floragen     No current  facility-administered medications on file prior to visit.        Objective:   Physical Exam Blood pressure 136/58, pulse 64, temperature 97 F (36.1 C), height 6\' 1"  (1.854 m), weight 246 lb 12.8 oz (111.948 kg).  Alert and oriented. Skin warm and dry. Oral mucosa is moist.   . Sclera anicteric, conjunctivae is pink. Thyroid not enlarged. No cervical lymphadenopathy. Lungs clear. Heart regular rate and rhythm.  Abdomen is soft. Bowel sounds are positive. No hepatomegaly. No abdominal masses felt. No tenderness.  No edema to lower extremities.         Assessment & Plan:  Nausea: Amy going to try him  on Zofran and see how he does. I think his nausea is related to the stress he is under from the new diagnosis of prostate cancer. Wife will call with a progress report in 2 weeks.

## 2014-08-29 NOTE — Patient Instructions (Signed)
Zofran eprescribed.

## 2014-09-11 ENCOUNTER — Ambulatory Visit (HOSPITAL_COMMUNITY)
Admission: RE | Admit: 2014-09-11 | Discharge: 2014-09-11 | Disposition: A | Discharge status: Home / Self Care | Payer: Medicare Other | Source: Ambulatory Visit | Attending: Urology | Admitting: Urology

## 2014-09-11 DIAGNOSIS — R972 Elevated prostate specific antigen [PSA]: Secondary | ICD-10-CM | POA: Diagnosis not present

## 2014-09-11 DIAGNOSIS — C61 Malignant neoplasm of prostate: Secondary | ICD-10-CM | POA: Insufficient documentation

## 2014-09-11 MED ORDER — GADOBENATE DIMEGLUMINE 529 MG/ML IV SOLN
20.0000 mL | Freq: Once | INTRAVENOUS | Status: AC | PRN
  Administered 2014-09-11: 20 mL via INTRAVENOUS

## 2014-09-18 DIAGNOSIS — C61 Malignant neoplasm of prostate: Secondary | ICD-10-CM | POA: Diagnosis not present

## 2014-11-01 ENCOUNTER — Other Ambulatory Visit: Payer: Self-pay | Admitting: Urology

## 2014-11-07 ENCOUNTER — Encounter (HOSPITAL_COMMUNITY): Payer: Medicare Other | Attending: Hematology & Oncology

## 2014-11-07 DIAGNOSIS — Z85038 Personal history of other malignant neoplasm of large intestine: Secondary | ICD-10-CM | POA: Diagnosis present

## 2014-11-07 DIAGNOSIS — C189 Malignant neoplasm of colon, unspecified: Secondary | ICD-10-CM | POA: Diagnosis not present

## 2014-11-07 NOTE — Progress Notes (Signed)
LABS DRAWN

## 2014-11-08 LAB — CEA: CEA: 3.1 ng/mL (ref 0.0–4.7)

## 2014-11-14 DIAGNOSIS — C61 Malignant neoplasm of prostate: Secondary | ICD-10-CM | POA: Diagnosis not present

## 2014-11-16 DIAGNOSIS — D075 Carcinoma in situ of prostate: Secondary | ICD-10-CM | POA: Diagnosis not present

## 2014-11-16 DIAGNOSIS — G3184 Mild cognitive impairment, so stated: Secondary | ICD-10-CM | POA: Diagnosis not present

## 2014-11-16 DIAGNOSIS — Z79899 Other long term (current) drug therapy: Secondary | ICD-10-CM | POA: Diagnosis not present

## 2014-11-16 DIAGNOSIS — G4710 Hypersomnia, unspecified: Secondary | ICD-10-CM | POA: Diagnosis not present

## 2014-11-16 DIAGNOSIS — D010 Carcinoma in situ of colon: Secondary | ICD-10-CM | POA: Diagnosis not present

## 2014-11-27 NOTE — Telephone Encounter (Signed)
This encounter was created in error - please disregard.

## 2014-11-28 DIAGNOSIS — M6281 Muscle weakness (generalized): Secondary | ICD-10-CM | POA: Diagnosis not present

## 2014-11-28 DIAGNOSIS — C61 Malignant neoplasm of prostate: Secondary | ICD-10-CM | POA: Diagnosis not present

## 2014-11-29 ENCOUNTER — Encounter (INDEPENDENT_AMBULATORY_CARE_PROVIDER_SITE_OTHER): Payer: Self-pay | Admitting: Internal Medicine

## 2014-11-29 ENCOUNTER — Ambulatory Visit (INDEPENDENT_AMBULATORY_CARE_PROVIDER_SITE_OTHER): Payer: Medicare Other | Admitting: Internal Medicine

## 2014-11-29 VITALS — BP 118/70 | HR 72 | Temp 98.4°F | Ht 73.0 in | Wt 252.5 lb

## 2014-11-29 DIAGNOSIS — Z85038 Personal history of other malignant neoplasm of large intestine: Secondary | ICD-10-CM | POA: Diagnosis not present

## 2014-11-29 DIAGNOSIS — R748 Abnormal levels of other serum enzymes: Secondary | ICD-10-CM | POA: Diagnosis not present

## 2014-11-29 LAB — HEPATIC FUNCTION PANEL
ALK PHOS: 64 U/L (ref 39–117)
ALT: 33 U/L (ref 0–53)
AST: 37 U/L (ref 0–37)
Albumin: 3.9 g/dL (ref 3.5–5.2)
BILIRUBIN INDIRECT: 0.5 mg/dL (ref 0.2–1.2)
Bilirubin, Direct: 0.2 mg/dL (ref 0.0–0.3)
TOTAL PROTEIN: 6.7 g/dL (ref 6.0–8.3)
Total Bilirubin: 0.7 mg/dL (ref 0.2–1.2)

## 2014-11-29 NOTE — Patient Instructions (Addendum)
OV in 6 months. Hepatic function,

## 2014-11-29 NOTE — Progress Notes (Addendum)
Subjective:    Patient ID: William Burns, male    DOB: 13-Nov-1945, 69 y.o.   MRN: [id number]  HPI Here today for f/u. Hx of colon cancer and underwent a rt hemicolectomy in 2013 by Dr. Arnoldo Morale. Last seen in February of this year.   Due for surveillance colonoscopy in 2017. 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 Hx of prostate Cancer and will have surgery in August by Dr. Louis Meckel in [facility]) Followed by [facility] at [facility] He tells me he is doing okay. Sometimes he has tenderness in his mid abdomen occasionally.  His wife say when he eats he will go to the [place] and have a BM. He says the Probiotic has helped. ' He usually has a BM about two a day. No melena or BRRB. Appetite is good. He has gained 4.5pounds since his last visit.  11/16/2014 H and H 15.0 and 43.1, MCV 87.1, AST 44, ALT 37, ALP 65.  CBC    Component Value Date/Time   WBC 7.7 08/02/2014 0903   RBC 4.87 08/02/2014 0903   RBC 3.26* 07/01/2011 2229   HGB 14.6 08/02/2014 0903   HCT 43.3 08/02/2014 0903   PLT 246 08/02/2014 0903   MCV 88.9 08/02/2014 0903   MCH 30.0 08/02/2014 0903   MCHC 33.7 08/02/2014 0903   RDW 13.3 08/02/2014 0903   LYMPHSABS 2.2 08/02/2014 0903   MONOABS 0.5 08/02/2014 0903   EOSABS 0.2 08/02/2014 0903   BASOSABS 0.1 08/02/2014 0903    Hepatic Function Panel     Component Value Date/Time   PROT 7.1 08/02/2014 0903   ALBUMIN 3.9 08/02/2014 0903   AST 33 08/02/2014 0903   ALT 26 08/02/2014 0903   ALKPHOS 61 08/02/2014 0903   BILITOT 0.7 08/02/2014 0903       09/16/2012 Colonoscopy: Colon Withdrawal Time: 12 minutes  Impression:  Examination performed to ileocolonic anastomosis located in the region of hepatic flexure.  Few small diverticula at sigmoid colon.  5 mm polyp cold snared from rectosigmoid.  Biopsy: serrated adenoma.  Next colonoscopy 3 yrs. (2017).   Review of Systems Past Medical History  Diagnosis Date  . BPH (benign prostatic hyperplasia)     . Kidney stones 2011  . Tooth infection 07/03/2011  . Colonic mass 07/03/2011  . Erosive gastritis 07/03/2011  . Renal calculus   . Difficult intubation     wife states they were told he had to have an "extra long tube" for intubation, had trouble with this when he had colon resection 07-04-11 Dr Addison Naegeli  . Cataract   . Cancer     colon    Past Surgical History  Procedure Laterality Date  . Hernia repair    . Prostate biopsy    . Esophagogastroduodenoscopy  07/03/2011    Procedure: ESOPHAGOGASTRODUODENOSCOPY (EGD);  Surgeon: Rogene Houston, MD;  [facility];  Service: Endoscopy;  Laterality: N/A;  . Colonoscopy  07/03/2011    Procedure: COLONOSCOPY;  Surgeon: Rogene Houston, MD;  [facility];  Service: Endoscopy;  Laterality: N/A;  . Partial colectomy  07/04/2011    Procedure: PARTIAL COLECTOMY;  Surgeon: Jamesetta So, MD;  [facility];  Service: General;  Laterality: N/A;  . Cystoscopy  07/09/2011    Procedure: Erlene Quan;  Surgeon: Marissa Nestle, MD;  [facility];  Service: Urology;  Laterality: N/A;  . Colon surgery    . Transurethral resection of prostate  07/11/2011  Procedure: TRANSURETHRAL RESECTION OF THE PROSTATE (TURP);  Surgeon: Marissa Nestle, MD;  [facility];  Service: Urology;  Laterality: N/A;  . Cystoscopy with litholapaxy  07/11/2011    Procedure: CYSTOSCOPY WITH LITHOLAPAXY;  Surgeon: Marissa Nestle, MD;  [facility];  Service: Urology;  Laterality: N/A;  . Cataract extraction w/phaco Left 08/23/2012    Procedure: CATARACT EXTRACTION PHACO AND INTRAOCULAR LENS PLACEMENT (IOC);  Surgeon: Tonny Branch, MD;  [facility];  Service: Ophthalmology;  Laterality: Left;  CDE 16.64  . Colonoscopy N/A 09/16/2012    Procedure: COLONOSCOPY;  Surgeon: Rogene Houston, MD;  [facility];  Service: Endoscopy;  Laterality: N/A;  930  . Cataract extraction w/phaco Right 06/09/2013    Procedure: CATARACT EXTRACTION  PHACO AND INTRAOCULAR LENS PLACEMENT (IOC) CDE=10.86;  Surgeon: Tonny Branch, MD;  [facility];  Service: Ophthalmology;  Laterality: Right;  . Prostage cancer      No Known Allergies  Current Outpatient Prescriptions on File Prior to Visit  Medication Sig Dispense Refill  . diphenoxylate-atropine (LOMOTIL) 2.5-0.025 MG per tablet Take 1 tablet by mouth 4 (four) times daily as needed for diarrhea or loose stools. 30 tablet 0  . fish oil-omega-3 fatty acids 1000 MG capsule Take 1 g by mouth daily. None for 2 weeks bc had biopsy on prostate    . Multiple Vitamins-Minerals (EQL MEGA SELECT MENS PO) Take by mouth.    . pantoprazole (PROTONIX) 40 MG tablet Take 1 tablet (40 mg total) by mouth daily. 90 tablet 3  . Probiotic CAPS Take 1 capsule by mouth daily. Taking Floragen     No current facility-administered medications on file prior to visit.        Objective:   Physical Exam Blood pressure 118/70, pulse 72, temperature 98.4 F (36.9 C), height 6\' 1"  (1.854 m), weight 252 lb 8 oz (114.533 kg). Alert and oriented. Skin warm and dry. Oral mucosa is moist.   . Sclera anicteric, conjunctivae is pink. Thyroid not enlarged. No cervical lymphadenopathy. Lungs clear. Heart regular rate and rhythm.  Abdomen is soft. Bowel sounds are positive. No hepatomegaly. No abdominal masses felt. No tenderness.  No edema to lower extremities.  Stool brown and guaiac negative.   Developer: [id number] ( 04/2018) Card: Lot [id number] (12/18)    Assessment & Plan:  Hx of colon cancer. Seems to be doing well. Will get a Hepatic Function. OV in 6 months.

## 2014-12-11 DIAGNOSIS — M6281 Muscle weakness (generalized): Secondary | ICD-10-CM | POA: Diagnosis not present

## 2014-12-11 DIAGNOSIS — C61 Malignant neoplasm of prostate: Secondary | ICD-10-CM | POA: Diagnosis not present

## 2014-12-13 ENCOUNTER — Encounter (INDEPENDENT_AMBULATORY_CARE_PROVIDER_SITE_OTHER): Payer: Self-pay

## 2014-12-19 DIAGNOSIS — R278 Other lack of coordination: Secondary | ICD-10-CM | POA: Diagnosis not present

## 2014-12-19 DIAGNOSIS — C61 Malignant neoplasm of prostate: Secondary | ICD-10-CM | POA: Diagnosis not present

## 2014-12-19 DIAGNOSIS — M6281 Muscle weakness (generalized): Secondary | ICD-10-CM | POA: Diagnosis not present

## 2015-01-15 NOTE — Patient Instructions (Signed)
William Burns  01/15/2015   Your procedure is scheduled on:    01/19/2015    Report to [facility]  Entrance take [place]  elevators to 3rd floor to  [place] at      [phone] AM.  Call this number if you have problems the morning of surgery [phone]   Remember: ONLY 1 PERSON MAY GO WITH YOU TO SHORT STAY TO GET  READY MORNING OF [place].  Do not eat food or drink liquids :After Midnight.     Take these medicines the morning of surgery with A SIP OF WATER: protonix, Refresh eye drops if needed                                You may not have any metal on your body including hair pins and              piercings  Do not wear jewelry, , lotions, powders or perfumes, deodorant                          Men may shave face and neck.   Do not bring valuables to the hospital. William Burns.  Contacts, dentures or bridgework may not be worn into surgery.  Leave suitcase in the car. After surgery it may be brought to your room.         Special Instructions: coughing and deep breathing exercises, leg exercises               Please read over the following fact sheets you were given: _____________________________________________________________________             [facility] - Preparing for Surgery Before surgery, you can play an important role.  Because skin is not sterile, your skin needs to be as free of germs as possible.  You can reduce the number of germs on your skin by washing with CHG (chlorahexidine gluconate) soap before surgery.  CHG is an antiseptic cleaner which kills germs and bonds with the skin to continue killing germs even after washing. Please DO NOT use if you have an allergy to CHG or antibacterial soaps.  If your skin becomes reddened/irritated stop using the CHG and inform your nurse when you arrive at Short Stay. Do not shave (including legs and underarms) for at least 48 hours prior  to the first CHG shower.  You may shave your face/neck. Please follow these instructions carefully:  1.  Shower with CHG Soap the night before surgery and the  morning of Surgery.  2.  If you choose to wash your hair, wash your hair first as usual with your  normal  shampoo.  3.  After you shampoo, rinse your hair and body thoroughly to remove the  shampoo.                           4.  Use CHG as you would any other liquid soap.  You can apply chg directly  to the skin and wash                       Gently with  a scrungie or clean washcloth.  5.  Apply the CHG Soap to your body ONLY FROM THE NECK DOWN.   Do not use on face/ open                           Wound or open sores. Avoid contact with eyes, ears mouth and genitals (private parts).                       Wash face,  Genitals (private parts) with your normal soap.             6.  Wash thoroughly, paying special attention to the area where your surgery  will be performed.  7.  Thoroughly rinse your body with warm water from the neck down.  8.  DO NOT shower/wash with your normal soap after using and rinsing off  the CHG Soap.                9.  Pat yourself dry with a clean towel.            10.  Wear clean pajamas.            11.  Place clean sheets on your bed the night of your first shower and do not  sleep with pets. Day of Surgery : Do not apply any lotions/deodorants the morning of surgery.  Please wear clean clothes to the hospital/surgery center.  FAILURE TO FOLLOW THESE INSTRUCTIONS MAY RESULT IN THE CANCELLATION OF YOUR SURGERY PATIENT SIGNATURE_________________________________  NURSE SIGNATURE__________________________________  ________________________________________________________________________  WHAT IS A BLOOD TRANSFUSION? Blood Transfusion Information  A transfusion is the replacement of blood or some of its parts. Blood is made up of multiple cells which provide different functions.  Red blood cells carry oxygen  and are used for blood loss replacement.  White blood cells fight against infection.  Platelets control bleeding.  Plasma helps clot blood.  Other blood products are available for specialized needs, such as hemophilia or other clotting disorders. BEFORE THE TRANSFUSION  Who gives blood for transfusions?   Healthy volunteers who are fully evaluated to make sure their blood is safe. This is blood bank blood. Transfusion therapy is the safest it has ever been in the practice of medicine. Before blood is taken from a donor, a complete history is taken to make sure that person has no history of diseases nor engages in risky social behavior (examples are intravenous drug use or sexual activity with multiple partners). The donor's travel history is screened to minimize risk of transmitting infections, such as malaria. The donated blood is tested for signs of infectious diseases, such as HIV and hepatitis. The blood is then tested to be sure it is compatible with you in order to minimize the chance of a transfusion reaction. If you or a relative donates blood, this is often done in anticipation of surgery and is not appropriate for emergency situations. It takes many days to process the donated blood. RISKS AND COMPLICATIONS Although transfusion therapy is very safe and saves many lives, the main dangers of transfusion include:   Getting an infectious disease.  Developing a transfusion reaction. This is an allergic reaction to something in the blood you were given. Every precaution is taken to prevent this. The decision to have a blood transfusion has been considered carefully by your caregiver before blood is given. Blood is not given unless the benefits outweigh the risks.  AFTER THE TRANSFUSION  Right after receiving a blood transfusion, you will usually feel much better and more energetic. This is especially true if your red blood cells have gotten low (anemic). The transfusion raises the level of  the red blood cells which carry oxygen, and this usually causes an energy increase.  The nurse administering the transfusion will monitor you carefully for complications. HOME CARE INSTRUCTIONS  No special instructions are needed after a transfusion. You may find your energy is better. Speak with your caregiver about any limitations on activity for underlying diseases you may have. SEEK MEDICAL CARE IF:   Your condition is not improving after your transfusion.  You develop redness or irritation at the intravenous (IV) site. SEEK IMMEDIATE MEDICAL CARE IF:  Any of the following symptoms occur over the next 12 hours:  Shaking chills.  You have a temperature by mouth above 102 F (38.9 C), not controlled by medicine.  Chest, back, or muscle pain.  People around you feel you are not acting correctly or are confused.  Shortness of breath or difficulty breathing.  Dizziness and fainting.  You get a rash or develop hives.  You have a decrease in urine output.  Your urine turns a dark color or changes to pink, red, or brown. Any of the following symptoms occur over the next 10 days:  You have a temperature by mouth above 102 F (38.9 C), not controlled by medicine.  Shortness of breath.  Weakness after normal activity.  The white part of the eye turns yellow (jaundice).  You have a decrease in the amount of urine or are urinating less often.  Your urine turns a dark color or changes to pink, red, or brown. Document Released: 05/16/2000 Document Revised: 08/11/2011 Document Reviewed: 01/03/2008 [facility] Patient Information 2014 [place], [place].  _______________________________________________________________________

## 2015-01-16 ENCOUNTER — Encounter (HOSPITAL_COMMUNITY)
Admission: RE | Admit: 2015-01-16 | Discharge: 2015-01-16 | Disposition: A | Discharge status: Home / Self Care | Payer: Medicare Other | Source: Ambulatory Visit | Attending: Urology | Admitting: Urology

## 2015-01-16 ENCOUNTER — Encounter (HOSPITAL_COMMUNITY): Payer: Self-pay

## 2015-01-16 HISTORY — DX: Gastro-esophageal reflux disease without esophagitis: K21.9

## 2015-01-16 LAB — COMPREHENSIVE METABOLIC PANEL
ALT: 31 U/L (ref 17–63)
AST: 41 U/L (ref 15–41)
Albumin: 4 g/dL (ref 3.5–5.0)
Alkaline Phosphatase: 62 U/L (ref 38–126)
Anion gap: 6 (ref 5–15)
BUN: 11 mg/dL (ref 6–20)
CO2: 30 mmol/L (ref 22–32)
CREATININE: 0.82 mg/dL (ref 0.61–1.24)
Calcium: 9 mg/dL (ref 8.9–10.3)
Chloride: 104 mmol/L (ref 101–111)
GFR calc Af Amer: >60 mL/min (ref >60)
GFR calc non Af Amer: >60 mL/min (ref >60)
Glucose, Bld: 96 mg/dL (ref 65–99)
Potassium: 3.7 mmol/L (ref 3.5–5.1)
SODIUM: 140 mmol/L (ref 135–145)
Total Bilirubin: 1.1 mg/dL (ref 0.3–1.2)
Total Protein: 7.3 g/dL (ref 6.5–8.1)

## 2015-01-16 LAB — CBC
HCT: 43 % (ref 39.0–52.0)
Hemoglobin: 14.4 g/dL (ref 13.0–17.0)
MCH: 29.8 pg (ref 26.0–34.0)
MCHC: 33.5 g/dL (ref 30.0–36.0)
MCV: 88.8 fL (ref 78.0–100.0)
Platelets: 285 10*3/uL (ref 150–400)
RBC: 4.84 MIL/uL (ref 4.22–5.81)
RDW: 13.2 % (ref 11.5–15.5)
WBC: 8.3 10*3/uL (ref 4.0–10.5)

## 2015-01-16 LAB — ABO/RH: ABO/RH(D): B POS

## 2015-01-16 NOTE — Progress Notes (Signed)
Clearance- DR Wende Neighbors- 11/24/14 on chart

## 2015-01-18 LAB — URINE CULTURE: CULTURE: NO GROWTH

## 2015-01-19 ENCOUNTER — Inpatient Hospital Stay (HOSPITAL_COMMUNITY): Payer: Medicare Other | Admitting: Anesthesiology

## 2015-01-19 ENCOUNTER — Inpatient Hospital Stay (HOSPITAL_COMMUNITY)
Admission: RE | Admit: 2015-01-19 | Discharge: 2015-01-22 | DRG: 708 | Disposition: A | Discharge status: Home / Self Care | Payer: Medicare Other | Source: Ambulatory Visit | Attending: Urology | Admitting: Urology

## 2015-01-19 ENCOUNTER — Encounter (HOSPITAL_COMMUNITY): Payer: Self-pay

## 2015-01-19 ENCOUNTER — Encounter (HOSPITAL_COMMUNITY)
Admission: RE | Disposition: A | Discharge status: Home / Self Care | Payer: Self-pay | Source: Ambulatory Visit | Attending: Urology

## 2015-01-19 ENCOUNTER — Encounter (HOSPITAL_COMMUNITY): Payer: Self-pay | Admitting: *Deleted

## 2015-01-19 DIAGNOSIS — Z87442 Personal history of urinary calculi: Secondary | ICD-10-CM | POA: Diagnosis not present

## 2015-01-19 DIAGNOSIS — E669 Obesity, unspecified: Secondary | ICD-10-CM | POA: Diagnosis present

## 2015-01-19 DIAGNOSIS — Z01812 Encounter for preprocedural laboratory examination: Secondary | ICD-10-CM | POA: Diagnosis not present

## 2015-01-19 DIAGNOSIS — Z820 Family history of epilepsy and other diseases of the nervous system: Secondary | ICD-10-CM

## 2015-01-19 DIAGNOSIS — Z85038 Personal history of other malignant neoplasm of large intestine: Secondary | ICD-10-CM

## 2015-01-19 DIAGNOSIS — K660 Peritoneal adhesions (postprocedural) (postinfection): Secondary | ICD-10-CM | POA: Diagnosis present

## 2015-01-19 DIAGNOSIS — D649 Anemia, unspecified: Secondary | ICD-10-CM | POA: Diagnosis present

## 2015-01-19 DIAGNOSIS — N210 Calculus in bladder: Secondary | ICD-10-CM | POA: Diagnosis present

## 2015-01-19 DIAGNOSIS — N400 Enlarged prostate without lower urinary tract symptoms: Secondary | ICD-10-CM | POA: Diagnosis not present

## 2015-01-19 DIAGNOSIS — C61 Malignant neoplasm of prostate: Secondary | ICD-10-CM | POA: Diagnosis present

## 2015-01-19 DIAGNOSIS — N3289 Other specified disorders of bladder: Secondary | ICD-10-CM | POA: Diagnosis not present

## 2015-01-19 DIAGNOSIS — Z79899 Other long term (current) drug therapy: Secondary | ICD-10-CM | POA: Diagnosis not present

## 2015-01-19 DIAGNOSIS — Z87891 Personal history of nicotine dependence: Secondary | ICD-10-CM | POA: Diagnosis not present

## 2015-01-19 DIAGNOSIS — K219 Gastro-esophageal reflux disease without esophagitis: Secondary | ICD-10-CM | POA: Diagnosis present

## 2015-01-19 DIAGNOSIS — Z6831 Body mass index (BMI) 31.0-31.9, adult: Secondary | ICD-10-CM | POA: Diagnosis not present

## 2015-01-19 HISTORY — PX: ROBOT ASSISTED LAPAROSCOPIC RADICAL PROSTATECTOMY: SHX5141

## 2015-01-19 LAB — HEMOGLOBIN AND HEMATOCRIT, BLOOD
HCT: 38.5 % — ABNORMAL LOW (ref 39.0–52.0)
Hemoglobin: 13.1 g/dL (ref 13.0–17.0)

## 2015-01-19 LAB — CBC
HCT: 41.4 % (ref 39.0–52.0)
HEMOGLOBIN: 13.9 g/dL (ref 13.0–17.0)
MCH: 29.8 pg (ref 26.0–34.0)
MCHC: 33.6 g/dL (ref 30.0–36.0)
MCV: 88.7 fL (ref 78.0–100.0)
Platelets: 246 10*3/uL (ref 150–400)
RBC: 4.67 MIL/uL (ref 4.22–5.81)
RDW: 13 % (ref 11.5–15.5)
WBC: 14.8 10*3/uL — AB (ref 4.0–10.5)

## 2015-01-19 LAB — CREATININE, SERUM
CREATININE: 1.25 mg/dL — AB (ref 0.61–1.24)
GFR, EST NON AFRICAN AMERICAN: 57 mL/min — AB (ref >60)

## 2015-01-19 LAB — TYPE AND SCREEN
ABO/RH(D): B POS
ANTIBODY SCREEN: NEGATIVE

## 2015-01-19 SURGERY — ROBOTIC ASSISTED LAPAROSCOPIC RADICAL PROSTATECTOMY
Anesthesia: General | Laterality: Bilateral

## 2015-01-19 MED ORDER — BUPIVACAINE-EPINEPHRINE 0.25% -1:200000 IJ SOLN
INTRAMUSCULAR | Status: AC
  Filled 2015-01-19: qty 1

## 2015-01-19 MED ORDER — LACTATED RINGERS IR SOLN
Status: DC | PRN
  Administered 2015-01-19: 1000 mL

## 2015-01-19 MED ORDER — BUPIVACAINE LIPOSOME 1.3 % IJ SUSP
20.0000 mL | Freq: Once | INTRAMUSCULAR | Status: AC
  Administered 2015-01-19: 20 mL
  Filled 2015-01-19: qty 20

## 2015-01-19 MED ORDER — FENTANYL CITRATE (PF) 250 MCG/5ML IJ SOLN
INTRAMUSCULAR | Status: AC
  Filled 2015-01-19: qty 25

## 2015-01-19 MED ORDER — SUCCINYLCHOLINE CHLORIDE 20 MG/ML IJ SOLN
INTRAMUSCULAR | Status: DC | PRN
  Administered 2015-01-19: 100 mg via INTRAVENOUS

## 2015-01-19 MED ORDER — CEFAZOLIN SODIUM-DEXTROSE 2-3 GM-% IV SOLR
INTRAVENOUS | Status: AC
  Filled 2015-01-19: qty 50

## 2015-01-19 MED ORDER — FENTANYL CITRATE (PF) 100 MCG/2ML IJ SOLN
INTRAMUSCULAR | Status: DC | PRN
  Administered 2015-01-19 (×5): 50 ug via INTRAVENOUS

## 2015-01-19 MED ORDER — ONDANSETRON HCL 4 MG/2ML IJ SOLN
INTRAMUSCULAR | Status: AC
  Filled 2015-01-19: qty 2

## 2015-01-19 MED ORDER — GLYCOPYRROLATE 0.2 MG/ML IJ SOLN
INTRAMUSCULAR | Status: AC
  Filled 2015-01-19: qty 1

## 2015-01-19 MED ORDER — EPHEDRINE SULFATE 50 MG/ML IJ SOLN
INTRAMUSCULAR | Status: DC | PRN
  Administered 2015-01-19: 5 mg via INTRAVENOUS

## 2015-01-19 MED ORDER — DEXTROSE-NACL 5-0.45 % IV SOLN
INTRAVENOUS | Status: DC
  Administered 2015-01-19: 1000 mL via INTRAVENOUS
  Administered 2015-01-20 (×3): via INTRAVENOUS

## 2015-01-19 MED ORDER — LACTATED RINGERS IV SOLN
INTRAVENOUS | Status: DC | PRN
  Administered 2015-01-19 (×2): via INTRAVENOUS

## 2015-01-19 MED ORDER — HEPARIN SODIUM (PORCINE) 5000 UNIT/ML IJ SOLN
5000.0000 [IU] | Freq: Three times a day (TID) | INTRAMUSCULAR | Status: DC
  Administered 2015-01-20 – 2015-01-22 (×7): 5000 [IU] via SUBCUTANEOUS
  Filled 2015-01-19 (×9): qty 1

## 2015-01-19 MED ORDER — CEFAZOLIN SODIUM-DEXTROSE 2-3 GM-% IV SOLR
2.0000 g | INTRAVENOUS | Status: AC
  Administered 2015-01-19 (×2): 2 g via INTRAVENOUS

## 2015-01-19 MED ORDER — FENTANYL CITRATE (PF) 100 MCG/2ML IJ SOLN
INTRAMUSCULAR | Status: AC
  Administered 2015-01-19: 25 ug via INTRAVENOUS
  Filled 2015-01-19: qty 2

## 2015-01-19 MED ORDER — SODIUM CHLORIDE 0.9 % IV BOLUS (SEPSIS)
1000.0000 mL | Freq: Once | INTRAVENOUS | Status: AC
  Administered 2015-01-19: 1000 mL via INTRAVENOUS

## 2015-01-19 MED ORDER — NEOSTIGMINE METHYLSULFATE 10 MG/10ML IV SOLN
INTRAVENOUS | Status: DC | PRN
  Administered 2015-01-19: 5 mg via INTRAVENOUS

## 2015-01-19 MED ORDER — MIDAZOLAM HCL 5 MG/5ML IJ SOLN
INTRAMUSCULAR | Status: DC | PRN
  Administered 2015-01-19 (×2): 1 mg via INTRAVENOUS

## 2015-01-19 MED ORDER — LACTATED RINGERS IV SOLN
INTRAVENOUS | Status: DC | PRN
  Administered 2015-01-19: 11:00:00 via INTRAVENOUS

## 2015-01-19 MED ORDER — SODIUM CHLORIDE 0.9 % IJ SOLN
INTRAMUSCULAR | Status: DC | PRN
  Administered 2015-01-19: 20 mL

## 2015-01-19 MED ORDER — STERILE WATER FOR IRRIGATION IR SOLN
Status: DC | PRN
  Administered 2015-01-19: 1000 mL

## 2015-01-19 MED ORDER — SULFAMETHOXAZOLE-TRIMETHOPRIM 800-160 MG PO TABS: 1.0000 | ORAL_TABLET | Freq: Two times a day (BID) | ORAL | Status: DC

## 2015-01-19 MED ORDER — PROPOFOL 10 MG/ML IV BOLUS
INTRAVENOUS | Status: DC | PRN
  Administered 2015-01-19: 200 mg via INTRAVENOUS

## 2015-01-19 MED ORDER — LIDOCAINE HCL (CARDIAC) 20 MG/ML IV SOLN
INTRAVENOUS | Status: AC
  Filled 2015-01-19: qty 5

## 2015-01-19 MED ORDER — ONDANSETRON HCL 4 MG/2ML IJ SOLN: 4.0000 mg | Freq: Once | INTRAMUSCULAR | Status: DC | PRN

## 2015-01-19 MED ORDER — OXYCODONE HCL 5 MG PO TABS
5.0000 mg | ORAL_TABLET | ORAL | Status: DC | PRN
  Administered 2015-01-20 – 2015-01-21 (×10): 5 mg via ORAL
  Filled 2015-01-19 (×10): qty 1

## 2015-01-19 MED ORDER — ONDANSETRON HCL 4 MG/2ML IJ SOLN
INTRAMUSCULAR | Status: DC | PRN
  Administered 2015-01-19: 4 mg via INTRAVENOUS

## 2015-01-19 MED ORDER — HYDROCODONE-ACETAMINOPHEN 5-325 MG PO TABS: 1.0000 | ORAL_TABLET | Freq: Four times a day (QID) | ORAL | Status: DC | PRN

## 2015-01-19 MED ORDER — ROCURONIUM BROMIDE 100 MG/10ML IV SOLN
INTRAVENOUS | Status: AC
  Filled 2015-01-19: qty 1

## 2015-01-19 MED ORDER — GLYCOPYRROLATE 0.2 MG/ML IJ SOLN
INTRAMUSCULAR | Status: DC | PRN
  Administered 2015-01-19: 0.2 mg via INTRAVENOUS
  Administered 2015-01-19: .8 mg via INTRAVENOUS

## 2015-01-19 MED ORDER — DEXAMETHASONE SODIUM PHOSPHATE 10 MG/ML IJ SOLN
INTRAMUSCULAR | Status: DC | PRN
  Administered 2015-01-19: 10 mg via INTRAVENOUS

## 2015-01-19 MED ORDER — BUPIVACAINE-EPINEPHRINE (PF) 0.25% -1:200000 IJ SOLN
INTRAMUSCULAR | Status: DC | PRN
  Administered 2015-01-19: 6 mL

## 2015-01-19 MED ORDER — ROCURONIUM BROMIDE 100 MG/10ML IV SOLN
INTRAVENOUS | Status: DC | PRN
  Administered 2015-01-19: 10 mg via INTRAVENOUS
  Administered 2015-01-19 (×3): 20 mg via INTRAVENOUS
  Administered 2015-01-19: 10 mg via INTRAVENOUS
  Administered 2015-01-19: 40 mg via INTRAVENOUS
  Administered 2015-01-19: 10 mg via INTRAVENOUS
  Administered 2015-01-19: 20 mg via INTRAVENOUS

## 2015-01-19 MED ORDER — FENTANYL CITRATE (PF) 100 MCG/2ML IJ SOLN
25.0000 ug | INTRAMUSCULAR | Status: DC | PRN
  Administered 2015-01-19: 50 ug via INTRAVENOUS
  Administered 2015-01-19 (×3): 25 ug via INTRAVENOUS

## 2015-01-19 MED ORDER — PANTOPRAZOLE SODIUM 40 MG PO TBEC
40.0000 mg | DELAYED_RELEASE_TABLET | Freq: Every day | ORAL | Status: DC
Start: 2015-01-19 — End: 2015-01-22
  Administered 2015-01-19 – 2015-01-22 (×4): 40 mg via ORAL
  Filled 2015-01-19 (×6): qty 1

## 2015-01-19 MED ORDER — FUROSEMIDE 10 MG/ML IJ SOLN
40.0000 mg | Freq: Once | INTRAMUSCULAR | Status: AC
  Administered 2015-01-19: 40 mg via INTRAVENOUS

## 2015-01-19 MED ORDER — LIDOCAINE HCL (CARDIAC) 20 MG/ML IV SOLN
INTRAVENOUS | Status: DC | PRN
Start: 2015-01-19 — End: 2015-01-19
  Administered 2015-01-19: 100 mg via INTRAVENOUS

## 2015-01-19 MED ORDER — CIPROFLOXACIN IN D5W 400 MG/200ML IV SOLN
400.0000 mg | INTRAVENOUS | Status: AC
  Administered 2015-01-19: 400 mg via INTRAVENOUS

## 2015-01-19 MED ORDER — PROPOFOL 10 MG/ML IV BOLUS
INTRAVENOUS | Status: AC
Start: 2015-01-19 — End: 2015-01-19
  Filled 2015-01-19: qty 20

## 2015-01-19 MED ORDER — MIDAZOLAM HCL 2 MG/2ML IJ SOLN
INTRAMUSCULAR | Status: AC
  Filled 2015-01-19: qty 4

## 2015-01-19 MED ORDER — ACETAMINOPHEN 500 MG PO TABS
1000.0000 mg | ORAL_TABLET | Freq: Four times a day (QID) | ORAL | Status: AC
  Administered 2015-01-19 – 2015-01-20 (×4): 1000 mg via ORAL
  Filled 2015-01-19 (×4): qty 2

## 2015-01-19 MED ORDER — HYDROMORPHONE HCL 1 MG/ML IJ SOLN
0.5000 mg | INTRAMUSCULAR | Status: DC | PRN
  Administered 2015-01-19 – 2015-01-20 (×2): 1 mg via INTRAVENOUS
  Filled 2015-01-19 (×2): qty 1

## 2015-01-19 MED ORDER — CIPROFLOXACIN IN D5W 400 MG/200ML IV SOLN
INTRAVENOUS | Status: AC
  Filled 2015-01-19: qty 200

## 2015-01-19 MED ORDER — DEXAMETHASONE SODIUM PHOSPHATE 10 MG/ML IJ SOLN
INTRAMUSCULAR | Status: AC
  Filled 2015-01-19: qty 1

## 2015-01-19 MED ORDER — FUROSEMIDE 10 MG/ML IJ SOLN
INTRAMUSCULAR | Status: AC
  Filled 2015-01-19: qty 4

## 2015-01-19 MED ORDER — ONDANSETRON HCL 4 MG/2ML IJ SOLN
4.0000 mg | INTRAMUSCULAR | Status: DC | PRN
  Administered 2015-01-19: 4 mg via INTRAVENOUS
  Filled 2015-01-19: qty 2

## 2015-01-19 MED ORDER — SODIUM CHLORIDE 0.9 % IJ SOLN
INTRAMUSCULAR | Status: AC
  Filled 2015-01-19: qty 20

## 2015-01-19 SURGICAL SUPPLY — 59 items
CABLE HIGH FREQUENCY MONO STRZ (ELECTRODE) ×3 IMPLANT
CATH FOLEY 2WAY SLVR 18FR 30CC (CATHETERS) ×6 IMPLANT
CATH ROBINSON RED A/P 16FR (CATHETERS) ×3 IMPLANT
CATH TIEMANN FOLEY 18FR 5CC (CATHETERS) ×3 IMPLANT
CHLORAPREP W/TINT 26ML (MISCELLANEOUS) ×3 IMPLANT
CLIP LIGATING HEM O LOK PURPLE (MISCELLANEOUS) ×6 IMPLANT
CLOTH BEACON ORANGE TIMEOUT ST (SAFETY) ×3 IMPLANT
COVER MAYO STAND STRL (DRAPES) IMPLANT
COVER TIP SHEARS 8 DVNC (MISCELLANEOUS) ×1 IMPLANT
COVER TIP SHEARS 8MM DA VINCI (MISCELLANEOUS) ×2
CUTTER ECHEON FLEX ENDO 45 340 (ENDOMECHANICALS) ×3 IMPLANT
DECANTER SPIKE VIAL GLASS SM (MISCELLANEOUS) IMPLANT
DRAPE ARM DVNC X/XI (DISPOSABLE) ×4 IMPLANT
DRAPE COLUMN DVNC XI (DISPOSABLE) ×1 IMPLANT
DRAPE DA VINCI XI ARM (DISPOSABLE) ×8
DRAPE DA VINCI XI COLUMN (DISPOSABLE) ×2
DRAPE LAPAROSCOPIC ABDOMINAL (DRAPES) ×3 IMPLANT
DRSG TEGADERM 4X4.75 (GAUZE/BANDAGES/DRESSINGS) IMPLANT
DRSG TEGADERM 6X8 (GAUZE/BANDAGES/DRESSINGS) IMPLANT
ELECT REM PT RETURN 9FT ADLT (ELECTROSURGICAL) ×3
ELECTRODE REM PT RTRN 9FT ADLT (ELECTROSURGICAL) ×1 IMPLANT
GAUZE SPONGE 2X2 8PLY STRL LF (GAUZE/BANDAGES/DRESSINGS) IMPLANT
GLOVE BIO SURGEON STRL SZ 6.5 (GLOVE) ×2 IMPLANT
GLOVE BIO SURGEONS STRL SZ 6.5 (GLOVE) ×1
GLOVE BIOGEL M STRL SZ7.5 (GLOVE) ×6 IMPLANT
GOWN STRL REUS W/TWL LRG LVL3 (GOWN DISPOSABLE) ×6 IMPLANT
GOWN STRL REUS W/TWL LRG LVL4 (GOWN DISPOSABLE) ×9 IMPLANT
HEMOSTAT SURGICEL 4X8 (HEMOSTASIS) ×3 IMPLANT
HOLDER FOLEY CATH W/STRAP (MISCELLANEOUS) ×3 IMPLANT
IV LACTATED RINGERS 1000ML (IV SOLUTION) IMPLANT
KIT PROCEDURE DA VINCI SI (MISCELLANEOUS)
KIT PROCEDURE DVNC SI (MISCELLANEOUS) IMPLANT
LIQUID BAND (GAUZE/BANDAGES/DRESSINGS) ×3 IMPLANT
NEEDLE INSUFFLATION 14GA 120MM (NEEDLE) ×3 IMPLANT
PACK ROBOT UROLOGY CUSTOM (CUSTOM PROCEDURE TRAY) ×3 IMPLANT
PAD POSITIONING PINK XL (MISCELLANEOUS) ×3 IMPLANT
PEN SKIN MARKING BROAD (MISCELLANEOUS) ×3 IMPLANT
RELOAD GREEN ECHELON 45 (STAPLE) ×3 IMPLANT
SCISSORS LAP 5X45 EPIX DISP (ENDOMECHANICALS) ×3 IMPLANT
SEAL CANN UNIV 5-8 DVNC XI (MISCELLANEOUS) ×4 IMPLANT
SEAL XI 5MM-8MM UNIVERSAL (MISCELLANEOUS) ×8
SET TUBE IRRIG SUCTION NO TIP (IRRIGATION / IRRIGATOR) ×3 IMPLANT
SHEET LAVH (DRAPES) ×3 IMPLANT
SOLUTION ELECTROLUBE (MISCELLANEOUS) ×3 IMPLANT
SPONGE GAUZE 2X2 STER 10/PKG (GAUZE/BANDAGES/DRESSINGS)
SPONGE LAP 4X18 X RAY DECT (DISPOSABLE) ×3 IMPLANT
SUT ETHILON 3 0 PS 1 (SUTURE) IMPLANT
SUT MNCRL AB 4-0 PS2 18 (SUTURE) ×6 IMPLANT
SUT PDS AB 1 CT1 27 (SUTURE) ×6 IMPLANT
SUT VIC AB 2-0 SH 27 (SUTURE) ×2
SUT VIC AB 2-0 SH 27X BRD (SUTURE) ×1 IMPLANT
SUT VICRYL 0 UR6 27IN ABS (SUTURE) ×3 IMPLANT
SUT VLOC BARB 180 ABS3/0GR12 (SUTURE) ×9
SUTURE VLOC BRB 180 ABS3/0GR12 (SUTURE) ×3 IMPLANT
SYR 27GX1/2 1ML LL SAFETY (SYRINGE) ×3 IMPLANT
TOWEL OR 17X26 10 PK STRL BLUE (TOWEL DISPOSABLE) ×3 IMPLANT
TOWEL OR NON WOVEN STRL DISP B (DISPOSABLE) ×3 IMPLANT
TROCAR BLADELESS OPT 5 100 (ENDOMECHANICALS) ×3 IMPLANT
WATER STERILE IRR 1500ML POUR (IV SOLUTION) IMPLANT

## 2015-01-19 NOTE — Anesthesia Procedure Notes (Signed)
Procedure Name: Intubation Date/Time: 01/19/2015 11:11 AM Performed by: Carleene Cooper A Pre-anesthesia Checklist: Patient identified, Timeout performed, Emergency Drugs available, Suction available and Patient being monitored Patient Re-evaluated:Patient Re-evaluated prior to inductionOxygen Delivery Method: Circle system utilized Preoxygenation: Pre-oxygenation with 100% oxygen Intubation Type: IV induction Ventilation: Mask ventilation without difficulty and Oral airway inserted - appropriate to patient size Laryngoscope Size: Glidescope and 4 Grade View: Grade I Tube type: Parker flex tip Tube size: 7.5 mm Number of attempts: 1 Airway Equipment and Method: Video-laryngoscopy and Stylet Placement Confirmation: ETT inserted through vocal cords under direct vision,  breath sounds checked- equal and bilateral and positive ETCO2 Secured at: 25 cm Tube secured with: Tape Dental Injury: Teeth and Oropharynx as per pre-operative assessment  Comments: Elective Glidescope intubation due to past difficult intubation.

## 2015-01-19 NOTE — Transfer of Care (Signed)
Immediate Anesthesia Transfer of Care Note  Patient: William Burns  Procedure(s) Performed: Procedure(s): ROBOTIC ASSISTED LAPAROSCOPIC RADICAL PROSTATECTOMY AND BILATERAL PELVIC LYMPH NODE DISSECTION, LAPARASCOPIC  LYSIS OF ADHESIONS (Bilateral)  Patient Location: PACU  Anesthesia Type:General  Level of Consciousness: sedated, patient cooperative and responds to stimulation  Airway & Oxygen Therapy: Patient Spontanous Breathing and Patient connected to face mask oxygen  Post-op Assessment: Report given to RN and Post -op Vital signs reviewed and stable  Post vital signs: Reviewed and stable  Last Vitals:  Filed Vitals:   01/19/15 0913  BP: 141/71  Pulse: 80  Temp: 36.9 C  Resp: 16    Complications: No apparent anesthesia complications

## 2015-01-19 NOTE — Op Note (Addendum)
Preoperative diagnosis:  1. Prostate Cancer   Postoperative diagnosis:  1. same   Procedure: 1. Robotic assisted laparoscopic radical prostatectomy 2. Bilateral pelvic lymph node dissection 3. Laparoscopic lysis of adhesions (1.5 hrs) 4. Bladder stone removal  Surgeon: Ardis Hughs, MD First Assistant: Debbrah Alar, PA Resident assistant: Verdis Frederickson, M.D.  Anesthesia: General  Complications: None  Intraoperative findings: The patient had dense adhesions of the omentum and small bowel along the anterior abdominal wall at the site of the ventral hernia repair as well as the right hemicolectomy. These were taken down laparoscopically and required a total of approximately 1.5 hours of surgery.  The dissection along the right lobe of the prostate was concerning for subcapsular dissection, as such I took a segment of the neurovascular bundle which I sent for permanent specimen to ensure that the prostate cancer margins were negative.  EBL: 350 mL  Specimens:  #1.  Prostate and seminal vesicals #2.  Bilateral pelvic lymph nodes #3.  Right lateral prostatic margin.  Indication: William Burns is a 69 y.o. patient with prostate cancer.  After reviewing the management options for treatment, he elected to proceed with the removal of his prostate. We have discussed the potential benefits and risks of the procedure, side effects of the proposed treatment, the likelihood of the patient achieving the goals of the procedure, and any potential problems that might occur during the procedure or recuperation. Informed consent has been obtained.  Description of procedure:  The patient was consented in the preoperative holding area. He is in brought back to the operating room placed the table in supine position. General anesthesia was then induced and endotracheal tube was inserted. He was then placed in dorsolithotomy position and placed in steep Trendelenburg. He was then prepped and draped in  the routine sterile fashion. We began by placing a Veress needle in the left upper quadrant after making a stab incision and insufflating the abdomen to 15 mmHg. We then placed a 5 mm trocar in the left most lateral port position and obtained access under visual guidance. Once into the abdomen and became clear the patient had extensive adhesions of the omentum and small bowel at the area where the previous ventral hernia repair was as well as the right hemicolectomy. This point we used the Veress needle incision to place a second 5 mm port and with a pair of laparoscopic scissors proceeded to take down the adhesions for approximately 1.5 hours.   Once the adhesions had been taken down and we were confident that we could gain access into the abdomen without injury to proceeded by placing a 8 mm trocar in the supraumbilical region, through the ventral mesh. This was fairly straightforward uncomplicated. We then proceeded with an additional port 9 mm lateral of the camera port on the left side of the abdomen in line with the bellybutton. We placed 2 ports on the right abdomen in the routine position for prostatectomy. The most lateral port on the right was used for the assistant port. There is a second assistant port placed between the camera and the first arm on the right-hand side. Once the ports were noted to be satisfactory position the robot was docked. We started with the 0 lens, monopolar scissors and the right hand and the PK forceps the left hand as well as a fenestrated grasper as the third arm on the left-hand side.   We began our dissection by removing the bowel contents within the pelvis, more lysis  of adhesions.  We then gained access into  the posterior plane incising the peritoneum at the level of the vas deferens. I then isolated the left vas deferens and dissected it proximally towards the spermatic cord for 5 cm prior to ligating it. Then used this as traction to isolate the left the seminal  vesicle which was then undressed bluntly, all vessels were cauterized with a combination of bipolar and the monopolar scissors. Once this had been dissected out laterally and posteriorly turned our attention to the anterior plane and freed the the left seminal vesicle from the surrounding tissues. We then turned our attention to the right side and similarly dissected out the right vas deferens in the right seminal vesicle. Once the SCDs had been freed turned our attention to the posterior plane and bluntly dissected the tissue between the rectum and the posterior wall of the prostate bluntly out towards the apex.    This point the bladder was taken down starting at the urachal remnant with a combination of both blunt dissection and sharp dissection with monopolar cautery the bladder was dropped down in the usual fashion to the medial umbilical ligaments laterally and the dorsal vein of the prostate anteriorly creating our space of Retzius. We then turned our attention to the endopelvic fascia which was incised laterally starting on the patient's right-hand side the levator muscles were pushed off the prostate laterally up towards the dorsal vein complex on the right-hand side. This process was then repeated on the left-hand side and a nice notch was created for the dorsal vein. I then used a 2mm stapler to staple the dorsal vein.   We then located the bladder neck at the vesicoprostatic junction and the monopolar scissors dissected down through the perivesical tissues and the bladder neck down to the prostatic urethra. The catheter was then deflated and pulled through our urethral opening and then used to retract the prostate anteriorly for the posterior bladder neck dissection. Once through the bladder neck and into the posterior plane of the prostate the SVs were brought through the opening. The left pedicle was then isolated and systematically ligated with Weck clips and scissors.  This was then repeated on  the right side.  I was concerned that I had injected into the capsule of the prostate, especially on the right lateral wall, as such, I did opt to take some of the neurovascular bundle on the right and send it as a separate specimen, hoping to ensure that the cancer margins were negative.  I then came down through the dorsal venous complex anteriorly down to the membranous urethra using the monopolar. Once down to the urethra the urethra was transected sharply and the apex of the prostate was then dissected off the levator and rectourethralis muscles. Once the apex of the prostate had been dissected free we came back to the base of the prostate and bluntly push the rectum and nerve vascular bundle off the prostate the patient's left and used clips on the patient's right to free the prostate. Once the prostate was free it was placed in the Endo Catch bag and the string brought to the 5 mm port. The pelvis was then irrigated with normal saline and noted to be relatively hemostatic.   At this point, we removed the bladder stones. This was done by milking the contents of the bladder to the bladder neck and expelling the stone. One stone was easily expelled. The second stone was never encountered although there were several  small fragments that were removed as part of a large blood clot. We irrigated the bladder out several times unable to catch the second stone, unclear if there was a second stone at this point.  Using the 3-0 v lock suture a Rocco stitch was performed pulling the bladder neck down to the urethral stump. The vesicourethral anastomosis was then completed with 2 interlocking 3-0 V. lock sutures running the anastomosis in the 6:00 position to the 12:00 position on each side and then tying it off on the top. The final catheter was then passed through the patient's urethra and into the bladder and 120 cc was instilled into the bladder to test the anastomosis.   This point pelvic lymph node dissection  was performed bilaterally. This was done by incising the posterior peritoneal fascia over in the external iliac artery and removing the tissue overlying the iliac vein as well as within the obturator fossa, behind the obturator nerve. What clips were placed at the apex of this dissection along the left channels. Second set of what clips was used to a ligate the packet at the junction of the external and internal iliac artery. We did both the right and left side.   I then placed a 34 [place] Blake drain through the left lateral port and placed around the vesicourethral anastomosis. A 12 mm assistant port on the right lateral side was then closed with 0 Vicryl with the help of the [employer] needle. The specimen was then brought through the paramedial port on the left outside of the ventral hernia fascia which was opened up large enough to get the Endo Catch bag and specimens outside. The fascia was then closed with a 0 Vicryl and all skin ports were closed with 4-0 Monocryl in a subcutaneous fashion. Dermabond glue was then applied to the incisions. The drain was then secured to the skin with a 0 nylon stitch and dressing applied.   At the end of the case all laps needles and sponges had been accounted for. There no immediate complications. The patient returned to the PACU in stable condition.

## 2015-01-19 NOTE — Anesthesia Preprocedure Evaluation (Addendum)
Anesthesia Evaluation  Patient identified by MRN, date of birth, ID band Patient awake    Reviewed: Allergy & Precautions, NPO status , Patient's Chart, lab work & pertinent test results  History of Anesthesia Complications (+) DIFFICULT AIRWAY and history of anesthetic complications (Has required glidescope for intubation. Anterior larynx, long neck.)  Airway Mallampati: II  TM Distance: >3 FB Neck ROM: Full    Dental  (+) Partial Upper   Pulmonary former smoker,  breath sounds clear to auscultation  Pulmonary exam normal       Cardiovascular Exercise Tolerance: Good negative cardio ROS Normal cardiovascular examRhythm:Regular Rate:Normal     Neuro/Psych negative neurological ROS     GI/Hepatic Neg liver ROS, PUD, GERD-  Medicated,  Endo/Other  obesity  Renal/GU Renal diseaseNephrolithiasis Bladder dysfunction      Musculoskeletal negative musculoskeletal ROS (+)   Abdominal   Peds  Hematology  (+) Blood dyscrasia, anemia ,   Anesthesia Other Findings Day of surgery medications reviewed with the patient.  Has required Glidescope for intubations in the past  Reproductive/Obstetrics                          Anesthesia Physical Anesthesia Plan  ASA: III  Anesthesia Plan: General   Post-op Pain Management:    Induction: Intravenous  Airway Management Planned: Oral ETT  Additional Equipment:   Intra-op Plan:   Post-operative Plan: Extubation in OR  Informed Consent: I have reviewed the patients History and Physical, chart, labs and discussed the procedure including the risks, benefits and alternatives for the proposed anesthesia with the patient or authorized representative who has indicated his/her understanding and acceptance.   Dental advisory given  Plan Discussed with: CRNA  Anesthesia Plan Comments: (Risks/benefits of general anesthesia discussed with patient including  risk of damage to teeth, lips, gum, and tongue, nausea/vomiting, allergic reactions to medications, and the possibility of heart attack, stroke and death.  All patient questions answered.  Patient wishes to proceed.)        Anesthesia Quick Evaluation

## 2015-01-19 NOTE — Progress Notes (Addendum)
Day of Surgery Subjective: Patient reports no significant pain. Some dizziness and nausea when he stood last pm.  Objective: Vital signs in last 24 hours: Temp:  [97.6 F (36.4 C)-98.4 F (36.9 C)] 97.7 F (36.5 C) (08/19 2120) Pulse Rate:  [80-104] 88 (08/19 2120) Resp:  [15-20] 18 (08/19 2120) BP: (130-156)/(61-76) 130/61 mmHg (08/19 2120) SpO2:  [96 %-100 %] 100 % (08/19 2120) Weight:  [114.306 kg (252 lb)] 114.306 kg (252 lb) (08/19 0916)  Intake/Output from previous day:   Intake/Output this shift: Total I/O In: -  Out: 1920 [Urine:1900; Drains:20]  Drain output minimal  Physical Exam:  Constitutional: Vital signs reviewed. WD WN in NAD   Eyes: PERRL, No scleral icterus.  Pulmonary/Chest: Normal effort Abdominal: Soft. Non-tender, non-distended, bowel sounds are normal, no masses, organomegaly, or guarding present. Incision sites clean/dry Extremities: No cyanosis or edema   Lab Results:  Recent Labs  01/19/15 1834 01/19/15 2035  HGB 13.1 13.9  HCT 38.5* 41.4   BMET  Recent Labs  01/19/15 2035  CREATININE 1.25*   No results for input(s): LABPT, INR in the last 72 hours. No results for input(s): LABURIN in the last 72 hours. Results for orders placed or performed during the hospital encounter of 01/16/15  Urine culture     Status: None   Collection Time: 01/16/15  3:00 PM  Result Value Ref Range Status   Specimen Description URINE, RANDOM  Final   Special Requests NONE  Final   Culture   Final    NO GROWTH 2 DAYS Performed at [facility]    Report Status 01/18/2015 FINAL  Final    Studies/Results: No results found.  Assessment/Plan:   POD 1 RARP. Stable. No flatus yet.  Will check drain Cr. Poss d/c later today if he tolerates diet/feels well.   LOS: 0 days   Jorja Loa 01/19/2015, 10:06 PM   [email or web address]

## 2015-01-19 NOTE — H&P (Signed)
Reason For Visit Follow-up for discussion of surgery for his intermediate risk prostate.   History of Present Illness 69 year old male who initially presented with symptoms of renal colic and on associated colitis/enteritis and May 2015. He appeared to have passed the stone into his bladder being treated for colitis with Cipro/Flagyl.     LUTS- TURP in 2013 by Dr. Michela Pitcher at [facility]. Now on Flomax with improvement in his symptoms.  Nephrolithiasis - patient was seen in the emergency department April 2015 for nephrolithiasis. He then appeared to pass the stone into his bladder, but has not passed a stone as of June 2015.    6/15: Patient had not passed the stone in the interim. He was on Flomax to help facilitate passage of the bladder stone and he thinks that the Flomax is helping his symptoms, he is getting up less at night. He denies any flank pain or gross hematuria. He denies any irritative voiding symptoms , urgency, or incontinence.    Elevated PSA: The patient has a history of an elevated PSA. In 2008 the patient had a negative biopsy for PSA of around 6. His PSA has then been followed by his oncologist every 6 months, however, he no longer sees his oncologist on a scheduled basis. The patient has no family history of prostate cancer, no progressive lower urinary tract symptoms, no new bone pain or constitutional symptoms.     PSA:  11.44 (9% free) on 07/17/14  16.59 on 07/06/14  8.9 to (13% free) on 03/2014  7.84 on 10/2013  7.68 (11% free) on 03/2012  4.9 (12% free) on 09/2011  6 on 2008 - negative biopsy    Prostate cancer profile  Stage: T1c  PSA: 11.44  Biopsy ,1 /12 cores positive: Gleason 4+3=7 (30%) in RLM, atypia LLA  Prostate volume: Vol: 64g with median lobe   MRI: 1.4 x 0.8 cm area in RLA consistent with high grade disease - no extracapsular extension.    Interval: The patient presents today for discussion of robotic-assisted laparoscopic  prostatectomy. No changes in pts bowel habits, neurological changes, or progressive lower urinary tract symptoms.    Past Medical History Problems  1. History of Enlarged prostate (N40.0) 2. History of esophageal reflux (Z87.19) 3. History of malignant neoplasm of colon (B26.203)  Surgical History Problems  1. History of Colon Surgery 2. History of Hernia Repair  Current Meds 1. Fish Oil CAPS;  Therapy: (Recorded:04May2015) to Recorded 2. Multi-Vitamin TABS;  Therapy: (Recorded:04May2015) to Recorded 3. Pantoprazole Sodium 40 MG Oral Tablet Delayed Release;  Therapy: (Recorded:04May2015) to Recorded 4. ProAir HFA 108 (90 Base) MCG/ACT Inhalation Aerosol Solution;  Therapy: (Recorded:11Feb2016) to Recorded 5. Probiotic CAPS;  Therapy: (Recorded:11Feb2016) to Recorded  Allergies Medication  1. No Known Drug Allergies  Family History Problems  1. Family history of Family Health Status Number Of Children   2 sons 2. Family history of Alzheimer's disease (Z82.0) : Mother, Father  Social History Problems  1. Denied: Alcohol Use 2. Caffeine Use   3 3. Death in the family, father   age 24 4. Death in the family, mother   age 50 5. Former smoker [phone])   quit 1993 6. Married 7. Occupation:   farmer 74. Tobacco Use   2 packs, 25 years, quit 15 years ago 9. Two children  Vitals Vital Signs [Data Includes: Last 1 Day]  Recorded: [id number] 04:29PM  Blood Pressure: 161 / 101 Temperature: 98 F Heart Rate: 68  Physical Exam Constitutional: Well  nourished and well developed . No acute distress.  ENT:. The ears and nose are normal in appearance.  Neck: The appearance of the neck is normal and no neck mass is present.  Pulmonary: No respiratory distress and normal respiratory rhythm and effort.  Cardiovascular: Heart rate and rhythm are normal . No peripheral edema.  Abdomen: The abdomen is soft and nontender. No masses are palpated. No CVA tenderness. No  hernias are palpable. No hepatosplenomegaly noted.  Lymphatics: The femoral and inguinal nodes are not enlarged or tender.  Skin: Normal skin turgor, no visible rash and no visible skin lesions.  Neuro/Psych:. Mood and affect are appropriate.    Plan Prostate cancer  1. Follow-up Schedule Surgery Office  Follow-up  Status: Complete  Done: [id number] 2. PT/OT Referral Referral  Referral  Status: Hold For - PreCert,Date of Service,Physical  Therapy  Requested for: 28Jun2016  Discussion/Summary The patient has intermediate risk prostate cancer and has opted for surgical extirpation. He also has a bladder stone which will be removed concomitantly. The patient's circumstance is somewhat complicated given that he has a 10 cm wide by 15 cm long ventral mesh which is placed in 2012. 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 We discussed performing a robotic-assisted laparoscopic prostatectomy with the caveat that it may be difficult/impossible to get a adequate placed through the mesh for the camera. This would necessitate open prostatectomy.  However, we would try to start laparoscopically and proceed cautiously. We mainly discussed robotic prostatectomy with bilateral pelvic lymphadenectomy being the technique that I most commonly perform. I showed the patient on their abdomen the approximately 6 small incision (trocar) sites as well as presumed extraction sites with robotic approach as well as possible open incision sites should open conversion be necessary. We discussed peri-operative risks including bleeding, infection, deep vein thrombosis, pulmonary embolism, compartment syndrome, neuropathy / neuropraxia, heart attack, stroke, death, as well as long-term risks such as non-cure / need for additional therapy. We specifically addressed that the procedure would compromise urinary control leading to stress incontinence which typically resolves with time and pelvic  rehabilitation (Kegel's, etc..), but can sometimes be permanent and require additional therapy including surgery. We also specifically addressed sexual sequelae including significant erectile dysfunction which typically partially resolves with time but can also be permanent and require additional therapy including surgery.     We discussed the typical hospital course including usual 1-2 night hospitalization, discharge with foley catheter in place usually for 1-2 weeks before voiding trial as well as usually 2 week recovery until able to perform most non-strenuous activity and 6 weeks until able to return to most jobs and more strenuous activity such as exercise.

## 2015-01-19 NOTE — Discharge Instructions (Signed)

## 2015-01-20 LAB — BASIC METABOLIC PANEL
Anion gap: 5 (ref 5–15)
BUN: 18 mg/dL (ref 6–20)
CALCIUM: 8.2 mg/dL — AB (ref 8.9–10.3)
CO2: 27 mmol/L (ref 22–32)
CREATININE: 1.05 mg/dL (ref 0.61–1.24)
Chloride: 103 mmol/L (ref 101–111)
GFR calc Af Amer: >60 mL/min (ref >60)
GFR calc non Af Amer: >60 mL/min (ref >60)
GLUCOSE: 159 mg/dL — AB (ref 65–99)
Potassium: 4.2 mmol/L (ref 3.5–5.1)
Sodium: 135 mmol/L (ref 135–145)

## 2015-01-20 LAB — CREATININE, FLUID (PLEURAL, PERITONEAL, JP DRAINAGE): Creat, Fluid: 1.1 mg/dL

## 2015-01-20 LAB — HEMOGLOBIN AND HEMATOCRIT, BLOOD
HCT: 39.7 % (ref 39.0–52.0)
Hemoglobin: 13 g/dL (ref 13.0–17.0)

## 2015-01-20 MED ORDER — PHENOL 1.4 % MT LIQD
1.0000 | OROMUCOSAL | Status: DC | PRN
  Filled 2015-01-20: qty 177

## 2015-01-20 MED ORDER — MENTHOL 3 MG MT LOZG
1.0000 | LOZENGE | OROMUCOSAL | Status: DC | PRN
  Filled 2015-01-20: qty 9

## 2015-01-20 NOTE — Anesthesia Postprocedure Evaluation (Signed)
  Anesthesia Post-op Note  Patient: [name]  Procedure(s) Performed: Procedure(s) (LRB): ROBOTIC ASSISTED LAPAROSCOPIC RADICAL PROSTATECTOMY AND BILATERAL PELVIC LYMPH NODE DISSECTION, LAPARASCOPIC  LYSIS OF ADHESIONS (Bilateral)  Patient Location: PACU  Anesthesia Type: General  Level of Consciousness: awake and alert   Airway and Oxygen Therapy: Patient Spontanous Breathing  Post-op Pain: mild. Complaining of pain in arms. He is moving arms OK. No deformities.  Discussed with him patient positioning during surgery, with expectation that arm pain should resolve with time and movement.  Post-op Assessment: Post-op Vital signs reviewed, Patient's Cardiovascular Status Stable, Respiratory Function Stable, Patent Airway and No signs of Nausea or vomiting  Last Vitals:  Filed Vitals:   01/20/15 0431  BP: 118/56  Pulse: 73  Temp: 36.7 C  Resp: 18    Post-op Vital Signs: stable   Complications: No apparent anesthesia complications. Crown came off tooth when ETT removed. Crown given to wife with explanation.

## 2015-01-20 NOTE — Progress Notes (Signed)
Report received from [place]. No change in assessment. Continue plan of care. Stacey Drain

## 2015-01-20 NOTE — Progress Notes (Signed)
Pt had stable night, ambulated in the room became dizzy and feeling weak, returned to bed. Changed dressing around JP site x2--saturated. Pain med given as ordered, pt used IS as ordered. Wife stayed with pt during the night. Tolerated CL diet without nausea. VSs afebrile. SRP, RN

## 2015-01-21 NOTE — Plan of Care (Signed)
Problem: Phase I Progression Outcomes Goal: Voiding-avoid urinary catheter unless indicated Outcome: Not Applicable Date Met:  07/13/13 Post Op Robotic Prostatectomy Pt to be dcd to home with Foley Catheter

## 2015-01-21 NOTE — Progress Notes (Signed)
2 Days Post-Op Subjective: Patient reports still some abdominal pain. He did have flatus last night. He has no nausea.  Objective: Vital signs in last 24 hours: Temp:  [97.8 F (36.6 C)-98.8 F (37.1 C)] 98.8 F (37.1 C) (08/21 0512) Pulse Rate:  [62-73] 73 (08/21 0512) Resp:  [16-18] 16 (08/21 0512) BP: (101-112)/(52-65) 106/52 mmHg (08/21 0512) SpO2:  [95 %-97 %] 96 % (08/21 0512)  Intake/Output from previous day: 08/20 0701 - 08/21 0700 In: 2860 [P.O.:860; I.V.:2000] Out: 1990 [Urine:1850; Drains:140] Intake/Output this shift:    Physical Exam:  Constitutional: Vital signs reviewed. WD WN in NAD   Eyes: PERRL, No scleral icterus.   Cardiovascular: RRR Pulmonary/Chest: Normal effort Abdominal: Soft. Non-tender, non-distended, bowel sounds are minimal, no masses, organomegaly, or guarding present. Incision sites all look fine. No rebound or guarding.  Lab Results:  Recent Labs  01/19/15 1834 01/19/15 2035 01/20/15 0529  HGB 13.1 13.9 13.0  HCT 38.5* 41.4 39.7   BMET  Recent Labs  01/19/15 2035 01/20/15 0529  NA  --  135  K  --  4.2  CL  --  103  CO2  --  27  GLUCOSE  --  159*  BUN  --  18  CREATININE 1.25* 1.05  CALCIUM  --  8.2*   No results for input(s): LABPT, INR in the last 72 hours. No results for input(s): LABURIN in the last 72 hours. Results for orders placed or performed during the hospital encounter of 01/16/15  Urine culture     Status: None   Collection Time: 01/16/15  3:00 PM  Result Value Ref Range Status   Specimen Description URINE, RANDOM  Final   Special Requests NONE  Final   Culture   Final    NO GROWTH 2 DAYS Performed at [facility]    Report Status 01/18/2015 FINAL  Final    Studies/Results: No results found.  Assessment/Plan:   Postoperative day #2 robotic assisted radical prostatectomy. Overall he is doing well but still has a fair amount of abdominal pain. I will advance his diet. JP will be removed, we  will saline lock IV. If he is up to discharge this afternoon, that will be a possibility   LOS: 2 days   Franchot Gallo M 01/21/2015, 7:49 AM

## 2015-01-21 NOTE — Progress Notes (Signed)
Utilization review completed.  

## 2015-01-21 NOTE — Progress Notes (Signed)
Teaching and Instructions regarding Post op Prostatectomy Home care begun with Pt and Wife. Foley and leg bag teaching and demonstration. Pt and Wife verbalized understanding.

## 2015-01-22 ENCOUNTER — Encounter (HOSPITAL_COMMUNITY): Payer: Self-pay | Admitting: Urology

## 2015-01-22 LAB — CBC
HEMATOCRIT: 36.9 % — AB (ref 39.0–52.0)
HEMOGLOBIN: 12.2 g/dL — AB (ref 13.0–17.0)
MCH: 30.1 pg (ref 26.0–34.0)
MCHC: 33.1 g/dL (ref 30.0–36.0)
MCV: 91.1 fL (ref 78.0–100.0)
Platelets: 240 10*3/uL (ref 150–400)
RBC: 4.05 MIL/uL — AB (ref 4.22–5.81)
RDW: 13.2 % (ref 11.5–15.5)
WBC: 8.3 10*3/uL (ref 4.0–10.5)

## 2015-01-22 MED ORDER — DOCUSATE SODIUM 100 MG PO CAPS: 100.0000 mg | ORAL_CAPSULE | Freq: Two times a day (BID) | ORAL | Status: DC

## 2015-01-22 MED ORDER — OXYBUTYNIN CHLORIDE 5 MG PO TABS: 5.0000 mg | ORAL_TABLET | Freq: Three times a day (TID) | ORAL | Status: DC | PRN

## 2015-01-22 MED ORDER — SENNA 8.6 MG PO TABS: 2.0000 | ORAL_TABLET | Freq: Every day | ORAL | Status: DC

## 2015-01-22 NOTE — Care Management Note (Signed)
Case Management Note  Patient Details  Name: TUVIA WOODRICK MRN: [id number] Date of Birth: June 20, 1945  Subjective/Objective:69 y/o m admitted w/Prostate Ca. S/p lap prostatectomy.From home.                    Action/Plan:d/c home no needs or orders.   Expected Discharge Date:                  Expected Discharge Plan:  Home/Self Care  In-House Referral:     Discharge planning Services  CM Consult  Post Acute Care Choice:    Choice offered to:     DME Arranged:    DME Agency:     HH Arranged:    [place] Agency:     Status of Service:  Completed, signed off  Medicare Important Message Given:    Date Medicare IM Given:    Medicare IM give by:    Date Additional Medicare IM Given:    Additional Medicare Important Message give by:     If discussed at [place] of Stay Meetings, dates discussed:    Additional Comments:  Dessa Phi, RN 01/22/2015, 10:55 AM

## 2015-01-22 NOTE — Progress Notes (Signed)
POD#3 RALP S: Doing well.  Slight nausea intermittently, bladder spasms.  Pain otherwise tolerable.  Filed Vitals:   01/21/15 1339 01/21/15 1827 01/21/15 2149 01/22/15 0533  BP: 142/67 128/62 134/54 120/55  Pulse: 68 77 78 73  Temp: 98.8 F (37.1 C) 99.4 F (37.4 C) 99.2 F (37.3 C) 98.9 F (37.2 C)  TempSrc: Oral Oral Oral Oral  Resp: 16 16 16 16   Height:      Weight:      SpO2: 94% 97% 94% 94%    Intake/Output Summary (Last 24 hours) at 01/22/15 0828 Last data filed at 01/22/15 0534  Gross per 24 hour  Intake    560 ml  Output   1745 ml  Net  -1185 ml   NAD ABdomen soft, incisions c/d/i Foley draining clear yellow urine   Recent Labs  01/19/15 2035 01/20/15 0529 01/22/15 0539  WBC 14.8*  --  8.3  HGB 13.9 13.0 12.2*  HCT 41.4 39.7 36.9*    Recent Labs  01/19/15 2035 01/20/15 0529  NA  --  135  K  --  4.2  CL  --  103  CO2  --  27  GLUCOSE  --  159*  BUN  --  18  CREATININE 1.25* 1.05  CALCIUM  --  8.2*   No results for input(s): LABPT, INR in the last 72 hours. No results for input(s): PSA in the last 72 hours. No results for input(s): LABURIN in the last 72 hours. Results for orders placed or performed during the hospital encounter of 01/16/15  Urine culture     Status: None   Collection Time: 01/16/15  3:00 PM  Result Value Ref Range Status   Specimen Description URINE, RANDOM  Final   Special Requests NONE  Final   Culture   Final    NO GROWTH 2 DAYS Performed at [facility]    Report Status 01/18/2015 FINAL  Final    Imp: Doing well. Plan: Home today.

## 2015-01-22 NOTE — Care Management Important Message (Signed)
Important Message  Patient Details  Name: William Burns MRN: [id number] Date of Birth: 12-Jun-1945   Medicare Important Message Given:  Yes-second notification given    Dessa Phi, RN 01/22/2015, 10:56 AM

## 2015-01-22 NOTE — Discharge Summary (Signed)
Urology Discharge Summary  Admit date: 01/19/2015  Discharge date and time: No discharge date for patient encounter.    Discharge Attending Physician: Ardis Hughs, MD  Discharge  Diagnoses: Prostate  Secondary Diagnosis: Active Problems:   Prostate cancer   OR Procedures: Procedure(s): ROBOTIC ASSISTED LAPAROSCOPIC RADICAL PROSTATECTOMY AND BILATERAL PELVIC LYMPH NODE DISSECTION, LAPARASCOPIC  LYSIS OF ADHESIONS 01/19/2015   Discharge Day Services: The patient was seen and examined by the Urology team both in the morning and immediately prior to discharge.  Vital signs and laboratory values were stable and within normal limits.  The physical exam was benign and unchanged and all surgical wounds were examined.  Discharge instructions were explained and all questions answered.   Objective Patient Vitals for the past 8 hrs:  BP Temp Temp src Pulse Resp SpO2  01/22/15 0533 (!) 120/55 mmHg 98.9 F (37.2 C) Oral 73 16 94 %      Hospital Course:  The patient underwent a robotic assisted laparoscopic radical prostatectomy with extensive lysis of adhensions on 01/19/2015.  The patient tolerated the procedure well, was extubated in the OR, and afterwards was taken to the PACU for routine post-surgical care. When stable the patient was transferred to the floor.   The patient did well postoperatively.  The patient's diet was slowly advanced and at the time of discharge was tolerating a regular diet.  The patient was discharged home 3 Days Post-Op, at which point was tolerating a regular solid diet, have adequate pain control with P.O. pain medication, and could ambulate without difficulty.  The patient will follow up with [place] for post op check as scheduled below.   Condition at Discharge: stable Discharge Medications:  No current facility-administered medications on file prior to encounter.   Current Outpatient Prescriptions on File Prior to Encounter  Medication Sig Dispense Refill  .  diphenoxylate-atropine (LOMOTIL) 2.5-0.025 MG per tablet Take 1 tablet by mouth 4 (four) times daily as needed for diarrhea or loose stools. 30 tablet 0  . fish oil-omega-3 fatty acids 1000 MG capsule Take 1,500 mg by mouth every morning.     . Multiple Vitamins-Minerals (EQL MEGA SELECT MENS PO) Take 1 tablet by mouth every morning.     . pantoprazole (PROTONIX) 40 MG tablet Take 1 tablet (40 mg total) by mouth daily. (Patient taking differently: Take 40 mg by mouth every morning. ) 90 tablet 3  . Probiotic CAPS Take 1 capsule by mouth every morning. Taking PB8       Pending Test Results: Surgical pathology  Discharge Instructions:  1. Activity:  You are encouraged to ambulate frequently (about every hour during waking hours) to help prevent blood clots from forming in your legs or lungs.  However, you should not engage in any heavy lifting (> 10-15 lbs), strenuous activity, or straining. 2. Diet: You should continue a clear liquid diet until passing gas from below.  Once this occurs, you may advance your diet to a soft diet that would be easy to digest (i.e soups, scrambled eggs, mashed potatoes, etc.) for 24 hours just as you would if getting over a bad stomach flu.  If tolerating this diet well for 24 hours, you may then begin eating regular food.  It will be normal to have some amount of bloating, nausea, and abdominal discomfort intermittently. 3. Prescriptions:  You will be provided a prescription for pain medication to take as needed.  If your pain is not severe enough to require the prescription pain medication,  you may take extra strength Tylenol instead.  You should also take an over the counter stool softener (Colace 100 mg twice daily) to avoid straining with bowel movements as the pain medication may constipate you. Finally, you will also be provided a prescription for an antibiotic to begin the day prior to your return visit in the office for catheter removal. 4. Catheter care: You will  be taught how to take care of the catheter by the nursing staff prior to discharge from the hospital.  You may use both a leg bag and the larger bedside bag but it is recommended to at least use the bigger bedside bag at nighttime as the leg bag is small and will fill up overnight and also does not drain as well when lying flat. You may periodically feel a strong urge to void with the catheter in place.  This is a bladder spasm and most often can occur when having a bowel movement or when you are moving around. It is typically self-limited and usually will stop after a few minutes.  You may use some Vaseline or Neosporin around the tip of the catheter to reduce friction at the tip of the penis. 5. Incisions: You may remove your dressing bandages the 2nd day after surgery.  You most likely will have a few small staples in each of the incisions and once the bandages are removed, the incisions may stay open to air.  You may start showering (not soaking or bathing in water) 48 hours after surgery and the incisions simply need to be patted dry after the shower.  No additional care is needed. 6. What to call us about: You should call the office [phone]) if you develop fever > 101, persistent vomiting, or the catheter stops draining. Also, feel free to call with any other questions you may have and remember the handout that was provided to you as a reference preoperatively which answers many of the common questions that arise after surgery.   Follow-up:  Follow-up Information    Follow up with Ardis Hughs, MD On 01/29/2015.   Specialty:  Urology   Why:  12pm   Contact information:   [place] [id number] [phone]

## 2015-01-22 NOTE — Progress Notes (Signed)
Reinforced post op prostatectomy home care with wife and pt. Foley and leg bag care teaching and demonstration complete. Both pt and wife verbalized understanding. William Burns

## 2015-01-25 ENCOUNTER — Encounter (HOSPITAL_COMMUNITY): Payer: Self-pay | Admitting: Cardiology

## 2015-01-25 ENCOUNTER — Inpatient Hospital Stay (HOSPITAL_COMMUNITY)
Admission: EM | Admit: 2015-01-25 | Discharge: 2015-01-26 | DRG: 069 | Disposition: A | Discharge status: Home / Self Care | Payer: Medicare Other | Attending: Internal Medicine | Admitting: Internal Medicine

## 2015-01-25 ENCOUNTER — Emergency Department (HOSPITAL_COMMUNITY): Payer: Medicare Other

## 2015-01-25 DIAGNOSIS — R410 Disorientation, unspecified: Secondary | ICD-10-CM | POA: Diagnosis not present

## 2015-01-25 DIAGNOSIS — K219 Gastro-esophageal reflux disease without esophagitis: Secondary | ICD-10-CM | POA: Diagnosis not present

## 2015-01-25 DIAGNOSIS — I639 Cerebral infarction, unspecified: Secondary | ICD-10-CM | POA: Diagnosis not present

## 2015-01-25 DIAGNOSIS — Z9079 Acquired absence of other genital organ(s): Secondary | ICD-10-CM | POA: Diagnosis present

## 2015-01-25 DIAGNOSIS — Z9049 Acquired absence of other specified parts of digestive tract: Secondary | ICD-10-CM | POA: Diagnosis present

## 2015-01-25 DIAGNOSIS — R569 Unspecified convulsions: Secondary | ICD-10-CM | POA: Diagnosis not present

## 2015-01-25 DIAGNOSIS — Z820 Family history of epilepsy and other diseases of the nervous system: Secondary | ICD-10-CM | POA: Diagnosis not present

## 2015-01-25 DIAGNOSIS — I6523 Occlusion and stenosis of bilateral carotid arteries: Secondary | ICD-10-CM | POA: Diagnosis not present

## 2015-01-25 DIAGNOSIS — Z87891 Personal history of nicotine dependence: Secondary | ICD-10-CM | POA: Diagnosis not present

## 2015-01-25 DIAGNOSIS — E785 Hyperlipidemia, unspecified: Secondary | ICD-10-CM | POA: Diagnosis present

## 2015-01-25 DIAGNOSIS — N210 Calculus in bladder: Secondary | ICD-10-CM | POA: Diagnosis not present

## 2015-01-25 DIAGNOSIS — G459 Transient cerebral ischemic attack, unspecified: Principal | ICD-10-CM | POA: Diagnosis present

## 2015-01-25 DIAGNOSIS — R4182 Altered mental status, unspecified: Secondary | ICD-10-CM

## 2015-01-25 DIAGNOSIS — G454 Transient global amnesia: Secondary | ICD-10-CM | POA: Diagnosis present

## 2015-01-25 DIAGNOSIS — C61 Malignant neoplasm of prostate: Secondary | ICD-10-CM | POA: Diagnosis present

## 2015-01-25 LAB — DIFFERENTIAL
BASOS ABS: 0 10*3/uL (ref 0.0–0.1)
BASOS PCT: 0 % (ref 0–1)
EOS ABS: 0.7 10*3/uL (ref 0.0–0.7)
EOS PCT: 7 % — AB (ref 0–5)
LYMPHS ABS: 1.6 10*3/uL (ref 0.7–4.0)
Lymphocytes Relative: 15 % (ref 12–46)
MONO ABS: 0.8 10*3/uL (ref 0.1–1.0)
MONOS PCT: 7 % (ref 3–12)
Neutro Abs: 7.5 10*3/uL (ref 1.7–7.7)
Neutrophils Relative %: 71 % (ref 43–77)

## 2015-01-25 LAB — COMPREHENSIVE METABOLIC PANEL
ALT: 47 U/L (ref 17–63)
ANION GAP: 8 (ref 5–15)
AST: 47 U/L — AB (ref 15–41)
Albumin: 3.5 g/dL (ref 3.5–5.0)
Alkaline Phosphatase: 64 U/L (ref 38–126)
BILIRUBIN TOTAL: 0.8 mg/dL (ref 0.3–1.2)
BUN: 18 mg/dL (ref 6–20)
CHLORIDE: 104 mmol/L (ref 101–111)
CO2: 26 mmol/L (ref 22–32)
Calcium: 8.7 mg/dL — ABNORMAL LOW (ref 8.9–10.3)
Creatinine, Ser: 0.97 mg/dL (ref 0.61–1.24)
Glucose, Bld: 112 mg/dL — ABNORMAL HIGH (ref 65–99)
POTASSIUM: 3.8 mmol/L (ref 3.5–5.1)
Sodium: 138 mmol/L (ref 135–145)
TOTAL PROTEIN: 7.1 g/dL (ref 6.5–8.1)

## 2015-01-25 LAB — URINALYSIS, ROUTINE W REFLEX MICROSCOPIC
Bilirubin Urine: NEGATIVE
GLUCOSE, UA: NEGATIVE mg/dL
KETONES UR: NEGATIVE mg/dL
NITRITE: NEGATIVE
PH: 6 (ref 5.0–8.0)
Protein, ur: 30 mg/dL — AB
Specific Gravity, Urine: 1.025 (ref 1.005–1.030)
Urobilinogen, UA: 0.2 mg/dL (ref 0.0–1.0)

## 2015-01-25 LAB — CBC
HEMATOCRIT: 41.2 % (ref 39.0–52.0)
HEMOGLOBIN: 14 g/dL (ref 13.0–17.0)
MCH: 30.4 pg (ref 26.0–34.0)
MCHC: 34 g/dL (ref 30.0–36.0)
MCV: 89.4 fL (ref 78.0–100.0)
Platelets: 301 10*3/uL (ref 150–400)
RBC: 4.61 MIL/uL (ref 4.22–5.81)
RDW: 13.1 % (ref 11.5–15.5)
WBC: 10.6 10*3/uL — ABNORMAL HIGH (ref 4.0–10.5)

## 2015-01-25 LAB — URINE MICROSCOPIC-ADD ON

## 2015-01-25 LAB — I-STAT CHEM 8, ED
BUN: 18 mg/dL (ref 6–20)
Calcium, Ion: 1.15 mmol/L (ref 1.13–1.30)
Chloride: 101 mmol/L (ref 101–111)
Creatinine, Ser: 1 mg/dL (ref 0.61–1.24)
GLUCOSE: 109 mg/dL — AB (ref 65–99)
HEMATOCRIT: 42 % (ref 39.0–52.0)
HEMOGLOBIN: 14.3 g/dL (ref 13.0–17.0)
POTASSIUM: 3.8 mmol/L (ref 3.5–5.1)
Sodium: 139 mmol/L (ref 135–145)
TCO2: 23 mmol/L (ref 0–100)

## 2015-01-25 LAB — I-STAT TROPONIN, ED: TROPONIN I, POC: 0 ng/mL (ref 0.00–0.08)

## 2015-01-25 LAB — ETHANOL

## 2015-01-25 LAB — RAPID URINE DRUG SCREEN, HOSP PERFORMED
Amphetamines: NOT DETECTED
BARBITURATES: NOT DETECTED
BENZODIAZEPINES: NOT DETECTED
COCAINE: NOT DETECTED
OPIATES: NOT DETECTED
Tetrahydrocannabinol: NOT DETECTED

## 2015-01-25 LAB — PROTIME-INR
INR: 1.16 (ref 0.00–1.49)
Prothrombin Time: 15 seconds (ref 11.6–15.2)

## 2015-01-25 LAB — TSH: TSH: 2.609 u[IU]/mL (ref 0.350–4.500)

## 2015-01-25 LAB — APTT: APTT: 28 s (ref 24–37)

## 2015-01-25 MED ORDER — ENOXAPARIN SODIUM 40 MG/0.4ML ~~LOC~~ SOLN
40.0000 mg | SUBCUTANEOUS | Status: DC
  Administered 2015-01-26: 40 mg via SUBCUTANEOUS
  Filled 2015-01-25: qty 0.4

## 2015-01-25 MED ORDER — HYPROMELLOSE (GONIOSCOPIC) 2.5 % OP SOLN
2.0000 [drp] | Freq: Two times a day (BID) | OPHTHALMIC | Status: DC
  Filled 2015-01-25: qty 15

## 2015-01-25 MED ORDER — SODIUM CHLORIDE 0.9 % IV SOLN
INTRAVENOUS | Status: DC
  Administered 2015-01-26: via INTRAVENOUS

## 2015-01-25 MED ORDER — ONDANSETRON HCL 4 MG PO TABS: 4.0000 mg | ORAL_TABLET | Freq: Four times a day (QID) | ORAL | Status: DC | PRN

## 2015-01-25 MED ORDER — DOCUSATE SODIUM 100 MG PO CAPS
100.0000 mg | ORAL_CAPSULE | Freq: Two times a day (BID) | ORAL | Status: DC
  Administered 2015-01-26: 100 mg via ORAL
  Filled 2015-01-25 (×2): qty 1

## 2015-01-25 MED ORDER — ONDANSETRON HCL 4 MG/2ML IJ SOLN: 4.0000 mg | Freq: Four times a day (QID) | INTRAMUSCULAR | Status: DC | PRN

## 2015-01-25 MED ORDER — SENNA 8.6 MG PO TABS
2.0000 | ORAL_TABLET | Freq: Every day | ORAL | Status: DC
  Filled 2015-01-25: qty 2

## 2015-01-25 MED ORDER — PROBIOTIC PO CAPS
1.0000 | ORAL_CAPSULE | Freq: Every morning | ORAL | Status: DC
Start: 2015-01-26 — End: 2015-01-25

## 2015-01-25 MED ORDER — CARBOXYMETHYLCELLULOSE SODIUM 1 % OP SOLN: 2.0000 [drp] | Freq: Two times a day (BID) | OPHTHALMIC | Status: DC

## 2015-01-25 MED ORDER — HYDROCODONE-ACETAMINOPHEN 5-325 MG PO TABS: 1.0000 | ORAL_TABLET | Freq: Four times a day (QID) | ORAL | Status: DC | PRN

## 2015-01-25 MED ORDER — RISAQUAD PO CAPS
1.0000 | ORAL_CAPSULE | Freq: Every day | ORAL | Status: DC
  Administered 2015-01-26: 1 via ORAL
  Filled 2015-01-25: qty 1

## 2015-01-25 MED ORDER — PANTOPRAZOLE SODIUM 40 MG PO TBEC
40.0000 mg | DELAYED_RELEASE_TABLET | Freq: Every day | ORAL | Status: DC
  Administered 2015-01-26: 40 mg via ORAL
  Filled 2015-01-25: qty 1

## 2015-01-25 MED ORDER — PANTOPRAZOLE SODIUM 40 MG PO TBEC: 40.0000 mg | DELAYED_RELEASE_TABLET | Freq: Every day | ORAL | Status: DC

## 2015-01-25 NOTE — Progress Notes (Signed)
1743 Call from [facility] coming over [place] beeper goes off, announcing Code Stroke [place], RN, brings patient into CT room 1747 Lab comes in to CT to get blood, Magda Paganini starts the IV and draws blood for [employer] from [place] Magda Paganini leaves the CT room with the patient/ I finished the computer work on the patient in [place] at [facility] to announce Code Stroke 1757 Final in PACS, read and called by Dr Kerby Moors.

## 2015-01-25 NOTE — ED Provider Notes (Signed)
CSN: [id number]     Arrival date & time 01/25/15  1716 History   First MD Initiated Contact with Patient 01/25/15 1730     Chief Complaint  Patient presents with  . Altered Mental Status    An emergency department physician performed an initial assessment on this suspected stroke patient at 28. (Consider location/radiation/quality/duration/timing/severity/associated sxs/prior Treatment) Patient is a 69 y.o. male presenting with altered mental status. The history is provided by the spouse (pt took a shower at 2pm and was fine and after the shower he was confused.  pt had his prostate removed one week ago).  Altered Mental Status Presenting symptoms: confusion   Severity:  Moderate Most recent episode:  Today Episode history:  Single Timing:  Constant Progression:  Improving Chronicity:  New Context: not alcohol use     Past Medical History  Diagnosis Date  . BPH (benign prostatic hyperplasia)   . Kidney stones 2011  . Tooth infection 07/03/2011  . Colonic mass 07/03/2011  . Erosive gastritis 07/03/2011  . Renal calculus   . Cataract   . Cancer     colon  . Difficult intubation     extra long tube for intub per wife and slow to wake up   . GERD (gastroesophageal reflux disease)    Past Surgical History  Procedure Laterality Date  . Prostate biopsy    . Esophagogastroduodenoscopy  07/03/2011    Procedure: ESOPHAGOGASTRODUODENOSCOPY (EGD);  Surgeon: Rogene Houston, MD;  [facility];  Service: Endoscopy;  Laterality: N/A;  . Colonoscopy  07/03/2011    Procedure: COLONOSCOPY;  Surgeon: Rogene Houston, MD;  [facility];  Service: Endoscopy;  Laterality: N/A;  . Partial colectomy  07/04/2011    Procedure: PARTIAL COLECTOMY;  Surgeon: Jamesetta So, MD;  Location: [facility];  Service: General;  Laterality: N/A;  . Cystoscopy  07/09/2011    Procedure: Erlene Quan;  Surgeon: Marissa Nestle, MD;  Location: [facility];  Service: Urology;  Laterality: N/A;  .  Colon surgery    . Transurethral resection of prostate  07/11/2011    Procedure: TRANSURETHRAL RESECTION OF THE PROSTATE (TURP);  Surgeon: Marissa Nestle, MD;  Location: [facility];  Service: Urology;  Laterality: N/A;  . Cystoscopy with litholapaxy  07/11/2011    Procedure: CYSTOSCOPY WITH LITHOLAPAXY;  Surgeon: Marissa Nestle, MD;  Location: [facility];  Service: Urology;  Laterality: N/A;  . Cataract extraction w/phaco Left 08/23/2012    Procedure: CATARACT EXTRACTION PHACO AND INTRAOCULAR LENS PLACEMENT (IOC);  Surgeon: Tonny Branch, MD;  Location: [facility];  Service: Ophthalmology;  Laterality: Left;  CDE 16.64  . Colonoscopy N/A 09/16/2012    Procedure: COLONOSCOPY;  Surgeon: Rogene Houston, MD;  [facility]: [facility];  Service: Endoscopy;  Laterality: N/A;  930  . Cataract extraction w/phaco Right 06/09/2013    Procedure: CATARACT EXTRACTION PHACO AND INTRAOCULAR LENS PLACEMENT (IOC) CDE=10.86;  Surgeon: Tonny Branch, MD;  Location: [facility];  Service: Ophthalmology;  Laterality: Right;  . Prostage cancer    . Hernia repair  9833     umbilical   . Robot assisted laparoscopic radical prostatectomy Bilateral 01/19/2015    Procedure: ROBOTIC ASSISTED LAPAROSCOPIC RADICAL PROSTATECTOMY AND BILATERAL PELVIC LYMPH NODE DISSECTION, LAPARASCOPIC  LYSIS OF ADHESIONS;  Surgeon: Ardis Hughs, MD;  Location: [facility];  Service: Urology;  Laterality: Bilateral;   Family History  Problem Relation Age of Onset  . Alzheimer's disease Mother   . Alzheimer's disease  Father   . Aneurysm Brother     brain  . Anuerysm Brother   . Colon cancer Neg Hx   . Healthy Son   . Healthy Son    Social History  Substance Use Topics  . Smoking status: Former Smoker -- 2.00 packs/day for 25 years    Types: Cigarettes    Quit date: 06/02/1989  . Smokeless tobacco: Never Used  . Alcohol Use: No    Review of Systems  Unable to perform ROS: Mental status change  Psychiatric/Behavioral: Positive for confusion.       Allergies  Review of patient's allergies indicates no known allergies.  Home Medications   Prior to Admission medications   Medication Sig Start Date End Date Taking? Authorizing Provider  docusate sodium (COLACE) 100 MG capsule Take 1 capsule (100 mg total) by mouth 2 (two) times daily. 01/22/15   Star Age, MD  HYDROcodone-acetaminophen (NORCO) 5-325 MG per tablet Take 1-2 tablets by mouth every 6 (six) hours as needed. 01/19/15   Debbrah Alar, PA-C  pantoprazole (PROTONIX) 40 MG tablet Take 1 tablet (40 mg total) by mouth daily. Patient taking differently: Take 40 mg by mouth every morning.  05/15/14   Butch Penny, NP  Polyvinyl Alcohol-Povidone (REFRESH OP) Apply 1-2 drops to eye daily as needed (dust in eyes.).    Historical Provider, MD  Probiotic CAPS Take 1 capsule by mouth every morning. Taking PB8    Historical Provider, MD  senna (SENOKOT) 8.6 MG TABS tablet Take 2 tablets (17.2 mg total) by mouth daily. 01/22/15   Star Age, MD  sulfamethoxazole-trimethoprim (BACTRIM DS,SEPTRA DS) 800-160 MG per tablet Take 1 tablet by mouth 2 (two) times daily. Start the day prior to foley removal appointment 01/19/15   Debbrah Alar, PA-C   BP 148/76 mmHg  Pulse 94  Temp(Src) 98.6 F (37 C) (Oral)  Resp 17  Ht 6\' 3"  (1.905 m)  Wt 250 lb (113.399 kg)  BMI 31.25 kg/m2  SpO2 95% Physical Exam  Constitutional: He appears well-developed.  HENT:  Head: Normocephalic.  Eyes: Conjunctivae and EOM are normal. No scleral icterus.  Neck: Neck supple. No thyromegaly present.  Cardiovascular: Normal rate and regular rhythm.  Exam reveals no gallop and no friction rub.   No murmur heard. Pulmonary/Chest: No stridor. He has no wheezes. He has no rales. He exhibits no tenderness.  Abdominal: He exhibits no distension. There is no tenderness. There is no rebound.  Musculoskeletal: Normal range of motion. He exhibits no edema.  Lymphadenopathy:    He has no cervical adenopathy.   Neurological: He is alert. He exhibits normal muscle tone. Coordination normal.  Oriented to person and place only  Skin: No rash noted. No erythema.  Psychiatric: He has a normal mood and affect. His behavior is normal.    ED Course  Procedures (including critical care time) Labs Review Labs Reviewed  CBC - Abnormal; Notable for the following:    WBC 10.6 (*)    All other components within normal limits  DIFFERENTIAL - Abnormal; Notable for the following:    Eosinophils Relative 7 (*)    All other components within normal limits  COMPREHENSIVE METABOLIC PANEL - Abnormal; Notable for the following:    Glucose, Bld 112 (*)    Calcium 8.7 (*)    AST 47 (*)    All other components within normal limits  URINALYSIS, ROUTINE W REFLEX MICROSCOPIC (NOT AT [facility]) - Abnormal; Notable for the following:  Hgb urine dipstick LARGE (*)    Protein, ur 30 (*)    Leukocytes, UA TRACE (*)    All other components within normal limits  I-STAT CHEM 8, ED - Abnormal; Notable for the following:    Glucose, Bld 109 (*)    All other components within normal limits  ETHANOL  PROTIME-INR  APTT  URINE RAPID DRUG SCREEN, HOSP PERFORMED  URINE MICROSCOPIC-ADD ON  I-STAT TROPOININ, ED    Imaging Review Ct Head Wo Contrast  01/25/2015   CLINICAL DATA:  Prostate surgery on Friday. Confusion which began today.  EXAM: CT HEAD WITHOUT CONTRAST  TECHNIQUE: Contiguous axial images were obtained from the base of the skull through the vertex without intravenous contrast.  COMPARISON:  11/22/2012  FINDINGS: There is a focal area of low attenuation within the left frontal lobe periventricular white matter which is new from previous exam and is consistent with subacute or chronic subcortical infarct. Mild prominence of the sulci and ventricles identified consistent with brain atrophy. There is no evidence for acute intracranial hemorrhage, mass or acute cortical infarct. There is partial opacification of the ethmoid  air cells. The mastoid air cells are clear. The calvarium appears intact.  IMPRESSION: 1. No acute intracranial abnormalities. 2. Subacute to chronic left frontal lobe subcortical infarct is new from 2014.   Electronically Signed   By: Kerby Moors M.D.   On: 01/25/2015 17:57   I have personally reviewed and evaluated these images and lab results as part of my medical decision-making.   EKG Interpretation   Date/Time:  Thursday January 25 2015 18:00:58 EDT Ventricular Rate:  84 PR Interval:  188 QRS Duration: 98 QT Interval:  372 QTC Calculation: 440 R Axis:   -16 Text Interpretation:  Sinus rhythm Borderline left axis deviation  Confirmed by Atiyana Welte  MD, Kiesha Ensey [phone]) on 01/25/2015 8:12:54 PM     CRITICAL CARE Performed by: Shakeem Stern L Total critical care time: 40 Critical care time was exclusive of separately billable procedures and treating other patients. Critical care was necessary to treat or prevent imminent or life-threatening deterioration. Critical care was time spent personally by me on the following activities: development of treatment plan with patient and/or surrogate as well as nursing, discussions with consultants, evaluation of patient's response to treatment, examination of patient, obtaining history from patient or surrogate, ordering and performing treatments and interventions, ordering and review of laboratory studies, ordering and review of radiographic studies, pulse oximetry and re-evaluation of patient's condition.   MDM   Final diagnoses:  Altered mental status, unspecified altered mental status type   Code stroke called Pt seen by neurology and dx with transient global amnesia,   Pt is admitted to medicine for continued work up.  pt has returned to normal    Milton Ferguson, MD 01/25/15 2014

## 2015-01-25 NOTE — H&P (Signed)
[facility] History and Physical  [name] RSW:[id number] DOB: [birth year] DOA: 01/25/2015  Referring physician: ER PCP: [name], MD   Chief Complaint: Altered mental status  HPI: William Burns is a 69 y.o. male  This is a 69 year old man who recently had robotic-assisted laparoscopic radical prostatectomy and bilateral pelvic lymph node dissection on 01/19/2015. He was discharged home a few days ago (3 days ago) and seemed to be doing well. Today after he came out of the shower at approximately 3 PM, he became rather confused and did not remember anything about his surgery or any other events. After about 2 hours, he returned back to his usual self. He has been taking hydrocodone for pain but he has experienced taking hydrocodone previously also. There was no head injury. There was no nausea or vomiting. There is been no limb weakness or speech difficulties. There is been no dysphasia. Patient has been one of memory loss. He is now being admitted for further investigation.   Review of Systems:  Apart from symptoms above, all systems negative.  Past Medical History  Diagnosis Date  . BPH (benign prostatic hyperplasia)   . Kidney stones 2011  . Tooth infection 07/03/2011  . Colonic mass 07/03/2011  . Erosive gastritis 07/03/2011  . Renal calculus   . Cataract   . Cancer     colon  . Difficult intubation     extra long tube for intub per wife and slow to wake up   . GERD (gastroesophageal reflux disease)    Past Surgical History  Procedure Laterality Date  . Prostate biopsy    . Esophagogastroduodenoscopy  07/03/2011    Procedure: ESOPHAGOGASTRODUODENOSCOPY (EGD);  Surgeon: Rogene Houston, MD;  [facility];  Service: Endoscopy;  Laterality: N/A;  . Colonoscopy  07/03/2011    Procedure: COLONOSCOPY;  Surgeon: Rogene Houston, MD;  [facility];  Service: Endoscopy;  Laterality: N/A;  . Partial colectomy  07/04/2011    Procedure: PARTIAL  COLECTOMY;  Surgeon: Jamesetta So, MD;  Location: [facility];  Service: General;  Laterality: N/A;  . Cystoscopy  07/09/2011    Procedure: Erlene Quan;  Surgeon: Marissa Nestle, MD;  [facility];  Service: Urology;  Laterality: N/A;  . Colon surgery    . Transurethral resection of prostate  07/11/2011    Procedure: TRANSURETHRAL RESECTION OF THE PROSTATE (TURP);  Surgeon: Marissa Nestle, MD;  Location: [facility];  Service: Urology;  Laterality: N/A;  . Cystoscopy with litholapaxy  07/11/2011    Procedure: CYSTOSCOPY WITH LITHOLAPAXY;  Surgeon: Marissa Nestle, MD;  [facility];  Service: Urology;  Laterality: N/A;  . Cataract extraction w/phaco Left 08/23/2012    Procedure: CATARACT EXTRACTION PHACO AND INTRAOCULAR LENS PLACEMENT (IOC);  Surgeon: Tonny Branch, MD;  Location: [facility];  Service: Ophthalmology;  Laterality: Left;  CDE 16.64  . Colonoscopy N/A 09/16/2012    Procedure: COLONOSCOPY;  Surgeon: Rogene Houston, MD;  [facility];  Service: Endoscopy;  Laterality: N/A;  930  . Cataract extraction w/phaco Right 06/09/2013    Procedure: CATARACT EXTRACTION PHACO AND INTRAOCULAR LENS PLACEMENT (IOC) CDE=10.86;  Surgeon: Tonny Branch, MD;  Location: [facility];  Service: Ophthalmology;  Laterality: Right;  . Prostage cancer    . Hernia repair  0938     umbilical   . Robot assisted laparoscopic radical prostatectomy Bilateral 01/19/2015    Procedure: ROBOTIC ASSISTED LAPAROSCOPIC RADICAL PROSTATECTOMY AND BILATERAL PELVIC LYMPH  NODE DISSECTION, LAPARASCOPIC  LYSIS OF ADHESIONS;  Surgeon: Ardis Hughs, MD;  Location: [facility];  Service: Urology;  Laterality: Bilateral;   Social History:  reports that he quit smoking about 25 years ago. His smoking use included Cigarettes. He has a 50 pack-year smoking history. He has never used smokeless tobacco. He reports that he does not drink alcohol or use illicit drugs.  No Known Allergies  Family History  Problem Relation Age of  Onset  . Alzheimer's disease Mother   . Alzheimer's disease Father   . Aneurysm Brother     brain  . Anuerysm Brother   . Colon cancer Neg Hx   . Healthy Son   . Healthy Son     Prior to Admission medications   Medication Sig Start Date End Date Taking? Authorizing Provider  docusate sodium (COLACE) 100 MG capsule Take 1 capsule (100 mg total) by mouth 2 (two) times daily. 01/22/15  Yes Star Age, MD  HYDROcodone-acetaminophen (NORCO) 5-325 MG per tablet Take 1-2 tablets by mouth every 6 (six) hours as needed. 01/19/15  Yes Amanda Dancy, PA-C  pantoprazole (PROTONIX) 40 MG tablet Take 1 tablet (40 mg total) by mouth daily. Patient taking differently: Take 40 mg by mouth every morning.  05/15/14  Yes Butch Penny, NP  Polyvinyl Alcohol-Povidone (REFRESH OP) Apply 1-2 drops to eye daily as needed (dust in eyes.).   Yes Historical Provider, MD  Probiotic CAPS Take 1 capsule by mouth every morning. Taking PB8   Yes Historical Provider, MD  senna (SENOKOT) 8.6 MG TABS tablet Take 2 tablets (17.2 mg total) by mouth daily. Patient taking differently: Take 2 tablets by mouth daily as needed for mild constipation.  01/22/15  Yes Star Age, MD  sulfamethoxazole-trimethoprim (BACTRIM DS,SEPTRA DS) 800-160 MG per tablet Take 1 tablet by mouth 2 (two) times daily. Start the day prior to foley removal appointment Patient not taking: Reported on 01/25/2015 01/19/15   Debbrah Alar, PA-C   Physical Exam: Filed Vitals:   01/25/15 2030 01/25/15 2039 01/25/15 2045 01/25/15 2100  BP: 127/70 127/70 130/66 126/65  Pulse: 77 73 76 70  Temp:      TempSrc:      Resp:  16  21  Height:      Weight:      SpO2: 97% 93% 92% 93%    Wt Readings from Last 3 Encounters:  01/25/15 113.399 kg (250 lb)  01/19/15 114.306 kg (252 lb)  01/16/15 114.306 kg (252 lb)    General:  Appears calm and comfortable. He is alert and oriented. Eyes: PERRL, normal lids, irises & conjunctiva ENT: grossly normal hearing,  lips & tongue Neck: no LAD, masses or thyromegaly Cardiovascular: RRR, no m/r/g. No LE edema. Telemetry: SR, no arrhythmias  Respiratory: CTA bilaterally, no w/r/r. Normal respiratory effort. Abdomen: soft, ntnd Skin: no rash or induration seen on limited exam Musculoskeletal: grossly normal tone BUE/BLE Psychiatric: grossly normal mood and affect, speech fluent and appropriate Neurologic: grossly non-focal.          Labs on Admission:  Basic Metabolic Panel:  Recent Labs Lab 01/19/15 2035 01/20/15 0529 01/25/15 1749 01/25/15 1807  NA  --  135 138 139  K  --  4.2 3.8 3.8  CL  --  103 104 101  CO2  --  27 26  --   GLUCOSE  --  159* 112* 109*  BUN  --  18 18 18   CREATININE 1.25* 1.05 0.97 1.00  CALCIUM  --  8.2* 8.7*  --    Liver Function Tests:  Recent Labs Lab 01/25/15 1749  AST 47*  ALT 47  ALKPHOS 64  BILITOT 0.8  PROT 7.1  ALBUMIN 3.5   No results for input(s): LIPASE, AMYLASE in the last 168 hours. No results for input(s): AMMONIA in the last 168 hours. CBC:  Recent Labs Lab 01/19/15 2035 01/20/15 0529 01/22/15 0539 01/25/15 1749 01/25/15 1807  WBC 14.8*  --  8.3 10.6*  --   NEUTROABS  --   --   --  7.5  --   HGB 13.9 13.0 12.2* 14.0 14.3  HCT 41.4 39.7 36.9* 41.2 42.0  MCV 88.7  --  91.1 89.4  --   PLT 246  --  240 301  --    Cardiac Enzymes: No results for input(s): CKTOTAL, CKMB, CKMBINDEX, TROPONINI in the last 168 hours.  BNP (last 3 results) No results for input(s): BNP in the last 8760 hours.  ProBNP (last 3 results) No results for input(s): PROBNP in the last 8760 hours.  CBG: No results for input(s): GLUCAP in the last 168 hours.  Radiological Exams on Admission: Ct Head Wo Contrast  01/25/2015   CLINICAL DATA:  Prostate surgery on Friday. Confusion which began today.  EXAM: CT HEAD WITHOUT CONTRAST  TECHNIQUE: Contiguous axial images were obtained from the base of the skull through the vertex without intravenous contrast.   COMPARISON:  11/22/2012  FINDINGS: There is a focal area of low attenuation within the left frontal lobe periventricular white matter which is new from previous exam and is consistent with subacute or chronic subcortical infarct. Mild prominence of the sulci and ventricles identified consistent with brain atrophy. There is no evidence for acute intracranial hemorrhage, mass or acute cortical infarct. There is partial opacification of the ethmoid air cells. The mastoid air cells are clear. The calvarium appears intact.  IMPRESSION: 1. No acute intracranial abnormalities. 2. Subacute to chronic left frontal lobe subcortical infarct is new from 2014.   Electronically Signed   By: Kerby Moors M.D.   On: 01/25/2015 17:57    EKG: Independently reviewed. Sinus rhythm without any acute ST-T wave changes.  Assessment/Plan   1. Transient global amnesia. His CT brain scan is abnormal with subacute to chronic left frontal lobe subcortical infarct. This may not explain his present symptoms but we will obtain MRI brain scan. I will request neurology consultation. 2. Recent prostatectomy for prostate cancer.  Further recommendations will depend on patient's hospital progress.   Code Status: Full code  DVT Prophylaxis: Lovenox.  Family Communication: I discussed the plan with the patient at the bedside.   Disposition Plan: Home when medically stable.   Time spent: 60 minutes.  Doree Albee [facility] Pager [phone].

## 2015-01-25 NOTE — ED Notes (Signed)
Code stroke is now deceased and is now confirmed to TIA

## 2015-01-25 NOTE — ED Notes (Signed)
Patient transported to CT. Lana Fish, RN transporting with pt.

## 2015-01-25 NOTE — ED Notes (Signed)
Called SOC and paged out a code stroke as per doctor

## 2015-01-25 NOTE — ED Notes (Signed)
Had prostate surgery Friday at [facility].  Confusion that started today.

## 2015-01-25 NOTE — ED Notes (Signed)
Teleneuro MD consulting via camera in pt room.

## 2015-01-26 ENCOUNTER — Inpatient Hospital Stay (HOSPITAL_COMMUNITY): Payer: Medicare Other

## 2015-01-26 DIAGNOSIS — Z87891 Personal history of nicotine dependence: Secondary | ICD-10-CM | POA: Diagnosis not present

## 2015-01-26 DIAGNOSIS — E785 Hyperlipidemia, unspecified: Secondary | ICD-10-CM

## 2015-01-26 DIAGNOSIS — R569 Unspecified convulsions: Secondary | ICD-10-CM | POA: Diagnosis not present

## 2015-01-26 DIAGNOSIS — N210 Calculus in bladder: Secondary | ICD-10-CM | POA: Diagnosis not present

## 2015-01-26 DIAGNOSIS — R410 Disorientation, unspecified: Secondary | ICD-10-CM | POA: Diagnosis not present

## 2015-01-26 DIAGNOSIS — R4182 Altered mental status, unspecified: Secondary | ICD-10-CM | POA: Diagnosis not present

## 2015-01-26 DIAGNOSIS — K219 Gastro-esophageal reflux disease without esophagitis: Secondary | ICD-10-CM | POA: Diagnosis not present

## 2015-01-26 DIAGNOSIS — G454 Transient global amnesia: Secondary | ICD-10-CM

## 2015-01-26 DIAGNOSIS — C61 Malignant neoplasm of prostate: Secondary | ICD-10-CM

## 2015-01-26 DIAGNOSIS — Z820 Family history of epilepsy and other diseases of the nervous system: Secondary | ICD-10-CM | POA: Diagnosis not present

## 2015-01-26 DIAGNOSIS — G459 Transient cerebral ischemic attack, unspecified: Secondary | ICD-10-CM

## 2015-01-26 DIAGNOSIS — I6523 Occlusion and stenosis of bilateral carotid arteries: Secondary | ICD-10-CM | POA: Diagnosis not present

## 2015-01-26 LAB — CBC
HCT: 37.5 % — ABNORMAL LOW (ref 39.0–52.0)
HEMOGLOBIN: 12.5 g/dL — AB (ref 13.0–17.0)
MCH: 30 pg (ref 26.0–34.0)
MCHC: 33.3 g/dL (ref 30.0–36.0)
MCV: 90.1 fL (ref 78.0–100.0)
Platelets: 301 10*3/uL (ref 150–400)
RBC: 4.16 MIL/uL — AB (ref 4.22–5.81)
RDW: 13.1 % (ref 11.5–15.5)
WBC: 8.7 10*3/uL (ref 4.0–10.5)

## 2015-01-26 LAB — COMPREHENSIVE METABOLIC PANEL
ALK PHOS: 53 U/L (ref 38–126)
ALT: 38 U/L (ref 17–63)
ANION GAP: 6 (ref 5–15)
AST: 35 U/L (ref 15–41)
Albumin: 2.9 g/dL — ABNORMAL LOW (ref 3.5–5.0)
BUN: 16 mg/dL (ref 6–20)
CALCIUM: 8.4 mg/dL — AB (ref 8.9–10.3)
CO2: 28 mmol/L (ref 22–32)
Chloride: 104 mmol/L (ref 101–111)
Creatinine, Ser: 0.92 mg/dL (ref 0.61–1.24)
GFR calc non Af Amer: >60 mL/min (ref >60)
Glucose, Bld: 108 mg/dL — ABNORMAL HIGH (ref 65–99)
Potassium: 3.7 mmol/L (ref 3.5–5.1)
SODIUM: 138 mmol/L (ref 135–145)
Total Bilirubin: 0.8 mg/dL (ref 0.3–1.2)
Total Protein: 6 g/dL — ABNORMAL LOW (ref 6.5–8.1)

## 2015-01-26 LAB — LIPID PANEL
CHOL/HDL RATIO: 6.4 ratio
CHOLESTEROL: 167 mg/dL (ref 0–200)
HDL: 26 mg/dL — AB (ref >40)
LDL Cholesterol: 111 mg/dL — ABNORMAL HIGH (ref 0–99)
TRIGLYCERIDES: 151 mg/dL — AB (ref <150)
VLDL: 30 mg/dL (ref 0–40)

## 2015-01-26 MED ORDER — ASPIRIN 81 MG PO TBEC: 81.0000 mg | DELAYED_RELEASE_TABLET | Freq: Every day | ORAL | Status: DC

## 2015-01-26 MED ORDER — ATORVASTATIN CALCIUM 10 MG PO TABS: 10.0000 mg | ORAL_TABLET | Freq: Every day | ORAL | Status: DC

## 2015-01-26 MED ORDER — ASPIRIN EC 81 MG PO TBEC
81.0000 mg | DELAYED_RELEASE_TABLET | Freq: Every day | ORAL | Status: DC
  Administered 2015-01-26: 81 mg via ORAL
  Filled 2015-01-26: qty 1

## 2015-01-26 NOTE — Evaluation (Signed)
Physical Therapy Evaluation Patient Details Name: William Burns MRN: [id number] DOB: 1945/06/19 Today's Date: 01/26/2015   History of Present Illness  This is a 69 year old man who recently had robotic-assisted laparoscopic radical prostatectomy and bilateral pelvic lymph node dissection on 01/19/2015. He was discharged home a few days ago (3 days ago) and seemed to be doing well. Today after he came out of the shower at approximately 3 PM, he became rather confused and did not remember anything about his surgery or any other events. After about 2 hours, he returned back to his usual self. He has been taking hydrocodone for pain but he has experienced taking hydrocodone previously also. There was no head injury. There was no nausea or vomiting. There is been no limb weakness or speech difficulties. There is been no dysphasia. Patient has been one of memory loss. He is now being admitted for further investigation.  Clinical Impression   Pt was seen for evaluation.  He was alert and oriented, reports that his initial memory loss has fully returned.  He has mild deconditioning due to recent prostate surgery but intrinsic strength, balance and coordination are all WNL.  His gait is stable with no assistive device.  No further PT should be needed.    Follow Up Recommendations No PT follow up    Equipment Recommendations  None recommended by PT    Recommendations for Other Services   none    Precautions / Restrictions Precautions Precautions: None Restrictions Weight Bearing Restrictions: No      Mobility  Bed Mobility Overal bed mobility: Independent                Transfers Overall transfer level: Independent                  Ambulation/Gait Ambulation/Gait assistance: Independent Ambulation Distance (Feet): 150 Feet Assistive device: None Gait Pattern/deviations: WFL(Within Functional Limits) Gait velocity: gait speed is slowed due to recent prostate  surgery...abdomen is still very tender Gait velocity interpretation: Below normal speed for age/gender    Stairs            Wheelchair Mobility    Modified Rankin (Stroke Patients Only)       Balance Overall balance assessment: Independent                                           Pertinent Vitals/Pain Pain Assessment: No/denies pain    Home Living Family/patient expects to be discharged to:: Private residence Living Arrangements: Spouse/significant other Available Help at Discharge: Family;Available 24 hours/day Type of Home: House Home Access: Stairs to enter Entrance Stairs-Rails: None Entrance Stairs-Number of Steps: 1 Home Layout: One level Home Equipment: None      Prior Function Level of Independence: Independent               Hand Dominance   Dominant Hand: Right    Extremity/Trunk Assessment   Upper Extremity Assessment: Overall WFL for tasks assessed           Lower Extremity Assessment: Overall WFL for tasks assessed      Cervical / Trunk Assessment: Normal  Communication   Communication: No difficulties  Cognition Arousal/Alertness: Awake/alert   Overall Cognitive Status: Within Functional Limits for tasks assessed                      General  Comments      Exercises        Assessment/Plan    PT Assessment Patent does not need any further PT services  PT Diagnosis     PT Problem List    PT Treatment Interventions     PT Goals (Current goals can be found in the Care Plan section) Acute Rehab PT Goals PT Goal Formulation: All assessment and education complete, DC therapy    Frequency     Barriers to discharge  none      Co-evaluation               End of Session Equipment Utilized During Treatment: Gait belt Activity Tolerance: Patient tolerated treatment well Patient left: in chair;with call bell/phone within reach;with family/visitor present Nurse Communication: Mobility  status    Functional Assessment Tool Used: clinical judgement Functional Limitation: Mobility: Walking and moving around Mobility: Walking and Moving Around Current Status [phone]): 0 percent impaired, limited or restricted Mobility: Walking and Moving Around Goal Status [phone]): 0 percent impaired, limited or restricted Mobility: Walking and Moving Around Discharge Status [phone]): 0 percent impaired, limited or restricted    Time: 1215-1248 PT Time Calculation (min) (ACUTE ONLY): 33 min   Charges:   PT Evaluation $Initial PT Evaluation Tier I: 1 Procedure     PT G Codes:   PT G-Codes **NOT FOR INPATIENT CLASS** Functional Assessment Tool Used: clinical judgement Functional Limitation: Mobility: Walking and moving around Mobility: Walking and Moving Around Current Status (N2355): 0 percent impaired, limited or restricted Mobility: Walking and Moving Around Goal Status (D3220): 0 percent impaired, limited or restricted Mobility: Walking and Moving Around Discharge Status (U5427): 0 percent impaired, limited or restricted    Sable Feil  PT 01/26/2015, 12:58 PM [phone]

## 2015-01-26 NOTE — Progress Notes (Signed)
[facility] PROGRESS NOTE  William Burns UVO:[id number] DOB: Dec 28, 1945 DOA: 01/25/2015 PCP: [name], MD  Assessment/Plan: 1. Transient global amnesia, possibly TIA. Patient does not have any focal neurologic deficits. Symptoms have resolved and mental status is at baseline. MRI brain negative for acute infarct. Neurology consultation pending. Will order Carotid Doppler and ECHO. Will start low dose aspirin. Will check Hgb A1c and lipid panel. Physical therapy will be consulted. Can consider outpatient EEG if work up is unrevealing. 2. Recent prostatectomy for prostate cancer. Follow up with Urology. 3. GERD. Continue PPI.   Code Status: Full code DVT Prophylaxis: Lovenox. Family Communication: I discussed the plan with the patient and wife at the bedside.  Disposition Plan: Home when medically stable.    Consultants:  PT  Neurology  Procedures:    Antibiotics:    HPI/Subjective: Pt's wife reports confusion, weakness, and amnesia of typical events at home s/p prostatectomy with anesthesiology. Wife denies slurred speech or facial droop. Patient states that he feels normal today. No new meds, sedatives, or aspirin. No similar symptoms in the past. He is concerned he may have dementia as his mother and father had a history of it.  Objective: Filed Vitals:   01/26/15 0510  BP: 142/67  Pulse: 69  Temp: 98.4 F (36.9 C)  Resp: 18    Intake/Output Summary (Last 24 hours) at 01/26/15 1106 Last data filed at 01/26/15 0515  Gross per 24 hour  Intake      0 ml  Output    550 ml  Net   -550 ml   Filed Weights   01/25/15 1725 01/25/15 2153  Weight: 113.399 kg (250 lb) 108.4 kg (238 lb 15.7 oz)    Exam:  General:  Appears comfortable, calm. Cardiovascular: Regular rate and rhythm, no murmur, rub or gallop. No lower extremity edema. ENT: PERRL. Normal conjunctiva. Normal Irises  Telemetry: Sinus rhythm, no arrhythmias  Respiratory: Clear to auscultation  bilaterally, no wheezes, rales or rhonchi. Normal respiratory effort. Abdomen: soft, ntnd, Dressing over LLQ, multiple small incisions over abdomen consistent with recent surgery.  Skin: no rash or induration  Musculoskeletal: grossly normal tone bilateral upper and lower extremities Psychiatric: grossly normal mood and affect, speech fluent and appropriate Neurologic: grossly non-focal.  Data Reviewed: Basic Metabolic Panel:  Recent Labs Lab 01/19/15 2035 01/20/15 0529 01/25/15 1749 01/25/15 1807 01/26/15 0623  NA  --  135 138 139 138  K  --  4.2 3.8 3.8 3.7  CL  --  103 104 101 104  CO2  --  27 26  --  28  GLUCOSE  --  159* 112* 109* 108*  BUN  --  18 18 18 16   CREATININE 1.25* 1.05 0.97 1.00 0.92  CALCIUM  --  8.2* 8.7*  --  8.4*   Liver Function Tests:  Recent Labs Lab 01/25/15 1749 01/26/15 0623  AST 47* 35  ALT 47 38  ALKPHOS 64 53  BILITOT 0.8 0.8  PROT 7.1 6.0*  ALBUMIN 3.5 2.9*   No results for input(s): LIPASE, AMYLASE in the last 168 hours. No results for input(s): AMMONIA in the last 168 hours. CBC:  Recent Labs Lab 01/19/15 2035 01/20/15 0529 01/22/15 0539 01/25/15 1749 01/25/15 1807 01/26/15 0623  WBC 14.8*  --  8.3 10.6*  --  8.7  NEUTROABS  --   --   --  7.5  --   --   HGB 13.9 13.0 12.2* 14.0 14.3 12.5*  HCT 41.4  39.7 36.9* 41.2 42.0 37.5*  MCV 88.7  --  91.1 89.4  --  90.1  PLT 246  --  240 301  --  301   Cardiac Enzymes: No results for input(s): CKTOTAL, CKMB, CKMBINDEX, TROPONINI in the last 168 hours. BNP (last 3 results) No results for input(s): BNP in the last 8760 hours.  ProBNP (last 3 results) No results for input(s): PROBNP in the last 8760 hours.  CBG: No results for input(s): GLUCAP in the last 168 hours.  Recent Results (from the past 240 hour(s))  Urine culture     Status: None   Collection Time: 01/16/15  3:00 PM  Result Value Ref Range Status   Specimen Description URINE, RANDOM  Final   Special Requests NONE   Final   Culture   Final    NO GROWTH 2 DAYS Performed at [facility]    Report Status 01/18/2015 FINAL  Final     Studies: Ct Head Wo Contrast  01/25/2015   CLINICAL DATA:  Prostate surgery on Friday. Confusion which began today.  EXAM: CT HEAD WITHOUT CONTRAST  TECHNIQUE: Contiguous axial images were obtained from the base of the skull through the vertex without intravenous contrast.  COMPARISON:  11/22/2012  FINDINGS: There is a focal area of low attenuation within the left frontal lobe periventricular white matter which is new from previous exam and is consistent with subacute or chronic subcortical infarct. Mild prominence of the sulci and ventricles identified consistent with brain atrophy. There is no evidence for acute intracranial hemorrhage, mass or acute cortical infarct. There is partial opacification of the ethmoid air cells. The mastoid air cells are clear. The calvarium appears intact.  IMPRESSION: 1. No acute intracranial abnormalities. 2. Subacute to chronic left frontal lobe subcortical infarct is new from 2014.   Electronically Signed   By: Kerby Moors M.D.   On: 01/25/2015 17:57   Mr Brain Wo Contrast  01/26/2015   CLINICAL DATA:  69 year old male with confusion following prostate surgery last week. Initial encounter.  EXAM: MRI HEAD WITHOUT CONTRAST  TECHNIQUE: Multiplanar, multiecho pulse sequences of the brain and surrounding structures were obtained without intravenous contrast.  COMPARISON:  Head CT without contrast 01/25/2015 and earlier.  FINDINGS: Cerebral volume is within normal limits for age. No restricted diffusion to suggest acute infarction. No midline shift, mass effect, evidence of mass lesion, ventriculomegaly, extra-axial collection or acute intracranial hemorrhage. Cervicomedullary junction and pituitary are within normal limits. Major intracranial vascular flow voids are preserved, there is a mild degree of intracranial artery dolichoectasia.  Minimal to  mild for age mostly periventricular cerebral white matter T2 and FLAIR hyperintensity, nonspecific. No cortical encephalomalacia or chronic cerebral blood products identified. Deep gray matter nuclei, brainstem, and cerebellum are within normal limits for age.  Visible internal auditory structures appear normal. Mastoids are clear. Mild paranasal sinus mucosal thickening. Postoperative changes to the globes. Negative orbit and scalp soft tissues. Normal bone marrow signal. Negative visualized cervical spine.  IMPRESSION: No acute intracranial abnormality, and largely unremarkable for age noncontrast MRI appearance of the brain.   Electronically Signed   By: Genevie Ann M.D.   On: 01/26/2015 08:55    Scheduled Meds: . acidophilus  1 capsule Oral Daily  . docusate sodium  100 mg Oral BID  . enoxaparin (LOVENOX) injection  40 mg Subcutaneous Q24H  . hydroxypropyl methylcellulose / hypromellose  2 drop Both Eyes BID  . pantoprazole  40 mg Oral Daily  .  senna  2 tablet Oral Daily   Continuous Infusions: . sodium chloride 50 mL/hr at 01/26/15 0024    Active Problems:   Prostate cancer   Transient global amnesia    Time spent: 30 minutes    Kathie Dike, M.D.  [facility] Pager [phone]. If 7PM-7AM, please contact night-coverage at [email or web address], password [facility] 01/26/2015, 11:06 AM  LOS: 1 day     I, Rhett Bannister, acting a scribe, recorded this note contemporaneously in the presence of Dr. Kathie Dike, M.D. on 01/26/2015 at 11:11 AM   I have reviewed the above documentation for accuracy and completeness, and I agree with the above.  MEMON,JEHANZEB

## 2015-01-26 NOTE — Care Management Important Message (Signed)
Important Message  Patient Details  Name: William Burns MRN: [id number] Date of Birth: 10-Oct-1945   Medicare Important Message Given:  Yes-second notification given    Joylene Draft, RN 01/26/2015, 10:56 AM

## 2015-01-26 NOTE — Consult Note (Signed)
Monroe A. Merlene Laughter, MD     [email or web address]          William Burns is an 69 y.o. male.   ASSESSMENT/PLAN:  1. Episode of confusion and altered mental status unclear etiology. Differential diagnosis includes complex partial seizures and TIA. Medication effect is also a possibility.   RECOMMENDATION: Low-dose aspirin 81 mg daily. EEG. Minimize sedative medications.  The patient is a 69 year old white male who underwent a prostatectomy about a week ago on Friday. He did relatively well although wife reports that he had massive swelling of the left upper extremity after surgery and did have some weakness there. The patient woke up from surgery and thought he had a stroke. Her weakness improved on Monday and he appears of done well until the day of admission yesterday when he became acutely disoriented and amnestic for about an hour and a half. He was confused and had significant short-term amnesia and kept repeating things. There is no loss of consciousness. No focal numbness or weakness. No dysarthria or dysphasia. No chest pain or shortness of breath. The review of systems otherwise negative. He seemed to have returned to baseline today. The wife reports that a few years ago in about 2014 he had events where he hit his head and was confused afterwards.  GENERAL: Pleasant in no acute distress.  HEENT: Supple. Atraumatic normocephalic.   ABDOMEN: soft  EXTREMITIES: No edema   BACK: Normal.  SKIN: Normal by inspection.    MENTAL STATUS: Alert and oriented. Speech, language and cognition are generally intact. Judgment and insight normal.   CRANIAL NERVES: Pupils are equal, round and reactive to light and accommodation; extra ocular movements are full, there is no significant nystagmus; visual fields are full; upper and lower facial muscles are normal in strength and symmetric, there is no flattening of the nasolabial folds; tongue is midline; uvula is midline;  shoulder elevation is normal.  MOTOR: Normal tone, bulk and strength; no pronator drift.  COORDINATION: Left finger to nose is normal, right finger to nose is normal, No rest tremor; no intention tremor; no postural tremor; no bradykinesia.  REFLEXES: Deep tendon reflexes are symmetrical and normal. Babinski reflexes are flexor bilaterally.   SENSATION: Normal to light touch.   Blood pressure 123/63, pulse 66, temperature 98.2 F (36.8 C), temperature source Oral, resp. rate 20, height $RemoveBe'6\' 3"'rWDOGIJKN$  (1.905 m), weight 108.4 kg (238 lb 15.7 oz), SpO2 96 %.  Past Medical History  Diagnosis Date  . BPH (benign prostatic hyperplasia)   . Kidney stones 2011  . Tooth infection 07/03/2011  . Colonic mass 07/03/2011  . Erosive gastritis 07/03/2011  . Renal calculus   . Cataract   . Cancer     colon  . Difficult intubation     extra long tube for intub per wife and slow to wake up   . GERD (gastroesophageal reflux disease)     Past Surgical History  Procedure Laterality Date  . Prostate biopsy    . Esophagogastroduodenoscopy  07/03/2011    Procedure: ESOPHAGOGASTRODUODENOSCOPY (EGD);  Surgeon: Rogene Houston, MD;  Location: [facility];  Service: Endoscopy;  Laterality: N/A;  . Colonoscopy  07/03/2011    Procedure: COLONOSCOPY;  Surgeon: Rogene Houston, MD;  Location: [facility];  Service: Endoscopy;  Laterality: N/A;  . Partial colectomy  07/04/2011    Procedure: PARTIAL COLECTOMY;  Surgeon: Jamesetta So, MD;  Location: [facility];  Service: General;  Laterality: N/A;  .  Cystoscopy  07/09/2011    Procedure: CYSTOSCOPY FLEXIBLE;  Surgeon: Marissa Nestle, MD;  Location: [facility];  Service: Urology;  Laterality: N/A;  . Colon surgery    . Transurethral resection of prostate  07/11/2011    Procedure: TRANSURETHRAL RESECTION OF THE PROSTATE (TURP);  Surgeon: Marissa Nestle, MD;  Location: [facility];  Service: Urology;  Laterality: N/A;  . Cystoscopy with litholapaxy  07/11/2011    Procedure:  CYSTOSCOPY WITH LITHOLAPAXY;  Surgeon: Marissa Nestle, MD;  Location: [facility];  Service: Urology;  Laterality: N/A;  . Cataract extraction w/phaco Left 08/23/2012    Procedure: CATARACT EXTRACTION PHACO AND INTRAOCULAR LENS PLACEMENT (IOC);  Surgeon: Tonny Branch, MD;  Location: [facility];  Service: Ophthalmology;  Laterality: Left;  CDE 16.64  . Colonoscopy N/A 09/16/2012    Procedure: COLONOSCOPY;  Surgeon: Rogene Houston, MD;  [facility]: [facility];  Service: Endoscopy;  Laterality: N/A;  930  . Cataract extraction w/phaco Right 06/09/2013    Procedure: CATARACT EXTRACTION PHACO AND INTRAOCULAR LENS PLACEMENT (IOC) CDE=10.86;  Surgeon: Tonny Branch, MD;  Location: [facility];  Service: Ophthalmology;  Laterality: Right;  . Prostage cancer    . Hernia repair  5726     umbilical   . Robot assisted laparoscopic radical prostatectomy Bilateral 01/19/2015    Procedure: ROBOTIC ASSISTED LAPAROSCOPIC RADICAL PROSTATECTOMY AND BILATERAL PELVIC LYMPH NODE DISSECTION, LAPARASCOPIC  LYSIS OF ADHESIONS;  Surgeon: Ardis Hughs, MD;  Location: [facility];  Service: Urology;  Laterality: Bilateral;    Family History  Problem Relation Age of Onset  . Alzheimer's disease Mother   . Alzheimer's disease Father   . Aneurysm Brother     brain  . Anuerysm Brother   . Colon cancer Neg Hx   . Healthy Son   . Healthy Son     Social History:  reports that he quit smoking about 25 years ago. His smoking use included Cigarettes. He has a 50 pack-year smoking history. He has never used smokeless tobacco. He reports that he does not drink alcohol or use illicit drugs.  Allergies: No Known Allergies  Medications: Prior to Admission medications   Medication Sig Start Date End Date Taking? Authorizing Provider  docusate sodium (COLACE) 100 MG capsule Take 1 capsule (100 mg total) by mouth 2 (two) times daily. 01/22/15  Yes Star Age, MD  HYDROcodone-acetaminophen (NORCO) 5-325 MG per tablet Take 1-2 tablets by mouth  every 6 (six) hours as needed. 01/19/15  Yes Amanda Dancy, PA-C  pantoprazole (PROTONIX) 40 MG tablet Take 1 tablet (40 mg total) by mouth daily. Patient taking differently: Take 40 mg by mouth every morning.  05/15/14  Yes Butch Penny, NP  Polyvinyl Alcohol-Povidone (REFRESH OP) Apply 1-2 drops to eye daily as needed (dust in eyes.).   Yes Historical Provider, MD  Probiotic CAPS Take 1 capsule by mouth every morning. Taking PB8   Yes Historical Provider, MD  senna (SENOKOT) 8.6 MG TABS tablet Take 2 tablets (17.2 mg total) by mouth daily. Patient taking differently: Take 2 tablets by mouth daily as needed for mild constipation.  01/22/15  Yes Star Age, MD  aspirin EC 81 MG EC tablet Take 1 tablet (81 mg total) by mouth daily. 01/26/15   Kathie Dike, MD  atorvastatin (LIPITOR) 10 MG tablet Take 1 tablet (10 mg total) by mouth daily at 6 PM. 01/27/15   Kathie Dike, MD  sulfamethoxazole-trimethoprim (BACTRIM DS,SEPTRA DS) 800-160 MG per tablet Take 1 tablet by  mouth 2 (two) times daily. Start the day prior to foley removal appointment Patient not taking: Reported on 01/25/2015 01/19/15   Debbrah Alar, PA-C    Scheduled Meds: . acidophilus  1 capsule Oral Daily  . aspirin EC  81 mg Oral Daily  . atorvastatin  10 mg Oral q1800  . docusate sodium  100 mg Oral BID  . enoxaparin (LOVENOX) injection  40 mg Subcutaneous Q24H  . hydroxypropyl methylcellulose / hypromellose  2 drop Both Eyes BID  . pantoprazole  40 mg Oral Daily  . senna  2 tablet Oral Daily   Continuous Infusions: . sodium chloride 50 mL/hr at 01/26/15 0024   PRN Meds:.HYDROcodone-acetaminophen, ondansetron **OR** ondansetron (ZOFRAN) IV     Results for orders placed or performed during the hospital encounter of 01/25/15 (from the past 48 hour(s))  Ethanol     Status: None   Collection Time: 01/25/15  5:49 PM  Result Value Ref Range   Alcohol, Ethyl (B) <5 <5 mg/dL    Comment:        LOWEST DETECTABLE LIMIT  FOR SERUM ALCOHOL IS 5 mg/dL FOR MEDICAL PURPOSES ONLY   Protime-INR     Status: None   Collection Time: 01/25/15  5:49 PM  Result Value Ref Range   Prothrombin Time 15.0 11.6 - 15.2 seconds   INR 1.16 0.00 - 1.49  APTT     Status: None   Collection Time: 01/25/15  5:49 PM  Result Value Ref Range   aPTT 28 24 - 37 seconds  CBC     Status: Abnormal   Collection Time: 01/25/15  5:49 PM  Result Value Ref Range   WBC 10.6 (H) 4.0 - 10.5 K/uL   RBC 4.61 4.22 - 5.81 MIL/uL   Hemoglobin 14.0 13.0 - 17.0 g/dL   HCT 41.2 39.0 - 52.0 %   MCV 89.4 78.0 - 100.0 fL   MCH 30.4 26.0 - 34.0 pg   MCHC 34.0 30.0 - 36.0 g/dL   RDW 13.1 11.5 - 15.5 %   Platelets 301 150 - 400 K/uL  Differential     Status: Abnormal   Collection Time: 01/25/15  5:49 PM  Result Value Ref Range   Neutrophils Relative % 71 43 - 77 %   Neutro Abs 7.5 1.7 - 7.7 K/uL   Lymphocytes Relative 15 12 - 46 %   Lymphs Abs 1.6 0.7 - 4.0 K/uL   Monocytes Relative 7 3 - 12 %   Monocytes Absolute 0.8 0.1 - 1.0 K/uL   Eosinophils Relative 7 (H) 0 - 5 %   Eosinophils Absolute 0.7 0.0 - 0.7 K/uL   Basophils Relative 0 0 - 1 %   Basophils Absolute 0.0 0.0 - 0.1 K/uL  Comprehensive metabolic panel     Status: Abnormal   Collection Time: 01/25/15  5:49 PM  Result Value Ref Range   Sodium 138 135 - 145 mmol/L   Potassium 3.8 3.5 - 5.1 mmol/L   Chloride 104 101 - 111 mmol/L   CO2 26 22 - 32 mmol/L   Glucose, Bld 112 (H) 65 - 99 mg/dL   BUN 18 6 - 20 mg/dL   Creatinine, Ser 0.97 0.61 - 1.24 mg/dL   Calcium 8.7 (L) 8.9 - 10.3 mg/dL   Total Protein 7.1 6.5 - 8.1 g/dL   Albumin 3.5 3.5 - 5.0 g/dL   AST 47 (H) 15 - 41 U/L   ALT 47 17 - 63 U/L   Alkaline Phosphatase  64 38 - 126 U/L   Total Bilirubin 0.8 0.3 - 1.2 mg/dL   GFR calc non Af Amer >60 >60 mL/min   GFR calc Af Amer >60 >60 mL/min    Comment: (NOTE) The eGFR has been calculated using the CKD EPI equation. This calculation has not been validated in all clinical  situations. eGFR's persistently <60 mL/min signify possible Chronic Kidney Disease.    Anion gap 8 5 - 15  TSH     Status: None   Collection Time: 01/25/15  5:49 PM  Result Value Ref Range   TSH 2.609 0.350 - 4.500 uIU/mL  I-stat troponin, ED (not at [facility], [facility])     Status: None   Collection Time: 01/25/15  6:05 PM  Result Value Ref Range   Troponin i, poc 0.00 0.00 - 0.08 ng/mL   Comment 3            Comment: Due to the release kinetics of cTnI, a negative result within the first hours of the onset of symptoms does not rule out myocardial infarction with certainty. If myocardial infarction is still suspected, repeat the test at appropriate intervals.   I-Stat Chem 8, ED  (not at [facility], [facility])     Status: Abnormal   Collection Time: 01/25/15  6:07 PM  Result Value Ref Range   Sodium 139 135 - 145 mmol/L   Potassium 3.8 3.5 - 5.1 mmol/L   Chloride 101 101 - 111 mmol/L   BUN 18 6 - 20 mg/dL   Creatinine, Ser 1.00 0.61 - 1.24 mg/dL   Glucose, Bld 109 (H) 65 - 99 mg/dL   Calcium, Ion 1.15 1.13 - 1.30 mmol/L   TCO2 23 0 - 100 mmol/L   Hemoglobin 14.3 13.0 - 17.0 g/dL   HCT 42.0 39.0 - 52.0 %  Urine rapid drug screen (hosp performed)not at [facility]     Status: None   Collection Time: 01/25/15  6:39 PM  Result Value Ref Range   Opiates NONE DETECTED NONE DETECTED   Cocaine NONE DETECTED NONE DETECTED   Benzodiazepines NONE DETECTED NONE DETECTED   Amphetamines NONE DETECTED NONE DETECTED   Tetrahydrocannabinol NONE DETECTED NONE DETECTED   Barbiturates NONE DETECTED NONE DETECTED    Comment:        DRUG SCREEN FOR MEDICAL PURPOSES ONLY.  IF CONFIRMATION IS NEEDED FOR ANY PURPOSE, NOTIFY LAB WITHIN 5 DAYS.        LOWEST DETECTABLE LIMITS FOR URINE DRUG SCREEN Drug Class       Cutoff (ng/mL) Amphetamine      1000 Barbiturate      200 Benzodiazepine   563 Tricyclics       149 Opiates          300 Cocaine          300 THC              50   Urinalysis, Routine w reflex  microscopic (not at [facility])     Status: Abnormal   Collection Time: 01/25/15  6:39 PM  Result Value Ref Range   Color, Urine YELLOW YELLOW   APPearance CLEAR CLEAR   Specific Gravity, Urine 1.025 1.005 - 1.030   pH 6.0 5.0 - 8.0   Glucose, UA NEGATIVE NEGATIVE mg/dL   Hgb urine dipstick LARGE (A) NEGATIVE   Bilirubin Urine NEGATIVE NEGATIVE   Ketones, ur NEGATIVE NEGATIVE mg/dL   Protein, ur 30 (A) NEGATIVE mg/dL   Urobilinogen, UA 0.2 0.0 - 1.0  mg/dL   Nitrite NEGATIVE NEGATIVE   Leukocytes, UA TRACE (A) NEGATIVE  Urine microscopic-add on     Status: None   Collection Time: 01/25/15  6:39 PM  Result Value Ref Range   Squamous Epithelial / LPF RARE RARE   WBC, UA 3-6 <3 WBC/hpf   RBC / HPF 21-50 <3 RBC/hpf  Lipid panel     Status: Abnormal   Collection Time: 01/26/15  6:00 AM  Result Value Ref Range   Cholesterol 167 0 - 200 mg/dL   Triglycerides 151 (H) <150 mg/dL   HDL 26 (L) >40 mg/dL   Total CHOL/HDL Ratio 6.4 RATIO   VLDL 30 0 - 40 mg/dL   LDL Cholesterol 111 (H) 0 - 99 mg/dL    Comment:        Total Cholesterol/HDL:CHD Risk Coronary Heart Disease Risk Table                     Men   Women  1/2 Average Risk   3.4   3.3  Average Risk       5.0   4.4  2 X Average Risk   9.6   7.1  3 X Average Risk  23.4   11.0        Use the calculated Patient Ratio above and the CHD Risk Table to determine the patient's CHD Risk.        ATP III CLASSIFICATION (LDL):  <100     mg/dL   Optimal  100-129  mg/dL   Near or Above                    Optimal  130-159  mg/dL   Borderline  160-189  mg/dL   High  >190     mg/dL   Very High   Comprehensive metabolic panel     Status: Abnormal   Collection Time: 01/26/15  6:23 AM  Result Value Ref Range   Sodium 138 135 - 145 mmol/L   Potassium 3.7 3.5 - 5.1 mmol/L   Chloride 104 101 - 111 mmol/L   CO2 28 22 - 32 mmol/L   Glucose, Bld 108 (H) 65 - 99 mg/dL   BUN 16 6 - 20 mg/dL   Creatinine, Ser 0.92 0.61 - 1.24 mg/dL   Calcium 8.4  (L) 8.9 - 10.3 mg/dL   Total Protein 6.0 (L) 6.5 - 8.1 g/dL   Albumin 2.9 (L) 3.5 - 5.0 g/dL   AST 35 15 - 41 U/L   ALT 38 17 - 63 U/L   Alkaline Phosphatase 53 38 - 126 U/L   Total Bilirubin 0.8 0.3 - 1.2 mg/dL   GFR calc non Af Amer >60 >60 mL/min   GFR calc Af Amer >60 >60 mL/min    Comment: (NOTE) The eGFR has been calculated using the CKD EPI equation. This calculation has not been validated in all clinical situations. eGFR's persistently <60 mL/min signify possible Chronic Kidney Disease.    Anion gap 6 5 - 15  CBC     Status: Abnormal   Collection Time: 01/26/15  6:23 AM  Result Value Ref Range   WBC 8.7 4.0 - 10.5 K/uL   RBC 4.16 (L) 4.22 - 5.81 MIL/uL   Hemoglobin 12.5 (L) 13.0 - 17.0 g/dL   HCT 37.5 (L) 39.0 - 52.0 %   MCV 90.1 78.0 - 100.0 fL   MCH 30.0 26.0 - 34.0 pg   MCHC 33.3 30.0 -  36.0 g/dL   RDW 13.1 11.5 - 15.5 %   Platelets 301 150 - 400 K/uL    Studies/Results:     Kearstyn Avitia A. Merlene Laughter, M.D.  Diplomate, Tax adviser of Psychiatry and Neurology ( Neurology). 01/26/2015, 9:28 PM

## 2015-01-26 NOTE — Discharge Summary (Signed)
Physician Discharge Summary  William Burns WYO:[id number] DOB: 1945/08/06 DOA: 01/25/2015  PCP: [name], MD  Admit date: 01/25/2015 Discharge date: 01/26/2015  Time spent: 35 minutes  Recommendations for Outpatient Follow-up:  1. Patient will follow up with neurology in 4 weeks 2. Outpatient EEG has been ordered 3. Follow up with primary care physician in 2 weeks  Discharge Diagnoses:  Active Problems:   Prostate cancer   Transient global amnesia   GERD (gastroesophageal reflux disease)   TIA (transient ischemic attack) Hyperlipidemia  Discharge Condition: improved  Diet recommendation: heart healthy  Filed Weights   01/25/15 1725 01/25/15 2153  Weight: 113.399 kg (250 lb) 108.4 kg (238 lb 15.7 oz)    History of present illness:  This patient presented to the hospital with transient altered mental status that began at home. Patient was noted to be very confused. He did not have any recollection of the events. Patient was brought to the ER for evaluation  Hospital Course:  Patient had transient altered mental status which resolved spontaneously. He has not had any recurrence of symptoms. He did not have any focal deficits at this time. MRI of the brain was negative for acute infarct. Carotid Dopplers and echocardiogram were unremarkable. Patient was seen by neurology who felt that patient may have had a transient ischemic attack versus seizure. Since this was the patient's first event, he would not recommend starting a seizure medicine. He did recommend starting a daily aspirin and statin for elevated LDL. EEG was recommended as an outpatient. This has been ordered. He'll follow up with neurology in 4 weeks. Patient is otherwise stable for discharge at this time.  Procedures:    Consultations:  Neurology  Discharge Exam: Filed Vitals:   01/26/15 1452  BP: 123/63  Pulse: 66  Temp: 98.2 F (36.8 C)  Resp: 20    General: NAD Cardiovascular: S1, S2  RRR Respiratory: CTA B  Discharge Instructions   Discharge Instructions    Diet - low sodium heart healthy    Complete by:  As directed      Increase activity slowly    Complete by:  As directed           Current Discharge Medication List    START taking these medications   Details  aspirin EC 81 MG EC tablet Take 1 tablet (81 mg total) by mouth daily. Qty: 30 tablet, Refills: 1    atorvastatin (LIPITOR) 10 MG tablet Take 1 tablet (10 mg total) by mouth daily at 6 PM. Qty: 30 tablet, Refills: 1      CONTINUE these medications which have NOT CHANGED   Details  docusate sodium (COLACE) 100 MG capsule Take 1 capsule (100 mg total) by mouth 2 (two) times daily. Qty: 30 capsule, Refills: 0    HYDROcodone-acetaminophen (NORCO) 5-325 MG per tablet Take 1-2 tablets by mouth every 6 (six) hours as needed. Qty: 30 tablet, Refills: 0    pantoprazole (PROTONIX) 40 MG tablet Take 1 tablet (40 mg total) by mouth daily. Qty: 90 tablet, Refills: 3   Associated Diagnoses: Gastroesophageal reflux disease without esophagitis    Polyvinyl Alcohol-Povidone (REFRESH OP) Apply 1-2 drops to eye daily as needed (dust in eyes.).    Probiotic CAPS Take 1 capsule by mouth every morning. Taking PB8    senna (SENOKOT) 8.6 MG TABS tablet Take 2 tablets (17.2 mg total) by mouth daily. Qty: 120 each, Refills: 0    sulfamethoxazole-trimethoprim (BACTRIM DS,SEPTRA DS) 800-160 MG per tablet  Take 1 tablet by mouth 2 (two) times daily. Start the day prior to foley removal appointment Qty: 6 tablet, Refills: 0       No Known Allergies    The results of significant diagnostics from this hospitalization (including imaging, microbiology, ancillary and laboratory) are listed below for reference.    Significant Diagnostic Studies: Ct Head Wo Contrast  01/25/2015   CLINICAL DATA:  Prostate surgery on Friday. Confusion which began today.  EXAM: CT HEAD WITHOUT CONTRAST  TECHNIQUE: Contiguous axial images  were obtained from the base of the skull through the vertex without intravenous contrast.  COMPARISON:  11/22/2012  FINDINGS: There is a focal area of low attenuation within the left frontal lobe periventricular white matter which is new from previous exam and is consistent with subacute or chronic subcortical infarct. Mild prominence of the sulci and ventricles identified consistent with brain atrophy. There is no evidence for acute intracranial hemorrhage, mass or acute cortical infarct. There is partial opacification of the ethmoid air cells. The mastoid air cells are clear. The calvarium appears intact.  IMPRESSION: 1. No acute intracranial abnormalities. 2. Subacute to chronic left frontal lobe subcortical infarct is new from 2014.   Electronically Signed   By: Kerby Moors M.D.   On: 01/25/2015 17:57   Mr Brain Wo Contrast  01/26/2015   CLINICAL DATA:  69 year old male with confusion following prostate surgery last week. Initial encounter.  EXAM: MRI HEAD WITHOUT CONTRAST  TECHNIQUE: Multiplanar, multiecho pulse sequences of the brain and surrounding structures were obtained without intravenous contrast.  COMPARISON:  Head CT without contrast 01/25/2015 and earlier.  FINDINGS: Cerebral volume is within normal limits for age. No restricted diffusion to suggest acute infarction. No midline shift, mass effect, evidence of mass lesion, ventriculomegaly, extra-axial collection or acute intracranial hemorrhage. Cervicomedullary junction and pituitary are within normal limits. Major intracranial vascular flow voids are preserved, there is a mild degree of intracranial artery dolichoectasia.  Minimal to mild for age mostly periventricular cerebral white matter T2 and FLAIR hyperintensity, nonspecific. No cortical encephalomalacia or chronic cerebral blood products identified. Deep gray matter nuclei, brainstem, and cerebellum are within normal limits for age.  Visible internal auditory structures appear normal.  Mastoids are clear. Mild paranasal sinus mucosal thickening. Postoperative changes to the globes. Negative orbit and scalp soft tissues. Normal bone marrow signal. Negative visualized cervical spine.  IMPRESSION: No acute intracranial abnormality, and largely unremarkable for age noncontrast MRI appearance of the brain.   Electronically Signed   By: Genevie Ann M.D.   On: 01/26/2015 08:55   US Carotid Bilateral  01/26/2015   CLINICAL DATA:  Transient ischemic attack.  EXAM: BILATERAL CAROTID DUPLEX ULTRASOUND  TECHNIQUE: Pearline Cables scale imaging, color Doppler and duplex ultrasound were performed of bilateral carotid and vertebral arteries in the neck.  COMPARISON:  None.  FINDINGS: Criteria: Quantification of carotid stenosis is based on velocity parameters that correlate the residual internal carotid diameter with NASCET-based stenosis levels, using the diameter of the distal internal carotid lumen as the denominator for stenosis measurement.  The following velocity measurements were obtained:  RIGHT  ICA:  80/20 cm/sec  CCA:  629/47 cm/sec  SYSTOLIC ICA/CCA RATIO:  0.6  DIASTOLIC ICA/CCA RATIO:  1.3  ECA:  106 cm/sec  LEFT  ICA:  72/17 cm/sec  CCA:  654/65 cm/sec  SYSTOLIC ICA/CCA RATIO:  0.6  DIASTOLIC ICA/CCA RATIO:  1.0  ECA:  90 cm/sec  RIGHT CAROTID ARTERY: Minimal plaque formation is noted in  the right common and internal carotid arteries consistent with less than 50% diameter stenosis based on ultrasound and Doppler criteria.  RIGHT VERTEBRAL ARTERY:  Antegrade flow is noted.  LEFT CAROTID ARTERY: Mild eccentric plaque formation is noted in the left carotid bulb and proximal left internal carotid artery consistent with less than 50% diameter stenosis based on ultrasound and Doppler criteria.  LEFT VERTEBRAL ARTERY:  Antegrade flow is noted.  IMPRESSION: No hemodynamically significant stenosis or plaque is noted in either cervical carotid artery.   Electronically Signed   By: Marijo Conception, M.D.   On: 01/26/2015  15:05    Microbiology: No results found for this or any previous visit (from the past 240 hour(s)).   Labs: Basic Metabolic Panel:  Recent Labs Lab 01/19/15 2035 01/20/15 0529 01/25/15 1749 01/25/15 1807 01/26/15 0623  NA  --  135 138 139 138  K  --  4.2 3.8 3.8 3.7  CL  --  103 104 101 104  CO2  --  27 26  --  28  GLUCOSE  --  159* 112* 109* 108*  BUN  --  18 18 18 16   CREATININE 1.25* 1.05 0.97 1.00 0.92  CALCIUM  --  8.2* 8.7*  --  8.4*   Liver Function Tests:  Recent Labs Lab 01/25/15 1749 01/26/15 0623  AST 47* 35  ALT 47 38  ALKPHOS 64 53  BILITOT 0.8 0.8  PROT 7.1 6.0*  ALBUMIN 3.5 2.9*   No results for input(s): LIPASE, AMYLASE in the last 168 hours. No results for input(s): AMMONIA in the last 168 hours. CBC:  Recent Labs Lab 01/19/15 2035 01/20/15 0529 01/22/15 0539 01/25/15 1749 01/25/15 1807 01/26/15 0623  WBC 14.8*  --  8.3 10.6*  --  8.7  NEUTROABS  --   --   --  7.5  --   --   HGB 13.9 13.0 12.2* 14.0 14.3 12.5*  HCT 41.4 39.7 36.9* 41.2 42.0 37.5*  MCV 88.7  --  91.1 89.4  --  90.1  PLT 246  --  240 301  --  301   Cardiac Enzymes: No results for input(s): CKTOTAL, CKMB, CKMBINDEX, TROPONINI in the last 168 hours. BNP: BNP (last 3 results) No results for input(s): BNP in the last 8760 hours.  ProBNP (last 3 results) No results for input(s): PROBNP in the last 8760 hours.  CBG: No results for input(s): GLUCAP in the last 168 hours.     Signed:  Harryette Shuart  [facility] 01/26/2015, 7:03 PM

## 2015-01-26 NOTE — Progress Notes (Signed)
NURSING PROGRESS NOTE  William Burns [id number] Discharge Data: 01/26/2015 7:54 PM Attending Provider: Kathie Dike, MD PJS:[facility], [facility], MD   [name] to be D/C'd Home per MD order.    All IV's discontinued and monitored for bleeding.  All belongings returned to patient for patient to take home.  AVS summary reviewed with patient and spouse.  Patient left floor via wheelchair, escorted by NT.  Last Documented Vital Signs:  Blood pressure 123/63, pulse 66, temperature 98.2 F (36.8 C), temperature source Oral, resp. rate 20, height 6\' 3"  (1.905 m), weight 108.4 kg (238 lb 15.7 oz), SpO2 96 %.  [name] D

## 2015-01-26 NOTE — Care Management Note (Signed)
Case Management Note  Patient Details  Name: William Burns MRN: [id number] Date of Birth: 17-Dec-1945  Subjective/Objective:                  Pt admitted from home with TIA. Pt lives with his wife and will return home at discharge. Pt is independent with ADL's.  Action/Plan: No CM needs noted.  Expected Discharge Date:  01/27/15               Expected Discharge Plan:  Home/Self Care  In-House Referral:  NA  Discharge planning Services  CM Consult  Post Acute Care Choice:  NA Choice offered to:  NA  DME Arranged:    DME Agency:     HH Arranged:    HH Agency:     Status of Service:  Completed, signed off  Medicare Important Message Given:    Date Medicare IM Given:    Medicare IM give by:    Date Additional Medicare IM Given:    Additional Medicare Important Message give by:     If discussed at [place] of Stay Meetings, dates discussed:    Additional Comments:  Joylene Draft, RN 01/26/2015, 10:50 AM

## 2015-01-27 LAB — HEMOGLOBIN A1C
Hgb A1c MFr Bld: 5.5 % (ref 4.8–5.6)
MEAN PLASMA GLUCOSE: 111 mg/dL

## 2015-01-27 NOTE — Progress Notes (Signed)
Followed up with patient to clarify instructions for OP EEG. Explained that he should be contacted at the beginning of next week to set up appointment, but if he does not hear from them, he should follow up with Dr. Freddie Apley office. Contact information given with discharge summary.

## 2015-02-06 ENCOUNTER — Encounter (HOSPITAL_COMMUNITY): Payer: Medicare Other | Attending: Hematology & Oncology

## 2015-02-06 DIAGNOSIS — Z85038 Personal history of other malignant neoplasm of large intestine: Secondary | ICD-10-CM

## 2015-02-06 DIAGNOSIS — C189 Malignant neoplasm of colon, unspecified: Secondary | ICD-10-CM | POA: Diagnosis not present

## 2015-02-06 LAB — CBC WITH DIFFERENTIAL/PLATELET
Basophils Absolute: 0.1 10*3/uL (ref 0.0–0.1)
Basophils Relative: 2 % — ABNORMAL HIGH (ref 0–1)
EOS ABS: 0.7 10*3/uL (ref 0.0–0.7)
EOS PCT: 8 % — AB (ref 0–5)
HCT: 38.2 % — ABNORMAL LOW (ref 39.0–52.0)
Hemoglobin: 12.9 g/dL — ABNORMAL LOW (ref 13.0–17.0)
LYMPHS ABS: 2.2 10*3/uL (ref 0.7–4.0)
Lymphocytes Relative: 28 % (ref 12–46)
MCH: 30.3 pg (ref 26.0–34.0)
MCHC: 33.8 g/dL (ref 30.0–36.0)
MCV: 89.7 fL (ref 78.0–100.0)
MONO ABS: 0.5 10*3/uL (ref 0.1–1.0)
MONOS PCT: 6 % (ref 3–12)
Neutro Abs: 4.6 10*3/uL (ref 1.7–7.7)
Neutrophils Relative %: 56 % (ref 43–77)
PLATELETS: 357 10*3/uL (ref 150–400)
RBC: 4.26 MIL/uL (ref 4.22–5.81)
RDW: 13 % (ref 11.5–15.5)
WBC: 8.1 10*3/uL (ref 4.0–10.5)

## 2015-02-06 LAB — COMPREHENSIVE METABOLIC PANEL
ALT: 21 U/L (ref 17–63)
ANION GAP: 7 (ref 5–15)
AST: 27 U/L (ref 15–41)
Albumin: 3.6 g/dL (ref 3.5–5.0)
Alkaline Phosphatase: 63 U/L (ref 38–126)
BUN: 12 mg/dL (ref 6–20)
CALCIUM: 9 mg/dL (ref 8.9–10.3)
CHLORIDE: 104 mmol/L (ref 101–111)
CO2: 30 mmol/L (ref 22–32)
Creatinine, Ser: 0.81 mg/dL (ref 0.61–1.24)
Glucose, Bld: 89 mg/dL (ref 65–99)
Potassium: 4 mmol/L (ref 3.5–5.1)
SODIUM: 141 mmol/L (ref 135–145)
Total Bilirubin: 0.6 mg/dL (ref 0.3–1.2)
Total Protein: 6.9 g/dL (ref 6.5–8.1)

## 2015-02-06 NOTE — Progress Notes (Signed)
Labs drawn

## 2015-02-07 LAB — CEA: CEA: 2.5 ng/mL (ref 0.0–4.7)

## 2015-02-08 ENCOUNTER — Encounter (HOSPITAL_COMMUNITY): Payer: Self-pay | Admitting: Hematology & Oncology

## 2015-02-08 ENCOUNTER — Ambulatory Visit (HOSPITAL_COMMUNITY): Payer: Medicare Other | Admitting: Hematology & Oncology

## 2015-02-08 ENCOUNTER — Encounter (HOSPITAL_BASED_OUTPATIENT_CLINIC_OR_DEPARTMENT_OTHER): Payer: Medicare Other | Admitting: Hematology & Oncology

## 2015-02-08 VITALS — BP 131/64 | HR 66 | Temp 98.6°F | Resp 18 | Wt 240.0 lb

## 2015-02-08 DIAGNOSIS — G4730 Sleep apnea, unspecified: Secondary | ICD-10-CM | POA: Diagnosis not present

## 2015-02-08 DIAGNOSIS — C189 Malignant neoplasm of colon, unspecified: Secondary | ICD-10-CM | POA: Diagnosis not present

## 2015-02-08 DIAGNOSIS — C61 Malignant neoplasm of prostate: Secondary | ICD-10-CM | POA: Diagnosis not present

## 2015-02-08 NOTE — Progress Notes (Signed)
William Cahill, MD  [place] [address]   DIAGNOSIS:  Stage II-A carcinoma of the cecum R hemicolectomy on 07/07/2011, no additional therapy Colonoscopy on 07/2012 with sessile adenoma Adenocarcinoma of prostate s/p protatectomy on 01/19/2015, Gleason score 4 + 3 = 7, pT2c, pN0 stage   CURRENT THERAPY: Observation  INTERVAL HISTORY: William Burns 69 y.o. male returns for follow-up of a stage IIa carcinoma of the cecum. He underwent a right hemicolectomy in February 2013. He has had problems with kidney stones and follows with a urologist. He has just undergone a prostatectomy on August 19. He is making a nice recovery. He states he has therapy coming up in regards to urination.  The patient had a Robotic Assisted Prostactecomy completed on 01/19/15 for prostate cancer.   He experiences no pain and has taken no medication but does experience discomfort.  He is unsure about the regularity of his urinary continence.  He sometimes has to strain.  His wife notes that he is concerned about intimacy and his future ability to have an erection.   He complains of a "buldge" coming from his incision site.  He has been active doing things in the field with his family.  He has been cleared to drive.    He was [facility] to [facility] on August 25 with a questionable TIA. He has follow-up with neurology. He has had no further symptoms.  His wife says that he has trouble sleeping at night and is very tired during the day. She notes periods where he "stops" breathing at night and arouses.    He denies chest pain.  He has loose, irregular bowels.  This is consistent with before.  He is eating better.   MEDICAL HISTORY: Past Medical History  Diagnosis Date  . BPH (benign prostatic hyperplasia)   . Kidney stones 2011  . Tooth infection 07/03/2011  . Colonic mass 07/03/2011  . Erosive gastritis 07/03/2011  . Renal calculus   . Cataract   . Cancer     colon  . Difficult intubation    extra long tube for intub per wife and slow to wake up   . GERD (gastroesophageal reflux disease)     has Microcytic hypochromic anemia; Hx of colonic polyp; Hyperglycemia; BPH (benign prostatic hyperplasia); Tooth infection; Adenocarcinoma of colon; Erosive gastritis; Calculus, bladder; Short-segment Barrett's esophagus; Diarrhea; Kidney stone on left side; Prostate cancer; Transient global amnesia; GERD (gastroesophageal reflux disease); and TIA (transient ischemic attack) on his problem list.     has No Known Allergies.  William Burns does not currently have medications on file.  SURGICAL HISTORY: Past Surgical History  Procedure Laterality Date  . Prostate biopsy    . Esophagogastroduodenoscopy  07/03/2011    Procedure: ESOPHAGOGASTRODUODENOSCOPY (EGD);  Surgeon: Rogene Houston, MD;  [facility];  Service: Endoscopy;  Laterality: N/A;  . Colonoscopy  07/03/2011    Procedure: COLONOSCOPY;  Surgeon: Rogene Houston, MD;  [facility];  Service: Endoscopy;  Laterality: N/A;  . Partial colectomy  07/04/2011    Procedure: PARTIAL COLECTOMY;  Surgeon: Jamesetta So, MD;  Location: [facility];  Service: General;  Laterality: N/A;  . Cystoscopy  07/09/2011    Procedure: Erlene Quan;  Surgeon: Marissa Nestle, MD;  Location: [facility];  Service: Urology;  Laterality: N/A;  . Colon surgery    . Transurethral resection of prostate  07/11/2011    Procedure: TRANSURETHRAL RESECTION OF  THE PROSTATE (TURP);  Surgeon: Marissa Nestle, MD;  Location: [facility];  Service: Urology;  Laterality: N/A;  . Cystoscopy with litholapaxy  07/11/2011    Procedure: CYSTOSCOPY WITH LITHOLAPAXY;  Surgeon: Marissa Nestle, MD;  Location: [facility];  Service: Urology;  Laterality: N/A;  . Cataract extraction w/phaco Left 08/23/2012    Procedure: CATARACT EXTRACTION PHACO AND INTRAOCULAR LENS PLACEMENT (IOC);  Surgeon: Tonny Branch, MD;  Location: [facility];  Service: Ophthalmology;  Laterality: Left;  CDE  16.64  . Colonoscopy N/A 09/16/2012    Procedure: COLONOSCOPY;  Surgeon: Rogene Houston, MD;  [facility];  Service: Endoscopy;  Laterality: N/A;  930  . Cataract extraction w/phaco Right 06/09/2013    Procedure: CATARACT EXTRACTION PHACO AND INTRAOCULAR LENS PLACEMENT (IOC) CDE=10.86;  Surgeon: Tonny Branch, MD;  Location: [facility];  Service: Ophthalmology;  Laterality: Right;  . Prostage cancer    . Hernia repair  3329     umbilical   . Robot assisted laparoscopic radical prostatectomy Bilateral 01/19/2015    Procedure: ROBOTIC ASSISTED LAPAROSCOPIC RADICAL PROSTATECTOMY AND BILATERAL PELVIC LYMPH NODE DISSECTION, LAPARASCOPIC  LYSIS OF ADHESIONS;  Surgeon: Ardis Hughs, MD;  Location: [facility];  Service: Urology;  Laterality: Bilateral;    SOCIAL HISTORY: Social History   Social History  . Marital Status: Married    Spouse Name: N/A  . Number of Children: N/A  . Years of Education: N/A   Occupational History  . farmer     tobacco; cattle; Sales executive   Social History Main Topics  . Smoking status: Former Smoker -- 2.00 packs/day for 25 years    Types: Cigarettes    Quit date: 06/02/1989  . Smokeless tobacco: Never Used  . Alcohol Use: No  . Drug Use: No  . Sexual Activity: Not Currently   Other Topics Concern  . Not on file   Social History Narrative    FAMILY HISTORY: Family History  Problem Relation Age of Onset  . Alzheimer's disease Mother   . Alzheimer's disease Father   . Aneurysm Brother     brain  . Anuerysm Brother   . Colon cancer Neg Hx   . Healthy Son   . Healthy Son     Review of Systems  Constitutional: Negative for fever, chills, weight loss and malaise/fatigue.  HENT: Negative for congestion, hearing loss, nosebleeds, sore throat and tinnitus.   Eyes: Negative for blurred vision, double vision, pain and discharge.  Respiratory: Negative for cough, hemoptysis, sputum production, shortness of breath and wheezing.   Cardiovascular:  Negative for chest pain, palpitations, claudication, leg swelling and PND.  Gastrointestinal: Negative for heartburn, nausea, vomiting, abdominal pain, diarrhea, constipation, blood in stool and melena.       See HPI for abdominal discomfort  Genitourinary: Negative for dysuria, urgency, frequency and hematuria.  Musculoskeletal: Negative for myalgias, joint pain and falls.  Skin: Negative for itching and rash.  Neurological: Negative for dizziness, tingling, tremors, sensory change, speech change, focal weakness, seizures, loss of consciousness, weakness and headaches.  Endo/Heme/Allergies: Does not bruise/bleed easily.  Psychiatric/Behavioral: Negative for depression, suicidal ideas, memory loss and substance abuse. The patient is not nervous/anxious and does not have insomnia.   14 point review of systems was performed and is negative except as detailed under history of present illness and above   PHYSICAL EXAMINATION  ECOG PERFORMANCE STATUS: 0 - Asymptomatic  Filed Vitals:   02/08/15 1101  BP: 131/64  Pulse: 66  Temp: 98.6  F (37 C)  Resp: 18    Physical Exam  Constitutional: He is oriented to person, place, and time and well-developed, well-nourished, and in no distress.  HENT:  Head: Normocephalic and atraumatic.  Nose: Nose normal.  Mouth/Throat: Oropharynx is clear and moist. No oropharyngeal exudate.  Eyes: Conjunctivae and EOM are normal. Pupils are equal, round, and reactive to light. Right eye exhibits no discharge. Left eye exhibits no discharge. No scleral icterus.  Neck: Normal range of motion. Neck supple. No tracheal deviation present. No thyromegaly present.  Cardiovascular: Normal rate, regular rhythm and normal heart sounds.  Exam reveals no gallop and no friction rub.   No murmur heard. Pulmonary/Chest: Effort normal and breath sounds normal. He has no wheezes. He has no rales.  Abdominal: Soft. Bowel sounds are normal. He exhibits no distension and no mass.  There is no tenderness. There is no rebound and no guarding. Multiple small well healing surgical incision sites.  Post surgical hematoma, mild Musculoskeletal: Normal range of motion. He exhibits no edema.  Lymphadenopathy:    He has no cervical adenopathy.  Neurological: He is alert and oriented to person, place, and time. He has normal reflexes. No cranial nerve deficit. Gait normal. Coordination normal.  Skin: Skin is warm and dry. No rash noted.  Psychiatric: Mood, memory, affect and judgment normal.  Nursing note and vitals reviewed.   LABORATORY DATA:  CBC    Component Value Date/Time   WBC 8.1 02/06/2015 0923   RBC 4.26 02/06/2015 0923   RBC 3.26* 07/01/2011 2229   HGB 12.9* 02/06/2015 0923   HCT 38.2* 02/06/2015 0923   PLT 357 02/06/2015 0923   MCV 89.7 02/06/2015 0923   MCH 30.3 02/06/2015 0923   MCHC 33.8 02/06/2015 0923   RDW 13.0 02/06/2015 0923   LYMPHSABS 2.2 02/06/2015 0923   MONOABS 0.5 02/06/2015 0923   EOSABS 0.7 02/06/2015 0923   BASOSABS 0.1 02/06/2015 0923   CMP     Component Value Date/Time   NA 141 02/06/2015 0923   K 4.0 02/06/2015 0923   CL 104 02/06/2015 0923   CO2 30 02/06/2015 0923   GLUCOSE 89 02/06/2015 0923   BUN 12 02/06/2015 0923   CREATININE 0.81 02/06/2015 0923   CALCIUM 9.0 02/06/2015 0923   PROT 6.9 02/06/2015 0923   ALBUMIN 3.6 02/06/2015 0923   AST 27 02/06/2015 0923   ALT 21 02/06/2015 0923   ALKPHOS 63 02/06/2015 0923   BILITOT 0.6 02/06/2015 0923   GFRNONAA >60 02/06/2015 0923   GFRAA >60 02/06/2015 0923   CEA  Status: Finalresult Visible to patient:  Not Released Nextappt: 05/06/2015 at 08:00 PM in Sleep Medicine (ASD-SLEEP Room 9) Dx:  Adenocarcinoma of colon           Ref Range 2d ago  73mo ago  17mo ago  50yr ago     CEA 0.0 - 4.7 ng/mL 2.5 3.1CM 3.2CM 1.7R, CM   Comments: (NOTE)     Roche ECLIA methodology    Nonsmokers <3.9                    Smokers   <5.6    Performed At: [facility]  [place], [place] [id number]  Lindon Romp MD LY:[id number]     Resulting Agency  [id number]      Specimen Collected: 02/06/15 9:23 AM Last Resulted: 02/07/15 6:38 AM  CM=Additional commentsR=Reference range differs from displayed range          ASSESSMENT and THERAPY PLAN:   Stage IIA Colorectal Cancer  69 year old male with a history of stage II colon cancer. Laboratory studies were reviewed with the patient and were excellent. He has a mild anemia which I suspect is post operative and related to his recent prostatectomy. CEA was normal. In regards to his colon cancer we will continue with observation with follow-up in 6 months.  Prostate Cancer  He has just undergone a prostatectomy and is doing well. He will continue to follow with urology.  Apnea/Fatigue  I have referred him for a sleep study which we will schedule in December per the patient's request.   All questions were answered. The patient knows to call the clinic with any problems, questions or concerns. We can certainly see the patient much sooner if necessary.   This document serves as a record of services personally performed by Ancil Linsey, MD. It was created on her behalf by Janace Hoard, a trained medical scribe. The creation of this record is based on the scribe's personal observations and the provider's statements to them. This document has been checked and approved by the attending provider.  I have reviewed the above documentation for accuracy and completeness, and I agree with the above.  This note was electronically signed.  Kelby Fam. Whitney Muse, MD

## 2015-02-08 NOTE — Patient Instructions (Signed)
..  [place] at [facility] Discharge Instructions  RECOMMENDATIONS MADE BY THE CONSULTANT AND ANY TEST RESULTS WILL BE SENT TO YOUR REFERRING PHYSICIAN.  Labs in 6 months  We will refer you for sleep study in December    Thank you for choosing [place] at [facility] to provide your oncology and hematology care.  To afford each patient quality time with our provider, please arrive at least 15 minutes before your scheduled appointment time.    You need to re-schedule your appointment should you arrive 10 or more minutes late.  We strive to give you quality time with our providers, and arriving late affects you and other patients whose appointments are after yours.  Also, if you no show three or more times for appointments you may be dismissed from the clinic at the providers discretion.     Again, thank you for choosing [facility].  Our hope is that these requests will decrease the amount of time that you wait before being seen by our physicians.       _____________________________________________________________  Should you have questions after your visit to [facility], please contact our office at [phone] between the hours of 8:30 a.m. and 4:30 p.m.  Voicemails left after 4:30 p.m. will not be returned until the following business day.  For prescription refill requests, have your pharmacy contact our office.

## 2015-02-09 ENCOUNTER — Other Ambulatory Visit (HOSPITAL_COMMUNITY): Payer: Self-pay | Admitting: Respiratory Therapy

## 2015-02-15 DIAGNOSIS — M6281 Muscle weakness (generalized): Secondary | ICD-10-CM | POA: Diagnosis not present

## 2015-02-15 DIAGNOSIS — R278 Other lack of coordination: Secondary | ICD-10-CM | POA: Diagnosis not present

## 2015-02-15 DIAGNOSIS — N393 Stress incontinence (female) (male): Secondary | ICD-10-CM | POA: Diagnosis not present

## 2015-02-23 ENCOUNTER — Encounter (INDEPENDENT_AMBULATORY_CARE_PROVIDER_SITE_OTHER): Payer: Self-pay | Admitting: *Deleted

## 2015-02-28 DIAGNOSIS — R278 Other lack of coordination: Secondary | ICD-10-CM | POA: Diagnosis not present

## 2015-02-28 DIAGNOSIS — N393 Stress incontinence (female) (male): Secondary | ICD-10-CM | POA: Diagnosis not present

## 2015-02-28 DIAGNOSIS — M6281 Muscle weakness (generalized): Secondary | ICD-10-CM | POA: Diagnosis not present

## 2015-02-28 DIAGNOSIS — C61 Malignant neoplasm of prostate: Secondary | ICD-10-CM | POA: Diagnosis not present

## 2015-03-12 DIAGNOSIS — C61 Malignant neoplasm of prostate: Secondary | ICD-10-CM | POA: Diagnosis not present

## 2015-03-12 DIAGNOSIS — R278 Other lack of coordination: Secondary | ICD-10-CM | POA: Diagnosis not present

## 2015-03-12 DIAGNOSIS — N393 Stress incontinence (female) (male): Secondary | ICD-10-CM | POA: Diagnosis not present

## 2015-03-12 DIAGNOSIS — M6281 Muscle weakness (generalized): Secondary | ICD-10-CM | POA: Diagnosis not present

## 2015-04-02 DIAGNOSIS — R278 Other lack of coordination: Secondary | ICD-10-CM | POA: Diagnosis not present

## 2015-04-02 DIAGNOSIS — N393 Stress incontinence (female) (male): Secondary | ICD-10-CM | POA: Diagnosis not present

## 2015-04-02 DIAGNOSIS — M6281 Muscle weakness (generalized): Secondary | ICD-10-CM | POA: Diagnosis not present

## 2015-05-08 DIAGNOSIS — M79674 Pain in right toe(s): Secondary | ICD-10-CM | POA: Diagnosis not present

## 2015-05-08 DIAGNOSIS — M2041 Other hammer toe(s) (acquired), right foot: Secondary | ICD-10-CM | POA: Diagnosis not present

## 2015-05-21 ENCOUNTER — Other Ambulatory Visit (INDEPENDENT_AMBULATORY_CARE_PROVIDER_SITE_OTHER): Payer: Self-pay | Admitting: Internal Medicine

## 2015-05-21 DIAGNOSIS — J00 Acute nasopharyngitis [common cold]: Secondary | ICD-10-CM | POA: Diagnosis not present

## 2015-05-21 DIAGNOSIS — M545 Low back pain: Secondary | ICD-10-CM | POA: Diagnosis not present

## 2015-05-31 ENCOUNTER — Ambulatory Visit (INDEPENDENT_AMBULATORY_CARE_PROVIDER_SITE_OTHER): Payer: Medicare Other | Admitting: Internal Medicine

## 2015-06-05 ENCOUNTER — Ambulatory Visit (INDEPENDENT_AMBULATORY_CARE_PROVIDER_SITE_OTHER): Payer: Medicare Other | Admitting: Internal Medicine

## 2015-06-05 ENCOUNTER — Encounter (INDEPENDENT_AMBULATORY_CARE_PROVIDER_SITE_OTHER): Payer: Self-pay | Admitting: Internal Medicine

## 2015-06-05 VITALS — BP 120/70 | HR 64 | Temp 98.0°F | Ht 74.0 in | Wt 243.4 lb

## 2015-06-05 DIAGNOSIS — Z85038 Personal history of other malignant neoplasm of large intestine: Secondary | ICD-10-CM | POA: Diagnosis not present

## 2015-06-05 NOTE — Progress Notes (Signed)
Subjective:    Patient ID: [name], male    DOB: 12/29/1945, 70 y.o.   MRN: [id number]  HPIHPI Here today for f/u. Hx of colon cancer and underwent a rt hemicolectomy in 2013 by Dr. Arnoldo Morale. Last seen in June of 2016.   Due for surveillance colonoscopy in April of 2017. 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 Hx of prostate Cancer and will underwent robotic laparoscopic prostatectomy in August 2019. No radiation  by Dr. Louis Meckel in [facility]). He tells me he is doing okay.  He does have urine leakage from his surgery which he says is aggravating. His appetite is good. He has lost about 9 pounds since his last visit. He usually has a BM daily. No melena or BRRB. No abdominal pain.       09/16/2012 Colonoscopy: Colon Withdrawal Time: 12 minutes  Impression:  Examination performed to ileocolonic anastomosis located in the region of hepatic flexure.  Few small diverticula at sigmoid colon.  5 mm polyp cold snared from rectosigmoid.  Biopsy: serrated adenoma.  Next colonoscopy 3 yrs. (2017).   Review of Systems Past Medical History  Diagnosis Date  . BPH (benign prostatic hyperplasia)   . Kidney stones 2011  . Tooth infection 07/03/2011  . Colonic mass 07/03/2011  . Erosive gastritis 07/03/2011  . Renal calculus   . Cataract   . Cancer     colon  . Difficult intubation     extra long tube for intub per wife and slow to wake up   . GERD (gastroesophageal reflux disease)     Past Surgical History  Procedure Laterality Date  . Prostate biopsy    . Esophagogastroduodenoscopy  07/03/2011    Procedure: ESOPHAGOGASTRODUODENOSCOPY (EGD);  Surgeon: Rogene Houston, MD;  [facility];  Service: Endoscopy;  Laterality: N/A;  . Colonoscopy  07/03/2011    Procedure: COLONOSCOPY;  Surgeon: Rogene Houston, MD;  [facility];  Service: Endoscopy;  Laterality: N/A;  . Partial colectomy  07/04/2011    Procedure: PARTIAL COLECTOMY;  Surgeon: Jamesetta So, MD;  Location: [facility];  Service: General;  Laterality: N/A;  . Cystoscopy  07/09/2011    Procedure: Erlene Quan;  Surgeon: Marissa Nestle, MD;  Location: [facility];  Service: Urology;  Laterality: N/A;  . Colon surgery    . Transurethral resection of prostate  07/11/2011    Procedure: TRANSURETHRAL RESECTION OF THE PROSTATE (TURP);  Surgeon: Marissa Nestle, MD;  Location: [facility];  Service: Urology;  Laterality: N/A;  . Cystoscopy with litholapaxy  07/11/2011    Procedure: CYSTOSCOPY WITH LITHOLAPAXY;  Surgeon: Marissa Nestle, MD;  Location: [facility];  Service: Urology;  Laterality: N/A;  . Cataract extraction w/phaco Left 08/23/2012    Procedure: CATARACT EXTRACTION PHACO AND INTRAOCULAR LENS PLACEMENT (IOC);  Surgeon: Tonny Branch, MD;  [facility]: [facility];  Service: Ophthalmology;  Laterality: Left;  CDE 16.64  . Colonoscopy N/A 09/16/2012    Procedure: COLONOSCOPY;  Surgeon: Rogene Houston, MD;  [facility];  Service: Endoscopy;  Laterality: N/A;  930  . Cataract extraction w/phaco Right 06/09/2013    Procedure: CATARACT EXTRACTION PHACO AND INTRAOCULAR LENS PLACEMENT (IOC) CDE=10.86;  Surgeon: Tonny Branch, MD;  [facility]: [facility];  Service: Ophthalmology;  Laterality: Right;  . Prostage cancer    . Hernia repair  [id number]     umbilical   . Robot assisted laparoscopic radical prostatectomy Bilateral 01/19/2015  Procedure: ROBOTIC ASSISTED LAPAROSCOPIC RADICAL PROSTATECTOMY AND BILATERAL PELVIC LYMPH NODE DISSECTION, LAPARASCOPIC  LYSIS OF ADHESIONS;  Surgeon: Ardis Hughs, MD;  [facility]: [facility];  Service: Urology;  Laterality: Bilateral;    No Known Allergies  Current Outpatient Prescriptions on File Prior to Visit  Medication Sig Dispense Refill  . aspirin EC 81 MG EC tablet Take 1 tablet (81 mg total) by mouth daily. 30 tablet 1  . atorvastatin (LIPITOR) 10 MG tablet Take 1 tablet (10 mg total) by mouth daily at 6 PM. 30 tablet 1  . docusate sodium (COLACE) 100 MG  capsule Take 1 capsule (100 mg total) by mouth 2 (two) times daily. 30 capsule 0  . Fish Oil OIL by Does not apply route.    Marland Kitchen HYDROcodone-acetaminophen (NORCO) 5-325 MG per tablet Take 1-2 tablets by mouth every 6 (six) hours as needed. (Patient not taking: Reported on 02/08/2015) 30 tablet 0  . Multiple Vitamins-Minerals (MULTIVITAMIN MEN PO) Take by mouth.    . pantoprazole (PROTONIX) 40 MG tablet TAKE 1 TABLET BY MOUTH ONCE DAILY. 90 tablet 0  . Polyvinyl Alcohol-Povidone (REFRESH OP) Apply 1-2 drops to eye daily as needed (dust in eyes.).    Marland Kitchen Probiotic CAPS Take 1 capsule by mouth every morning. Taking PB8    . senna (SENOKOT) 8.6 MG TABS tablet Take 2 tablets (17.2 mg total) by mouth daily. (Patient not taking: Reported on 02/08/2015) 120 each 0  . sulfamethoxazole-trimethoprim (BACTRIM DS,SEPTRA DS) 800-160 MG per tablet Take 1 tablet by mouth 2 (two) times daily. Start the day prior to foley removal appointment (Patient not taking: Reported on 01/25/2015) 6 tablet 0   No current facility-administered medications on file prior to visit.        Objective:   Physical Exam Blood pressure 120/70, pulse 64, temperature 98 F (36.7 C), height 6\' 2"  (1.88 m), weight 243 lb 6.4 oz (110.406 kg). Alert and oriented. Skin warm and dry. Oral mucosa is moist.   . Sclera anicteric, conjunctivae is pink. Thyroid not enlarged. No cervical lymphadenopathy. Lungs clear. Heart regular rate and rhythm.  Abdomen is soft. Bowel sounds are positive. No hepatomegaly. No abdominal masses felt. No tenderness.  No edema to lower extremities.         Assessment & Plan:  Personal hx of colon cancer. Due for surveillance in April of 2017. Seems to be doing well.  Will put on recall for April of this year

## 2015-06-05 NOTE — Patient Instructions (Signed)
Surveillance colonoscopy in April of this year.

## 2015-06-07 DIAGNOSIS — R278 Other lack of coordination: Secondary | ICD-10-CM | POA: Diagnosis not present

## 2015-06-07 DIAGNOSIS — M6281 Muscle weakness (generalized): Secondary | ICD-10-CM | POA: Diagnosis not present

## 2015-06-07 DIAGNOSIS — N393 Stress incontinence (female) (male): Secondary | ICD-10-CM | POA: Diagnosis not present

## 2015-07-11 DIAGNOSIS — E782 Mixed hyperlipidemia: Secondary | ICD-10-CM | POA: Diagnosis not present

## 2015-07-11 DIAGNOSIS — I10 Essential (primary) hypertension: Secondary | ICD-10-CM | POA: Diagnosis not present

## 2015-07-13 DIAGNOSIS — K219 Gastro-esophageal reflux disease without esophagitis: Secondary | ICD-10-CM | POA: Diagnosis not present

## 2015-07-13 DIAGNOSIS — R945 Abnormal results of liver function studies: Secondary | ICD-10-CM | POA: Diagnosis not present

## 2015-07-13 DIAGNOSIS — R1011 Right upper quadrant pain: Secondary | ICD-10-CM | POA: Diagnosis not present

## 2015-08-01 DIAGNOSIS — L03115 Cellulitis of right lower limb: Secondary | ICD-10-CM | POA: Diagnosis not present

## 2015-08-07 ENCOUNTER — Encounter (HOSPITAL_COMMUNITY): Payer: Medicare Other | Attending: Hematology & Oncology

## 2015-08-07 DIAGNOSIS — K219 Gastro-esophageal reflux disease without esophagitis: Secondary | ICD-10-CM | POA: Insufficient documentation

## 2015-08-07 DIAGNOSIS — C19 Malignant neoplasm of rectosigmoid junction: Secondary | ICD-10-CM | POA: Insufficient documentation

## 2015-08-07 DIAGNOSIS — C189 Malignant neoplasm of colon, unspecified: Secondary | ICD-10-CM | POA: Diagnosis not present

## 2015-08-07 DIAGNOSIS — Z85038 Personal history of other malignant neoplasm of large intestine: Secondary | ICD-10-CM | POA: Diagnosis not present

## 2015-08-07 DIAGNOSIS — G4730 Sleep apnea, unspecified: Secondary | ICD-10-CM

## 2015-08-07 DIAGNOSIS — Z87891 Personal history of nicotine dependence: Secondary | ICD-10-CM | POA: Insufficient documentation

## 2015-08-07 DIAGNOSIS — C180 Malignant neoplasm of cecum: Secondary | ICD-10-CM | POA: Diagnosis not present

## 2015-08-07 DIAGNOSIS — Z87442 Personal history of urinary calculi: Secondary | ICD-10-CM | POA: Insufficient documentation

## 2015-08-07 DIAGNOSIS — Z9049 Acquired absence of other specified parts of digestive tract: Secondary | ICD-10-CM | POA: Diagnosis not present

## 2015-08-07 DIAGNOSIS — N400 Enlarged prostate without lower urinary tract symptoms: Secondary | ICD-10-CM | POA: Diagnosis not present

## 2015-08-07 DIAGNOSIS — C61 Malignant neoplasm of prostate: Secondary | ICD-10-CM | POA: Diagnosis not present

## 2015-08-07 DIAGNOSIS — Z86010 Personal history of colonic polyps: Secondary | ICD-10-CM | POA: Insufficient documentation

## 2015-08-07 DIAGNOSIS — Z9889 Other specified postprocedural states: Secondary | ICD-10-CM | POA: Diagnosis not present

## 2015-08-07 LAB — CBC WITH DIFFERENTIAL/PLATELET
Basophils Absolute: 0.1 10*3/uL (ref 0.0–0.1)
Basophils Relative: 1 %
Eosinophils Absolute: 0.3 10*3/uL (ref 0.0–0.7)
Eosinophils Relative: 5 %
HEMATOCRIT: 43.4 % (ref 39.0–52.0)
HEMOGLOBIN: 14.5 g/dL (ref 13.0–17.0)
LYMPHS ABS: 1.9 10*3/uL (ref 0.7–4.0)
LYMPHS PCT: 27 %
MCH: 29.4 pg (ref 26.0–34.0)
MCHC: 33.4 g/dL (ref 30.0–36.0)
MCV: 88 fL (ref 78.0–100.0)
MONOS PCT: 7 %
Monocytes Absolute: 0.5 10*3/uL (ref 0.1–1.0)
NEUTROS ABS: 4.2 10*3/uL (ref 1.7–7.7)
NEUTROS PCT: 60 %
Platelets: 259 10*3/uL (ref 150–400)
RBC: 4.93 MIL/uL (ref 4.22–5.81)
RDW: 13.2 % (ref 11.5–15.5)
WBC: 6.9 10*3/uL (ref 4.0–10.5)

## 2015-08-07 LAB — COMPREHENSIVE METABOLIC PANEL
ALK PHOS: 64 U/L (ref 38–126)
ALT: 28 U/L (ref 17–63)
AST: 35 U/L (ref 15–41)
Albumin: 3.9 g/dL (ref 3.5–5.0)
Anion gap: 6 (ref 5–15)
BILIRUBIN TOTAL: 0.9 mg/dL (ref 0.3–1.2)
BUN: 15 mg/dL (ref 6–20)
CALCIUM: 9 mg/dL (ref 8.9–10.3)
CO2: 30 mmol/L (ref 22–32)
CREATININE: 0.93 mg/dL (ref 0.61–1.24)
Chloride: 103 mmol/L (ref 101–111)
Glucose, Bld: 103 mg/dL — ABNORMAL HIGH (ref 65–99)
Potassium: 4 mmol/L (ref 3.5–5.1)
SODIUM: 139 mmol/L (ref 135–145)
TOTAL PROTEIN: 7.1 g/dL (ref 6.5–8.1)

## 2015-08-08 ENCOUNTER — Encounter (HOSPITAL_COMMUNITY): Payer: Self-pay | Admitting: Hematology & Oncology

## 2015-08-08 ENCOUNTER — Encounter (HOSPITAL_BASED_OUTPATIENT_CLINIC_OR_DEPARTMENT_OTHER): Payer: Medicare Other | Admitting: Hematology & Oncology

## 2015-08-08 VITALS — BP 147/78 | HR 67 | Temp 97.9°F | Resp 20 | Wt 251.0 lb

## 2015-08-08 DIAGNOSIS — C189 Malignant neoplasm of colon, unspecified: Secondary | ICD-10-CM

## 2015-08-08 DIAGNOSIS — Z85038 Personal history of other malignant neoplasm of large intestine: Secondary | ICD-10-CM | POA: Diagnosis not present

## 2015-08-08 DIAGNOSIS — Z8546 Personal history of malignant neoplasm of prostate: Secondary | ICD-10-CM | POA: Diagnosis not present

## 2015-08-08 DIAGNOSIS — C61 Malignant neoplasm of prostate: Secondary | ICD-10-CM

## 2015-08-08 DIAGNOSIS — K909 Intestinal malabsorption, unspecified: Secondary | ICD-10-CM

## 2015-08-08 LAB — CEA: CEA: 4 ng/mL (ref 0.0–4.7)

## 2015-08-08 NOTE — Progress Notes (Signed)
Wende Neighbors, MD  [place] [address]   DIAGNOSIS:  Stage II-A carcinoma of the cecum R hemicolectomy on 07/07/2011, no additional therapy Colonoscopy on 07/2012 with sessile adenoma Adenocarcinoma of prostate s/p protatectomy on 01/19/2015, Gleason score 4 + 3 = 7, pT2c, pN0 stage   CURRENT THERAPY: Observation  INTERVAL HISTORY: William Burns 70 y.o. male returns for follow-up of a stage IIa carcinoma of the cecum. He underwent a right hemicolectomy in February 2013. He has had problems with kidney stones and follows with a urologist.   The patient had a Robotic Assisted Prostactecomy completed on 01/19/15 for prostate cancer.     William Burns is accompanied by his wife. I personally reviewed and went over laboratory studies with the patient and his wife.   He is feeling wonderful. Describes his appetite as "too good". Denies any new pain. Denies chest pain.   Reports cellulitis on his left leg after hitting his leg on a trailer hitch. Describies it as red and warm. He has been on keflex since last Wednesday with 3 days left. He also has a previous dog bite injury that intermittently itches.   The patient complains of abdominal discomfort. He mentioned this while at Dr. Juel Burrow office where they believed it may be a hernia. Denies changes in his bowel habits or vomiting. He sometimes gets nauseous when he is hungry.  The patient had blood work done while at Dr. Juel Burrow where he was told he had abnormal liver functions. We reviewed his LFT's today which are normal.   Weight is stable. He denies any new pain. No change in bowel habits.   MEDICAL HISTORY: Past Medical History  Diagnosis Date  . BPH (benign prostatic hyperplasia)   . Kidney stones 2011  . Tooth infection 07/03/2011  . Colonic mass 07/03/2011  . Erosive gastritis 07/03/2011  . Renal calculus   . Cataract   . Cancer ([place])     colon  . Difficult intubation     extra long tube for intub per wife and  slow to wake up   . GERD (gastroesophageal reflux disease)     has Microcytic hypochromic anemia; Hx of colonic polyp; Hyperglycemia; BPH (benign prostatic hyperplasia); Tooth infection; Adenocarcinoma of colon ([place]); Erosive gastritis; Calculus, bladder; Short-segment Barrett's esophagus; Diarrhea; Kidney stone on left side; Prostate cancer ([place]); Transient global amnesia; GERD (gastroesophageal reflux disease); and TIA (transient ischemic attack) on his problem list.     has No Known Allergies.  William Burns does not currently have medications on file.  SURGICAL HISTORY: Past Surgical History  Procedure Laterality Date  . Prostate biopsy    . Esophagogastroduodenoscopy  07/03/2011    Procedure: ESOPHAGOGASTRODUODENOSCOPY (EGD);  Surgeon: Rogene Houston, MD;  [facility];  Service: Endoscopy;  Laterality: N/A;  . Colonoscopy  07/03/2011    Procedure: COLONOSCOPY;  Surgeon: Rogene Houston, MD;  [facility];  Service: Endoscopy;  Laterality: N/A;  . Partial colectomy  07/04/2011    Procedure: PARTIAL COLECTOMY;  Surgeon: Jamesetta So, MD;  [facility];  Service: General;  Laterality: N/A;  . Cystoscopy  07/09/2011    Procedure: Erlene Quan;  Surgeon: Marissa Nestle, MD;  [facility];  Service: Urology;  Laterality: N/A;  . Colon surgery    . Transurethral resection of prostate  07/11/2011    Procedure: TRANSURETHRAL RESECTION OF THE PROSTATE (TURP);  Surgeon: Marissa Nestle, MD;  [facility];  Service: Urology;  Laterality: N/A;  . Cystoscopy with litholapaxy  07/11/2011    Procedure: CYSTOSCOPY WITH LITHOLAPAXY;  Surgeon: Marissa Nestle, MD;  [facility];  Service: Urology;  Laterality: N/A;  . Cataract extraction w/phaco Left 08/23/2012    Procedure: CATARACT EXTRACTION PHACO AND INTRAOCULAR LENS PLACEMENT (IOC);  Surgeon: Tonny Branch, MD;  [facility];  Service: Ophthalmology;  Laterality: Left;  CDE 16.64  . Colonoscopy N/A  09/16/2012    Procedure: COLONOSCOPY;  Surgeon: Rogene Houston, MD;  [facility];  Service: Endoscopy;  Laterality: N/A;  930  . Cataract extraction w/phaco Right 06/09/2013    Procedure: CATARACT EXTRACTION PHACO AND INTRAOCULAR LENS PLACEMENT (IOC) CDE=10.86;  Surgeon: Tonny Branch, MD;  [facility];  Service: Ophthalmology;  Laterality: Right;  . Prostage cancer    . Hernia repair  [id number]     umbilical   . Robot assisted laparoscopic radical prostatectomy Bilateral 01/19/2015    Procedure: ROBOTIC ASSISTED LAPAROSCOPIC RADICAL PROSTATECTOMY AND BILATERAL PELVIC LYMPH NODE DISSECTION, LAPARASCOPIC  LYSIS OF ADHESIONS;  Surgeon: Ardis Hughs, MD;  [facility];  Service: Urology;  Laterality: Bilateral;    SOCIAL HISTORY: Social History   Social History  . Marital Status: Married    Spouse Name: N/A  . Number of Children: N/A  . Years of Education: N/A   Occupational History  . farmer     tobacco; cattle; Sales executive   Social History Main Topics  . Smoking status: Former Smoker -- 2.00 packs/day for 25 years    Types: Cigarettes    Quit date: 06/02/1989  . Smokeless tobacco: Never Used  . Alcohol Use: No  . Drug Use: No  . Sexual Activity: Not Currently   Other Topics Concern  . Not on file   Social History Narrative    FAMILY HISTORY: Family History  Problem Relation Age of Onset  . Alzheimer's disease Mother   . Alzheimer's disease Father   . Aneurysm Brother     brain  . Anuerysm Brother   . Colon cancer Neg Hx   . Healthy Son   . Healthy Son     Review of Systems  Constitutional: Negative for fever, chills, weight loss and malaise/fatigue.  HENT: Negative for congestion, hearing loss, nosebleeds, sore throat and tinnitus.   Eyes: Negative for blurred vision, double vision, pain and discharge.  Respiratory: Negative for cough, hemoptysis, sputum production, shortness of breath and wheezing.   Cardiovascular: Negative for chest pain,  palpitations, claudication, leg swelling and PND.  Gastrointestinal: Positive for abdominal discomfort. Negative for heartburn, nausea, vomiting, diarrhea, constipation, blood in stool and melena.  Genitourinary: Negative for dysuria, urgency, frequency and hematuria.  Musculoskeletal: Negative for myalgias, joint pain and falls.  Skin: Negative for itching and rash.  Neurological: Negative for dizziness, tingling, tremors, sensory change, speech change, focal weakness, seizures, loss of consciousness, weakness and headaches.  Endo/Heme/Allergies: Does not bruise/bleed easily.  Psychiatric/Behavioral: Negative for depression, suicidal ideas, memory loss and substance abuse. The patient is not nervous/anxious and does not have insomnia.   14 point review of systems was performed and is negative except as detailed under history of present illness and above   PHYSICAL EXAMINATION  ECOG PERFORMANCE STATUS: 0 - Asymptomatic  Filed Vitals:   08/08/15 1144  BP: 147/78  Pulse: 67  Temp: 97.9 F (36.6 C)  Resp: 20    Physical Exam  Constitutional: He is oriented to person, place,  and time and well-developed, well-nourished, and in no distress.  HENT:  Head: Normocephalic and atraumatic.  Nose: Nose normal.  Mouth/Throat: Oropharynx is clear and moist. No oropharyngeal exudate.  Eyes: Conjunctivae and EOM are normal. Pupils are equal, round, and reactive to light. Right eye exhibits no discharge. Left eye exhibits no discharge. No scleral icterus.  Neck: Normal range of motion. Neck supple. No tracheal deviation present. No thyromegaly present.  Cardiovascular: Normal rate, regular rhythm and normal heart sounds.  Exam reveals no gallop and no friction rub.   No murmur heard. Pulmonary/Chest: Effort normal and breath sounds normal. He has no wheezes. He has no rales.  Abdominal: Soft. Bowel sounds are normal. He exhibits no distension and no mass. There is no tenderness. There is no rebound  and no guarding.  Musculoskeletal: Normal range of motion. He exhibits no edema.  Lymphadenopathy:    He has no cervical adenopathy.  Neurological: He is alert and oriented to person, place, and time. He has normal reflexes. No cranial nerve deficit. Gait normal. Coordination normal.  Skin: Skin is warm and dry. No rash noted. Mild erythema noted on the skin of lower tibia of the left leg. Psychiatric: Mood, memory, affect and judgment normal.  Nursing note and vitals reviewed.   LABORATORY DATA: I have reviewed the data as listed. CBC    Component Value Date/Time   WBC 6.9 08/07/2015 1108   RBC 4.93 08/07/2015 1108   RBC 3.26* 07/01/2011 2229   HGB 14.5 08/07/2015 1108   HCT 43.4 08/07/2015 1108   PLT 259 08/07/2015 1108   MCV 88.0 08/07/2015 1108   MCH 29.4 08/07/2015 1108   MCHC 33.4 08/07/2015 1108   RDW 13.2 08/07/2015 1108   LYMPHSABS 1.9 08/07/2015 1108   MONOABS 0.5 08/07/2015 1108   EOSABS 0.3 08/07/2015 1108   BASOSABS 0.1 08/07/2015 1108   CMP     Component Value Date/Time   NA 139 08/07/2015 1108   K 4.0 08/07/2015 1108   CL 103 08/07/2015 1108   CO2 30 08/07/2015 1108   GLUCOSE 103* 08/07/2015 1108   BUN 15 08/07/2015 1108   CREATININE 0.93 08/07/2015 1108   CALCIUM 9.0 08/07/2015 1108   PROT 7.1 08/07/2015 1108   ALBUMIN 3.9 08/07/2015 1108   AST 35 08/07/2015 1108   ALT 28 08/07/2015 1108   ALKPHOS 64 08/07/2015 1108   BILITOT 0.9 08/07/2015 1108   GFRNONAA >60 08/07/2015 1108   GFRAA >60 08/07/2015 1108   Results for William Burns, William Burns (MRN [id number]) as of 08/18/2015 12:11  Ref. Range 07/28/2012 09:44 10/20/2012 10:58 01/12/2013 11:03 04/06/2013 10:49 07/29/2013 08:27 02/01/2014 09:36 08/02/2014 09:03 11/07/2014 09:24 02/06/2015 09:23 08/07/2015 11:08  CEA Latest Ref Range: 0.0-4.7 ng/mL 1.8 1.7 1.6 1.6 1.7 1.7 3.2 3.1 2.5 4.0     ASSESSMENT and THERAPY PLAN:   Stage IIA Colorectal Cancer, diagnosed 07/2011 R hemicolectomy with Dr. Arnoldo Morale CEA not elevated in  the pre-operative setting  70 year old male with a history of stage II colon cancer. Laboratory studies were reviewed with the patient and were excellent. We reviewed current NCCN guidelines: NCCN guidelines for surveillance for Colon cancer are as follows (1.2017): A. Stage I 1. Colonoscopy at year 1 A. If advanced adenoma, repeat in 1 year B. If no advanced adenoma, repeat in 3 years, and then every 5 years.  B. Stage II, Stage III 1. H+P every 3-6 months x 2 years and then every 6 months for a total of 5 years  2. CEA every 3-6 months x 2 years and then every 6 months for a total of 5 years  3. CT CAP every 6-12 months (category 2B for frequency < 12 months) for a total of 5 years . 4.  Colonoscopy in 1 year except if no preoperative colonoscopy due to obstructing lesion, colonoscopy in 3-6 months.  A. If advanced adenoma, repeat in 1 year B. If no advanced adenoma, repeat in 3 years, then every 5 years 5. PET/CT scan is not recommended.   C. Stage IV 1. H+P every 3-6 months x 2 years and then every 6 months for a total of 5 years  2. CEA every 3 months x 2 years and then every 6 months for a total of 3- 5 years  3. CT CAP every 3-6 months (category 2B for frequency < 6 months) x 2 years., then every 6-12 months for a total of 5 years . 4. Colonoscopy in 1 year except if no preoperative colonoscopy due to obstructing lesion, colonoscopy in 3-6 months.  A. If advanced adenoma, repeat in 1 year B. If no advanced adenoma, repeat in 3 years, then every 5 years  We will proceed with CT Abdomen/pelvis based upon NCCN guidelines. He will be notified of the results when available. He will be scheduled for ongoing surveillance with return in 6 months.  Due for surveillance C-scope in April with Dr. Laural Golden.    Prostate Cancer  He underwent a prostatectomy last year for an adenocarcinoma of the prostate. He continues to follow with his urologist.   All questions were answered. The patient  knows to call the clinic with any problems, questions or concerns. We can certainly see the patient much sooner if necessary.   This document serves as a record of services personally performed by Ancil Linsey, MD. It was created on her behalf by Arlyce Harman, a trained medical scribe. The creation of this record is based on the scribe's personal observations and the provider's statements to them. This document has been checked and approved by the attending provider.  I have reviewed the above documentation for accuracy and completeness, and I agree with the above.  This note was electronically signed.  Kelby Fam. Whitney Muse, MD

## 2015-08-08 NOTE — Patient Instructions (Addendum)
..  [place] at [facility] Discharge Instructions  RECOMMENDATIONS MADE BY THE CONSULTANT AND ANY TEST RESULTS WILL BE SENT TO YOUR REFERRING PHYSICIAN.  Discussed recommendations for scanning with stage 2 colon cancer-up to clinician We will do CT scan of abdomen  CEA was 4 this is a little higher than its been but not out of normal range  Liver studies look good today Finish the course of antibiotics if not better follow up with Dr. Nevada Crane  Thank you for choosing [facility] at [facility] to provide your oncology and hematology care.  To afford each patient quality time with our provider, please arrive at least 15 minutes before your scheduled appointment time.   Beginning January 23rd 2017 lab work for the [facility] will be done in the  Main lab at [facility] on 1st floor. If you have a lab appointment with the [facility] please come in thru the  Main Entrance and check in at the main information desk  You need to re-schedule your appointment should you arrive 10 or more minutes late.  We strive to give you quality time with our providers, and arriving late affects you and other patients whose appointments are after yours.  Also, if you no show three or more times for appointments you may be dismissed from the clinic at the providers discretion.     Again, thank you for choosing [facility].  Our hope is that these requests will decrease the amount of time that you wait before being seen by our physicians.       _____________________________________________________________  Should you have questions after your visit to [facility], please contact our office at [phone] between the hours of 8:30 a.m. and 4:30 p.m.  Voicemails left after 4:30 p.m. will not be returned until the following business day.  For prescription refill requests, have your pharmacy contact our office.         Resources For Cancer  Patients and their Caregivers ? American Cancer Society: Can assist with transportation, wigs, general needs, runs Look Good Feel Better.        330-488-4736 ? Cancer Care: Provides financial assistance, online support groups, medication/co-pay assistance.  1-800-813-HOPE 4787252057) ? [facility] Assists [place] cancer patients and their families through emotional , educational and financial support.  8082148654 ? [place] DSS Where to apply for food stamps, Medicaid and utility assistance. (618)379-6645 ? [facility]: Transportation to medical appointments. (563)666-5288 ? Social Security Administration: May apply for disability if have a Stage IV cancer. 312 426 9626 (802)474-1783 ? [employer], Disability and Transit Services: Assists with nutrition, care and transit needs. (802)674-9463

## 2015-08-16 ENCOUNTER — Ambulatory Visit (HOSPITAL_COMMUNITY)
Admission: RE | Admit: 2015-08-16 | Discharge: 2015-08-16 | Disposition: A | Discharge status: Home / Self Care | Payer: Medicare Other | Source: Ambulatory Visit | Attending: Hematology & Oncology | Admitting: Hematology & Oncology

## 2015-08-16 DIAGNOSIS — Z9049 Acquired absence of other specified parts of digestive tract: Secondary | ICD-10-CM | POA: Insufficient documentation

## 2015-08-16 DIAGNOSIS — R599 Enlarged lymph nodes, unspecified: Secondary | ICD-10-CM | POA: Diagnosis not present

## 2015-08-16 DIAGNOSIS — C189 Malignant neoplasm of colon, unspecified: Secondary | ICD-10-CM

## 2015-08-16 DIAGNOSIS — N200 Calculus of kidney: Secondary | ICD-10-CM | POA: Diagnosis not present

## 2015-08-16 DIAGNOSIS — K429 Umbilical hernia without obstruction or gangrene: Secondary | ICD-10-CM | POA: Diagnosis not present

## 2015-08-16 DIAGNOSIS — Z9889 Other specified postprocedural states: Secondary | ICD-10-CM | POA: Insufficient documentation

## 2015-08-16 MED ORDER — IOHEXOL 300 MG/ML  SOLN
100.0000 mL | Freq: Once | INTRAMUSCULAR | Status: AC | PRN
  Administered 2015-08-16: 100 mL via INTRAVENOUS

## 2015-08-20 ENCOUNTER — Other Ambulatory Visit (HOSPITAL_COMMUNITY): Payer: Self-pay | Admitting: Oncology

## 2015-08-20 DIAGNOSIS — C61 Malignant neoplasm of prostate: Secondary | ICD-10-CM

## 2015-08-22 ENCOUNTER — Telehealth (HOSPITAL_COMMUNITY): Payer: Self-pay | Admitting: Hematology & Oncology

## 2015-08-22 NOTE — Telephone Encounter (Signed)
Discussed CT results and MRI pelvis from last April. Findings are concerning for prostate cancer recurrence but advised patient needs to have repeat PSA. He cannot recall when last PSA was drawn.  If PSA is 0, will pursue other imaging vs.biopsy. Will discuss further with patient/wife in am. They will call back in am to discuss further. Donald Pore MD

## 2015-08-23 DIAGNOSIS — D225 Melanocytic nevi of trunk: Secondary | ICD-10-CM | POA: Diagnosis not present

## 2015-08-23 DIAGNOSIS — C61 Malignant neoplasm of prostate: Secondary | ICD-10-CM | POA: Diagnosis not present

## 2015-08-23 DIAGNOSIS — X32XXXD Exposure to sunlight, subsequent encounter: Secondary | ICD-10-CM | POA: Diagnosis not present

## 2015-08-23 DIAGNOSIS — L570 Actinic keratosis: Secondary | ICD-10-CM | POA: Diagnosis not present

## 2015-08-27 DIAGNOSIS — Z8546 Personal history of malignant neoplasm of prostate: Secondary | ICD-10-CM | POA: Diagnosis not present

## 2015-08-27 DIAGNOSIS — Z0000 Encounter for general adult medical examination without abnormal findings: Secondary | ICD-10-CM | POA: Diagnosis not present

## 2015-08-28 ENCOUNTER — Other Ambulatory Visit (HOSPITAL_COMMUNITY): Payer: Self-pay | Admitting: Oncology

## 2015-08-28 DIAGNOSIS — R591 Generalized enlarged lymph nodes: Secondary | ICD-10-CM

## 2015-08-30 ENCOUNTER — Other Ambulatory Visit (HOSPITAL_COMMUNITY): Payer: Medicare Other

## 2015-08-30 ENCOUNTER — Encounter (HOSPITAL_COMMUNITY): Payer: Self-pay | Admitting: Hematology & Oncology

## 2015-08-30 ENCOUNTER — Encounter (HOSPITAL_BASED_OUTPATIENT_CLINIC_OR_DEPARTMENT_OTHER): Payer: Medicare Other | Admitting: Hematology & Oncology

## 2015-08-30 VITALS — BP 130/66 | HR 62 | Temp 98.4°F | Resp 18 | Wt 241.0 lb

## 2015-08-30 DIAGNOSIS — Z85038 Personal history of other malignant neoplasm of large intestine: Secondary | ICD-10-CM

## 2015-08-30 DIAGNOSIS — C61 Malignant neoplasm of prostate: Secondary | ICD-10-CM

## 2015-08-30 DIAGNOSIS — Z8546 Personal history of malignant neoplasm of prostate: Secondary | ICD-10-CM | POA: Diagnosis not present

## 2015-08-30 DIAGNOSIS — R9389 Abnormal findings on diagnostic imaging of other specified body structures: Secondary | ICD-10-CM

## 2015-08-30 DIAGNOSIS — C189 Malignant neoplasm of colon, unspecified: Secondary | ICD-10-CM

## 2015-08-30 NOTE — Progress Notes (Signed)
William Neighbors, MD  [place] [address]   DIAGNOSIS:  Stage II-A carcinoma of the cecum R hemicolectomy on 07/07/2011, no additional therapy Colonoscopy on 07/2012 with sessile adenoma Adenocarcinoma of prostate s/p protatectomy on 01/19/2015, Gleason score 4 + 3 = 7, pT2c, pN0 stage   CURRENT THERAPY: Observation  INTERVAL HISTORY: William Burns 70 y.o. male returns for follow-up of a stage IIa carcinoma of the cecum. He underwent a right hemicolectomy in February 2013. He has had problems with kidney stones and follows with a urologist.   The patient had a Robotic Assisted Prostactecomy completed on 01/19/15 for prostate cancer.     Mr. Merkel was here with his wife today.  He said that when he sneezes he feels movement in his abdomen. This is not painful, it just feels different since his surgery. He has had no abdominal pain and no new pain since this surgery.   He is still having some urinary leaking, but this has gotten better. He has not had a UTI recently.  He said that all of this weighs heavy on him.  We discussed his CT scan results in detail.   Has a PET scan scheduled for 09/06/15 at [facility].   He is going [place] tomorrow [place] the [place].  MEDICAL HISTORY: Past Medical History  Diagnosis Date  . BPH (benign prostatic hyperplasia)   . Kidney stones 2011  . Tooth infection 07/03/2011  . Colonic mass 07/03/2011  . Erosive gastritis 07/03/2011  . Renal calculus   . Cataract   . Cancer ([place])     colon  . Difficult intubation     extra long tube for intub per wife and slow to wake up   . GERD (gastroesophageal reflux disease)     has Microcytic hypochromic anemia; Hx of colonic polyp; Hyperglycemia; BPH (benign prostatic hyperplasia); Tooth infection; Adenocarcinoma of colon ([place]); Erosive gastritis; Calculus, bladder; Short-segment Barrett's esophagus; Diarrhea; Kidney stone on left side; Prostate cancer ([place]); Transient global  amnesia; GERD (gastroesophageal reflux disease); and TIA (transient ischemic attack) on his problem list.     has No Known Allergies.  Mr. Bjornson does not currently have medications on file.  SURGICAL HISTORY: Past Surgical History  Procedure Laterality Date  . Prostate biopsy    . Esophagogastroduodenoscopy  07/03/2011    Procedure: ESOPHAGOGASTRODUODENOSCOPY (EGD);  Surgeon: Rogene Houston, MD;  [facility];  Service: Endoscopy;  Laterality: N/A;  . Colonoscopy  07/03/2011    Procedure: COLONOSCOPY;  Surgeon: Rogene Houston, MD;  [facility];  Service: Endoscopy;  Laterality: N/A;  . Partial colectomy  07/04/2011    Procedure: PARTIAL COLECTOMY;  Surgeon: Jamesetta So, MD;  Location: [facility];  Service: General;  Laterality: N/A;  . Cystoscopy  07/09/2011    Procedure: Erlene Quan;  Surgeon: Marissa Nestle, MD;  Location: [facility];  Service: Urology;  Laterality: N/A;  . Colon surgery    . Transurethral resection of prostate  07/11/2011    Procedure: TRANSURETHRAL RESECTION OF THE PROSTATE (TURP);  Surgeon: Marissa Nestle, MD;  Location: [facility];  Service: Urology;  Laterality: N/A;  . Cystoscopy with litholapaxy  07/11/2011    Procedure: CYSTOSCOPY WITH LITHOLAPAXY;  Surgeon: Marissa Nestle, MD;  Location: [facility];  Service: Urology;  Laterality: N/A;  . Cataract extraction w/phaco Left 08/23/2012    Procedure: CATARACT EXTRACTION PHACO AND INTRAOCULAR LENS PLACEMENT (IOC);  Surgeon: Tonny Branch, MD;  Location: [facility];  Service: Ophthalmology;  Laterality: Left;  CDE 16.64  . Colonoscopy N/A 09/16/2012    Procedure: COLONOSCOPY;  Surgeon: Rogene Houston, MD;  [facility];  Service: Endoscopy;  Laterality: N/A;  930  . Cataract extraction w/phaco Right 06/09/2013    Procedure: CATARACT EXTRACTION PHACO AND INTRAOCULAR LENS PLACEMENT (IOC) CDE=10.86;  Surgeon: Tonny Branch, MD;  Location: [facility];  Service: Ophthalmology;  Laterality: Right;  . Prostage  cancer    . Hernia repair  [id number]     umbilical   . Robot assisted laparoscopic radical prostatectomy Bilateral 01/19/2015    Procedure: ROBOTIC ASSISTED LAPAROSCOPIC RADICAL PROSTATECTOMY AND BILATERAL PELVIC LYMPH NODE DISSECTION, LAPARASCOPIC  LYSIS OF ADHESIONS;  Surgeon: Ardis Hughs, MD;  Location: [facility];  Service: Urology;  Laterality: Bilateral;    SOCIAL HISTORY: Social History   Social History  . Marital Status: Married    Spouse Name: N/A  . Number of Children: N/A  . Years of Education: N/A   Occupational History  . farmer     tobacco; cattle; Sales executive   Social History Main Topics  . Smoking status: Former Smoker -- 2.00 packs/day for 25 years    Types: Cigarettes    Quit date: 06/02/1989  . Smokeless tobacco: Never Used  . Alcohol Use: No  . Drug Use: No  . Sexual Activity: Not Currently   Other Topics Concern  . Not on file   Social History Narrative    FAMILY HISTORY: Family History  Problem Relation Age of Onset  . Alzheimer's disease Mother   . Alzheimer's disease Father   . Aneurysm Brother     brain  . Anuerysm Brother   . Colon cancer Neg Hx   . Healthy Son   . Healthy Son     Review of Systems  Constitutional: Negative for fever, chills, weight loss and malaise/fatigue.  HENT: Negative for congestion, hearing loss, nosebleeds, sore throat and tinnitus.   Eyes: Negative for blurred vision, double vision, pain and discharge.  Respiratory: Negative for cough, hemoptysis, sputum production, shortness of breath and wheezing.   Cardiovascular: Positive for leg swelling. Negative for chest pain, palpitations, claudication and PND.  Gastrointestinal: Negative for heartburn, nausea, vomiting, diarrhea, constipation, blood in stool and melena.  Genitourinary: Positive for urinary leakage. Negative for dysuria, urgency, frequency and hematuria.  Musculoskeletal: Negative for myalgias, joint pain and falls.  Skin: Negative for itching and rash.    Neurological: Negative for dizziness, tingling, tremors, sensory change, speech change, focal weakness, seizures, loss of consciousness, weakness and headaches.  Endo/Heme/Allergies: Does not bruise/bleed easily.  Psychiatric/Behavioral: Negative for depression, suicidal ideas, memory loss and substance abuse. The patient is not nervous/anxious and does not have insomnia.   14 point review of systems was performed and is negative except as detailed under history of present illness and above   PHYSICAL EXAMINATION  ECOG PERFORMANCE STATUS: 0 - Asymptomatic  Filed Vitals:   08/30/15 1359  BP: 130/66  Pulse: 62  Temp: 98.4 F (36.9 C)  Resp: 18    Physical Exam  Constitutional: He is oriented to person, place, and time and well-developed, well-nourished, and in no distress.  HENT:  Head: Normocephalic and atraumatic.  Nose: Nose normal.  Mouth/Throat: Oropharynx is clear and moist. No oropharyngeal exudate.  Eyes: Conjunctivae and EOM are normal. Pupils are equal, round, and reactive to light. Right eye exhibits no discharge. Left eye exhibits no discharge. No  scleral icterus.  Neck: Normal range of motion. Neck supple. No tracheal deviation present. No thyromegaly present.  Cardiovascular: Normal rate, regular rhythm and normal heart sounds.  Exam reveals no gallop and no friction rub.   No murmur heard. Pulmonary/Chest: Effort normal and breath sounds normal. He has no wheezes. He has no rales.  Abdominal: Soft. Bowel sounds are normal. He exhibits no distension and no mass. There is no tenderness. There is no rebound and no guarding.  Musculoskeletal: Normal range of motion. He exhibits no edema.  Lymphadenopathy:    He has no cervical adenopathy.  Neurological: He is alert and oriented to person, place, and time. He has normal reflexes. No cranial nerve deficit. Gait normal. Coordination normal.  Skin: Skin is warm and dry. No rash noted.  Psychiatric: Mood, memory, affect and  judgment normal.  Nursing note and vitals reviewed.  LABORATORY DATA: I have reviewed the data as listed. CBC    Component Value Date/Time   WBC 6.9 08/07/2015 1108   RBC 4.93 08/07/2015 1108   RBC 3.26* 07/01/2011 2229   HGB 14.5 08/07/2015 1108   HCT 43.4 08/07/2015 1108   PLT 259 08/07/2015 1108   MCV 88.0 08/07/2015 1108   MCH 29.4 08/07/2015 1108   MCHC 33.4 08/07/2015 1108   RDW 13.2 08/07/2015 1108   LYMPHSABS 1.9 08/07/2015 1108   MONOABS 0.5 08/07/2015 1108   EOSABS 0.3 08/07/2015 1108   BASOSABS 0.1 08/07/2015 1108   CMP     Component Value Date/Time   NA 139 08/07/2015 1108   K 4.0 08/07/2015 1108   CL 103 08/07/2015 1108   CO2 30 08/07/2015 1108   GLUCOSE 103* 08/07/2015 1108   BUN 15 08/07/2015 1108   CREATININE 0.93 08/07/2015 1108   CALCIUM 9.0 08/07/2015 1108   PROT 7.1 08/07/2015 1108   ALBUMIN 3.9 08/07/2015 1108   AST 35 08/07/2015 1108   ALT 28 08/07/2015 1108   ALKPHOS 64 08/07/2015 1108   BILITOT 0.9 08/07/2015 1108   GFRNONAA >60 08/07/2015 1108   GFRAA >60 08/07/2015 1108     RADIOGRAPHIC STUDIES: I have personally reviewed the radiological images as listed and agreed with the findings in the report. Study Result     CLINICAL DATA: 70 year old male with history of colon and prostate cancer. Status post partial colectomy and prostatectomy. Currently asymptomatic.  EXAM: CT ABDOMEN AND PELVIS WITH CONTRAST  TECHNIQUE: Multidetector CT imaging of the abdomen and pelvis was performed using the standard protocol following bolus administration of intravenous contrast.  CONTRAST: 16mL OMNIPAQUE IOHEXOL 300 MG/ML SOLN  COMPARISON: CT the abdomen and pelvis 10/03/2013.  FINDINGS: Lower chest: Unremarkable.  Hepatobiliary: Mild diffuse decreased attenuation throughout the hepatic parenchyma, compatible with mild hepatic steatosis. No definite cystic or solid hepatic lesions. No intra or extrahepatic biliary ductal  dilatation. Gallbladder is normal in appearance.  Pancreas: No pancreatic mass. No pancreatic ductal dilatation. No pancreatic or peripancreatic fluid or inflammatory changes.  Spleen: Unremarkable.  Adrenals/Urinary Tract: Several tiny nonobstructive calculi are present within the collecting systems of the kidneys bilaterally, measuring up to 6 mm in the lower pole collecting system of the left kidney. No additional calculi are noted along the course of either ureter or within the lumen of the urinary bladder. No hydroureteronephrosis. Sub cm low-attenuation lesions in the left kidney are too small to definitively characterize, but are statistically likely to represent tiny cysts. Exophytic 1.7 cm simple cyst in the upper pole of the left kidney. Right  kidney is normal in appearance. Urinary bladder is normal in appearance. Bilateral adrenal glands are normal in appearance.  Stomach/Bowel: Normal appearance of the stomach. No pathologic dilatation of small bowel or colon. Status post appendectomy.  Vascular/Lymphatic: Atherosclerosis throughout the abdominal and pelvic vasculature, without evidence of aneurysm or dissection. There multiple borderline enlarged and enlarged pelvic lymph nodes bilaterally, in the external iliac and inguinal regions. These include an 18 mm short axis right external iliac lymph node (image 120 of series 6), a 20 mm left external iliac lymph node (image 133 of series 6) and a 15 mm left common femoral lymph node (image 138 of series 6).  Reproductive: Status post prostatectomy. Seminal vesicles are unremarkable in appearance.  Other: Postoperative changes of mesh repair for ventral hernia are again noted. No significant volume of ascites. No pneumoperitoneum.  Musculoskeletal: 1.4 x 2.3 cm sclerotic lesion in the superior aspect of the left ilium is unchanged. Well-defined sclerotic lesion with narrow zone of transition measuring 7 mm in the  left side of the sacrum at the level of S1 is unchanged, presumably a small bone island. No new aggressive appearing lytic or blastic lesions are noted elsewhere in the visualized portions of the axial or appendicular skeleton. In the subcutaneous fat of the left lower anterior abdominal wall there is some irregular soft tissue stranding best demonstrated on image 91 of series 6, which is of uncertain etiology and significance, but is favored to represent a small soft tissue contusion. Tiny umbilical hernia containing only omental fat.  IMPRESSION: 1. Interval development of bilateral pelvic lymphadenopathy, as above. Correlation with PSA levels is recommended. If there is clinical concern for a metastatic prostate cancer, further evaluation with PET-CT could be considered at this time. 2. No other definite findings of metastatic disease elsewhere in the abdomen or pelvis. 3. Soft tissue stranding in the subcutaneous fat of the lower left anterior abdominal wall. This is of uncertain etiology and significance, but favored to represent a small contusion. Correlation with physical examination is recommended. 4. Multiple nonobstructive calculi are present within the collecting systems of the kidneys bilaterally measuring up to 6 mm in the lower pole of left kidney. No ureteral stones or findings of urinary tract obstruction are noted at this time. 5. Additional incidental findings, as above. These results will be called to the ordering clinician or representative by the Radiologist Assistant, and communication documented in the PACS or zVision Dashboard.   Electronically Signed  By: Vinnie Langton M.D.  On: 08/16/2015 15:55    ASSESSMENT and THERAPY PLAN:   Stage IIA Colorectal Cancer, diagnosed 07/2011 R hemicolectomy with Dr. Arnoldo Morale CEA not elevated in the pre-operative setting  70 year old male with a history of stage II colon cancer. Laboratory studies were reviewed  with the patient and were excellent. We reviewed current NCCN guidelines: NCCN guidelines for surveillance for Colon cancer are as follows (1.2017): A. Stage I 1. Colonoscopy at year 1 A. If advanced adenoma, repeat in 1 year B. If no advanced adenoma, repeat in 3 years, and then every 5 years.  B. Stage II, Stage III 1. H+P every 3-6 months x 2 years and then every 6 months for a total of 5 years  2. CEA every 3-6 months x 2 years and then every 6 months for a total of 5 years  3. CT CAP every 6-12 months (category 2B for frequency < 12 months) for a total of 5 years . 4.  Colonoscopy in 1 year except  if no preoperative colonoscopy due to obstructing lesion, colonoscopy in 3-6 months.  A. If advanced adenoma, repeat in 1 year B. If no advanced adenoma, repeat in 3 years, then every 5 years 5. PET/CT scan is not recommended.   C. Stage IV 1. H+P every 3-6 months x 2 years and then every 6 months for a total of 5 years  2. CEA every 3 months x 2 years and then every 6 months for a total of 3- 5 years  3. CT CAP every 3-6 months (category 2B for frequency < 6 months) x 2 years., then every 6-12 months for a total of 5 years . 4. Colonoscopy in 1 year except if no preoperative colonoscopy due to obstructing lesion, colonoscopy in 3-6 months.  A. If advanced adenoma, repeat in 1 year B. If no advanced adenoma, repeat in 3 years, then every 5 years He will be scheduled for ongoing surveillance with return in 6 months.  Due for surveillance C-scope in April with Dr. Laural Golden.    Prostate Cancer CT abdomen/pelvis on 08/16/2015 with interval development of bilateral pelvic LAD  He underwent a prostatectomy last year for an adenocarcinoma of the prostate. He continues to follow with his urologist.   We discussed his CT scan results today.  He has a PET scan scheduled for 09/06/15 at [facility].  I will call the patient's wife the results as requests. PSA was checked by his urologist on 08/24/2015  and was <0.01 ng/ml. Additional recommendations will be made once PET/CT is complete.   All questions were answered. The patient knows to call the clinic with any problems, questions or concerns. We can certainly see the patient much sooner if necessary.   This document serves as a record of services personally performed by Ancil Linsey, MD. It was created on her behalf by Kandace Blitz, a trained medical scribe. The creation of this record is based on the scribe's personal observations and the provider's statements to them. This document has been checked and approved by the attending provider.  I have reviewed the above documentation for accuracy and completeness, and I agree with the above.  This note was electronically signed.  Kelby Fam. Whitney Muse, MD

## 2015-08-30 NOTE — Patient Instructions (Addendum)
[place] at [facility] Discharge Instructions  RECOMMENDATIONS MADE BY THE CONSULTANT AND ANY TEST RESULTS WILL BE SENT TO YOUR REFERRING PHYSICIAN.   Exam and discussion by Dr Whitney Muse today PET scan scheduled  Return to see the doctor after your PET scan, we will call with your wife with these results   Please call the clinic if you have any questions or concerns    Thank you for choosing [place] at [facility] to provide your oncology and hematology care.  To afford each patient quality time with our provider, please arrive at least 15 minutes before your scheduled appointment time.   Beginning January 23rd 2017 lab work for the [facility] will be done in the  Main lab at [facility] on 1st floor. If you have a lab appointment with the [facility] please come in thru the  Main Entrance and check in at the main information desk  You need to re-schedule your appointment should you arrive 10 or more minutes late.  We strive to give you quality time with our providers, and arriving late affects you and other patients whose appointments are after yours.  Also, if you no show three or more times for appointments you may be dismissed from the clinic at the providers discretion.     Again, thank you for choosing [facility].  Our hope is that these requests will decrease the amount of time that you wait before being seen by our physicians.       _____________________________________________________________  Should you have questions after your visit to [facility], please contact our office at [phone] between the hours of 8:30 a.m. and 4:30 p.m.  Voicemails left after 4:30 p.m. will not be returned until the following business day.  For prescription refill requests, have your pharmacy contact our office.         Resources For Cancer Patients and their Caregivers ? American Cancer Society: Can assist  with transportation, wigs, general needs, runs Look Good Feel Better.        352-589-1524 ? Cancer Care: Provides financial assistance, online support groups, medication/co-pay assistance.  1-800-813-HOPE 337-475-2781) ? [place] Assists [place] cancer patients and their families through emotional , educational and financial support.  919-074-9052 ? [place] DSS Where to apply for food stamps, Medicaid and utility assistance. 985-405-2891 ? [facility]: Transportation to medical appointments. 562-876-4678 ? Social Security Administration: May apply for disability if have a Stage IV cancer. 860-392-6368 682-728-8427 ? [employer], Disability and Transit Services: Assists with nutrition, care and transit needs. ([phone]

## 2015-08-31 MED ORDER — CYANOCOBALAMIN 1000 MCG/ML IJ SOLN
INTRAMUSCULAR | Status: AC
  Filled 2015-08-31: qty 1

## 2015-09-06 ENCOUNTER — Ambulatory Visit
Admission: RE | Admit: 2015-09-06 | Discharge: 2015-09-06 | Disposition: A | Discharge status: Home / Self Care | Payer: Medicare Other | Source: Ambulatory Visit | Attending: Oncology | Admitting: Oncology

## 2015-09-06 DIAGNOSIS — C61 Malignant neoplasm of prostate: Secondary | ICD-10-CM | POA: Diagnosis not present

## 2015-09-06 DIAGNOSIS — Z85038 Personal history of other malignant neoplasm of large intestine: Secondary | ICD-10-CM | POA: Insufficient documentation

## 2015-09-06 DIAGNOSIS — Z9079 Acquired absence of other genital organ(s): Secondary | ICD-10-CM | POA: Insufficient documentation

## 2015-09-06 DIAGNOSIS — R591 Generalized enlarged lymph nodes: Secondary | ICD-10-CM

## 2015-09-06 DIAGNOSIS — R590 Localized enlarged lymph nodes: Secondary | ICD-10-CM | POA: Insufficient documentation

## 2015-09-06 DIAGNOSIS — Z8546 Personal history of malignant neoplasm of prostate: Secondary | ICD-10-CM | POA: Diagnosis not present

## 2015-09-06 LAB — GLUCOSE, CAPILLARY: GLUCOSE-CAPILLARY: 93 mg/dL (ref 65–99)

## 2015-09-06 MED ORDER — FLUDEOXYGLUCOSE F - 18 (FDG) INJECTION
12.1700 | Freq: Once | INTRAVENOUS | Status: AC | PRN
  Administered 2015-09-06: 12.17 via INTRAVENOUS

## 2015-09-07 ENCOUNTER — Telehealth (HOSPITAL_COMMUNITY): Payer: Self-pay | Admitting: Hematology & Oncology

## 2015-09-07 ENCOUNTER — Other Ambulatory Visit (HOSPITAL_COMMUNITY): Payer: Self-pay | Admitting: Hematology & Oncology

## 2015-09-07 DIAGNOSIS — C189 Malignant neoplasm of colon, unspecified: Secondary | ICD-10-CM

## 2015-09-07 DIAGNOSIS — R590 Localized enlarged lymph nodes: Secondary | ICD-10-CM

## 2015-09-07 DIAGNOSIS — C61 Malignant neoplasm of prostate: Secondary | ICD-10-CM

## 2015-09-07 NOTE — Telephone Encounter (Signed)
Called and spoke with patient's wife as he requested.  Reviewed results in detail.  Will repeat CT pelvis in 3 months as recommended. PSA was < 0.01.  Donald Pore MD

## 2015-09-10 ENCOUNTER — Encounter (HOSPITAL_COMMUNITY): Payer: Medicare Other

## 2015-09-11 ENCOUNTER — Encounter (INDEPENDENT_AMBULATORY_CARE_PROVIDER_SITE_OTHER): Payer: Self-pay | Admitting: *Deleted

## 2015-09-18 ENCOUNTER — Ambulatory Visit (HOSPITAL_COMMUNITY): Payer: Medicare Other | Admitting: Hematology & Oncology

## 2015-09-27 ENCOUNTER — Other Ambulatory Visit (INDEPENDENT_AMBULATORY_CARE_PROVIDER_SITE_OTHER): Payer: Self-pay | Admitting: *Deleted

## 2015-09-27 DIAGNOSIS — Z86010 Personal history of colonic polyps: Secondary | ICD-10-CM

## 2015-09-27 DIAGNOSIS — Z85038 Personal history of other malignant neoplasm of large intestine: Secondary | ICD-10-CM

## 2015-10-23 DIAGNOSIS — H11153 Pinguecula, bilateral: Secondary | ICD-10-CM | POA: Diagnosis not present

## 2015-11-08 ENCOUNTER — Other Ambulatory Visit (INDEPENDENT_AMBULATORY_CARE_PROVIDER_SITE_OTHER): Payer: Self-pay | Admitting: *Deleted

## 2015-11-08 ENCOUNTER — Encounter (INDEPENDENT_AMBULATORY_CARE_PROVIDER_SITE_OTHER): Payer: Self-pay | Admitting: *Deleted

## 2015-11-08 ENCOUNTER — Telehealth (INDEPENDENT_AMBULATORY_CARE_PROVIDER_SITE_OTHER): Payer: Self-pay | Admitting: *Deleted

## 2015-11-08 MED ORDER — PEG 3350-KCL-NA BICARB-NACL 420 G PO SOLR: 4000.0000 mL | Freq: Once | ORAL | Status: DC

## 2015-11-08 NOTE — Telephone Encounter (Signed)
Patient needs trilyte 

## 2015-11-08 NOTE — Telephone Encounter (Signed)
Referring MD/PCP: hall   Procedure: tcs  Reason/Indication:  Hx colon ca, hx polyps  Has patient had this procedure before?  Yes, 2014 -- epic  If so, when, by whom and where?    Is there a family history of colon cancer?  no  Who?  What age when diagnosed?    Is patient diabetic?   no      Does patient have prosthetic heart valve or mechanical valve?  no  Do you have a pacemaker?  no  Has patient ever had endocarditis? no  Has patient had joint replacement within last 12 months?  no  Does patient tend to be constipated or take laxatives? no  Does patient have a history of alcohol/drug use?  no  Is patient on Coumadin, Plavix and/or Aspirin? no  Medications: fish oil, omega men's over 50, protonix, probiotic  Allergies: nkda  Medication Adjustment:   Procedure date & time: 12/05/15 at 1200

## 2015-11-08 NOTE — Telephone Encounter (Signed)
agree

## 2015-11-17 ENCOUNTER — Other Ambulatory Visit (INDEPENDENT_AMBULATORY_CARE_PROVIDER_SITE_OTHER): Payer: Self-pay | Admitting: Internal Medicine

## 2015-12-05 ENCOUNTER — Encounter (HOSPITAL_COMMUNITY)
Admission: RE | Disposition: A | Discharge status: Home / Self Care | Payer: Self-pay | Source: Ambulatory Visit | Attending: Internal Medicine

## 2015-12-05 ENCOUNTER — Encounter (HOSPITAL_COMMUNITY): Payer: Self-pay | Admitting: *Deleted

## 2015-12-05 ENCOUNTER — Ambulatory Visit (HOSPITAL_COMMUNITY)
Admission: RE | Admit: 2015-12-05 | Discharge: 2015-12-05 | Disposition: A | Discharge status: Home / Self Care | Payer: Medicare Other | Source: Ambulatory Visit | Attending: Internal Medicine | Admitting: Internal Medicine

## 2015-12-05 DIAGNOSIS — K644 Residual hemorrhoidal skin tags: Secondary | ICD-10-CM | POA: Insufficient documentation

## 2015-12-05 DIAGNOSIS — K621 Rectal polyp: Secondary | ICD-10-CM | POA: Insufficient documentation

## 2015-12-05 DIAGNOSIS — D123 Benign neoplasm of transverse colon: Secondary | ICD-10-CM | POA: Diagnosis not present

## 2015-12-05 DIAGNOSIS — Z87891 Personal history of nicotine dependence: Secondary | ICD-10-CM | POA: Diagnosis not present

## 2015-12-05 DIAGNOSIS — Z85038 Personal history of other malignant neoplasm of large intestine: Secondary | ICD-10-CM | POA: Insufficient documentation

## 2015-12-05 DIAGNOSIS — D125 Benign neoplasm of sigmoid colon: Secondary | ICD-10-CM | POA: Insufficient documentation

## 2015-12-05 DIAGNOSIS — K5730 Diverticulosis of large intestine without perforation or abscess without bleeding: Secondary | ICD-10-CM | POA: Diagnosis not present

## 2015-12-05 DIAGNOSIS — Z08 Encounter for follow-up examination after completed treatment for malignant neoplasm: Secondary | ICD-10-CM | POA: Diagnosis not present

## 2015-12-05 DIAGNOSIS — K219 Gastro-esophageal reflux disease without esophagitis: Secondary | ICD-10-CM | POA: Diagnosis present

## 2015-12-05 DIAGNOSIS — Z1211 Encounter for screening for malignant neoplasm of colon: Secondary | ICD-10-CM | POA: Diagnosis not present

## 2015-12-05 DIAGNOSIS — N400 Enlarged prostate without lower urinary tract symptoms: Secondary | ICD-10-CM | POA: Insufficient documentation

## 2015-12-05 DIAGNOSIS — Z86010 Personal history of colonic polyps: Secondary | ICD-10-CM | POA: Diagnosis not present

## 2015-12-05 HISTORY — PX: COLONOSCOPY: SHX5424

## 2015-12-05 SURGERY — COLONOSCOPY
Anesthesia: Moderate Sedation

## 2015-12-05 MED ORDER — STERILE WATER FOR IRRIGATION IR SOLN
Status: DC | PRN
  Administered 2015-12-05: 4 mL

## 2015-12-05 MED ORDER — MIDAZOLAM HCL 5 MG/5ML IJ SOLN
INTRAMUSCULAR | Status: AC
  Filled 2015-12-05: qty 10

## 2015-12-05 MED ORDER — MEPERIDINE HCL 50 MG/ML IJ SOLN
INTRAMUSCULAR | Status: AC
  Filled 2015-12-05: qty 1

## 2015-12-05 MED ORDER — SODIUM CHLORIDE 0.9 % IV SOLN
INTRAVENOUS | Status: DC
  Administered 2015-12-05: 11:00:00 via INTRAVENOUS

## 2015-12-05 MED ORDER — MIDAZOLAM HCL 5 MG/5ML IJ SOLN
INTRAMUSCULAR | Status: DC | PRN
  Administered 2015-12-05: 2 mg via INTRAVENOUS
  Administered 2015-12-05: 1 mg via INTRAVENOUS
  Administered 2015-12-05 (×2): 2 mg via INTRAVENOUS

## 2015-12-05 MED ORDER — MEPERIDINE HCL 50 MG/ML IJ SOLN
INTRAMUSCULAR | Status: DC | PRN
  Administered 2015-12-05 (×2): 25 mg via INTRAVENOUS

## 2015-12-05 NOTE — H&P (Signed)
William Burns is an 70 y.o. male.   Chief Complaint: Patient is here for colonoscopy. HPI: Patient is 71 year old Caucasian male who was history of colon carcinoma status post right hemicolectomy in February 2013 was here for surveillance colonoscopy. Last exam was in April 2014 with removal of single polyp which turned out to be serrated polyp. He denies abdominal pain or rectal bleeding. Family History is negative for CRC.  Past Medical History  Diagnosis Date  . BPH (benign prostatic hyperplasia)   . Kidney stones 2011  . Tooth infection 07/03/2011  . Colonic mass 07/03/2011  . Erosive gastritis 07/03/2011  . Renal calculus   . Cataract   . Cancer ([place])     colon  . Difficult intubation     extra long tube for intub per wife and slow to wake up   . GERD (gastroesophageal reflux disease)     Past Surgical History  Procedure Laterality Date  . Prostate biopsy    . Esophagogastroduodenoscopy  07/03/2011    Procedure: ESOPHAGOGASTRODUODENOSCOPY (EGD);  Surgeon: Rogene Houston, MD;  [facility];  Service: Endoscopy;  Laterality: N/A;  . Colonoscopy  07/03/2011    Procedure: COLONOSCOPY;  Surgeon: Rogene Houston, MD;  [facility];  Service: Endoscopy;  Laterality: N/A;  . Partial colectomy  07/04/2011    Procedure: PARTIAL COLECTOMY;  Surgeon: Jamesetta So, MD;  Location: [facility];  Service: General;  Laterality: N/A;  . Cystoscopy  07/09/2011    Procedure: Erlene Quan;  Surgeon: Marissa Nestle, MD;  Location: [facility];  Service: Urology;  Laterality: N/A;  . Colon surgery    . Transurethral resection of prostate  07/11/2011    Procedure: TRANSURETHRAL RESECTION OF THE PROSTATE (TURP);  Surgeon: Marissa Nestle, MD;  Location: [facility];  Service: Urology;  Laterality: N/A;  . Cystoscopy with litholapaxy  07/11/2011    Procedure: CYSTOSCOPY WITH LITHOLAPAXY;  Surgeon: Marissa Nestle, MD;  Location: [facility];  Service: Urology;  Laterality: N/A;  . Cataract  extraction w/phaco Left 08/23/2012    Procedure: CATARACT EXTRACTION PHACO AND INTRAOCULAR LENS PLACEMENT (IOC);  Surgeon: Tonny Branch, MD;  Location: [facility];  Service: Ophthalmology;  Laterality: Left;  CDE 16.64  . Colonoscopy N/A 09/16/2012    Procedure: COLONOSCOPY;  Surgeon: Rogene Houston, MD;  [facility];  Service: Endoscopy;  Laterality: N/A;  930  . Cataract extraction w/phaco Right 06/09/2013    Procedure: CATARACT EXTRACTION PHACO AND INTRAOCULAR LENS PLACEMENT (IOC) CDE=10.86;  Surgeon: Tonny Branch, MD;  Location: [facility];  Service: Ophthalmology;  Laterality: Right;  . Prostage cancer    . Hernia repair  [id number]     umbilical   . Robot assisted laparoscopic radical prostatectomy Bilateral 01/19/2015    Procedure: ROBOTIC ASSISTED LAPAROSCOPIC RADICAL PROSTATECTOMY AND BILATERAL PELVIC LYMPH NODE DISSECTION, LAPARASCOPIC  LYSIS OF ADHESIONS;  Surgeon: Ardis Hughs, MD;  Location: [facility];  Service: Urology;  Laterality: Bilateral;    Family History  Problem Relation Age of Onset  . Alzheimer's disease Mother   . Alzheimer's disease Father   . Aneurysm Brother     brain  . Anuerysm Brother   . Colon cancer Neg Hx   . Healthy Son   . Healthy Son    Social History:  reports that he quit smoking about 26 years ago. His smoking use included Cigarettes. He has a 50 pack-year smoking history. He has never used smokeless tobacco. He  reports that he does not drink alcohol or use illicit drugs.  Allergies: No Known Allergies  Medications Prior to Admission  Medication Sig Dispense Refill  . Fish Oil OIL by Does not apply route.    . Multiple Vitamins-Minerals (MULTIVITAMIN MEN PO) Take by mouth.    . pantoprazole (PROTONIX) 40 MG tablet TAKE 1 TABLET BY MOUTH ONCE DAILY. 90 tablet 3  . polyethylene glycol-electrolytes (TRILYTE) 420 g solution Take 4,000 mLs by mouth once. 4000 mL 0  . Polyvinyl Alcohol-Povidone (REFRESH OP) Apply 1-2 drops to eye daily as needed (dust in  eyes.). Reported on 08/30/2015    . Probiotic CAPS Take 1 capsule by mouth every morning. Taking PB8      No results found for this or any previous visit (from the past 48 hour(s)). No results found.  ROS  Blood pressure 146/73, pulse 64, temperature 98.1 F (36.7 C), temperature source Oral, resp. rate 19, height 6\' 2"  (1.88 m), weight 243 lb (110.224 kg), SpO2 97 %. Physical Exam  Constitutional: He appears well-developed and well-nourished.  HENT:  Mouth/Throat: Oropharynx is clear and moist.  Eyes: Conjunctivae are normal. No scleral icterus.  Neck: No thyromegaly present.  Cardiovascular: Normal rate and regular rhythm.   Murmur: faint systolic ejection murmur best heard at left sternal border. Respiratory: Effort normal and breath sounds normal.  GI:  Abdomen is symmetrical and soft with the right paramedian scar. No organomegaly or masses noted.  Musculoskeletal: He exhibits no edema.  Lymphadenopathy:    He has no cervical adenopathy.  Neurological: He is alert.  Skin: Skin is warm and dry.     Assessment/Plan History of colon carcinoma and sessile serrated polyp. Surveillance colonoscopy.  Hildred Laser, MD 12/05/2015, 12:02 PM

## 2015-12-05 NOTE — Op Note (Signed)
[facility] Patient Name: William Burns Procedure Date: 12/05/2015 11:59 AM MRN: [id number] Date of Birth: [birth year] Attending MD: Hildred Laser , MD CSN: [id number] Age: 70 Admit Type: Outpatient Procedure:                Colonoscopy Indications:              High risk colon cancer surveillance: Personal                            history of colonic polyps. Personal history of                            colon cancer Providers:                Hildred Laser, MD, Gwynneth Albright RN, RN,                            Bonnetta Barry, Technician Referring MD:             [name], MD Medicines:                Meperidine 50 mg IV, Midazolam 7 mg IV Complications:            No immediate complications. Estimated Blood Loss:     Estimated blood loss was minimal. Procedure:                Pre-Anesthesia Assessment:                           - Prior to the procedure, a History and Physical                            was performed, and patient medications and                            allergies were reviewed. The patient's tolerance of                            previous anesthesia was also reviewed. The risks                            and benefits of the procedure and the sedation                            options and risks were discussed with the patient.                            All questions were answered, and informed consent                            was obtained. Prior Anticoagulants: The patient has                            taken no previous anticoagulant or antiplatelet  agents. ASA Grade Assessment: I - A normal, healthy                            patient. After reviewing the risks and benefits,                            the patient was deemed in satisfactory condition to                            undergo the procedure.                           After obtaining informed consent, the colonoscope                            was passed under direct  vision. Throughout the                            procedure, the patient's blood pressure, pulse, and                            oxygen saturations were monitored continuously. The                            [id number]) was introduced through the                            anus and advanced to the the terminal ileum. The                            colonoscopy was performed without difficulty. The                            patient tolerated the procedure well. The quality                            of the bowel preparation was good. The terminal                            ileum and the rectum were photographed. Scope In: 12:15:00 PM Scope Out: 12:50:31 PM Scope Withdrawal Time: 0 hours 21 minutes 39 seconds  Total Procedure Duration: 0 hours 35 minutes 31 seconds  Findings:      The neo-terminal ileum appeared normal.      A 5 mm polyp was found in the transverse colon. The polyp was sessile.       The polyp was removed with a cold snare. Resection and retrieval were       complete.      Three sessile polyps were found in the rectum and sigmoid colon. The       polyps were small in size. These polyps were removed with a cold snare.       Resection and retrieval were complete.      Two small-mouthed diverticula were found in the sigmoid colon.      External hemorrhoids were found during  retroflexion. The hemorrhoids       were small. Impression:               - The examined portion of the ileum was normal.                           - One 5 mm polyp in the transverse colon, removed                            with a cold snare. Resected and retrieved.                           - Three small polyps in the rectum and in the                            sigmoid colon, removed with a cold snare. Resected                            and retrieved.                           - Diverticulosis in the sigmoid colon.                           - External hemorrhoids. Moderate Sedation:       Moderate (conscious) sedation was administered by the endoscopy nurse       and supervised by the endoscopist. The following parameters were       monitored: oxygen saturation, heart rate, blood pressure, CO2       capnography and response to care. Total physician intraservice time was       43 minutes. Recommendation:           - Patient has a contact number available for                            emergencies. The signs and symptoms of potential                            delayed complications were discussed with the                            patient. Return to normal activities tomorrow.                            Written discharge instructions were provided to the                            patient.                           - Resume previous diet today.                           - Continue present medications.                           -  Await pathology results.                           - Repeat colonoscopy in 5 years for surveillance. Procedure Code(s):        --- Professional ---                           [phone], Colonoscopy, flexible; with removal of                            tumor(s), polyp(s), or other lesion(s) by snare                            technique                           99152, Moderate sedation services provided by the                            same physician or other qualified health care                            professional performing the diagnostic or                            therapeutic service that the sedation supports,                            requiring the presence of an independent trained                            observer to assist in the monitoring of the                            patient's level of consciousness and physiological                            status; initial 15 minutes of intraservice time,                            patient age 56 years or older                           [phone], Moderate sedation services; each additional                             15 minutes intraservice time                           99153, Moderate sedation services; each additional                            15 minutes intraservice time Diagnosis Code(s):        --- Professional ---  Z86.010, Personal history of colonic polyps                           Z85.038, Personal history of other malignant                            neoplasm of large intestine                           K64.4, Residual hemorrhoidal skin tags                           D12.3, Benign neoplasm of transverse colon (hepatic                            flexure or splenic flexure)                           K62.1, Rectal polyp                           D12.5, Benign neoplasm of sigmoid colon                           K57.30, Diverticulosis of large intestine without                            perforation or abscess without bleeding CPT copyright 2016 American Medical Association. All rights reserved. The codes documented in this report are preliminary and upon coder review may  be revised to meet current compliance requirements. Hildred Laser, MD Hildred Laser, MD 12/05/2015 12:59:46 PM This report has been signed electronically. Number of Addenda: 0

## 2015-12-05 NOTE — Discharge Instructions (Signed)
Resume usual medications and diet. No driving for 24 hours. Physician will call with biopsy results.  Colonoscopy, Care After Refer to this sheet in the next few weeks. These instructions provide you with information on caring for yourself after your procedure. Your health care provider may also give you more specific instructions. Your treatment has been planned according to current medical practices, but problems sometimes occur. Call your health care provider if you have any problems or questions after your procedure. WHAT TO EXPECT AFTER THE PROCEDURE  After your procedure, it is typical to have the following:  A small amount of blood in your stool.  Moderate amounts of gas and mild abdominal cramping or bloating. HOME CARE INSTRUCTIONS  Do not drive, operate machinery, or sign important documents for 24 hours.  You may shower and resume your regular physical activities, but move at a slower pace for the first 24 hours.  Take frequent rest periods for the first 24 hours.  Walk around or put a warm pack on your abdomen to help reduce abdominal cramping and bloating.  Drink enough fluids to keep your urine clear or pale yellow.  You may resume your normal diet as instructed by your health care provider. Avoid heavy or fried foods that are hard to digest.  Avoid drinking alcohol for 24 hours or as instructed by your health care provider.  Only take over-the-counter or prescription medicines as directed by your health care provider.  If a tissue sample (biopsy) was taken during your procedure:  Do not take aspirin or blood thinners for 7 days, or as instructed by your health care provider.  Do not drink alcohol for 7 days, or as instructed by your health care provider.  Eat soft foods for the first 24 hours. SEEK MEDICAL CARE IF: You have persistent spotting of blood in your stool 2-3 days after the procedure. SEEK IMMEDIATE MEDICAL CARE IF:  You have more than a small  spotting of blood in your stool.  You pass large blood clots in your stool.  Your abdomen is swollen (distended).  You have nausea or vomiting.  You have a fever.  You have increasing abdominal pain that is not relieved with medicine.   This information is not intended to replace advice given to you by your health care provider. Make sure you discuss any questions you have with your health care provider.   Document Released: 01/01/2004 Document Revised: 03/09/2013 Document Reviewed: 01/24/2013 Elsevier Interactive Patient Education 2016 Else[employer] Inc[employer]   Colon Polyps Polyps are lumps of extra tissue growing inside the body. Polyps can grow in the large intestine (colon). Most colon polyps are noncancerous (benign). However, some colon polyps can become cancerous over time. Polyps that are larger than a pea may be harmful. To be safe, caregivers remove and test all polyps. CAUSES  Polyps form when mutations in the genes cause your cells to grow and divide even though no more tissue is needed. RISK FACTORS There are a number of risk factors that can increase your chances of getting colon polyps. They include:  Being older than 50 years.  Family history of colon polyps or colon cancer.  Long-term colon diseases, such as colitis or Crohn disease.  Being overweight.  Smoking.  Being inactive.  Drinking too much alcohol. SYMPTOMS  Most small polyps do not cause symptoms. If symptoms are present, they may include:  Blood in the stool. The stool may look dark red or black.  Constipation or diarrhea that lasts  longer than 1 week. DIAGNOSIS People often do not know they have polyps until their caregiver finds them during a regular checkup. Your caregiver can use 4 tests to check for polyps:  Digital rectal exam. The caregiver wears gloves and feels inside the rectum. This test would find polyps only in the rectum.  Barium enema. The caregiver puts a liquid called barium into  your rectum before taking X-rays of your colon. Barium makes your colon look white. Polyps are dark, so they are easy to see in the X-ray pictures.  Sigmoidoscopy. A thin, flexible tube (sigmoidoscope) is placed into your rectum. The sigmoidoscope has a light and tiny camera in it. The caregiver uses the sigmoidoscope to look at the last third of your colon.  Colonoscopy. This test is like sigmoidoscopy, but the caregiver looks at the entire colon. This is the most common method for finding and removing polyps. TREATMENT  Any polyps will be removed during a sigmoidoscopy or colonoscopy. The polyps are then tested for cancer. PREVENTION  To help lower your risk of getting more colon polyps:  Eat plenty of fruits and vegetables. Avoid eating fatty foods.  Do not smoke.  Avoid drinking alcohol.  Exercise every day.  Lose weight if recommended by your caregiver.  Eat plenty of calcium and folate. Foods that are rich in calcium include milk, cheese, and broccoli. Foods that are rich in folate include chickpeas, kidney beans, and spinach. HOME CARE INSTRUCTIONS Keep all follow-up appointments as directed by your caregiver. You may need periodic exams to check for polyps. SEEK MEDICAL CARE IF: You notice bleeding during a bowel movement.   This information is not intended to replace advice given to you by your health care provider. Make sure you discuss any questions you have with your health care provider.   Document Released: 02/13/2004 Document Revised: 06/09/2014 Document Reviewed: 07/29/2011 Elsevier Interactive Patient Education 2016 [employer].   Hemorrhoids Hemorrhoids are swollen veins around the rectum or anus. There are two types of hemorrhoids:   Internal hemorrhoids. These occur in the veins just inside the rectum. They may poke through to the outside and become irritated and painful.  External hemorrhoids. These occur in the veins outside the anus and can be felt as a  painful swelling or hard lump near the anus. CAUSES  Pregnancy.   Obesity.   Constipation or diarrhea.   Straining to have a bowel movement.   Sitting for long periods on the toilet.  Heavy lifting or other activity that caused you to strain.  Anal intercourse. SYMPTOMS   Pain.   Anal itching or irritation.   Rectal bleeding.   Fecal leakage.   Anal swelling.   One or more lumps around the anus.  DIAGNOSIS  Your caregiver may be able to diagnose hemorrhoids by visual examination. Other examinations or tests that may be performed include:   Examination of the rectal area with a gloved hand (digital rectal exam).   Examination of anal canal using a small tube (scope).   A blood test if you have lost a significant amount of blood.  A test to look inside the colon (sigmoidoscopy or colonoscopy). TREATMENT Most hemorrhoids can be treated at home. However, if symptoms do not seem to be getting better or if you have a lot of rectal bleeding, your caregiver may perform a procedure to help make the hemorrhoids get smaller or remove them completely. Possible treatments include:   Placing a rubber band at the base of  the hemorrhoid to cut off the circulation (rubber band ligation).   Injecting a chemical to shrink the hemorrhoid (sclerotherapy).   Using a tool to burn the hemorrhoid (infrared light therapy).   Surgically removing the hemorrhoid (hemorrhoidectomy).   Stapling the hemorrhoid to block blood flow to the tissue (hemorrhoid stapling).  HOME CARE INSTRUCTIONS   Eat foods with fiber, such as whole grains, beans, nuts, fruits, and vegetables. Ask your doctor about taking products with added fiber in them (fibersupplements).  Increase fluid intake. Drink enough water and fluids to keep your urine clear or pale yellow.   Exercise regularly.   Go to the bathroom when you have the urge to have a bowel movement. Do not wait.   Avoid straining  to have bowel movements.   Keep the anal area dry and clean. Use wet toilet paper or moist towelettes after a bowel movement.   Medicated creams and suppositories may be used or applied as directed.   Only take over-the-counter or prescription medicines as directed by your caregiver.   Take warm sitz baths for 15-20 minutes, 3-4 times a day to ease pain and discomfort.   Place ice packs on the hemorrhoids if they are tender and swollen. Using ice packs between sitz baths may be helpful.   Put ice in a plastic bag.   Place a towel between your skin and the bag.   Leave the ice on for 15-20 minutes, 3-4 times a day.   Do not use a donut-shaped pillow or sit on the toilet for long periods. This increases blood pooling and pain.  SEEK MEDICAL CARE IF:  You have increasing pain and swelling that is not controlled by treatment or medicine.  You have uncontrolled bleeding.  You have difficulty or you are unable to have a bowel movement.  You have pain or inflammation outside the area of the hemorrhoids. MAKE SURE YOU:  Understand these instructions.  Will watch your condition.  Will get help right away if you are not doing well or get worse.   This information is not intended to replace advice given to you by your health care provider. Make sure you discuss any questions you have with your health care provider.   Document Released: 05/16/2000 Document Revised: 05/05/2012 Document Reviewed: 03/23/2012 Elsevier Interactive Patient Education [employer].

## 2015-12-07 ENCOUNTER — Encounter (HOSPITAL_COMMUNITY): Payer: Self-pay | Admitting: *Deleted

## 2015-12-07 ENCOUNTER — Ambulatory Visit (HOSPITAL_COMMUNITY)
Admission: RE | Admit: 2015-12-07 | Discharge: 2015-12-07 | Disposition: A | Discharge status: Home / Self Care | Payer: Medicare Other | Source: Ambulatory Visit | Attending: Hematology & Oncology | Admitting: Hematology & Oncology

## 2015-12-07 DIAGNOSIS — C189 Malignant neoplasm of colon, unspecified: Secondary | ICD-10-CM

## 2015-12-07 DIAGNOSIS — M899 Disorder of bone, unspecified: Secondary | ICD-10-CM | POA: Insufficient documentation

## 2015-12-07 DIAGNOSIS — R590 Localized enlarged lymph nodes: Secondary | ICD-10-CM | POA: Insufficient documentation

## 2015-12-07 DIAGNOSIS — C61 Malignant neoplasm of prostate: Secondary | ICD-10-CM | POA: Diagnosis not present

## 2015-12-07 LAB — POCT I-STAT CREATININE: Creatinine, Ser: 1 mg/dL (ref 0.61–1.24)

## 2015-12-07 MED ORDER — IOPAMIDOL (ISOVUE-300) INJECTION 61%
100.0000 mL | Freq: Once | INTRAVENOUS | Status: AC | PRN
Start: 2015-12-07 — End: 2015-12-07
  Administered 2015-12-07: 100 mL via INTRAVENOUS

## 2015-12-10 ENCOUNTER — Encounter (HOSPITAL_COMMUNITY): Payer: Self-pay | Admitting: Hematology & Oncology

## 2015-12-10 ENCOUNTER — Encounter (HOSPITAL_COMMUNITY): Payer: Medicare Other | Attending: Hematology & Oncology | Admitting: Hematology & Oncology

## 2015-12-10 VITALS — BP 131/70 | HR 75 | Temp 98.5°F | Resp 18 | Wt 254.7 lb

## 2015-12-10 DIAGNOSIS — C189 Malignant neoplasm of colon, unspecified: Secondary | ICD-10-CM

## 2015-12-10 DIAGNOSIS — C180 Malignant neoplasm of cecum: Secondary | ICD-10-CM | POA: Diagnosis not present

## 2015-12-10 DIAGNOSIS — C61 Malignant neoplasm of prostate: Secondary | ICD-10-CM | POA: Diagnosis not present

## 2015-12-10 NOTE — Progress Notes (Signed)
Wende Neighbors, MD  [place] [address]   DIAGNOSIS:  Stage II-A carcinoma of the cecum R hemicolectomy on 07/07/2011, no additional therapy Colonoscopy on 07/2012 with sessile adenoma Adenocarcinoma of prostate s/p protatectomy on 01/19/2015, Gleason score 4 + 3 = 7, pT2c, pN0 stage   CURRENT THERAPY: Observation  INTERVAL HISTORY: William Burns 70 y.o. male returns for follow-up of a stage IIa carcinoma of the cecum. He underwent a right hemicolectomy in February 2013. He has had problems with kidney stones and follows with a urologist.   The patient had a Robotic Assisted Prostactecomy completed on 01/19/15 for prostate cancer.     William Burns is accompanied by his wife today. I personally reviewed and went over CT results with the patient.  He has been feeling and eating well. He has been staying active. He denies any pain. He has no concerns or complaints at this time.   His wife notes a skin lesion on his abdomen that has been there for one month and a half. They used over the counter anti-fungal ring worm medication on it but it has not improved. They plan on making an appointment with dermatology soon.  He had his colonoscopy with Dr. Laural Golden.   He has his cholesterol checked with PCP Dr. Nevada Crane.   He plans on attending a tractor pull to celebrate his upcoming birthday. He notes he otherwise feels well. Appetite and energy are good.  MEDICAL HISTORY: Past Medical History  Diagnosis Date  . BPH (benign prostatic hyperplasia)   . Kidney stones 2011  . Tooth infection 07/03/2011  . Colonic mass 07/03/2011  . Erosive gastritis 07/03/2011  . Renal calculus   . Cataract   . Cancer ([place])     colon  . Difficult intubation     extra long tube for intub per wife and slow to wake up   . GERD (gastroesophageal reflux disease)     has Microcytic hypochromic anemia; Hx of colonic polyp; Hyperglycemia; BPH (benign prostatic hyperplasia); Tooth infection;  Adenocarcinoma of colon ([place]); Erosive gastritis; Calculus, bladder; Short-segment Barrett's esophagus; Diarrhea; Kidney stone on left side; Prostate cancer ([place]); Transient global amnesia; GERD (gastroesophageal reflux disease); and TIA (transient ischemic attack) on his problem list.     has No Known Allergies.  William Burns does not currently have medications on file.  SURGICAL HISTORY: Past Surgical History  Procedure Laterality Date  . Prostate biopsy    . Esophagogastroduodenoscopy  07/03/2011    Procedure: ESOPHAGOGASTRODUODENOSCOPY (EGD);  Surgeon: Rogene Houston, MD;  [facility];  Service: Endoscopy;  Laterality: N/A;  . Colonoscopy  07/03/2011    Procedure: COLONOSCOPY;  Surgeon: Rogene Houston, MD;  [facility];  Service: Endoscopy;  Laterality: N/A;  . Partial colectomy  07/04/2011    Procedure: PARTIAL COLECTOMY;  Surgeon: Jamesetta So, MD;  Location: [facility];  Service: General;  Laterality: N/A;  . Cystoscopy  07/09/2011    Procedure: Erlene Quan;  Surgeon: Marissa Nestle, MD;  Location: [facility];  Service: Urology;  Laterality: N/A;  . Colon surgery    . Transurethral resection of prostate  07/11/2011    Procedure: TRANSURETHRAL RESECTION OF THE PROSTATE (TURP);  Surgeon: Marissa Nestle, MD;  Location: [facility];  Service: Urology;  Laterality: N/A;  . Cystoscopy with litholapaxy  07/11/2011    Procedure: CYSTOSCOPY WITH LITHOLAPAXY;  Surgeon: Marissa Nestle, MD;  Location: [facility];  Service: Urology;  Laterality: N/A;  . Cataract extraction w/phaco Left 08/23/2012    Procedure: CATARACT EXTRACTION PHACO AND INTRAOCULAR LENS PLACEMENT (IOC);  Surgeon: Tonny Branch, MD;  Location: [facility];  Service: Ophthalmology;  Laterality: Left;  CDE 16.64  . Colonoscopy N/A 09/16/2012    Procedure: COLONOSCOPY;  Surgeon: Rogene Houston, MD;  [facility];  Service: Endoscopy;  Laterality: N/A;  930  . Cataract extraction w/phaco Right 06/09/2013     Procedure: CATARACT EXTRACTION PHACO AND INTRAOCULAR LENS PLACEMENT (IOC) CDE=10.86;  Surgeon: Tonny Branch, MD;  Location: [facility];  Service: Ophthalmology;  Laterality: Right;  . Prostage cancer    . Hernia repair  [id number]     umbilical   . Robot assisted laparoscopic radical prostatectomy Bilateral 01/19/2015    Procedure: ROBOTIC ASSISTED LAPAROSCOPIC RADICAL PROSTATECTOMY AND BILATERAL PELVIC LYMPH NODE DISSECTION, LAPARASCOPIC  LYSIS OF ADHESIONS;  Surgeon: Ardis Hughs, MD;  Location: [facility];  Service: Urology;  Laterality: Bilateral;    SOCIAL HISTORY: Social History   Social History  . Marital Status: Married    Spouse Name: N/A  . Number of Children: N/A  . Years of Education: N/A   Occupational History  . farmer     tobacco; cattle; Sales executive   Social History Main Topics  . Smoking status: Former Smoker -- 2.00 packs/day for 25 years    Types: Cigarettes    Quit date: 06/02/1989  . Smokeless tobacco: Never Used  . Alcohol Use: No  . Drug Use: No  . Sexual Activity: Not Currently   Other Topics Concern  . Not on file   Social History Narrative    FAMILY HISTORY: Family History  Problem Relation Age of Onset  . Alzheimer's disease Mother   . Alzheimer's disease Father   . Aneurysm Brother     brain  . Anuerysm Brother   . Colon cancer Neg Hx   . Healthy Son   . Healthy Son     Review of Systems  Constitutional: Negative for fever, chills, weight loss and malaise/fatigue.  HENT: Negative for congestion, hearing loss, nosebleeds, sore throat and tinnitus.   Eyes: Negative for blurred vision, double vision, pain and discharge.  Respiratory: Negative for cough, hemoptysis, sputum production, shortness of breath and wheezing.   Cardiovascular: Negative for chest pain, palpitations, claudication and PND.  Gastrointestinal: Negative for heartburn, nausea, vomiting, diarrhea, constipation, blood in stool and melena.  Genitourinary: Positive for urinary leakage.  Negative for dysuria, urgency, frequency and hematuria.  Musculoskeletal: Negative for myalgias, joint pain and falls.  Skin: Negative for itching and rash.  Neurological: Negative for dizziness, tingling, tremors, sensory change, speech change, focal weakness, seizures, loss of consciousness, weakness and headaches.  Endo/Heme/Allergies: Does not bruise/bleed easily.  Psychiatric/Behavioral: Negative for depression, suicidal ideas, memory loss and substance abuse. The patient is not nervous/anxious and does not have insomnia.   14 point review of systems was performed and is negative except as detailed under history of present illness and above   PHYSICAL EXAMINATION  ECOG PERFORMANCE STATUS: 0 - Asymptomatic  Filed Vitals:   12/10/15 1053  BP: 131/70  Pulse: 75  Temp: 98.5 F (36.9 C)  Resp: 18    Physical Exam  Constitutional: He is oriented to person, place, and time and well-developed, well-nourished, and in no distress.  HENT:  Head: Normocephalic and atraumatic.  Nose: Nose normal.  Mouth/Throat: Oropharynx is clear and moist. No oropharyngeal exudate.  Eyes: Conjunctivae and EOM are  normal. Pupils are equal, round, and reactive to light. Right eye exhibits no discharge. Left eye exhibits no discharge. No scleral icterus.  Neck: Normal range of motion. Neck supple. No tracheal deviation present. No thyromegaly present.  Cardiovascular: Normal rate, regular rhythm and normal heart sounds.  Exam reveals no gallop and no friction rub.   No murmur heard. Pulmonary/Chest: Effort normal and breath sounds normal. He has no wheezes. He has no rales.  Abdominal: Soft. Bowel sounds are normal. He exhibits no distension and no mass. There is no tenderness. There is no rebound and no guarding.  Musculoskeletal: Normal range of motion. He exhibits no edema.  Lymphadenopathy:    He has no cervical adenopathy.  Neurological: He is alert and oriented to person, place, and time. He has  normal reflexes. No cranial nerve deficit. Gait normal. Coordination normal.  Skin: Skin is warm and dry. Erythematous round rash on anterior abdomen approx 2 cm in largest dimension Psychiatric: Mood, memory, affect and judgment normal.  Nursing note and vitals reviewed.  LABORATORY DATA: I have reviewed the data as listed. CBC    Component Value Date/Time   WBC 6.9 08/07/2015 1108   RBC 4.93 08/07/2015 1108   RBC 3.26* 07/01/2011 2229   HGB 14.5 08/07/2015 1108   HCT 43.4 08/07/2015 1108   PLT 259 08/07/2015 1108   MCV 88.0 08/07/2015 1108   MCH 29.4 08/07/2015 1108   MCHC 33.4 08/07/2015 1108   RDW 13.2 08/07/2015 1108   LYMPHSABS 1.9 08/07/2015 1108   MONOABS 0.5 08/07/2015 1108   EOSABS 0.3 08/07/2015 1108   BASOSABS 0.1 08/07/2015 1108   CMP     Component Value Date/Time   NA 139 08/07/2015 1108   K 4.0 08/07/2015 1108   CL 103 08/07/2015 1108   CO2 30 08/07/2015 1108   GLUCOSE 103* 08/07/2015 1108   BUN 15 08/07/2015 1108   CREATININE 1.00 12/07/2015 1015   CALCIUM 9.0 08/07/2015 1108   PROT 7.1 08/07/2015 1108   ALBUMIN 3.9 08/07/2015 1108   AST 35 08/07/2015 1108   ALT 28 08/07/2015 1108   ALKPHOS 64 08/07/2015 1108   BILITOT 0.9 08/07/2015 1108   GFRNONAA >60 08/07/2015 1108   GFRAA >60 08/07/2015 1108     RADIOGRAPHIC STUDIES: I have personally reviewed the radiological images as listed and agreed with the findings in the report. Study Result     CLINICAL DATA: History of prostate cancer and colon cancer with hypermetabolic lymphadenopathy on recent PET-CT.  EXAM: CT PELVIS WITH AND WITHOUT CONTRAST  TECHNIQUE: Multidetector CT imaging of the pelvis was performed following the standard protocol before and following the bolus administration of intravenous contrast.  CONTRAST: 144mL ISOVUE-300 IOPAMIDOL (ISOVUE-300) INJECTION 61%  COMPARISON: PET-CT 09/06/2015 and CT scan 08/16/2015  FINDINGS: Stable surgical changes from a  prostatectomy. The bladder appears normal. No pelvic mass identified. Stable bilateral external iliac cystic structures which are probably small seromas. These were not metabolically active on the recent PET scan and measure water attenuation. There are adjacent small external iliac lymph nodes bilaterally but these are unchanged since the prior examinations. Index node on the left on image 33 measures 9 mm an index node on the right on the same image measures 7 mm. No new nodes. Small scattered inguinal lymph nodes are stable.  The rectum, sigmoid colon and visualized small bowel loops are unremarkable. Stable surgical changes from anterior abdominal wall hernia repair with mesh. No recurrent hernia.  Stable aortic and iliac  artery calcifications. No focal aneurysm.  Stable area of sclerotic change involving the left sacrum. There is also a stable sclerotic nodule in the upper left sacrum on image 14. No change in the left iliac crest lesion. These areas are not positive on the PET scan. There are stable since 2015. This is likely treated metastatic disease.  IMPRESSION: 1. Stable surgical changes from a prostatectomy. No pelvic mass. 2. Stable small bilateral external iliac pelvic lymph nodes and small bilateral probable seromas. 3. Stable sclerotic pelvic bone lesions as discussed above.   Electronically Signed  By: Marijo Sanes M.D.  On: 12/07/2015 12:52    ASSESSMENT and THERAPY PLAN:   Stage IIA Colorectal Cancer, diagnosed 07/2011 R hemicolectomy with Dr. Arnoldo Morale CEA not elevated in the pre-operative setting  70 year old male with a history of stage II colon cancer. Laboratory studies were reviewed with the patient and were excellent. We reviewed current NCCN guidelines: He is up to date with screening colonoscopy, last on 12/05/2015. Next CT abdomen and pelvis will be due in March of next year.   NCCN guidelines for surveillance for Colon cancer are as  follows (1.2017):  B. Stage II, Stage III 1. H+P every 3-6 months x 2 years and then every 6 months for a total of 5 years  2. CEA every 3-6 months x 2 years and then every 6 months for a total of 5 years  3. CT CAP every 6-12 months (category 2B for frequency < 12 months) for a total of 5 years . 4.  Colonoscopy in 1 year except if no preoperative colonoscopy due to obstructing lesion, colonoscopy in 3-6 months.  A. If advanced adenoma, repeat in 1 year B. If no advanced adenoma, repeat in 3 years, then every 5 years 5. PET/CT scan is not recommended.  Prostate Cancer CT abdomen/pelvis on 08/16/2015 with interval development of bilateral pelvic LAD CT pelvis 12/07/2015 stable  He underwent a prostatectomy last year for an adenocarcinoma of the prostate. He continues to follow with his urologist.   We discussed his CT scan results today.  The patient will contact our office if he is unable to make an appointment with dermatology so we can make a referral.  All questions were answered. The patient knows to call the clinic with any problems, questions or concerns. We can certainly see the patient much sooner if necessary.   This document serves as a record of services personally performed by Ancil Linsey, MD. It was created on her behalf by Arlyce Harman, a trained medical scribe. The creation of this record is based on the scribe's personal observations and the provider's statements to them. This document has been checked and approved by the attending provider.  I have reviewed the above documentation for accuracy and completeness, and I agree with the above.  This note was electronically signed.  Kelby Fam. Whitney Muse, MD

## 2015-12-10 NOTE — Patient Instructions (Signed)
[place] at [facility] Discharge Instructions  RECOMMENDATIONS MADE BY THE CONSULTANT AND ANY TEST RESULTS WILL BE SENT TO YOUR REFERRING PHYSICIAN.  You were seen by Dr. Whitney Muse today. Return to Clinic in 6 months with labs. Please call the clinic if you have any concerns.  Thank you for choosing [place] at [facility] to provide your oncology and hematology care.  To afford each patient quality time with our provider, please arrive at least 15 minutes before your scheduled appointment time.   Beginning January 23rd 2017 lab work for the [facility] will be done in the  Main lab at [facility] on 1st floor. If you have a lab appointment with the [place] please come in thru the  Main Entrance and check in at the main information desk  You need to re-schedule your appointment should you arrive 10 or more minutes late.  We strive to give you quality time with our providers, and arriving late affects you and other patients whose appointments are after yours.  Also, if you no show three or more times for appointments you may be dismissed from the clinic at the providers discretion.     Again, thank you for choosing [facility].  Our hope is that these requests will decrease the amount of time that you wait before being seen by our physicians.       _____________________________________________________________  Should you have questions after your [facility], please contact our office at [phone] between the hours of 8:30 a.m. and 4:30 p.m.  Voicemails left after 4:30 p.m. will not be returned until the following business day.  For prescription refill requests, have your pharmacy contact our office.         Resources For Cancer Patients and their Caregivers ? American Cancer Society: Can assist with transportation, wigs, general needs, runs Look Good Feel Better.         7258545473 ? Cancer Care: Provides financial assistance, online support groups, medication/co-pay assistance.  1-800-813-HOPE (203)171-8308) ? [place] Assists [place] cancer patients and their families through emotional , educational and financial support.  778-647-3863 ? [place] DSS Where to apply for food stamps, Medicaid and utility assistance. (951) 495-3950 ? RCATS: Transportation to medical appointments. 8198656559 ? Social Security Administration: May apply for disability if have a Stage IV cancer. 438 509 2728 (310) 123-7891 ? [employer], Disability and Transit Services: Assists with nutrition, care and transit needs. [place] Support Programs: @10RELATIVEDAYS @ > Cancer Support Group  2nd Tuesday of the month 1pm-2pm, Journey Room  > Creative Journey  3rd Tuesday of the month 1130am-1pm, Journey Room  > Look Good Feel Better  1st Wednesday of the month 10am-12 noon, Journey Room (Call [place] to register [phone])

## 2015-12-11 ENCOUNTER — Encounter (HOSPITAL_COMMUNITY): Payer: Self-pay | Admitting: Internal Medicine

## 2015-12-12 DIAGNOSIS — B078 Other viral warts: Secondary | ICD-10-CM | POA: Diagnosis not present

## 2015-12-12 DIAGNOSIS — B355 Tinea imbricata: Secondary | ICD-10-CM | POA: Diagnosis not present

## 2015-12-12 DIAGNOSIS — X32XXXD Exposure to sunlight, subsequent encounter: Secondary | ICD-10-CM | POA: Diagnosis not present

## 2015-12-12 DIAGNOSIS — L570 Actinic keratosis: Secondary | ICD-10-CM | POA: Diagnosis not present

## 2016-01-06 ENCOUNTER — Encounter (HOSPITAL_COMMUNITY): Payer: Self-pay | Admitting: Hematology & Oncology

## 2016-01-10 DIAGNOSIS — E782 Mixed hyperlipidemia: Secondary | ICD-10-CM | POA: Diagnosis not present

## 2016-01-10 DIAGNOSIS — R972 Elevated prostate specific antigen [PSA]: Secondary | ICD-10-CM | POA: Diagnosis not present

## 2016-01-14 DIAGNOSIS — K219 Gastro-esophageal reflux disease without esophagitis: Secondary | ICD-10-CM | POA: Diagnosis not present

## 2016-01-14 DIAGNOSIS — Z0000 Encounter for general adult medical examination without abnormal findings: Secondary | ICD-10-CM | POA: Diagnosis not present

## 2016-01-14 DIAGNOSIS — R945 Abnormal results of liver function studies: Secondary | ICD-10-CM | POA: Diagnosis not present

## 2016-01-14 DIAGNOSIS — R7301 Impaired fasting glucose: Secondary | ICD-10-CM | POA: Diagnosis not present

## 2016-02-08 ENCOUNTER — Other Ambulatory Visit (HOSPITAL_COMMUNITY): Payer: Medicare Other

## 2016-02-11 ENCOUNTER — Ambulatory Visit (HOSPITAL_COMMUNITY): Payer: Medicare Other | Admitting: Hematology & Oncology

## 2016-02-25 DIAGNOSIS — Z8546 Personal history of malignant neoplasm of prostate: Secondary | ICD-10-CM | POA: Diagnosis not present

## 2016-06-11 ENCOUNTER — Other Ambulatory Visit (HOSPITAL_COMMUNITY): Payer: Medicare Other

## 2016-06-11 ENCOUNTER — Ambulatory Visit (HOSPITAL_COMMUNITY): Payer: Medicare Other | Admitting: Hematology & Oncology

## 2016-07-08 ENCOUNTER — Other Ambulatory Visit (HOSPITAL_COMMUNITY): Payer: Medicare Other

## 2016-07-08 ENCOUNTER — Ambulatory Visit (HOSPITAL_COMMUNITY): Payer: Medicare Other | Admitting: Hematology & Oncology

## 2016-07-16 DIAGNOSIS — E782 Mixed hyperlipidemia: Secondary | ICD-10-CM | POA: Diagnosis not present

## 2016-07-16 DIAGNOSIS — N529 Male erectile dysfunction, unspecified: Secondary | ICD-10-CM | POA: Diagnosis not present

## 2016-07-16 DIAGNOSIS — E785 Hyperlipidemia, unspecified: Secondary | ICD-10-CM | POA: Diagnosis not present

## 2016-07-16 DIAGNOSIS — I10 Essential (primary) hypertension: Secondary | ICD-10-CM | POA: Diagnosis not present

## 2016-07-16 DIAGNOSIS — E119 Type 2 diabetes mellitus without complications: Secondary | ICD-10-CM | POA: Diagnosis not present

## 2016-07-16 DIAGNOSIS — R7301 Impaired fasting glucose: Secondary | ICD-10-CM | POA: Diagnosis not present

## 2016-07-16 DIAGNOSIS — E039 Hypothyroidism, unspecified: Secondary | ICD-10-CM | POA: Diagnosis not present

## 2016-07-16 DIAGNOSIS — I482 Chronic atrial fibrillation: Secondary | ICD-10-CM | POA: Diagnosis not present

## 2016-07-18 DIAGNOSIS — K219 Gastro-esophageal reflux disease without esophagitis: Secondary | ICD-10-CM | POA: Diagnosis not present

## 2016-07-18 DIAGNOSIS — E782 Mixed hyperlipidemia: Secondary | ICD-10-CM | POA: Diagnosis not present

## 2016-07-18 DIAGNOSIS — R945 Abnormal results of liver function studies: Secondary | ICD-10-CM | POA: Diagnosis not present

## 2016-07-18 DIAGNOSIS — R202 Paresthesia of skin: Secondary | ICD-10-CM | POA: Diagnosis not present

## 2016-08-15 ENCOUNTER — Other Ambulatory Visit (HOSPITAL_COMMUNITY): Payer: Self-pay | Admitting: *Deleted

## 2016-08-18 ENCOUNTER — Encounter (HOSPITAL_COMMUNITY): Payer: Medicare Other

## 2016-08-18 ENCOUNTER — Encounter (HOSPITAL_COMMUNITY): Payer: Medicare Other | Attending: Hematology | Admitting: Hematology

## 2016-08-18 ENCOUNTER — Encounter (HOSPITAL_COMMUNITY): Payer: Self-pay | Admitting: Hematology

## 2016-08-18 VITALS — BP 152/77 | HR 66 | Temp 98.1°F | Resp 18 | Wt 249.2 lb

## 2016-08-18 DIAGNOSIS — Z9841 Cataract extraction status, right eye: Secondary | ICD-10-CM | POA: Diagnosis not present

## 2016-08-18 DIAGNOSIS — C189 Malignant neoplasm of colon, unspecified: Secondary | ICD-10-CM

## 2016-08-18 DIAGNOSIS — Z87442 Personal history of urinary calculi: Secondary | ICD-10-CM | POA: Insufficient documentation

## 2016-08-18 DIAGNOSIS — H269 Unspecified cataract: Secondary | ICD-10-CM | POA: Diagnosis not present

## 2016-08-18 DIAGNOSIS — Z85038 Personal history of other malignant neoplasm of large intestine: Secondary | ICD-10-CM

## 2016-08-18 DIAGNOSIS — Z9049 Acquired absence of other specified parts of digestive tract: Secondary | ICD-10-CM | POA: Diagnosis not present

## 2016-08-18 DIAGNOSIS — C180 Malignant neoplasm of cecum: Secondary | ICD-10-CM | POA: Diagnosis not present

## 2016-08-18 DIAGNOSIS — Z9889 Other specified postprocedural states: Secondary | ICD-10-CM | POA: Insufficient documentation

## 2016-08-18 DIAGNOSIS — C61 Malignant neoplasm of prostate: Secondary | ICD-10-CM | POA: Insufficient documentation

## 2016-08-18 DIAGNOSIS — Z87891 Personal history of nicotine dependence: Secondary | ICD-10-CM | POA: Insufficient documentation

## 2016-08-18 DIAGNOSIS — N400 Enlarged prostate without lower urinary tract symptoms: Secondary | ICD-10-CM | POA: Diagnosis not present

## 2016-08-18 DIAGNOSIS — K219 Gastro-esophageal reflux disease without esophagitis: Secondary | ICD-10-CM | POA: Insufficient documentation

## 2016-08-18 DIAGNOSIS — Z9842 Cataract extraction status, left eye: Secondary | ICD-10-CM | POA: Insufficient documentation

## 2016-08-18 LAB — COMPREHENSIVE METABOLIC PANEL
ALBUMIN: 4 g/dL (ref 3.5–5.0)
ALK PHOS: 67 U/L (ref 38–126)
ALT: 32 U/L (ref 17–63)
ANION GAP: 6 (ref 5–15)
AST: 41 U/L (ref 15–41)
BILIRUBIN TOTAL: 0.6 mg/dL (ref 0.3–1.2)
BUN: 13 mg/dL (ref 6–20)
CALCIUM: 8.7 mg/dL — AB (ref 8.9–10.3)
CO2: 27 mmol/L (ref 22–32)
Chloride: 101 mmol/L (ref 101–111)
Creatinine, Ser: 0.81 mg/dL (ref 0.61–1.24)
GFR calc Af Amer: >60 mL/min (ref >60)
GFR calc non Af Amer: >60 mL/min (ref >60)
GLUCOSE: 90 mg/dL (ref 65–99)
Potassium: 3.7 mmol/L (ref 3.5–5.1)
Sodium: 134 mmol/L — ABNORMAL LOW (ref 135–145)
TOTAL PROTEIN: 7.2 g/dL (ref 6.5–8.1)

## 2016-08-18 LAB — CBC WITH DIFFERENTIAL/PLATELET
BASOS PCT: 1 %
Basophils Absolute: 0.1 10*3/uL (ref 0.0–0.1)
Eosinophils Absolute: 0.3 10*3/uL (ref 0.0–0.7)
Eosinophils Relative: 4 %
HEMATOCRIT: 43.9 % (ref 39.0–52.0)
HEMOGLOBIN: 15 g/dL (ref 13.0–17.0)
LYMPHS ABS: 1.7 10*3/uL (ref 0.7–4.0)
LYMPHS PCT: 25 %
MCH: 29.8 pg (ref 26.0–34.0)
MCHC: 34.2 g/dL (ref 30.0–36.0)
MCV: 87.3 fL (ref 78.0–100.0)
MONOS PCT: 7 %
Monocytes Absolute: 0.5 10*3/uL (ref 0.1–1.0)
NEUTROS ABS: 4.4 10*3/uL (ref 1.7–7.7)
Neutrophils Relative %: 63 %
Platelets: 246 10*3/uL (ref 150–400)
RBC: 5.03 MIL/uL (ref 4.22–5.81)
RDW: 13.2 % (ref 11.5–15.5)
WBC: 7 10*3/uL (ref 4.0–10.5)

## 2016-08-18 NOTE — Patient Instructions (Signed)
[place] at [facility] Discharge Instructions  RECOMMENDATIONS MADE BY THE CONSULTANT AND ANY TEST RESULTS WILL BE SENT TO YOUR REFERRING PHYSICIAN.  You were seen today by Dr. Irene Limbo Follow up in 4 months with CT scans and labs See Amy up front for appointments   Thank you for choosing [place] at [facility] to provide your oncology and hematology care.  To afford each patient quality time with our provider, please arrive at least 15 minutes before your scheduled appointment time.    If you have a lab appointment with the [place] please come in thru the  Main Entrance and check in at the main information desk  You need to re-schedule your appointment should you arrive 10 or more minutes late.  We strive to give you quality time with our providers, and arriving late affects you and other patients whose appointments are after yours.  Also, if you no show three or more times for appointments you may be dismissed from the clinic at the providers discretion.     Again, thank you for choosing [facility].  Our hope is that these requests will decrease the amount of time that you wait before being seen by our physicians.       _____________________________________________________________  Should you have questions after your visit to [facility], please contact our office at [phone] between the hours of 8:30 a.m. and 4:30 p.m.  Voicemails left after 4:30 p.m. will not be returned until the following business day.  For prescription refill requests, have your pharmacy contact our office.       Resources For Cancer Patients and their Caregivers ? American Cancer Society: Can assist with transportation, wigs, general needs, runs Look Good Feel Better.        915-004-4018 ? Cancer Care: Provides financial assistance, online support groups, medication/co-pay assistance.  1-800-813-HOPE (779) 579-9575) ? [place] Assists [place] cancer patients and their families through emotional , educational and financial support.  (830)238-0341 ? [place] DSS Where to apply for food stamps, Medicaid and utility assistance. 6826602846 ? RCATS: Transportation to medical appointments. 219-754-2649 ? Social Security Administration: May apply for disability if have a Stage IV cancer. 419-374-5400 (302) 163-7341 ? [employer], Disability and Transit Services: Assists with nutrition, care and transit needs. [place] Support Programs: @10RELATIVEDAYS @ > Cancer Support Group  2nd Tuesday of the month 1pm-2pm, Journey Room  > Creative Journey  3rd Tuesday of the month 1130am-1pm, Journey Room  > Look Good Feel Better  1st Wednesday of the month 10am-12 noon, Journey Room (Call [place] to register [phone])

## 2016-08-19 DIAGNOSIS — M79676 Pain in unspecified toe(s): Secondary | ICD-10-CM | POA: Diagnosis not present

## 2016-08-19 DIAGNOSIS — B351 Tinea unguium: Secondary | ICD-10-CM | POA: Diagnosis not present

## 2016-08-19 LAB — CEA: CEA: 3.4 ng/mL (ref 0.0–4.7)

## 2016-08-20 NOTE — Progress Notes (Signed)
Marland Kitchen  HEMATOLOGY ONCOLOGY PROGRESS NOTE  Date of service: .08/18/2016  Patient Care Team: Celene Squibb, MD as PCP - General (Internal Medicine)  CC: f/u for cecal ca and prostate cancer  Diagnosis:  Stage II-A carcinoma of the cecum R hemicolectomy on 07/07/2011, no additional therapy Colonoscopy on 07/2012 with sessile adenoma Adenocarcinoma of prostate s/p protatectomy on 01/19/2015, Gleason score 4 + 3 = 7, pT2c, pN0 stage  Current Treatment: Observation   INTERVAL HISTORY:  Patient is here for follow-up of his history of colon cancer diagnosed in 2013 status post right hemicolectomy. He was last seen in clinic on 12/10/2015 by Dr. Whitney Muse. He notes no change in bowel habits. No rectal bleeding noted. Weights been stable. No new urinary symptoms. Follows with urology for monitoring of his prostate cancer. Patient last had a colonoscopy on 12/05/2015 were a few small polyps were removed but no recurrent malignancy. 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  REVIEW OF SYSTEMS:    10 Point review of systems of done and is negative except as noted above.  . Past Medical History:  Diagnosis Date  . BPH (benign prostatic hyperplasia)   . Cancer ([place])    colon  . Cataract   . Colonic mass 07/03/2011  . Difficult intubation    extra long tube for intub per wife and slow to wake up   . Erosive gastritis 07/03/2011  . GERD (gastroesophageal reflux disease)   . Kidney stones 2011  . Renal calculus   . Tooth infection 07/03/2011    . Past Surgical History:  Procedure Laterality Date  . CATARACT EXTRACTION W/PHACO Left 08/23/2012   Procedure: CATARACT EXTRACTION PHACO AND INTRAOCULAR LENS PLACEMENT (IOC);  Surgeon: Tonny Branch, MD;  Location: [facility];  Service: Ophthalmology;  Laterality: Left;  CDE 16.64  . CATARACT EXTRACTION W/PHACO Right 06/09/2013   Procedure: CATARACT EXTRACTION PHACO AND INTRAOCULAR LENS PLACEMENT (IOC) CDE=10.86;  Surgeon: Tonny Branch, MD;   Location: [facility];  Service: Ophthalmology;  Laterality: Right;  . COLON SURGERY    . COLONOSCOPY  07/03/2011   Procedure: COLONOSCOPY;  Surgeon: Rogene Houston, MD;  [facility];  Service: Endoscopy;  Laterality: N/A;  . COLONOSCOPY N/A 09/16/2012   Procedure: COLONOSCOPY;  Surgeon: Rogene Houston, MD;  [facility];  Service: Endoscopy;  Laterality: N/A;  930  . COLONOSCOPY N/A 12/05/2015   Procedure: COLONOSCOPY;  Surgeon: Rogene Houston, MD;  [facility];  Service: Endoscopy;  Laterality: N/A;  1200  . CYSTOSCOPY  07/09/2011   Procedure: CYSTOSCOPY FLEXIBLE;  Surgeon: Marissa Nestle, MD;  Location: [facility];  Service: Urology;  Laterality: N/A;  . CYSTOSCOPY WITH LITHOLAPAXY  07/11/2011   Procedure: CYSTOSCOPY WITH LITHOLAPAXY;  Surgeon: Marissa Nestle, MD;  Location: [facility];  Service: Urology;  Laterality: N/A;  . ESOPHAGOGASTRODUODENOSCOPY  07/03/2011   Procedure: ESOPHAGOGASTRODUODENOSCOPY (EGD);  Surgeon: Rogene Houston, MD;  [facility];  Service: Endoscopy;  Laterality: N/A;  . HERNIA REPAIR  1610    umbilical   . PARTIAL COLECTOMY  07/04/2011   Procedure: PARTIAL COLECTOMY;  Surgeon: Jamesetta So, MD;  Location: [facility];  Service: General;  Laterality: N/A;  . Prostage cancer    . PROSTATE BIOPSY    . ROBOT ASSISTED LAPAROSCOPIC RADICAL PROSTATECTOMY Bilateral 01/19/2015   Procedure: ROBOTIC ASSISTED LAPAROSCOPIC RADICAL PROSTATECTOMY AND BILATERAL PELVIC LYMPH NODE DISSECTION, LAPARASCOPIC  LYSIS OF ADHESIONS;  Surgeon: Marland Kitchen  Ardis Hughs, MD;  Location: [facility];  Service: Urology;  Laterality: Bilateral;  . TRANSURETHRAL RESECTION OF PROSTATE  07/11/2011   Procedure: TRANSURETHRAL RESECTION OF THE PROSTATE (TURP);  Surgeon: Marissa Nestle, MD;  Location: [facility];  Service: Urology;  Laterality: N/A;    . Social History  Substance Use Topics  . Smoking status: Former Smoker    Packs/day: 2.00    Years: 25.00    Types: Cigarettes     Quit date: 06/02/1989  . Smokeless tobacco: Never Used  . Alcohol use No    ALLERGIES:  has No Known Allergies.  MEDICATIONS:  Current Outpatient Prescriptions  Medication Sig Dispense Refill  . Fish Oil OIL by Does not apply route.    . Multiple Vitamins-Minerals (MULTIVITAMIN MEN PO) Take by mouth.    . pantoprazole (PROTONIX) 40 MG tablet TAKE 1 TABLET BY MOUTH ONCE DAILY. 90 tablet 3  . Probiotic CAPS Take 1 capsule by mouth every morning. Taking PB8     No current facility-administered medications for this visit.     PHYSICAL EXAMINATION: ECOG PERFORMANCE STATUS: 2 - Symptomatic, <50% confined to bed  . Vitals:   08/18/16 1512  BP: (!) 152/77  Pulse: 66  Resp: 18  Temp: 98.1 F (36.7 C)    Filed Weights   08/18/16 1512  Weight: 249 lb 3.2 oz (113 kg)   .Body mass index is 32 kg/m.  GENERAL:alert, in no acute distress and comfortable SKIN: no acute rashes, no significant lesions EYES: conjunctiva are pink and non-injected, sclera anicteric OROPHARYNX: MMM, no exudates, no oropharyngeal erythema or ulceration NECK: supple, no JVD LYMPH:  no palpable lymphadenopathy in the cervical, axillary or inguinal regions LUNGS: clear to auscultation b/l with normal respiratory effort HEART: regular rate & rhythm ABDOMEN:  normoactive bowel sounds , non tender, not distended. Extremity: no pedal edema PSYCH: alert & oriented x 3 with fluent speech NEURO: no focal motor/sensory deficits  LABORATORY DATA:   I have reviewed the data as listed  . CBC Latest Ref Rng & Units 08/18/2016 08/07/2015 02/06/2015  WBC 4.0 - 10.5 K/uL 7.0 6.9 8.1  Hemoglobin 13.0 - 17.0 g/dL 15.0 14.5 12.9(L)  Hematocrit 39.0 - 52.0 % 43.9 43.4 38.2(L)  Platelets 150 - 400 K/uL 246 259 357    . CMP Latest Ref Rng & Units 08/18/2016 12/07/2015 08/07/2015  Glucose 65 - 99 mg/dL 90 - 103(H)  BUN 6 - 20 mg/dL 13 - 15  Creatinine 0.61 - 1.24 mg/dL 0.81 1.00 0.93  Sodium 135 - 145 mmol/L 134(L) - 139    Potassium 3.5 - 5.1 mmol/L 3.7 - 4.0  Chloride 101 - 111 mmol/L 101 - 103  CO2 22 - 32 mmol/L 27 - 30  Calcium 8.9 - 10.3 mg/dL 8.7(L) - 9.0  Total Protein 6.5 - 8.1 g/dL 7.2 - 7.1  Total Bilirubin 0.3 - 1.2 mg/dL 0.6 - 0.9  Alkaline Phos 38 - 126 U/L 67 - 64  AST 15 - 41 U/L 41 - 35  ALT 17 - 63 U/L 32 - 28     RADIOGRAPHIC STUDIES: I have personally reviewed the radiological images as listed and agreed with the findings in the report. No results found.  ASSESSMENT & PLAN:   1)Stage IIA Colorectal Cancer, diagnosed 07/2011 R hemicolectomy with Dr. Arnoldo Morale   Plan -Patient has no clinical symptomatology suggestive of recurrent colon cancer at this time. His labs are stable. CEA levels are within normal limits and not increased. -  Return to care with M.D./PA in 4 months with a CT chest abdomen pelvis (12 mth rpt imaging) and labs  2) Prostate cancer  -continue f/u with urology  I sp Lahey ent 15 minutes counseling the patient face to face. The total time spent in the appointment was 20 minutes and more than 50% was on counseling and direct patient cares.    Sullivan Lone MD [place] AAHIVMS [facility] Hematology/Oncology Physician [facility]  (Office):       [phone] (Work cell):  [phone] (Fax):           [phone]

## 2016-09-15 DIAGNOSIS — N393 Stress incontinence (female) (male): Secondary | ICD-10-CM | POA: Diagnosis not present

## 2016-09-15 DIAGNOSIS — N5201 Erectile dysfunction due to arterial insufficiency: Secondary | ICD-10-CM | POA: Diagnosis not present

## 2016-09-15 DIAGNOSIS — Z8546 Personal history of malignant neoplasm of prostate: Secondary | ICD-10-CM | POA: Diagnosis not present

## 2016-11-25 ENCOUNTER — Other Ambulatory Visit (INDEPENDENT_AMBULATORY_CARE_PROVIDER_SITE_OTHER): Payer: Self-pay | Admitting: Internal Medicine

## 2016-12-16 ENCOUNTER — Ambulatory Visit (HOSPITAL_COMMUNITY)
Admission: RE | Admit: 2016-12-16 | Discharge: 2016-12-16 | Disposition: A | Discharge status: Home / Self Care | Payer: Medicare Other | Source: Ambulatory Visit | Attending: Hematology | Admitting: Hematology

## 2016-12-16 ENCOUNTER — Encounter (HOSPITAL_COMMUNITY): Payer: Medicare Other | Attending: Oncology

## 2016-12-16 DIAGNOSIS — Z9049 Acquired absence of other specified parts of digestive tract: Secondary | ICD-10-CM | POA: Insufficient documentation

## 2016-12-16 DIAGNOSIS — I700 Atherosclerosis of aorta: Secondary | ICD-10-CM | POA: Diagnosis not present

## 2016-12-16 DIAGNOSIS — C189 Malignant neoplasm of colon, unspecified: Secondary | ICD-10-CM

## 2016-12-16 DIAGNOSIS — N200 Calculus of kidney: Secondary | ICD-10-CM | POA: Insufficient documentation

## 2016-12-16 DIAGNOSIS — R911 Solitary pulmonary nodule: Secondary | ICD-10-CM | POA: Diagnosis not present

## 2016-12-16 DIAGNOSIS — R918 Other nonspecific abnormal finding of lung field: Secondary | ICD-10-CM | POA: Diagnosis not present

## 2016-12-16 LAB — COMPREHENSIVE METABOLIC PANEL
ALT: 36 U/L (ref 17–63)
AST: 45 U/L — AB (ref 15–41)
Albumin: 4.2 g/dL (ref 3.5–5.0)
Alkaline Phosphatase: 70 U/L (ref 38–126)
Anion gap: 9 (ref 5–15)
BILIRUBIN TOTAL: 0.7 mg/dL (ref 0.3–1.2)
BUN: 11 mg/dL (ref 6–20)
CO2: 27 mmol/L (ref 22–32)
CREATININE: 1.01 mg/dL (ref 0.61–1.24)
Calcium: 9 mg/dL (ref 8.9–10.3)
Chloride: 102 mmol/L (ref 101–111)
GFR calc Af Amer: >60 mL/min (ref >60)
Glucose, Bld: 90 mg/dL (ref 65–99)
Potassium: 4 mmol/L (ref 3.5–5.1)
Sodium: 138 mmol/L (ref 135–145)
Total Protein: 7.5 g/dL (ref 6.5–8.1)

## 2016-12-16 LAB — CBC WITH DIFFERENTIAL/PLATELET
BASOS ABS: 0.1 10*3/uL (ref 0.0–0.1)
Basophils Relative: 1 %
Eosinophils Absolute: 0.4 10*3/uL (ref 0.0–0.7)
Eosinophils Relative: 5 %
HEMATOCRIT: 44.4 % (ref 39.0–52.0)
Hemoglobin: 15.1 g/dL (ref 13.0–17.0)
LYMPHS PCT: 24 %
Lymphs Abs: 1.8 10*3/uL (ref 0.7–4.0)
MCH: 30 pg (ref 26.0–34.0)
MCHC: 34 g/dL (ref 30.0–36.0)
MCV: 88.3 fL (ref 78.0–100.0)
MONO ABS: 0.5 10*3/uL (ref 0.1–1.0)
Monocytes Relative: 7 %
NEUTROS ABS: 4.8 10*3/uL (ref 1.7–7.7)
Neutrophils Relative %: 63 %
Platelets: 280 10*3/uL (ref 150–400)
RBC: 5.03 MIL/uL (ref 4.22–5.81)
RDW: 13.5 % (ref 11.5–15.5)
WBC: 7.5 10*3/uL (ref 4.0–10.5)

## 2016-12-16 MED ORDER — IOPAMIDOL (ISOVUE-300) INJECTION 61%
100.0000 mL | Freq: Once | INTRAVENOUS | Status: AC | PRN
  Administered 2016-12-16: 100 mL via INTRAVENOUS

## 2016-12-17 LAB — CEA: CEA1: 3.7 ng/mL (ref 0.0–4.7)

## 2016-12-23 ENCOUNTER — Encounter (HOSPITAL_BASED_OUTPATIENT_CLINIC_OR_DEPARTMENT_OTHER): Payer: Medicare Other | Admitting: Oncology

## 2016-12-23 ENCOUNTER — Encounter (HOSPITAL_COMMUNITY): Payer: Self-pay

## 2016-12-23 VITALS — BP 154/71 | HR 66 | Resp 18 | Wt 251.0 lb

## 2016-12-23 DIAGNOSIS — K4090 Unilateral inguinal hernia, without obstruction or gangrene, not specified as recurrent: Secondary | ICD-10-CM | POA: Diagnosis not present

## 2016-12-23 DIAGNOSIS — Z8546 Personal history of malignant neoplasm of prostate: Secondary | ICD-10-CM

## 2016-12-23 DIAGNOSIS — C189 Malignant neoplasm of colon, unspecified: Secondary | ICD-10-CM

## 2016-12-23 DIAGNOSIS — R911 Solitary pulmonary nodule: Secondary | ICD-10-CM | POA: Diagnosis not present

## 2016-12-23 DIAGNOSIS — Z85038 Personal history of other malignant neoplasm of large intestine: Secondary | ICD-10-CM | POA: Diagnosis not present

## 2016-12-23 NOTE — Patient Instructions (Signed)
[place] at [facility] Discharge Instructions  RECOMMENDATIONS MADE BY THE CONSULTANT AND ANY TEST RESULTS WILL BE SENT TO YOUR REFERRING PHYSICIAN.   Seen by Dr. Talbert Cage today Return in about a year CT chest in 1 year.  Thank you for choosing [place] at [facility] to provide your oncology and hematology care.  To afford each patient quality time with our provider, please arrive at least 15 minutes before your scheduled appointment time.    If you have a lab appointment with the [facility] please come in thru the  Main Entrance and check in at the main information desk  You need to re-schedule your appointment should you arrive 10 or more minutes late.  We strive to give you quality time with our providers, and arriving late affects you and other patients whose appointments are after yours.  Also, if you no show three or more times for appointments you may be dismissed from the clinic at the providers discretion.     Again, thank you for choosing [facility].  Our hope is that these requests will decrease the amount of time that you wait before being seen by our physicians.       _____________________________________________________________  Should you have questions after your visit to [facility], please contact our office at [phone] between the hours of 8:30 a.m. and 4:30 p.m.  Voicemails left after 4:30 p.m. will not be returned until the following business day.  For prescription refill requests, have your pharmacy contact our office.       Resources For Cancer Patients and their Caregivers ? American Cancer Society: Can assist with transportation, wigs, general needs, runs Look Good Feel Better.        (580) 176-8574 ? Cancer Care: Provides financial assistance, online support groups, medication/co-pay assistance.  1-800-813-HOPE 757-438-9089) ? [facility] Assists [place]  cancer patients and their families through emotional , educational and financial support.  276-776-6063 ? [place] DSS Where to apply for food stamps, Medicaid and utility assistance. 860-808-4552 ? [employer]: Transportation to medical appointments. 513-146-6663 ? Social Security Administration: May apply for disability if have a Stage IV cancer. 947-339-3939 (406)479-9825 ? [employer], Disability and Transit Services: Assists with nutrition, care and transit needs. [place] Support Programs: @10RELATIVEDAYS @ > Cancer Support Group  2nd Tuesday of the month 1pm-2pm, Journey Room  > Creative Journey  3rd Tuesday of the month 1130am-1pm, Journey Room  > Look Good Feel Better  1st Wednesday of the month 10am-12 noon, Journey Room (Call [place] to register [phone])

## 2016-12-23 NOTE — Progress Notes (Signed)
HEMATOLOGY ONCOLOGY PROGRESS NOTE  Date of service: .12/23/2016  Patient Care Team: Celene Squibb, MD as PCP - General (Internal Medicine)  CC: f/u for cecal ca and prostate cancer  Diagnosis:  Stage II-A carcinoma of the cecum R hemicolectomy on 07/07/2011, no additional therapy Colonoscopy on 07/2012 with sessile adenoma Adenocarcinoma of prostate s/p protatectomy on 01/19/2015, Gleason score 4 + 3 = 7, pT2c, pN0 stage  Current Treatment: Observation   INTERVAL HISTORY:  Patient is here for follow-up of his history of colon cancer diagnosed in 2013 status post right hemicolectomy. He states that overall he has been doing well. He states that recently he has been having right inguinal pain every time he coughs, he does have an inguinal hernia there. He has chronic intermittent diarrhea which is stable. He denies any GI bleeding including hematuria and melena. He denies any shortness of breath, chest pain, recent infections.  REVIEW OF SYSTEMS:    10 Point review of systems of done and is negative except as noted above.  . Past Medical History:  Diagnosis Date  . BPH (benign prostatic hyperplasia)   . Cancer ([place])    colon  . Cataract   . Colonic mass 07/03/2011  . Difficult intubation    extra long tube for intub per wife and slow to wake up   . Erosive gastritis 07/03/2011  . GERD (gastroesophageal reflux disease)   . Kidney stones 2011  . Renal calculus   . Tooth infection 07/03/2011    . Past Surgical History:  Procedure Laterality Date  . CATARACT EXTRACTION W/PHACO Left 08/23/2012   Procedure: CATARACT EXTRACTION PHACO AND INTRAOCULAR LENS PLACEMENT (IOC);  Surgeon: Tonny Branch, MD;  Location: [facility];  Service: Ophthalmology;  Laterality: Left;  CDE 16.64  . CATARACT EXTRACTION W/PHACO Right 06/09/2013   Procedure: CATARACT EXTRACTION PHACO AND INTRAOCULAR LENS PLACEMENT (IOC) CDE=10.86;  Surgeon: Tonny Branch, MD;  Location: [facility];  Service: Ophthalmology;  Laterality:  Right;  . COLON SURGERY    . COLONOSCOPY  07/03/2011   Procedure: COLONOSCOPY;  Surgeon: Rogene Houston, MD;  [facility];  Service: Endoscopy;  Laterality: N/A;  . COLONOSCOPY N/A 09/16/2012   Procedure: COLONOSCOPY;  Surgeon: Rogene Houston, MD;  [facility];  Service: Endoscopy;  Laterality: N/A;  930  . COLONOSCOPY N/A 12/05/2015   Procedure: COLONOSCOPY;  Surgeon: Rogene Houston, MD;  [facility];  Service: Endoscopy;  Laterality: N/A;  1200  . CYSTOSCOPY  07/09/2011   Procedure: CYSTOSCOPY FLEXIBLE;  Surgeon: Marissa Nestle, MD;  Location: [facility];  Service: Urology;  Laterality: N/A;  . CYSTOSCOPY WITH LITHOLAPAXY  07/11/2011   Procedure: CYSTOSCOPY WITH LITHOLAPAXY;  Surgeon: Marissa Nestle, MD;  Location: [facility];  Service: Urology;  Laterality: N/A;  . ESOPHAGOGASTRODUODENOSCOPY  07/03/2011   Procedure: ESOPHAGOGASTRODUODENOSCOPY (EGD);  Surgeon: Rogene Houston, MD;  [facility];  Service: Endoscopy;  Laterality: N/A;  . HERNIA REPAIR  6812    umbilical   . PARTIAL COLECTOMY  07/04/2011   Procedure: PARTIAL COLECTOMY;  Surgeon: Jamesetta So, MD;  Location: [facility];  Service: General;  Laterality: N/A;  . Prostage cancer    . PROSTATE BIOPSY    . ROBOT ASSISTED LAPAROSCOPIC RADICAL PROSTATECTOMY Bilateral 01/19/2015   Procedure: ROBOTIC ASSISTED LAPAROSCOPIC RADICAL PROSTATECTOMY AND BILATERAL PELVIC LYMPH NODE DISSECTION, LAPARASCOPIC  LYSIS OF ADHESIONS;  Surgeon: Ardis Hughs, MD;  Location: [facility];  Service: Urology;  Laterality: Bilateral;  . TRANSURETHRAL RESECTION OF PROSTATE  07/11/2011   Procedure: TRANSURETHRAL RESECTION OF THE PROSTATE (TURP);  Surgeon: Marissa Nestle, MD;  Location: [facility];  Service: Urology;  Laterality: N/A;    . Social History  Substance Use Topics  . Smoking status: Former Smoker    Packs/day: 2.00    Years: 25.00    Types: Cigarettes    Quit date: 06/02/1989  . Smokeless tobacco: Never Used   . Alcohol use No    ALLERGIES:  has No Known Allergies.  MEDICATIONS:  Current Outpatient Prescriptions  Medication Sig Dispense Refill  . Fish Oil OIL by Does not apply route.    . Multiple Vitamins-Minerals (MULTIVITAMIN MEN PO) Take by mouth.    . pantoprazole (PROTONIX) 40 MG tablet TAKE 1 TABLET BY MOUTH ONCE DAILY. 90 tablet 3  . Probiotic CAPS Take 1 capsule by mouth every morning. Taking PB8     No current facility-administered medications for this visit.     PHYSICAL EXAMINATION: ECOG PERFORMANCE STATUS: 2 - Symptomatic, <50% confined to bed  . Vitals:   12/23/16 1514  BP: (!) 154/71  Pulse: 66  Resp: 18    Filed Weights   12/23/16 1514  Weight: 251 lb (113.9 kg)   .Body mass index is 32.23 kg/m.  Physical Exam  Constitutional: He is oriented to person, place, and time. He appears well-developed and well-nourished. No distress.  HENT:  Head: Normocephalic and atraumatic.  Mouth/Throat: No oropharyngeal exudate.  Eyes: Pupils are equal, round, and reactive to light. Conjunctivae are normal. No scleral icterus.  Neck: Normal range of motion. Neck supple.  Cardiovascular: Normal rate, regular rhythm and normal heart sounds.  Exam reveals no gallop and no friction rub.   No murmur heard. Pulmonary/Chest: Effort normal and breath sounds normal. No respiratory distress. He has no wheezes.  Abdominal: Soft. Bowel sounds are normal. He exhibits no distension. There is no tenderness. There is no rebound.  Musculoskeletal: Normal range of motion. He exhibits no edema or tenderness.  Lymphadenopathy:    He has no cervical adenopathy.  Neurological: He is alert and oriented to person, place, and time. No cranial nerve deficit.  Skin: Skin is warm and dry. No rash noted. No erythema. No pallor.  Psychiatric: He has a normal mood and affect. Thought content normal.     LABORATORY DATA:   I have reviewed the data as listed  . CBC Latest Ref Rng & Units 12/16/2016  08/18/2016 08/07/2015  WBC 4.0 - 10.5 K/uL 7.5 7.0 6.9  Hemoglobin 13.0 - 17.0 g/dL 15.1 15.0 14.5  Hematocrit 39.0 - 52.0 % 44.4 43.9 43.4  Platelets 150 - 400 K/uL 280 246 259    . CMP Latest Ref Rng & Units 12/16/2016 08/18/2016 12/07/2015  Glucose 65 - 99 mg/dL 90 90 -  BUN 6 - 20 mg/dL 11 13 -  Creatinine 0.61 - 1.24 mg/dL 1.01 0.81 1.00  Sodium 135 - 145 mmol/L 138 134(L) -  Potassium 3.5 - 5.1 mmol/L 4.0 3.7 -  Chloride 101 - 111 mmol/L 102 101 -  CO2 22 - 32 mmol/L 27 27 -  Calcium 8.9 - 10.3 mg/dL 9.0 8.7(L) -  Total Protein 6.5 - 8.1 g/dL 7.5 7.2 -  Total Bilirubin 0.3 - 1.2 mg/dL 0.7 0.6 -  Alkaline Phos 38 - 126 U/L 70 67 -  AST 15 - 41 U/L 45(H) 41 -  ALT 17 - 63 U/L 36 32 -  RADIOGRAPHIC STUDIES: I have personally reviewed the radiological images as listed and agreed with the findings in the report. Ct Chest W Contrast  Result Date: 12/17/2016 CLINICAL DATA:  Colon cancer.  Prostate cancer. EXAM: CT CHEST, ABDOMEN, AND PELVIS WITH CONTRAST TECHNIQUE: Multidetector CT imaging of the chest, abdomen and pelvis was performed following the standard protocol during bolus administration of intravenous contrast. CONTRAST:  177mL ISOVUE-300 IOPAMIDOL (ISOVUE-300) INJECTION 61% COMPARISON:  PET-CT 09/06/2015. Abdomen and pelvis CT 08/16/2015. Chest CT 07/29/2013. FINDINGS: CT CHEST FINDINGS Cardiovascular: The heart size is normal. No pericardial effusion. Coronary artery calcification is noted. Atherosclerotic calcification is noted in the wall of the thoracic aorta. Mediastinum/Nodes: 11 mm right thyroid nodule was 10 mm on the prior study. No mediastinal lymphadenopathy. There is no hilar lymphadenopathy. The esophagus has normal imaging features. There is no axillary lymphadenopathy. Lungs/Pleura: 4 mm right lower lobe pulmonary nodule was not seen on prior imaging studies. Atelectasis noted at the left lung base. No focal airspace consolidation. No pulmonary edema or pleural  effusion. Musculoskeletal: Bone windows reveal no worrisome lytic or sclerotic osseous lesions. CT ABDOMEN PELVIS FINDINGS Hepatobiliary: No focal abnormality within the liver parenchyma. Gallbladder is decompressed. No intrahepatic or extrahepatic biliary dilation. Pancreas: No focal mass lesion. No dilatation of the main duct. No intraparenchymal cyst. No peripancreatic edema. Spleen: No splenomegaly. No focal mass lesion. Adrenals/Urinary Tract: No adrenal nodule or mass. A pair of tiny 1-2 mm nonobstructing stones is identified in the interpolar right kidney. Several nonobstructing stones are seen in the left kidney measuring up to 7 mm in the lower pole. No enhancing lesion in either kidney. No ureteral stones or hydroureter. Bladder is unremarkable. Stomach/Bowel: Stomach is nondistended. No gastric wall thickening. No evidence of outlet obstruction. Duodenum is normally positioned as is the ligament of Treitz. No small bowel wall thickening. No small bowel dilatation. Patient is status post right hemicolectomy. Diverticular changes are noted in the left colon without evidence of diverticulitis. Vascular/Lymphatic: There is abdominal aortic atherosclerosis without aneurysm. There is no gastrohepatic or hepatoduodenal ligament lymphadenopathy. No intraperitoneal or retroperitoneal lymphadenopathy. 1.6 cm water density lesion along the right pelvic sidewall is stable and likely reflects seroma. 8 mm short axis external iliac lymph node measured on the previous PET-CT is stable at 8 mm. The left external iliac lymph node measured at 10 mm short axis on the previous study measures 9 mm today. Reproductive: Prostate gland surgically absent. Other: No intraperitoneal free fluid. Musculoskeletal: Right groin hernia contains a short segment of small bowel without evidence for incarceration or obstruction. Left groin hernia contains only fat. Ventral mesh is again noted. IMPRESSION: 1. New 4 mm right lower lobe  pulmonary nodule. Unlikely to be metastatic, but attention on follow-up imaging is recommended. 2. Stable bilateral external iliac lymph nodes comparing to PET-CT of 09/06/2015. No new or progressive findings to suggest new metastatic disease. 3. Bilateral nonobstructing nephrolithiasis. 4.  Aortic Atherosclerois (ICD10-170.0) Electronically Signed   By: Misty Stanley M.D.   On: 12/17/2016 10:03   Ct Abdomen Pelvis W Contrast  Result Date: 12/17/2016 CLINICAL DATA:  Colon cancer.  Prostate cancer. EXAM: CT CHEST, ABDOMEN, AND PELVIS WITH CONTRAST TECHNIQUE: Multidetector CT imaging of the chest, abdomen and pelvis was performed following the standard protocol during bolus administration of intravenous contrast. CONTRAST:  125mL ISOVUE-300 IOPAMIDOL (ISOVUE-300) INJECTION 61% COMPARISON:  PET-CT 09/06/2015. Abdomen and pelvis CT 08/16/2015. Chest CT 07/29/2013. FINDINGS: CT CHEST FINDINGS Cardiovascular: The heart size is normal. No pericardial  effusion. Coronary artery calcification is noted. Atherosclerotic calcification is noted in the wall of the thoracic aorta. Mediastinum/Nodes: 11 mm right thyroid nodule was 10 mm on the prior study. No mediastinal lymphadenopathy. There is no hilar lymphadenopathy. The esophagus has normal imaging features. There is no axillary lymphadenopathy. Lungs/Pleura: 4 mm right lower lobe pulmonary nodule was not seen on prior imaging studies. Atelectasis noted at the left lung base. No focal airspace consolidation. No pulmonary edema or pleural effusion. Musculoskeletal: Bone windows reveal no worrisome lytic or sclerotic osseous lesions. CT ABDOMEN PELVIS FINDINGS Hepatobiliary: No focal abnormality within the liver parenchyma. Gallbladder is decompressed. No intrahepatic or extrahepatic biliary dilation. Pancreas: No focal mass lesion. No dilatation of the main duct. No intraparenchymal cyst. No peripancreatic edema. Spleen: No splenomegaly. No focal mass lesion.  Adrenals/Urinary Tract: No adrenal nodule or mass. A pair of tiny 1-2 mm nonobstructing stones is identified in the interpolar right kidney. Several nonobstructing stones are seen in the left kidney measuring up to 7 mm in the lower pole. No enhancing lesion in either kidney. No ureteral stones or hydroureter. Bladder is unremarkable. Stomach/Bowel: Stomach is nondistended. No gastric wall thickening. No evidence of outlet obstruction. Duodenum is normally positioned as is the ligament of Treitz. No small bowel wall thickening. No small bowel dilatation. Patient is status post right hemicolectomy. Diverticular changes are noted in the left colon without evidence of diverticulitis. Vascular/Lymphatic: There is abdominal aortic atherosclerosis without aneurysm. There is no gastrohepatic or hepatoduodenal ligament lymphadenopathy. No intraperitoneal or retroperitoneal lymphadenopathy. 1.6 cm water density lesion along the right pelvic sidewall is stable and likely reflects seroma. 8 mm short axis external iliac lymph node measured on the previous PET-CT is stable at 8 mm. The left external iliac lymph node measured at 10 mm short axis on the previous study measures 9 mm today. Reproductive: Prostate gland surgically absent. Other: No intraperitoneal free fluid. Musculoskeletal: Right groin hernia contains a short segment of small bowel without evidence for incarceration or obstruction. Left groin hernia contains only fat. Ventral mesh is again noted. IMPRESSION: 1. New 4 mm right lower lobe pulmonary nodule. Unlikely to be metastatic, but attention on follow-up imaging is recommended. 2. Stable bilateral external iliac lymph nodes comparing to PET-CT of 09/06/2015. No new or progressive findings to suggest new metastatic disease. 3. Bilateral nonobstructing nephrolithiasis. 4.  Aortic Atherosclerois (ICD10-170.0) Electronically Signed   By: Misty Stanley M.D.   On: 12/17/2016 10:03   CT C/A/P  12/16/16: IMPRESSION: 1. New 4 mm right lower lobe pulmonary nodule. Unlikely to be metastatic, but attention on follow-up imaging is recommended. 2. Stable bilateral external iliac lymph nodes comparing to PET-CT of 09/06/2015. No new or progressive findings to suggest new metastatic disease. 3. Bilateral nonobstructing nephrolithiasis. 4.  Aortic Atherosclerois (ICD10-170.0)  ASSESSMENT & PLAN:   1)Stage IIA Colorectal Cancer, diagnosed 07/2011 R hemicolectomy with Dr. Arnoldo Morale  -Reviewed patient's CT scans in detail with the patient. He has no evidence of recurrent disease. He is now 5 years out. Will move his follow up out to yearly.  2. 81mm RLL pulmonary nodule  -I have ordered a CT chest w/o contrast for follow up to be done in 1 year to ensure stability.  3. Prostate cancer  -continue f/u with urology. Currently not on any treatment.  4. Right inguinal hernia -Refer to Dr. Arnoldo Morale for evaluation of inguinal hernia repair.  Twana First, MD

## 2016-12-29 ENCOUNTER — Other Ambulatory Visit: Payer: Self-pay

## 2016-12-30 ENCOUNTER — Encounter: Payer: Self-pay | Admitting: General Surgery

## 2016-12-30 ENCOUNTER — Ambulatory Visit (INDEPENDENT_AMBULATORY_CARE_PROVIDER_SITE_OTHER): Payer: Medicare Other | Admitting: General Surgery

## 2016-12-30 VITALS — BP 130/72 | HR 74 | Temp 98.7°F | Resp 18 | Ht 75.0 in | Wt 250.0 lb

## 2016-12-30 DIAGNOSIS — K4090 Unilateral inguinal hernia, without obstruction or gangrene, not specified as recurrent: Secondary | ICD-10-CM | POA: Diagnosis not present

## 2016-12-30 NOTE — Patient Instructions (Signed)

## 2016-12-30 NOTE — Progress Notes (Signed)
William Burns; [id number]; [birth year]   HPI  patient is a 71 year old white male who was referred to my care by oncology and Dr. Nevada Crane for evaluation and treatment of a right inguinal hernia.  He states that has been present for a few months.  It only occasionally bothers him.  He currently has no pain.  He denies any nausea or vomiting.  It is not currently affecting his daily routine. Past Medical History:  Diagnosis Date  . BPH (benign prostatic hyperplasia)   . Cancer ([place])    colon  . Cataract   . Colonic mass 07/03/2011  . Difficult intubation    extra long tube for intub per wife and slow to wake up   . Erosive gastritis 07/03/2011  . GERD (gastroesophageal reflux disease)   . Kidney stones 2011  . Renal calculus   . Tooth infection 07/03/2011    Past Surgical History:  Procedure Laterality Date  . CATARACT EXTRACTION W/PHACO Left 08/23/2012   Procedure: CATARACT EXTRACTION PHACO AND INTRAOCULAR LENS PLACEMENT (IOC);  Surgeon: Tonny Branch, MD;  [facility];  Service: Ophthalmology;  Laterality: Left;  CDE 16.64  . CATARACT EXTRACTION W/PHACO Right 06/09/2013   Procedure: CATARACT EXTRACTION PHACO AND INTRAOCULAR LENS PLACEMENT (IOC) CDE=10.86;  Surgeon: Tonny Branch, MD;  [facility]: [facility];  Service: Ophthalmology;  Laterality: Right;  . COLON SURGERY    . COLONOSCOPY  07/03/2011   Procedure: COLONOSCOPY;  Surgeon: Rogene Houston, MD;  [facility];  Service: Endoscopy;  Laterality: N/A;  . COLONOSCOPY N/A 09/16/2012   Procedure: COLONOSCOPY;  Surgeon: Rogene Houston, MD;  [facility];  Service: Endoscopy;  Laterality: N/A;  930  . COLONOSCOPY N/A 12/05/2015   Procedure: COLONOSCOPY;  Surgeon: Rogene Houston, MD;  [facility];  Service: Endoscopy;  Laterality: N/A;  1200  . CYSTOSCOPY  07/09/2011   Procedure: CYSTOSCOPY FLEXIBLE;  Surgeon: Marissa Nestle, MD;  [facility];  Service: Urology;  Laterality: N/A;  . CYSTOSCOPY WITH LITHOLAPAXY   07/11/2011   Procedure: CYSTOSCOPY WITH LITHOLAPAXY;  Surgeon: Marissa Nestle, MD;  [facility];  Service: Urology;  Laterality: N/A;  . ESOPHAGOGASTRODUODENOSCOPY  07/03/2011   Procedure: ESOPHAGOGASTRODUODENOSCOPY (EGD);  Surgeon: Rogene Houston, MD;  [facility];  Service: Endoscopy;  Laterality: N/A;  . HERNIA REPAIR  [id number]    umbilical   . PARTIAL COLECTOMY  07/04/2011   Procedure: PARTIAL COLECTOMY;  Surgeon: Jamesetta So, MD;  [facility]: [facility];  Service: General;  Laterality: N/A;  . Prostage cancer    . PROSTATE BIOPSY    . ROBOT ASSISTED LAPAROSCOPIC RADICAL PROSTATECTOMY Bilateral 01/19/2015   Procedure: ROBOTIC ASSISTED LAPAROSCOPIC RADICAL PROSTATECTOMY AND BILATERAL PELVIC LYMPH NODE DISSECTION, LAPARASCOPIC  LYSIS OF ADHESIONS;  Surgeon: Ardis Hughs, MD;  Location: [facility];  Service: Urology;  Laterality: Bilateral;  . TRANSURETHRAL RESECTION OF PROSTATE  07/11/2011   Procedure: TRANSURETHRAL RESECTION OF THE PROSTATE (TURP);  Surgeon: Marissa Nestle, MD;  [facility]: [facility];  Service: Urology;  Laterality: N/A;    Family History  Problem Relation Age of Onset  . Alzheimer's disease Mother   . Alzheimer's disease Father   . Aneurysm Brother        brain  . Anuerysm Brother   . Healthy Son   . Healthy Son   . Colon cancer Neg Hx     Current Outpatient Prescriptions on File Prior to Visit  Medication Sig  Dispense Refill  . Fish Oil OIL by Does not apply route.    . Multiple Vitamins-Minerals (MULTIVITAMIN MEN PO) Take by mouth.    . pantoprazole (PROTONIX) 40 MG tablet TAKE 1 TABLET BY MOUTH ONCE DAILY. 90 tablet 3  . Probiotic CAPS Take 1 capsule by mouth every morning. Taking PB8     No current facility-administered medications on file prior to visit.     No Known Allergies  History  Alcohol Use No    History  Smoking Status  . Former Smoker  . Packs/day: 2.00  . Years: 25.00  . Types: Cigarettes  . Quit date: 06/02/1989  Smokeless  Tobacco  . Never Used    Review of Systems  Constitutional: Negative.   HENT: Negative.   Eyes: Negative.   Respiratory: Negative.   Cardiovascular: Negative.   Gastrointestinal: Negative.   Genitourinary: Negative.   Musculoskeletal: Negative.   Skin: Negative.   Neurological: Negative.   Endo/Heme/Allergies: Negative.   Psychiatric/Behavioral: Negative.     Objective   Vitals:   12/30/16 1450  BP: 130/72  Pulse: 74  Resp: 18  Temp: 98.7 F (37.1 C)    Physical Exam  Constitutional: He is oriented to person, place, and time and well-developed, well-nourished, and in no distress.  HENT:  Head: Normocephalic and atraumatic.  Neck: Normal range of motion. Neck supple.  Cardiovascular: Normal rate, regular rhythm and normal heart sounds.   No murmur heard. Pulmonary/Chest: Effort normal and breath sounds normal. He has no wheezes. He has no rales.  Abdominal: Soft. Bowel sounds are normal. He exhibits no distension. There is no tenderness.  Easily reducible right inguinal hernia  Neurological: He is alert and oriented to person, place, and time.  Skin: Skin is warm and dry.  Vitals reviewed.   Assessment    Right inguinal hernia, currently asymptomatic Plan     Told the patient that he currently does not need surgical repair and he is fine with this.  I did discuss the technique with the patient.  He does know that if he becomes more symptomatic, he should return to my office.  He understands and agrees.  Follow up as needed.

## 2017-03-23 DIAGNOSIS — N5201 Erectile dysfunction due to arterial insufficiency: Secondary | ICD-10-CM | POA: Diagnosis not present

## 2017-03-23 DIAGNOSIS — N393 Stress incontinence (female) (male): Secondary | ICD-10-CM | POA: Diagnosis not present

## 2017-03-23 DIAGNOSIS — Z8546 Personal history of malignant neoplasm of prostate: Secondary | ICD-10-CM | POA: Diagnosis not present

## 2017-04-08 DIAGNOSIS — H43811 Vitreous degeneration, right eye: Secondary | ICD-10-CM | POA: Diagnosis not present

## 2017-04-08 DIAGNOSIS — H11153 Pinguecula, bilateral: Secondary | ICD-10-CM | POA: Diagnosis not present

## 2017-04-30 ENCOUNTER — Encounter (HOSPITAL_COMMUNITY): Payer: Self-pay

## 2017-04-30 ENCOUNTER — Encounter (HOSPITAL_COMMUNITY)
Admission: RE | Admit: 2017-04-30 | Discharge: 2017-04-30 | Disposition: A | Discharge status: Home / Self Care | Payer: Medicare Other | Source: Ambulatory Visit | Attending: General Surgery | Admitting: General Surgery

## 2017-04-30 ENCOUNTER — Ambulatory Visit (INDEPENDENT_AMBULATORY_CARE_PROVIDER_SITE_OTHER): Payer: Medicare Other | Admitting: General Surgery

## 2017-04-30 ENCOUNTER — Other Ambulatory Visit: Payer: Self-pay

## 2017-04-30 ENCOUNTER — Encounter: Payer: Self-pay | Admitting: General Surgery

## 2017-04-30 VITALS — BP 148/69 | HR 75 | Temp 98.9°F | Resp 18 | Ht 75.0 in | Wt 239.0 lb

## 2017-04-30 DIAGNOSIS — Z01818 Encounter for other preprocedural examination: Secondary | ICD-10-CM | POA: Insufficient documentation

## 2017-04-30 DIAGNOSIS — K4090 Unilateral inguinal hernia, without obstruction or gangrene, not specified as recurrent: Secondary | ICD-10-CM

## 2017-04-30 DIAGNOSIS — R9431 Abnormal electrocardiogram [ECG] [EKG]: Secondary | ICD-10-CM | POA: Diagnosis not present

## 2017-04-30 HISTORY — DX: Personal history of urinary calculi: Z87.442

## 2017-04-30 LAB — CBC WITH DIFFERENTIAL/PLATELET
Basophils Absolute: 0 K/uL (ref 0.0–0.1)
Basophils Relative: 0 %
Eosinophils Absolute: 0.1 K/uL (ref 0.0–0.7)
Eosinophils Relative: 1 %
HCT: 44.7 % (ref 39.0–52.0)
Hemoglobin: 14.5 g/dL (ref 13.0–17.0)
Lymphocytes Relative: 16 %
Lymphs Abs: 1.6 K/uL (ref 0.7–4.0)
MCH: 29.6 pg (ref 26.0–34.0)
MCHC: 32.4 g/dL (ref 30.0–36.0)
MCV: 91.2 fL (ref 78.0–100.0)
Monocytes Absolute: 0.7 K/uL (ref 0.1–1.0)
Monocytes Relative: 7 %
Neutro Abs: 7.6 K/uL (ref 1.7–7.7)
Neutrophils Relative %: 76 %
Platelets: 267 K/uL (ref 150–400)
RBC: 4.9 MIL/uL (ref 4.22–5.81)
RDW: 13.6 % (ref 11.5–15.5)
WBC: 10 K/uL (ref 4.0–10.5)

## 2017-04-30 LAB — BASIC METABOLIC PANEL
Anion gap: 8 (ref 5–15)
BUN: 17 mg/dL (ref 6–20)
CALCIUM: 8.9 mg/dL (ref 8.9–10.3)
CO2: 26 mmol/L (ref 22–32)
CREATININE: 0.92 mg/dL (ref 0.61–1.24)
Chloride: 105 mmol/L (ref 101–111)
GFR calc Af Amer: >60 mL/min (ref >60)
GLUCOSE: 132 mg/dL — AB (ref 65–99)
POTASSIUM: 3.5 mmol/L (ref 3.5–5.1)
SODIUM: 139 mmol/L (ref 135–145)

## 2017-04-30 NOTE — H&P (Signed)
William Burns; [id number]; 1945-08-06   HPI Patient is a 71 year old white male who I saw on July of this year for an asymptomatic right inguinal hernia.  He now presents with a 3-day history of worsening right groin pain.  He has had increased swelling in the right inguinal region.  It does reduce when lying down.  He currently has a pain level of 5 out of 10.  He would like to proceed with repair.  He also is having some diarrhea of unknown etiology.  He denies any fever or chills. Past Medical History:  Diagnosis Date  . BPH (benign prostatic hyperplasia)   . Cancer ([place])    colon  . Cataract   . Colonic mass 07/03/2011  . Difficult intubation    extra long tube for intub per wife and slow to wake up   . Erosive gastritis 07/03/2011  . GERD (gastroesophageal reflux disease)   . Kidney stones 2011  . Renal calculus   . Tooth infection 07/03/2011    Past Surgical History:  Procedure Laterality Date  . CATARACT EXTRACTION W/PHACO Left 08/23/2012   Procedure: CATARACT EXTRACTION PHACO AND INTRAOCULAR LENS PLACEMENT (IOC);  Surgeon: Tonny Branch, MD;  Location: [facility];  Service: Ophthalmology;  Laterality: Left;  CDE 16.64  . CATARACT EXTRACTION W/PHACO Right 06/09/2013   Procedure: CATARACT EXTRACTION PHACO AND INTRAOCULAR LENS PLACEMENT (IOC) CDE=10.86;  Surgeon: Tonny Branch, MD;  Location: [facility];  Service: Ophthalmology;  Laterality: Right;  . COLON SURGERY    . COLONOSCOPY  07/03/2011   Procedure: COLONOSCOPY;  Surgeon: Rogene Houston, MD;  [facility];  Service: Endoscopy;  Laterality: N/A;  . COLONOSCOPY N/A 09/16/2012   Procedure: COLONOSCOPY;  Surgeon: Rogene Houston, MD;  [facility];  Service: Endoscopy;  Laterality: N/A;  930  . COLONOSCOPY N/A 12/05/2015   Procedure: COLONOSCOPY;  Surgeon: Rogene Houston, MD;  [facility];  Service: Endoscopy;  Laterality: N/A;  1200  . CYSTOSCOPY  07/09/2011   Procedure: CYSTOSCOPY FLEXIBLE;  Surgeon: Marissa Nestle, MD;  Location: [facility];  Service: Urology;  Laterality: N/A;  . CYSTOSCOPY WITH LITHOLAPAXY  07/11/2011   Procedure: CYSTOSCOPY WITH LITHOLAPAXY;  Surgeon: Marissa Nestle, MD;  Location: [facility];  Service: Urology;  Laterality: N/A;  . ESOPHAGOGASTRODUODENOSCOPY  07/03/2011   Procedure: ESOPHAGOGASTRODUODENOSCOPY (EGD);  Surgeon: Rogene Houston, MD;  [facility];  Service: Endoscopy;  Laterality: N/A;  . HERNIA REPAIR  [id number]    umbilical   . PARTIAL COLECTOMY  07/04/2011   Procedure: PARTIAL COLECTOMY;  Surgeon: Jamesetta So, MD;  Location: [facility];  Service: General;  Laterality: N/A;  . Prostage cancer    . PROSTATE BIOPSY    . ROBOT ASSISTED LAPAROSCOPIC RADICAL PROSTATECTOMY Bilateral 01/19/2015   Procedure: ROBOTIC ASSISTED LAPAROSCOPIC RADICAL PROSTATECTOMY AND BILATERAL PELVIC LYMPH NODE DISSECTION, LAPARASCOPIC  LYSIS OF ADHESIONS;  Surgeon: Ardis Hughs, MD;  Location: [facility];  Service: Urology;  Laterality: Bilateral;  . TRANSURETHRAL RESECTION OF PROSTATE  07/11/2011   Procedure: TRANSURETHRAL RESECTION OF THE PROSTATE (TURP);  Surgeon: Marissa Nestle, MD;  Location: [facility];  Service: Urology;  Laterality: N/A;    Family History  Problem Relation Age of Onset  . Alzheimer's disease Mother   . Alzheimer's disease Father   . Aneurysm Brother        brain  . Anuerysm Brother   . Healthy Son   . Healthy Son   .  Colon cancer Neg Hx     Current Outpatient Medications on File Prior to Visit  Medication Sig Dispense Refill  . Fish Oil OIL by Does not apply route.    . Multiple Vitamins-Minerals (MULTIVITAMIN MEN PO) Take by mouth.    . pantoprazole (PROTONIX) 40 MG tablet TAKE 1 TABLET BY MOUTH ONCE DAILY. 90 tablet 3  . Probiotic CAPS Take 1 capsule by mouth every morning. Taking PB8     No current facility-administered medications on file prior to visit.     No Known Allergies  Social History   Substance and Sexual Activity  Alcohol Use No     Social History   Tobacco Use  Smoking Status Former Smoker  . Packs/day: 2.00  . Years: 25.00  . Pack years: 50.00  . Types: Cigarettes  . Last attempt to quit: 06/02/1989  . Years since quitting: 27.9  Smokeless Tobacco Never Used    Review of Systems  Constitutional: Negative.   HENT: Negative.   Eyes: Negative.   Respiratory: Negative.   Cardiovascular: Negative.   Gastrointestinal: Positive for abdominal pain and diarrhea.  Genitourinary: Negative.   Musculoskeletal: Negative.   Skin: Negative.   Neurological: Negative.   Endo/Heme/Allergies: Negative.   Psychiatric/Behavioral: Negative.     Objective   Vitals:   04/30/17 1144  BP: (!) 148/69  Pulse: 75  Resp: 18  Temp: 98.9 F (37.2 C)    Physical Exam  Constitutional: He is oriented to person, place, and time and well-developed, well-nourished, and in no distress.  HENT:  Head: Normocephalic and atraumatic.  Cardiovascular: Normal rate, regular rhythm and normal heart sounds. Exam reveals no gallop and no friction rub.  No murmur heard. Pulmonary/Chest: Effort normal and breath sounds normal. No respiratory distress. He has no wheezes. He has no rales.  Abdominal: Soft. Bowel sounds are normal. He exhibits no distension. There is no tenderness. There is no rebound.  Easily reducible right inguinal hernia.  Genitourinary:  Genitourinary Comments: Genitourinary examination within normal limits.  Neurological: He is alert and oriented to person, place, and time.  Skin: Skin is warm and dry.  Vitals reviewed. Previous office visit notes reviewed.  Assessment  Right inguinal hernia Plan   Patient is scheduled for right inguinal herniorrhaphy with mesh on 05/04/2017.  The risks and benefits of the procedure including bleeding, infection, mesh use, and the possibility of recurrence of the hernia were fully explained to the patient, who gave informed consent.  Patient may take Imodium AD for his  diarrhea.

## 2017-04-30 NOTE — Patient Instructions (Signed)

## 2017-04-30 NOTE — Progress Notes (Signed)
William Burns; [id number]; [birth year]   HPI Patient is a 71 year old white male who I saw on July of this year for an asymptomatic right inguinal hernia.  He now presents with a 3-day history of worsening right groin pain.  He has had increased swelling in the right inguinal region.  It does reduce when lying down.  He currently has a pain level of 5 out of 10.  He would like to proceed with repair.  He also is having some diarrhea of unknown etiology.  He denies any fever or chills. Past Medical History:  Diagnosis Date  . BPH (benign prostatic hyperplasia)   . Cancer ([place])    colon  . Cataract   . Colonic mass 07/03/2011  . Difficult intubation    extra long tube for intub per wife and slow to wake up   . Erosive gastritis 07/03/2011  . GERD (gastroesophageal reflux disease)   . Kidney stones 2011  . Renal calculus   . Tooth infection 07/03/2011    Past Surgical History:  Procedure Laterality Date  . CATARACT EXTRACTION W/PHACO Left 08/23/2012   Procedure: CATARACT EXTRACTION PHACO AND INTRAOCULAR LENS PLACEMENT (IOC);  Surgeon: Tonny Branch, MD;  Location: [facility];  Service: Ophthalmology;  Laterality: Left;  CDE 16.64  . CATARACT EXTRACTION W/PHACO Right 06/09/2013   Procedure: CATARACT EXTRACTION PHACO AND INTRAOCULAR LENS PLACEMENT (IOC) CDE=10.86;  Surgeon: Tonny Branch, MD;  Location: [facility];  Service: Ophthalmology;  Laterality: Right;  . COLON SURGERY    . COLONOSCOPY  07/03/2011   Procedure: COLONOSCOPY;  Surgeon: Rogene Houston, MD;  [facility];  Service: Endoscopy;  Laterality: N/A;  . COLONOSCOPY N/A 09/16/2012   Procedure: COLONOSCOPY;  Surgeon: Rogene Houston, MD;  [facility];  Service: Endoscopy;  Laterality: N/A;  930  . COLONOSCOPY N/A 12/05/2015   Procedure: COLONOSCOPY;  Surgeon: Rogene Houston, MD;  [facility];  Service: Endoscopy;  Laterality: N/A;  1200  . CYSTOSCOPY  07/09/2011   Procedure: CYSTOSCOPY FLEXIBLE;  Surgeon: Marissa Nestle, MD;  [facility];  Service: Urology;  Laterality: N/A;  . CYSTOSCOPY WITH LITHOLAPAXY  07/11/2011   Procedure: CYSTOSCOPY WITH LITHOLAPAXY;  Surgeon: Marissa Nestle, MD;  Location: [facility];  Service: Urology;  Laterality: N/A;  . ESOPHAGOGASTRODUODENOSCOPY  07/03/2011   Procedure: ESOPHAGOGASTRODUODENOSCOPY (EGD);  Surgeon: Rogene Houston, MD;  [facility];  Service: Endoscopy;  Laterality: N/A;  . HERNIA REPAIR  [id number]    umbilical   . PARTIAL COLECTOMY  07/04/2011   Procedure: PARTIAL COLECTOMY;  Surgeon: Jamesetta So, MD;  Location: [facility];  Service: General;  Laterality: N/A;  . Prostage cancer    . PROSTATE BIOPSY    . ROBOT ASSISTED LAPAROSCOPIC RADICAL PROSTATECTOMY Bilateral 01/19/2015   Procedure: ROBOTIC ASSISTED LAPAROSCOPIC RADICAL PROSTATECTOMY AND BILATERAL PELVIC LYMPH NODE DISSECTION, LAPARASCOPIC  LYSIS OF ADHESIONS;  Surgeon: Ardis Hughs, MD;  Location: [facility];  Service: Urology;  Laterality: Bilateral;  . TRANSURETHRAL RESECTION OF PROSTATE  07/11/2011   Procedure: TRANSURETHRAL RESECTION OF THE PROSTATE (TURP);  Surgeon: Marissa Nestle, MD;  Location: [facility];  Service: Urology;  Laterality: N/A;    Family History  Problem Relation Age of Onset  . Alzheimer's disease Mother   . Alzheimer's disease Father   . Aneurysm Brother        brain  . Anuerysm Brother   . Healthy Son   . Healthy Son   .  Colon cancer Neg Hx     Current Outpatient Medications on File Prior to Visit  Medication Sig Dispense Refill  . Fish Oil OIL by Does not apply route.    . Multiple Vitamins-Minerals (MULTIVITAMIN MEN PO) Take by mouth.    . pantoprazole (PROTONIX) 40 MG tablet TAKE 1 TABLET BY MOUTH ONCE DAILY. 90 tablet 3  . Probiotic CAPS Take 1 capsule by mouth every morning. Taking PB8     No current facility-administered medications on file prior to visit.     No Known Allergies  Social History   Substance and Sexual Activity  Alcohol Use No     Social History   Tobacco Use  Smoking Status Former Smoker  . Packs/day: 2.00  . Years: 25.00  . Pack years: 50.00  . Types: Cigarettes  . Last attempt to quit: 06/02/1989  . Years since quitting: 27.9  Smokeless Tobacco Never Used    Review of Systems  Constitutional: Negative.   HENT: Negative.   Eyes: Negative.   Respiratory: Negative.   Cardiovascular: Negative.   Gastrointestinal: Positive for abdominal pain and diarrhea.  Genitourinary: Negative.   Musculoskeletal: Negative.   Skin: Negative.   Neurological: Negative.   Endo/Heme/Allergies: Negative.   Psychiatric/Behavioral: Negative.     Objective   Vitals:   04/30/17 1144  BP: (!) 148/69  Pulse: 75  Resp: 18  Temp: 98.9 F (37.2 C)    Physical Exam  Constitutional: He is oriented to person, place, and time and well-developed, well-nourished, and in no distress.  HENT:  Head: Normocephalic and atraumatic.  Cardiovascular: Normal rate, regular rhythm and normal heart sounds. Exam reveals no gallop and no friction rub.  No murmur heard. Pulmonary/Chest: Effort normal and breath sounds normal. No respiratory distress. He has no wheezes. He has no rales.  Abdominal: Soft. Bowel sounds are normal. He exhibits no distension. There is no tenderness. There is no rebound.  Easily reducible right inguinal hernia.  Genitourinary:  Genitourinary Comments: Genitourinary examination within normal limits.  Neurological: He is alert and oriented to person, place, and time.  Skin: Skin is warm and dry.  Vitals reviewed. Previous office visit notes reviewed.  Assessment  Right inguinal hernia Plan   Patient is scheduled for right inguinal herniorrhaphy with mesh on 05/04/2017.  The risks and benefits of the procedure including bleeding, infection, mesh use, and the possibility of recurrence of the hernia were fully explained to the patient, who gave informed consent.  Patient may take Imodium AD for his diarrhea.

## 2017-04-30 NOTE — Progress Notes (Signed)
   04/30/17 1445  OBSTRUCTIVE SLEEP APNEA  Have you ever been diagnosed with sleep apnea through a sleep study? No  Do you snore loudly (loud enough to be heard through closed doors)?  1  Do you often feel tired, fatigued, or sleepy during the daytime (such as falling asleep during driving or talking to someone)? 1  Has anyone observed you stop breathing during your sleep? 1  Do you have, or are you being treated for high blood pressure? 0  BMI more than 35 kg/m2? 0  Age > 50 (1-yes) 1  Neck circumference greater than:Male 16 inches or larger, Male 17inches or larger? 1  Male Gender (Yes=1) 1  Obstructive Sleep Apnea Score 6  Score 5 or greater  Results sent to PCP

## 2017-04-30 NOTE — Patient Instructions (Signed)
William Burns  04/30/2017     @PREFPERIOPPHARMACY @   Your procedure is scheduled on  05/04/2017   Report to [facility] at  925   A.M.  Call this number if you have problems the morning of surgery:  [phone]   Remember:  Do not eat food or drink liquids after midnight.  Take these medicines the morning of surgery with A SIP OF WATER  protonix.   Do not wear jewelry, make-up or nail polish.  Do not wear lotions, powders, or perfumes, or deoderant.  Do not shave 48 hours prior to surgery.  Men may shave face and neck.  Do not bring valuables to the hospital.  [facility] is not responsible for any belongings or valuables.  Contacts, dentures or bridgework may not be worn into surgery.  Leave your suitcase in the car.  After surgery it may be brought to your room.  For patients admitted to the hospital, discharge time will be determined by your treatment team.  Patients discharged the day of surgery will not be allowed to drive home.   Name and phone number of your driver:   family Special instructions:  None  Please read over the following fact sheets that you were given. Anesthesia Post-op Instructions and Care and Recovery After Surgery       Open Hernia Repair, Adult Open hernia repair is a surgical procedure to fix a hernia. A hernia occurs when an internal organ or tissue pushes out through a weak spot in the abdominal wall muscles. Hernias commonly occur in the groin and around the navel. Most hernias tend to get worse over time. Often, surgery is done to prevent the hernia from becoming bigger, uncomfortable, or an emergency. Emergency surgery may be needed if abdominal contents get stuck in the opening (incarcerated hernia) or the blood supply gets cut off (strangulated hernia). In an open repair, an incision is made in the abdomen to perform the surgery. Tell a health care provider about:  Any allergies you have.  All medicines you are taking,  including vitamins, herbs, eye drops, creams, and over-the-counter medicines.  Any problems you or family members have had with anesthetic medicines.  Any blood or bone disorders you have.  Any surgeries you have had.  Any medical conditions you have, including any recent cold or flu symptoms.  Whether you are pregnant or may be pregnant. What are the risks? Generally, this is a safe procedure. However, problems may occur, including:  Long-lasting (chronic) pain.  Bleeding.  Infection.  Damage to the testicle. This can cause shrinking or swelling.  Damage to the bladder, blood vessels, intestine, or nerves near the hernia.  Trouble passing urine.  Allergic reactions to medicines.  Return of the hernia.  What happens before the procedure? Staying hydrated Follow instructions from your health care provider about hydration, which may include:  Up to 2 hours before the procedure - you may continue to drink clear liquids, such as water, clear fruit juice, black coffee, and plain tea.  Eating and drinking restrictions Follow instructions from your health care provider about eating and drinking, which may include:  8 hours before the procedure - stop eating heavy meals or foods such as meat, fried foods, or fatty foods.  6 hours before the procedure - stop eating light meals or foods, such as toast or cereal.  6 hours before the procedure - stop drinking milk or drinks that  contain milk.  2 hours before the procedure - stop drinking clear liquids.  Medicines  Ask your health care provider about: ? Changing or stopping your regular medicines. This is especially important if you are taking diabetes medicines or blood thinners. ? Taking medicines such as aspirin and ibuprofen. These medicines can thin your blood. Do not take these medicines before your procedure if your health care provider instructs you not to.  You may be given antibiotic medicine to help prevent  infection. General instructions  You may have blood tests or imaging studies.  Ask your health care provider how your surgical site will be marked or identified.  If you smoke, do not smoke for at least 2 weeks before your procedure or for as long as told by your health care provider.  Let your health care provider know if you develop a cold or any infection before your surgery.  Plan to have someone take you home from the hospital or clinic.  If you will be going home right after the procedure, plan to have someone with you for 24 hours. What happens during the procedure?  To reduce your risk of infection: ? Your health care team will wash or sanitize their hands. ? Your skin will be washed with soap. ? Hair may be removed from the surgical area.  An IV tube will be inserted into one of your veins.  You will be given one or more of the following: ? A medicine to help you relax (sedative). ? A medicine to numb the area (local anesthetic). ? A medicine to make you fall asleep (general anesthetic).  Your surgeon will make an incision over the hernia.  The tissues of the hernia will be moved back into place.  The edges of the hernia may be stitched together.  The opening in the abdominal muscles will be closed with stitches (sutures). Or, your surgeon will place a mesh patch made of manmade (synthetic) material over the opening.  The incision will be closed.  A bandage (dressing) may be placed over the incision. The procedure may vary among health care providers and hospitals. What happens after the procedure?  Your blood pressure, heart rate, breathing rate, and blood oxygen level will be monitored until the medicines you were given have worn off.  You may be given medicine for pain.  Do not drive for 24 hours if you received a sedative. This information is not intended to replace advice given to you by your health care provider. Make sure you discuss any questions you  have with your health care provider. Document Released: 11/12/2000 Document Revised: 12/07/2015 Document Reviewed: 10/31/2015 Elsevier Interactive Patient Education  2018 [place], Adult, Care After These instructions give you information about caring for yourself after your procedure. Your doctor may also give you more specific instructions. If you have problems or questions, contact your doctor. Follow these instructions at home: Surgical cut (incision) care   Follow instructions from your doctor about how to take care of your surgical cut area. Make sure you: ? Wash your hands with soap and water before you change your bandage (dressing). If you cannot use soap and water, use hand sanitizer. ? Change your bandage as told by your doctor. ? Leave stitches (sutures), skin glue, or skin tape (adhesive) strips in place. They may need to stay in place for 2 weeks or longer. If tape strips get loose and curl up, you may trim the loose edges. Do  not remove tape strips completely unless your doctor says it is okay.  Check your surgical cut every day for signs of infection. Check for: ? More redness, swelling, or pain. ? More fluid or blood. ? Warmth. ? Pus or a bad smell. Activity  Do not drive or use heavy machinery while taking prescription pain medicine. Do not drive until your doctor says it is okay.  Until your doctor says it is okay: ? Do not lift anything that is heavier than 10 lb (4.5 kg). ? Do not play contact sports.  Return to your normal activities as told by your doctor. Ask your doctor what activities are safe. General instructions  To prevent or treat having a hard time pooping (constipation) while you are taking prescription pain medicine, your doctor may recommend that you: ? Drink enough fluid to keep your pee (urine) clear or pale yellow. ? Take over-the-counter or prescription medicines. ? Eat foods that are high in fiber, such as fresh  fruits and vegetables, whole grains, and beans. ? Limit foods that are high in fat and processed sugars, such as fried and sweet foods.  Take over-the-counter and prescription medicines only as told by your doctor.  Do not take baths, swim, or use a hot tub until your doctor says it is okay.  Keep all follow-up visits as told by your doctor. This is important. Contact a doctor if:  You develop a rash.  You have more redness, swelling, or pain around your surgical cut.  You have more fluid or blood coming from your surgical cut.  Your surgical cut feels warm to the touch.  You have pus or a bad smell coming from your surgical cut.  You have a fever or chills.  You have blood in your poop (stool).  You have not pooped in 2-3 days.  Medicine does not help your pain. Get help right away if:  You have chest pain or you are short of breath.  You feel light-headed.  You feel weak and dizzy (feel faint).  You have very bad pain.  You throw up (vomit) and your pain is worse. This information is not intended to replace advice given to you by your health care provider. Make sure you discuss any questions you have with your health care provider. Document Released: 06/09/2014 Document Revised: 12/07/2015 Document Reviewed: 10/31/2015 Elsevier Interactive Patient Education  2017 [place] Anesthesia, Adult General anesthesia is the use of medicines to make a person "go to sleep" (be unconscious) for a medical procedure. General anesthesia is often recommended when a procedure:  Is long.  Requires you to be still or in an unusual position.  Is major and can cause you to lose blood.  Is impossible to do without general anesthesia.  The medicines used for general anesthesia are called general anesthetics. In addition to making you sleep, the medicines:  Prevent pain.  Control your blood pressure.  Relax your muscles.  Tell a health care provider about:  Any  allergies you have.  All medicines you are taking, including vitamins, herbs, eye drops, creams, and over-the-counter medicines.  Any problems you or family members have had with anesthetic medicines.  Types of anesthetics you have had in the past.  Any bleeding disorders you have.  Any surgeries you have had.  Any medical conditions you have.  Any history of heart or lung conditions, such as heart failure, sleep apnea, or chronic obstructive pulmonary disease (COPD).  Whether you are pregnant  or may be pregnant.  Whether you use tobacco, alcohol, marijuana, or street drugs.  Any history of Armed forces logistics/support/administrative officer.  Any history of depression or anxiety. What are the risks? Generally, this is a safe procedure. However, problems may occur, including:  Allergic reaction to anesthetics.  Lung and heart problems.  Inhaling food or liquids from your stomach into your lungs (aspiration).  Injury to nerves.  Waking up during your procedure and being unable to move (rare).  Extreme agitation or a state of mental confusion (delirium) when you wake up from the anesthetic.  Air in the bloodstream, which can lead to stroke.  These problems are more likely to develop if you are having a major surgery or if you have an advanced medical condition. You can prevent some of these complications by answering all of your health care provider's questions thoroughly and by following all pre-procedure instructions. General anesthesia can cause side effects, including:  Nausea or vomiting  A sore throat from the breathing tube.  Feeling cold or shivery.  Feeling tired, washed out, or achy.  Sleepiness or drowsiness.  Confusion or agitation.  What happens before the procedure? Staying hydrated Follow instructions from your health care provider about hydration, which may include:  Up to 2 hours before the procedure - you may continue to drink clear liquids, such as water, clear fruit juice,  black coffee, and plain tea.  Eating and drinking restrictions Follow instructions from your health care provider about eating and drinking, which may include:  8 hours before the procedure - stop eating heavy meals or foods such as meat, fried foods, or fatty foods.  6 hours before the procedure - stop eating light meals or foods, such as toast or cereal.  6 hours before the procedure - stop drinking milk or drinks that contain milk.  2 hours before the procedure - stop drinking clear liquids.  Medicines  Ask your health care provider about: ? Changing or stopping your regular medicines. This is especially important if you are taking diabetes medicines or blood thinners. ? Taking medicines such as aspirin and ibuprofen. These medicines can thin your blood. Do not take these medicines before your procedure if your health care provider instructs you not to. ? Taking new dietary supplements or medicines. Do not take these during the week before your procedure unless your health care provider approves them.  If you are told to take a medicine or to continue taking a medicine on the day of the procedure, take the medicine with sips of water. General instructions   Ask if you will be going home the same day, the following day, or after a longer hospital stay. ? Plan to have someone take you home. ? Plan to have someone stay with you for the first 24 hours after you leave the hospital or clinic.  For 3-6 weeks before the procedure, try not to use any tobacco products, such as cigarettes, chewing tobacco, and e-cigarettes.  You may brush your teeth on the morning of the procedure, but make sure to spit out the toothpaste. What happens during the procedure?  You will be given anesthetics through a mask and through an IV tube in one of your veins.  You may receive medicine to help you relax (sedative).  As soon as you are asleep, a breathing tube may be used to help you breathe.  An  anesthesia specialist will stay with you throughout the procedure. He or she will help keep you comfortable and  safe by continuing to give you medicines and adjusting the amount of medicine that you get. He or she will also watch your blood pressure, pulse, and oxygen levels to make sure that the anesthetics do not cause any problems.  If a breathing tube was used to help you breathe, it will be removed before you wake up. The procedure may vary among health care providers and hospitals. What happens after the procedure?  You will wake up, often slowly, after the procedure is complete, usually in a recovery area.  Your blood pressure, heart rate, breathing rate, and blood oxygen level will be monitored until the medicines you were given have worn off.  You may be given medicine to help you calm down if you feel anxious or agitated.  If you will be going home the same day, your health care provider may check to make sure you can stand, drink, and urinate.  Your health care providers will treat your pain and side effects before you go home.  Do not drive for 24 hours if you received a sedative.  You may: ? Feel nauseous and vomit. ? Have a sore throat. ? Have mental slowness. ? Feel cold or shivery. ? Feel sleepy. ? Feel tired. ? Feel sore or achy, even in parts of your body where you did not have surgery. This information is not intended to replace advice given to you by your health care provider. Make sure you discuss any questions you have with your health care provider. Document Released: 08/26/2007 Document Revised: 10/30/2015 Document Reviewed: 05/03/2015 Elsevier Interactive Patient Education  2018 [place] Anesthesia, Adult, Care After These instructions provide you with information about caring for yourself after your procedure. Your health care provider may also give you more specific instructions. Your treatment has been planned according to current medical  practices, but problems sometimes occur. Call your health care provider if you have any problems or questions after your procedure. What can I expect after the procedure? After the procedure, it is common to have:  Vomiting.  A sore throat.  Mental slowness.  It is common to feel:  Nauseous.  Cold or shivery.  Sleepy.  Tired.  Sore or achy, even in parts of your body where you did not have surgery.  Follow these instructions at home: For at least 24 hours after the procedure:  Do not: ? Participate in activities where you could fall or become injured. ? Drive. ? Use heavy machinery. ? Drink alcohol. ? Take sleeping pills or medicines that cause drowsiness. ? Make important decisions or sign legal documents. ? Take care of children on your own.  Rest. Eating and drinking  If you vomit, drink water, juice, or soup when you can drink without vomiting.  Drink enough fluid to keep your urine clear or pale yellow.  Make sure you have little or no nausea before eating solid foods.  Follow the diet recommended by your health care provider. General instructions  Have a responsible adult stay with you until you are awake and alert.  Return to your normal activities as told by your health care provider. Ask your health care provider what activities are safe for you.  Take over-the-counter and prescription medicines only as told by your health care provider.  If you smoke, do not smoke without supervision.  Keep all follow-up visits as told by your health care provider. This is important. Contact a health care provider if:  You continue to have nausea or vomiting  at home, and medicines are not helpful.  You cannot drink fluids or start eating again.  You cannot urinate after 8-12 hours.  You develop a skin rash.  You have fever.  You have increasing redness at the site of your procedure. Get help right away if:  You have difficulty breathing.  You have  chest pain.  You have unexpected bleeding.  You feel that you are having a life-threatening or urgent problem. This information is not intended to replace advice given to you by your health care provider. Make sure you discuss any questions you have with your health care provider. Document Released: 08/25/2000 Document Revised: 10/22/2015 Document Reviewed: 05/03/2015 Elsevier Interactive Patient Education  [employer].

## 2017-05-04 ENCOUNTER — Ambulatory Visit (HOSPITAL_COMMUNITY)
Admission: RE | Admit: 2017-05-04 | Discharge: 2017-05-04 | Disposition: A | Discharge status: Home / Self Care | Payer: Medicare Other | Source: Ambulatory Visit | Attending: General Surgery | Admitting: General Surgery

## 2017-05-04 ENCOUNTER — Encounter (HOSPITAL_COMMUNITY): Payer: Self-pay | Admitting: *Deleted

## 2017-05-04 ENCOUNTER — Encounter (HOSPITAL_COMMUNITY)
Admission: RE | Disposition: A | Discharge status: Home / Self Care | Payer: Self-pay | Source: Ambulatory Visit | Attending: General Surgery

## 2017-05-04 ENCOUNTER — Ambulatory Visit (HOSPITAL_COMMUNITY): Payer: Medicare Other | Admitting: Anesthesiology

## 2017-05-04 DIAGNOSIS — Z8673 Personal history of transient ischemic attack (TIA), and cerebral infarction without residual deficits: Secondary | ICD-10-CM | POA: Diagnosis not present

## 2017-05-04 DIAGNOSIS — Z87891 Personal history of nicotine dependence: Secondary | ICD-10-CM | POA: Diagnosis not present

## 2017-05-04 DIAGNOSIS — K219 Gastro-esophageal reflux disease without esophagitis: Secondary | ICD-10-CM | POA: Diagnosis not present

## 2017-05-04 DIAGNOSIS — N400 Enlarged prostate without lower urinary tract symptoms: Secondary | ICD-10-CM | POA: Diagnosis not present

## 2017-05-04 DIAGNOSIS — K4090 Unilateral inguinal hernia, without obstruction or gangrene, not specified as recurrent: Secondary | ICD-10-CM | POA: Insufficient documentation

## 2017-05-04 HISTORY — PX: INGUINAL HERNIA REPAIR: SHX194

## 2017-05-04 SURGERY — REPAIR, HERNIA, INGUINAL, ADULT
Anesthesia: Spinal | Site: Groin | Laterality: Right

## 2017-05-04 MED ORDER — LIDOCAINE HCL (PF) 1 % IJ SOLN
INTRAMUSCULAR | Status: AC
  Filled 2017-05-04: qty 5

## 2017-05-04 MED ORDER — KETOROLAC TROMETHAMINE 30 MG/ML IJ SOLN
INTRAMUSCULAR | Status: AC
  Filled 2017-05-04: qty 1

## 2017-05-04 MED ORDER — LACTATED RINGERS IV SOLN
INTRAVENOUS | Status: DC
  Administered 2017-05-04 (×2): via INTRAVENOUS

## 2017-05-04 MED ORDER — FENTANYL CITRATE (PF) 100 MCG/2ML IJ SOLN
INTRAMUSCULAR | Status: AC
  Filled 2017-05-04: qty 2

## 2017-05-04 MED ORDER — BUPIVACAINE LIPOSOME 1.3 % IJ SUSP
INTRAMUSCULAR | Status: DC | PRN
  Administered 2017-05-04: 20 mL

## 2017-05-04 MED ORDER — ONDANSETRON HCL 4 MG/2ML IJ SOLN
INTRAMUSCULAR | Status: AC
Start: 2017-05-04 — End: ?
  Filled 2017-05-04: qty 4

## 2017-05-04 MED ORDER — BUPIVACAINE IN DEXTROSE 0.75-8.25 % IT SOLN
INTRATHECAL | Status: DC | PRN
  Administered 2017-05-04 (×2): 15 mg via INTRATHECAL

## 2017-05-04 MED ORDER — PROPOFOL 10 MG/ML IV BOLUS
INTRAVENOUS | Status: AC
  Filled 2017-05-04: qty 20

## 2017-05-04 MED ORDER — PROPOFOL 10 MG/ML IV BOLUS
INTRAVENOUS | Status: DC | PRN
  Administered 2017-05-04: 20 mg via INTRAVENOUS

## 2017-05-04 MED ORDER — KETOROLAC TROMETHAMINE 30 MG/ML IJ SOLN
30.0000 mg | Freq: Once | INTRAMUSCULAR | Status: AC
  Administered 2017-05-04: 30 mg via INTRAVENOUS

## 2017-05-04 MED ORDER — HYDROCODONE-ACETAMINOPHEN 5-325 MG PO TABS: 1.0000 | ORAL_TABLET | ORAL | 0 refills | Status: DC | PRN

## 2017-05-04 MED ORDER — CHLORHEXIDINE GLUCONATE CLOTH 2 % EX PADS: 6.0000 | MEDICATED_PAD | Freq: Once | CUTANEOUS | Status: DC

## 2017-05-04 MED ORDER — LIDOCAINE HCL (PF) 2 % IJ SOLN
INTRAMUSCULAR | Status: AC
  Filled 2017-05-04: qty 10

## 2017-05-04 MED ORDER — BUPIVACAINE IN DEXTROSE 0.75-8.25 % IT SOLN
INTRATHECAL | Status: AC
  Filled 2017-05-04: qty 6

## 2017-05-04 MED ORDER — FENTANYL CITRATE (PF) 100 MCG/2ML IJ SOLN
INTRAMUSCULAR | Status: DC | PRN
  Administered 2017-05-04: 25 ug via INTRAVENOUS
  Administered 2017-05-04: 10 ug via INTRAVENOUS
  Administered 2017-05-04: 15 ug via INTRATHECAL

## 2017-05-04 MED ORDER — ROCURONIUM BROMIDE 50 MG/5ML IV SOLN
INTRAVENOUS | Status: AC
  Filled 2017-05-04: qty 1

## 2017-05-04 MED ORDER — LIDOCAINE HCL (PF) 1 % IJ SOLN
INTRAMUSCULAR | Status: AC
Start: 2017-05-04 — End: ?
  Filled 2017-05-04: qty 5

## 2017-05-04 MED ORDER — BUPIVACAINE LIPOSOME 1.3 % IJ SUSP
INTRAMUSCULAR | Status: AC
  Filled 2017-05-04: qty 20

## 2017-05-04 MED ORDER — ONDANSETRON 4 MG PO TBDP
4.0000 mg | ORAL_TABLET | Freq: Once | ORAL | Status: AC
  Administered 2017-05-04: 4 mg via ORAL

## 2017-05-04 MED ORDER — PROPOFOL 500 MG/50ML IV EMUL
INTRAVENOUS | Status: DC | PRN
  Administered 2017-05-04: 25 ug/kg/min via INTRAVENOUS

## 2017-05-04 MED ORDER — MIDAZOLAM HCL 2 MG/2ML IJ SOLN
INTRAMUSCULAR | Status: AC
  Filled 2017-05-04: qty 2

## 2017-05-04 MED ORDER — CEFAZOLIN SODIUM-DEXTROSE 2-4 GM/100ML-% IV SOLN
2.0000 g | INTRAVENOUS | Status: AC
  Administered 2017-05-04: 2 g via INTRAVENOUS
  Filled 2017-05-04: qty 100

## 2017-05-04 MED ORDER — SODIUM CHLORIDE 0.9 % IR SOLN
Status: DC | PRN
  Administered 2017-05-04: 1000 mL

## 2017-05-04 MED ORDER — MIDAZOLAM HCL 2 MG/2ML IJ SOLN
1.0000 mg | Freq: Once | INTRAMUSCULAR | Status: AC | PRN
  Administered 2017-05-04: 2 mg via INTRAVENOUS

## 2017-05-04 MED ORDER — ONDANSETRON 4 MG PO TBDP
ORAL_TABLET | ORAL | Status: AC
  Filled 2017-05-04: qty 1

## 2017-05-04 MED ORDER — BUPIVACAINE IN DEXTROSE 0.75-8.25 % IT SOLN
INTRATHECAL | Status: AC
  Filled 2017-05-04: qty 2

## 2017-05-04 MED ORDER — SODIUM CHLORIDE 0.9 % IJ SOLN
INTRAMUSCULAR | Status: AC
  Filled 2017-05-04: qty 10

## 2017-05-04 SURGICAL SUPPLY — 39 items
BAG HAMPER (MISCELLANEOUS) ×3 IMPLANT
CLOTH BEACON ORANGE TIMEOUT ST (SAFETY) ×3 IMPLANT
COVER LIGHT HANDLE STERIS (MISCELLANEOUS) ×6 IMPLANT
DERMABOND ADVANCED (GAUZE/BANDAGES/DRESSINGS) ×2
DERMABOND ADVANCED .7 DNX12 (GAUZE/BANDAGES/DRESSINGS) ×1 IMPLANT
DRAIN PENROSE 18X1/2 LTX STRL (DRAIN) ×3 IMPLANT
ELECT REM PT RETURN 9FT ADLT (ELECTROSURGICAL) ×3
ELECTRODE REM PT RTRN 9FT ADLT (ELECTROSURGICAL) ×1 IMPLANT
GLOVE BIO SURGEON STRL SZ7 (GLOVE) ×3 IMPLANT
GLOVE BIOGEL PI IND STRL 6.5 (GLOVE) ×2 IMPLANT
GLOVE BIOGEL PI IND STRL 7.0 (GLOVE) ×2 IMPLANT
GLOVE BIOGEL PI INDICATOR 6.5 (GLOVE) ×4
GLOVE BIOGEL PI INDICATOR 7.0 (GLOVE) ×4
GLOVE SURG SS PI 7.5 STRL IVOR (GLOVE) ×3 IMPLANT
GOWN STRL REUS W/ TWL XL LVL3 (GOWN DISPOSABLE) ×1 IMPLANT
GOWN STRL REUS W/TWL LRG LVL3 (GOWN DISPOSABLE) ×6 IMPLANT
GOWN STRL REUS W/TWL XL LVL3 (GOWN DISPOSABLE) ×2
INST SET MINOR GENERAL (KITS) ×3 IMPLANT
KIT ROOM TURNOVER APOR (KITS) ×3 IMPLANT
MANIFOLD NEPTUNE II (INSTRUMENTS) ×3 IMPLANT
MESH HERNIA 1.6X1.9 PLUG LRG (Mesh General) ×1 IMPLANT
MESH HERNIA PLUG LRG (Mesh General) ×2 IMPLANT
NEEDLE HYPO 21X1.5 SAFETY (NEEDLE) ×3 IMPLANT
NS IRRIG 1000ML POUR BTL (IV SOLUTION) ×3 IMPLANT
PACK MINOR (CUSTOM PROCEDURE TRAY) ×3 IMPLANT
PAD ARMBOARD 7.5X6 YLW CONV (MISCELLANEOUS) ×3 IMPLANT
SET BASIN LINEN APH (SET/KITS/TRAYS/PACK) ×3 IMPLANT
SUT MNCRL AB 4-0 PS2 18 (SUTURE) ×3 IMPLANT
SUT NOVA NAB GS-22 2 2-0 T-19 (SUTURE) ×6 IMPLANT
SUT PROLENE 2 0 SH 30 (SUTURE) IMPLANT
SUT SILK 3 0 (SUTURE)
SUT SILK 3-0 18XBRD TIE 12 (SUTURE) IMPLANT
SUT VIC AB 2-0 CT1 27 (SUTURE) ×2
SUT VIC AB 2-0 CT1 TAPERPNT 27 (SUTURE) ×1 IMPLANT
SUT VIC AB 3-0 SH 27 (SUTURE) ×2
SUT VIC AB 3-0 SH 27X BRD (SUTURE) ×1 IMPLANT
SUT VIC AB 4-0 PS2 27 (SUTURE) ×3 IMPLANT
SUT VICRYL AB 3 0 TIES (SUTURE) IMPLANT
SYR 20CC LL (SYRINGE) ×3 IMPLANT

## 2017-05-04 NOTE — Op Note (Signed)
Patient:  William Burns  DOB:  08/12/1945  MRN:  [id number]   Preop Diagnosis: Right inguinal hernia  Postop Diagnosis: Same  Procedure: Right inguinal herniorrhaphy with mesh  Surgeon: Aviva Signs, MD  Anes: Spinal  Indications: Patient is a 71 year old white male who presents with a symptomatic right inguinal hernia.  Risks and benefits of the procedure including bleeding, infection, mesh use, and the possibility of recurrence of the hernia were fully explained to the patient, who gave informed consent.  Procedure note: The patient was placed in supine position.  After spinal anesthesia was administered, the right groin region was prepped and draped using the usual sterile technique with technic care.  Surgical site confirmation was performed.  A transverse incision was made in the right groin region down to the external oblique aponeurosis.  The porosis was incised to the external ring.  The Penrose drain was placed around the spermatic cord.  The vase deferens was noted within the spermatic cord.  The ilioinguinal nerve was identified and retracted superiorly from the operative field.  Patient had a large indirect hernia sac.  This was freed away from the spermatic cord up to the peritoneal reflection and inverted.  A large Bard PerFix plug was then inserted.  An onlay patch was then placed along the floor of the inguinal canal and secured superiorly to the conjoined tendon and inferiorly to the shelving edge of Poupart's ligament using 2-0 Novafil interrupted sutures.  The internal ring was re-created using a 2-0 Novafil interrupted suture.  The external oblique aponeurosis was reapproximated using a 2-0 Vicryl running suture.  Subcutaneous layer was reapproximated using a 3-0 Vicryl interrupted suture.  Exparel was instilled into the surrounding wound.  The skin was closed using a 4-0 Monocryl subcuticular suture.  Dermabond was applied.  All tape needle counts were correct at the end  of the procedure.  The patient was transferred to PACU in stable condition.  Complications: None  EBL: Minimal  Specimen: None

## 2017-05-04 NOTE — Interval H&P Note (Signed)
History and Physical Interval Note:  05/04/2017 10:49 AM  William Burns  has presented today for surgery, with the diagnosis of right inguinal hernia  The various methods of treatment have been discussed with the patient and family. After consideration of risks, benefits and other options for treatment, the patient has consented to  Procedure(s): HERNIA REPAIR INGUINAL ADULT WITH MESH (Right) as a surgical intervention .  The patient's history has been reviewed, patient examined, no change in status, stable for surgery.  I have reviewed the patient's chart and labs.  Questions were answered to the patient's satisfaction.     Aviva Signs

## 2017-05-04 NOTE — Anesthesia Procedure Notes (Signed)
Procedure Name: MAC Date/Time: 05/04/2017 11:38 AM Performed by: Vista Deck, CRNA Pre-anesthesia Checklist: Patient identified, Emergency Drugs available, Suction available, Timeout performed and Patient being monitored Patient Re-evaluated:Patient Re-evaluated prior to induction Oxygen Delivery Method: Nasal Cannula

## 2017-05-04 NOTE — Discharge Instructions (Signed)
Open Hernia Repair, Adult, Care After These instructions give you information about caring for yourself after your procedure. Your doctor may also give you more specific instructions. If you have problems or questions, contact your doctor. Follow these instructions at home: Surgical cut (incision) care   Follow instructions from your doctor about how to take care of your surgical cut area. Make sure you: ? Wash your hands with soap and water before you change your bandage (dressing). If you cannot use soap and water, use hand sanitizer. ? Change your bandage as told by your doctor. ? Leave stitches (sutures), skin glue, or skin tape (adhesive) strips in place. They may need to stay in place for 2 weeks or longer. If tape strips get loose and curl up, you may trim the loose edges. Do not remove tape strips completely unless your doctor says it is okay.  Check your surgical cut every day for signs of infection. Check for: ? More redness, swelling, or pain. ? More fluid or blood. ? Warmth. ? Pus or a bad smell. Activity  Do not drive or use heavy machinery while taking prescription pain medicine. Do not drive until your doctor says it is okay.  Until your doctor says it is okay: ? Do not lift anything that is heavier than 10 lb (4.5 kg). ? Do not play contact sports.  Return to your normal activities as told by your doctor. Ask your doctor what activities are safe. General instructions  To prevent or treat having a hard time pooping (constipation) while you are taking prescription pain medicine, your doctor may recommend that you: ? Drink enough fluid to keep your pee (urine) clear or pale yellow. ? Take over-the-counter or prescription medicines. ? Eat foods that are high in fiber, such as fresh fruits and vegetables, whole grains, and beans. ? Limit foods that are high in fat and processed sugars, such as fried and sweet foods.  Take over-the-counter and prescription medicines only as  told by your doctor.  Do not take baths, swim, or use a hot tub until your doctor says it is okay.  Keep all follow-up visits as told by your doctor. This is important. Contact a doctor if:  You develop a rash.  You have more redness, swelling, or pain around your surgical cut.  You have more fluid or blood coming from your surgical cut.  Your surgical cut feels warm to the touch.  You have pus or a bad smell coming from your surgical cut.  You have a fever or chills.  You have blood in your poop (stool).  You have not pooped in 2-3 days.  Medicine does not help your pain. Get help right away if:  You have chest pain or you are short of breath.  You feel light-headed.  You feel weak and dizzy (feel faint).  You have very bad pain.  You throw up (vomit) and your pain is worse. This information is not intended to replace advice given to you by your health care provider. Make sure you discuss any questions you have with your health care provider. Document Released: 06/09/2014 Document Revised: 12/07/2015 Document Reviewed: 10/31/2015 Elsevier Interactive Patient Education  2017 [employer]    PATIENT INSTRUCTIONS POST-ANESTHESIA  IMMEDIATELY FOLLOWING SURGERY:  Do not drive or operate machinery for the first twenty four hours after surgery.  Do not make any important decisions for twenty four hours after surgery or while taking narcotic pain medications or sedatives.  If you develop  intractable nausea and vomiting or a severe headache please notify your doctor immediately.  FOLLOW-UP:  Please make an appointment with your surgeon as instructed. You do not need to follow up with anesthesia unless specifically instructed to do so.  WOUND CARE INSTRUCTIONS (if applicable):  Keep a dry clean dressing on the anesthesia/puncture wound site if there is drainage.  Once the wound has quit draining you may leave it open to air.  Generally you should leave the bandage intact for  twenty four hours unless there is drainage.  If the epidural site drains for more than 36-48 hours please call the anesthesia department.  QUESTIONS?:  Please feel free to call your physician or the hospital operator if you have any questions, and they will be happy to assist you.

## 2017-05-04 NOTE — Anesthesia Preprocedure Evaluation (Signed)
Anesthesia Evaluation  Patient identified by MRN, date of birth, ID band Patient awake    History of Anesthesia Complications (+) DIFFICULT AIRWAY and history of anesthetic complications (success with glidescope 4, grade I view)  Airway Mallampati: II  TM Distance: >3 FB Neck ROM: Full   Comment: Slightly small mouth opening Dental  (+) Teeth Intact   Pulmonary former smoker,    Pulmonary exam normal        Cardiovascular Exercise Tolerance: Good Normal cardiovascular exam Rhythm:Regular Rate:Normal   Systolic function was normal.   The estimated ejection fraction was in the range of 55% to 60%.   Wall motion was normal; there were no regional wall motion   Abnormalities.  (2016)   Neuro/Psych TIA   GI/Hepatic PUD, GERD  Medicated and Controlled,  Endo/Other    Renal/GU Renal diseaseResults for ORLANDIS, SANDEN (MRN [id number]) as of 05/04/2017 09:32  04/30/2017 14:45 Sodium: 139 Potassium: 3.5 Chloride: 105 CO2: 26 Glucose: 132 (H) BUN: 17 Creatinine: 0.92      Musculoskeletal   Abdominal Normal abdominal exam  (+)  Abdomen: soft.    Peds  Hematology Results for MCCAULEY, DIEHL (MRN [id number]) as of 05/04/2017 09:32  04/30/2017 14:45 WBC: 10.0 RBC: 4.90 Hemoglobin: 14.5 HCT: 44.7 MCV: 91.2 MCH: 29.6 MCHC: 32.4 RDW: 13.6 Platelets: 267    Anesthesia Other Findings   Reproductive/Obstetrics                             Anesthesia Physical Anesthesia Plan  ASA: II  Anesthesia Plan: Spinal   Post-op Pain Management:    Induction:   PONV Risk Score and Plan:   Airway Management Planned: Nasal Cannula  Additional Equipment:   Intra-op Plan:   Post-operative Plan:   Informed Consent: I have reviewed the patients History and Physical, chart, labs and discussed the procedure including the risks, benefits and alternatives for the proposed anesthesia with the patient  or authorized representative who has indicated his/her understanding and acceptance.   Dental advisory given  Plan Discussed with: CRNA and Surgeon  Anesthesia Plan Comments:         Anesthesia Quick Evaluation

## 2017-05-04 NOTE — Anesthesia Postprocedure Evaluation (Signed)
Anesthesia Post Note  Patient: William Burns  Procedure(s) Performed: HERNIA REPAIR INGUINAL ADULT WITH MESH (Right Groin)  Patient location during evaluation: PACU Anesthesia Type: Spinal Level of consciousness: awake and alert Pain management: satisfactory to patient Vital Signs Assessment: post-procedure vital signs reviewed and stable Respiratory status: spontaneous breathing Cardiovascular status: stable Postop Assessment: spinal receding Anesthetic complications: no     Last Vitals:  Vitals:   05/04/17 1000 05/04/17 1315  BP: 110/78 (!) 106/59  Pulse:  65  Resp: (!) 23 14  Temp:  36.7 C  SpO2: 96% 99%    Last Pain:  Vitals:   05/04/17 1315  TempSrc:   PainSc: 0-No pain                 Hellen Shanley

## 2017-05-04 NOTE — Anesthesia Procedure Notes (Addendum)
Spinal  Patient location during procedure: OR Start time: 05/04/2017 12:05 PM End time: 05/04/2017 12:09 PM Preanesthetic Checklist Completed: patient identified, site marked, surgical consent, pre-op evaluation, timeout performed, IV checked, risks and benefits discussed and monitors and equipment checked Spinal Block Patient position: right lateral decubitus Prep: ChloraPrep and times 2 Patient monitoring: heart rate, cardiac monitor, continuous pulse ox and blood pressure Approach: right paramedian Location: L3-4 Injection technique: single-shot Needle Needle type: Spinocan  Needle gauge: 22 G Needle length: 9 cm Assessment Sensory level: T8 Additional Notes ATTEMPTS: 2 TRAY ID: GTIN: [id number] LOT# [id number] TRAY EXPIRATION DATE: 2019-04-02

## 2017-05-04 NOTE — Anesthesia Postprocedure Evaluation (Signed)
Anesthesia Post Note  Patient: William Burns  Procedure(s) Performed: HERNIA REPAIR INGUINAL ADULT WITH MESH (Right Groin)  Anesthesia Type: Spinal Comments: Patient able to stand and take a few steps; not at baseline yet but getting stronger.     Last Vitals:  Vitals:   05/04/17 1715 05/04/17 1747  BP: 128/66 129/71  Pulse: (!) 58 61  Resp: 15 16  Temp:  36.5 C  SpO2: 98% 99%    Last Pain:  Vitals:   05/04/17 1747  TempSrc: Oral  PainSc: 3                  ADAMS, AMY A

## 2017-05-04 NOTE — Addendum Note (Signed)
Addendum  created 05/04/17 1803 by Mickel Baas, CRNA   Sign clinical note

## 2017-05-04 NOTE — Transfer of Care (Signed)
Immediate Anesthesia Transfer of Care Note  Patient: William Burns  Procedure(s) Performed: HERNIA REPAIR INGUINAL ADULT WITH MESH (Right Groin)  Patient Location: PACU  Anesthesia Type:Spinal  Level of Consciousness: awake, alert  and patient cooperative  Airway & Oxygen Therapy: Patient Spontanous Breathing and Patient connected to nasal cannula oxygen  Post-op Assessment: Report given to RN and Post -op Vital signs reviewed and stable  Post vital signs: Reviewed and stable  Last Vitals:  Vitals:   05/04/17 0927 05/04/17 1000  BP:  110/78  Resp:  (!) 23  Temp: 36.7 C   SpO2:  96%    Last Pain:  Vitals:   05/04/17 0927  TempSrc: Oral      Patients Stated Pain Goal: 6 (32/99/24 2683)  Complications: No apparent anesthesia complications

## 2017-05-04 NOTE — Anesthesia Procedure Notes (Addendum)
Spinal  Patient location during procedure: OR Start time: 05/04/2017 11:46 AM End time: 05/04/2017 11:48 AM Preanesthetic Checklist Completed: patient identified, site marked, surgical consent, pre-op evaluation, timeout performed, IV checked, risks and benefits discussed and monitors and equipment checked Spinal Block Patient position: sitting Prep: ChloraPrep and 2 times Patient monitoring: heart rate, cardiac monitor, continuous pulse ox and blood pressure Approach: midline Location: L3-4 Injection technique: single-shot Needle Needle type: Spinocan  Needle gauge: 22 G Needle length: 9 cm Additional Notes  ATTEMPTS: 1 TRAY ID: Melvern Sample [id number] LOT # [id number] TRAY EXPIRATION DATE: 2019-04-02

## 2017-05-05 ENCOUNTER — Encounter (HOSPITAL_COMMUNITY): Payer: Self-pay | Admitting: General Surgery

## 2017-05-07 ENCOUNTER — Encounter (HOSPITAL_COMMUNITY): Payer: Self-pay | Admitting: General Surgery

## 2017-05-14 ENCOUNTER — Ambulatory Visit: Payer: Medicare Other | Admitting: General Surgery

## 2017-05-14 ENCOUNTER — Encounter: Payer: Self-pay | Admitting: General Surgery

## 2017-05-14 ENCOUNTER — Ambulatory Visit (INDEPENDENT_AMBULATORY_CARE_PROVIDER_SITE_OTHER): Payer: Medicare Other | Admitting: General Surgery

## 2017-05-14 VITALS — BP 158/67 | HR 68 | Temp 98.7°F | Ht 75.0 in | Wt 241.0 lb

## 2017-05-14 DIAGNOSIS — Z09 Encounter for follow-up examination after completed treatment for conditions other than malignant neoplasm: Secondary | ICD-10-CM

## 2017-05-14 NOTE — Progress Notes (Signed)
Subjective:     William Burns  Doing well, status post right inguinal herniorrhaphy with mesh.  Has had intermittent pain, but is well controlled with ibuprofen.  Has 0 out of 10 pain today. Objective:    BP (!) 158/67   Pulse 68   Temp 98.7 F (37.1 C)   Ht 6\' 3"  (1.905 m)   Wt 241 lb (109.3 kg)   BMI 30.12 kg/m   General:  alert, cooperative and no distress  Abdomen soft, incision healing well.     Assessment:    Doing well postoperatively.    Plan:   May gradually resume normal activity.  Follow-up here as needed.

## 2017-06-10 ENCOUNTER — Encounter (HOSPITAL_COMMUNITY): Payer: Self-pay | Admitting: General Surgery

## 2017-08-20 ENCOUNTER — Other Ambulatory Visit (INDEPENDENT_AMBULATORY_CARE_PROVIDER_SITE_OTHER): Payer: Self-pay | Admitting: Internal Medicine

## 2017-10-23 DIAGNOSIS — Z8546 Personal history of malignant neoplasm of prostate: Secondary | ICD-10-CM | POA: Diagnosis not present

## 2017-10-23 DIAGNOSIS — N5231 Erectile dysfunction following radical prostatectomy: Secondary | ICD-10-CM | POA: Diagnosis not present

## 2017-10-23 DIAGNOSIS — N393 Stress incontinence (female) (male): Secondary | ICD-10-CM | POA: Diagnosis not present

## 2017-11-18 DIAGNOSIS — R350 Frequency of micturition: Secondary | ICD-10-CM | POA: Diagnosis not present

## 2017-11-18 DIAGNOSIS — R351 Nocturia: Secondary | ICD-10-CM | POA: Diagnosis not present

## 2017-12-21 ENCOUNTER — Ambulatory Visit (HOSPITAL_COMMUNITY)
Admission: RE | Admit: 2017-12-21 | Discharge: 2017-12-21 | Disposition: A | Discharge status: Home / Self Care | Payer: Medicare Other | Source: Ambulatory Visit | Attending: Oncology | Admitting: Oncology

## 2017-12-21 ENCOUNTER — Other Ambulatory Visit (HOSPITAL_COMMUNITY): Payer: Self-pay | Admitting: Nurse Practitioner

## 2017-12-21 DIAGNOSIS — C189 Malignant neoplasm of colon, unspecified: Secondary | ICD-10-CM

## 2017-12-21 DIAGNOSIS — I700 Atherosclerosis of aorta: Secondary | ICD-10-CM | POA: Insufficient documentation

## 2017-12-21 DIAGNOSIS — N200 Calculus of kidney: Secondary | ICD-10-CM | POA: Insufficient documentation

## 2017-12-21 DIAGNOSIS — R911 Solitary pulmonary nodule: Secondary | ICD-10-CM | POA: Insufficient documentation

## 2017-12-21 DIAGNOSIS — I2510 Atherosclerotic heart disease of native coronary artery without angina pectoris: Secondary | ICD-10-CM | POA: Insufficient documentation

## 2017-12-29 ENCOUNTER — Other Ambulatory Visit: Payer: Self-pay

## 2017-12-29 ENCOUNTER — Inpatient Hospital Stay (HOSPITAL_COMMUNITY): Payer: Medicare Other | Attending: Hematology | Admitting: Hematology

## 2017-12-29 ENCOUNTER — Encounter (HOSPITAL_COMMUNITY): Payer: Self-pay | Admitting: Hematology

## 2017-12-29 VITALS — BP 127/69 | HR 77 | Temp 98.3°F | Resp 18 | Wt 247.5 lb

## 2017-12-29 DIAGNOSIS — Z8546 Personal history of malignant neoplasm of prostate: Secondary | ICD-10-CM | POA: Insufficient documentation

## 2017-12-29 DIAGNOSIS — R911 Solitary pulmonary nodule: Secondary | ICD-10-CM | POA: Diagnosis not present

## 2017-12-29 DIAGNOSIS — C189 Malignant neoplasm of colon, unspecified: Secondary | ICD-10-CM

## 2017-12-29 DIAGNOSIS — E041 Nontoxic single thyroid nodule: Secondary | ICD-10-CM | POA: Diagnosis not present

## 2017-12-29 DIAGNOSIS — Z85038 Personal history of other malignant neoplasm of large intestine: Secondary | ICD-10-CM

## 2017-12-29 NOTE — Assessment & Plan Note (Signed)
1.  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  No adjuvant chemotherapy was recommended. -Last CT scan of the abdomen and pelvis on 12/17/2016 showing stable pelvic nodes, which were negative on PET CT scan.  Last CEA was also was within normal limits. -No further scans needed.  2.  Prostate cancer: -Underwent radical prostatectomy on 01/19/2015 by Dr. Louis Meckel with [facility].  Reportedly his PSA has been staying undetectable since then.  3.  Lung nodule: -I have reviewed reports of the CT chest dated 12/21/2017 which showed stable 4 mm right lower lobe lung nodule.  No further scans recommended as it has been staying stable since July 2018.  4.  Right thyroid nodule: - There was a suggestion of increase in size of the thyroid nodule on the most current CT chest measuring 16 mm.  This was measuring 11 mm on CT chest in July 2018.  Most likely benign and described as  hypodense.  We will monitor it in a ER with an ultrasound of the neck.

## 2017-12-29 NOTE — Progress Notes (Signed)
William Burns, [place] [id number]   CLINIC:  Medical Oncology/Hematology  PCP:  Celene Squibb, MD [place] [address] [phone]   REASON FOR VISIT:  Follow-up for cecal and prostate cancer  CURRENT THERAPY: Observation   INTERVAL HISTORY:  William Burns 72 y.o. male returns for routine follow-up for cecal and prostate cancer. Patient is here today with his wife. Patient has been doing well, with no complaints. Patient has good energy works outside all day. His appetite is 100%. He lives at home with his wife and performs all his own ADLs and activities without problems. Patient denies any blood in his stools. Patient denies any new pains. Denies any nausea,vomiting, or diarrhea.      REVIEW OF SYSTEMS:  Review of Systems  All other systems reviewed and are negative.    PAST MEDICAL/SURGICAL HISTORY:  Past Medical History:  Diagnosis Date  . BPH (benign prostatic hyperplasia)   . Cancer ([place])    colon  . Cataract   . Colonic mass 07/03/2011  . Difficult intubation    extra long tube for intub per wife; states "his neck is longer than regular people" and slow to wake up   . Erosive gastritis 07/03/2011  . GERD (gastroesophageal reflux disease)   . History of kidney stones   . Kidney stones 2011  . Renal calculus   . Tooth infection 07/03/2011   Past Surgical History:  Procedure Laterality Date  . CATARACT EXTRACTION W/PHACO Left 08/23/2012   Procedure: CATARACT EXTRACTION PHACO AND INTRAOCULAR LENS PLACEMENT (IOC);  Surgeon: Tonny Branch, MD;  Location: [facility];  Service: Ophthalmology;  Laterality: Left;  CDE 16.64  . CATARACT EXTRACTION W/PHACO Right 06/09/2013   Procedure: CATARACT EXTRACTION PHACO AND INTRAOCULAR LENS PLACEMENT (IOC) CDE=10.86;  Surgeon: Tonny Branch, MD;  Location: [facility];  Service: Ophthalmology;  Laterality: Right;  . COLON SURGERY    . COLONOSCOPY  07/03/2011   Procedure: COLONOSCOPY;  Surgeon:  Rogene Houston, MD;  [facility];  Service: Endoscopy;  Laterality: N/A;  . COLONOSCOPY N/A 09/16/2012   Procedure: COLONOSCOPY;  Surgeon: Rogene Houston, MD;  [facility];  Service: Endoscopy;  Laterality: N/A;  930  . COLONOSCOPY N/A 12/05/2015   Procedure: COLONOSCOPY;  Surgeon: Rogene Houston, MD;  [facility];  Service: Endoscopy;  Laterality: N/A;  1200  . CYSTOSCOPY  07/09/2011   Procedure: CYSTOSCOPY FLEXIBLE;  Surgeon: Marissa Nestle, MD;  Location: [facility];  Service: Urology;  Laterality: N/A;  . CYSTOSCOPY WITH LITHOLAPAXY  07/11/2011   Procedure: CYSTOSCOPY WITH LITHOLAPAXY;  Surgeon: Marissa Nestle, MD;  Location: [facility];  Service: Urology;  Laterality: N/A;  . ESOPHAGOGASTRODUODENOSCOPY  07/03/2011   Procedure: ESOPHAGOGASTRODUODENOSCOPY (EGD);  Surgeon: Rogene Houston, MD;  [facility];  Service: Endoscopy;  Laterality: N/A;  . HERNIA REPAIR  1751    umbilical   . INGUINAL HERNIA REPAIR Right 05/04/2017   Procedure: HERNIA REPAIR INGUINAL ADULT WITH MESH;  Surgeon: Aviva Signs, MD;  Location: [facility];  Service: General;  Laterality: Right;  . PARTIAL COLECTOMY  07/04/2011   Procedure: PARTIAL COLECTOMY;  Surgeon: Jamesetta So, MD;  Location: [facility];  Service: General;  Laterality: N/A;  . Prostage cancer    . PROSTATE BIOPSY    . ROBOT ASSISTED LAPAROSCOPIC RADICAL PROSTATECTOMY Bilateral 01/19/2015   Procedure: ROBOTIC ASSISTED LAPAROSCOPIC RADICAL PROSTATECTOMY AND BILATERAL PELVIC  LYMPH NODE DISSECTION, LAPARASCOPIC  LYSIS OF ADHESIONS;  Surgeon: Ardis Hughs, MD;  Location: [facility];  Service: Urology;  Laterality: Bilateral;  . TRANSURETHRAL RESECTION OF PROSTATE  07/11/2011   Procedure: TRANSURETHRAL RESECTION OF THE PROSTATE (TURP);  Surgeon: Marissa Nestle, MD;  Location: [facility];  Service: Urology;  Laterality: N/A;     SOCIAL HISTORY:  Social History   Socioeconomic History  . Marital status: Married    Spouse  name: Not on file  . Number of children: Not on file  . Years of education: Not on file  . Highest education level: Not on file  Occupational History  . Occupation: farmer    Comment: tobacco; cattle; Sales executive  Social Needs  . Financial resource strain: Not on file  . Food insecurity:    Worry: Not on file    Inability: Not on file  . Transportation needs:    Medical: Not on file    Non-medical: Not on file  Tobacco Use  . Smoking status: Former Smoker    Packs/day: 2.00    Years: 25.00    Pack years: 50.00    Types: Cigarettes    Last attempt to quit: 06/02/1989    Years since quitting: 28.5  . Smokeless tobacco: Never Used  Substance and Sexual Activity  . Alcohol use: No  . Drug use: No  . Sexual activity: Not Currently  Lifestyle  . Physical activity:    Days per week: Not on file    Minutes per session: Not on file  . Stress: Not on file  Relationships  . Social connections:    Talks on phone: Not on file    Gets together: Not on file    Attends religious service: Not on file    Active member of club or organization: Not on file    Attends meetings of clubs or organizations: Not on file    Relationship status: Not on file  . Intimate partner violence:    Fear of current or ex partner: Not on file    Emotionally abused: Not on file    Physically abused: Not on file    Forced sexual activity: Not on file  Other Topics Concern  . Not on file  Social History Narrative  . Not on file    FAMILY HISTORY:  Family History  Problem Relation Age of Onset  . Alzheimer's disease Mother   . Alzheimer's disease Father   . Aneurysm Brother        brain  . Anuerysm Brother   . Healthy Son   . Healthy Son   . Colon cancer Neg Hx     CURRENT MEDICATIONS:  Outpatient Encounter Medications as of 12/29/2017  Medication Sig  . Cyanocobalamin (VITAMIN B-12 PO) Take 1 tablet by mouth daily.  Marland Kitchen HYDROcodone-acetaminophen (NORCO) 5-325 MG tablet Take 1 tablet by mouth every  4 (four) hours as needed for moderate pain.  . Multiple Vitamins-Minerals (MULTIVITAMIN MEN PO) Take 1 tablet by mouth daily.   . Omega-3 Fatty Acids (FISH OIL PO) Take 1 capsule by mouth daily.  . pantoprazole (PROTONIX) 40 MG tablet TAKE 1 TABLET BY MOUTH ONCE DAILY.  . Probiotic CAPS Take 1 capsule by mouth daily. PB8 Supplement   No facility-administered encounter medications on file as of 12/29/2017.     ALLERGIES:  No Known Allergies   PHYSICAL EXAM:  ECOG Performance status: 1  Vitals:   12/29/17 1605  BP: 127/69  Pulse: 77  Resp: 18  Temp: 98.3 F (36.8 C)  SpO2: 98%   Filed Weights   12/29/17 1605  Weight: 247 lb 8 oz (112.3 kg)    Physical Exam  Constitutional: He is oriented to person, place, and time. He appears well-developed and well-nourished.  Neck: Normal range of motion. Neck supple.  Cardiovascular: Normal rate, regular rhythm and normal heart sounds.  Pulmonary/Chest: Effort normal and breath sounds normal.  Abdominal: Soft.  Neurological: He is alert and oriented to person, place, and time.  Skin: Skin is warm and dry.     LABORATORY DATA:  I have reviewed the labs as listed.  CBC    Component Value Date/Time   WBC 10.0 04/30/2017 1445   RBC 4.90 04/30/2017 1445   HGB 14.5 04/30/2017 1445   HCT 44.7 04/30/2017 1445   PLT 267 04/30/2017 1445   MCV 91.2 04/30/2017 1445   MCH 29.6 04/30/2017 1445   MCHC 32.4 04/30/2017 1445   RDW 13.6 04/30/2017 1445   LYMPHSABS 1.6 04/30/2017 1445   MONOABS 0.7 04/30/2017 1445   EOSABS 0.1 04/30/2017 1445   BASOSABS 0.0 04/30/2017 1445   CMP Latest Ref Rng & Units 04/30/2017 12/16/2016 08/18/2016  Glucose 65 - 99 mg/dL 132(H) 90 90  BUN 6 - 20 mg/dL 17 11 13   Creatinine 0.61 - 1.24 mg/dL 0.92 1.01 0.81  Sodium 135 - 145 mmol/L 139 138 134(L)  Potassium 3.5 - 5.1 mmol/L 3.5 4.0 3.7  Chloride 101 - 111 mmol/L 105 102 101  CO2 22 - 32 mmol/L 26 27 27   Calcium 8.9 - 10.3 mg/dL 8.9 9.0 8.7(L)  Total  Protein 6.5 - 8.1 g/dL - 7.5 7.2  Total Bilirubin 0.3 - 1.2 mg/dL - 0.7 0.6  Alkaline Phos 38 - 126 U/L - 70 67  AST 15 - 41 U/L - 45(H) 41  ALT 17 - 63 U/L - 36 32       DIAGNOSTIC IMAGING:  I reviewed the CT scan of the chest dated 12/21/2017 and discussed the results with the patient and his wife.     ASSESSMENT & PLAN:   Adenocarcinoma of colon 1.  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  No adjuvant chemotherapy was recommended. -Last CT scan of the abdomen and pelvis on 12/17/2016 showing stable pelvic nodes, which were negative on PET CT scan.  Last CEA was also was within normal limits. -No further scans needed.  2.  Prostate cancer: -Underwent radical prostatectomy on 01/19/2015 by Dr. Louis Meckel with [facility].  Reportedly his PSA has been staying undetectable since then.  3.  Lung nodule: -I have reviewed reports of the CT chest dated 12/21/2017 which showed stable 4 mm right lower lobe lung nodule.  No further scans recommended as it has been staying stable since July 2018.  4.  Right thyroid nodule: - There was a suggestion of increase in size of the thyroid nodule on the most current CT chest measuring 16 mm.  This was measuring 11 mm on CT chest in July 2018.  Most likely benign and described as  hypodense.  We will monitor it in a ER with an ultrasound of the neck.      Orders placed this encounter:  Orders Placed This Encounter  Procedures  . US Soft Tissue Head/Neck  . CBC with Differential/Platelet  . Comprehensive metabolic panel  . CEA  . PSA      Derek Jack, MD [facility] [phone]  

## 2018-03-09 DIAGNOSIS — L570 Actinic keratosis: Secondary | ICD-10-CM | POA: Diagnosis not present

## 2018-03-09 DIAGNOSIS — X32XXXD Exposure to sunlight, subsequent encounter: Secondary | ICD-10-CM | POA: Diagnosis not present

## 2018-03-31 DIAGNOSIS — Z23 Encounter for immunization: Secondary | ICD-10-CM | POA: Diagnosis not present

## 2018-03-31 DIAGNOSIS — L0390 Cellulitis, unspecified: Secondary | ICD-10-CM | POA: Diagnosis not present

## 2018-03-31 DIAGNOSIS — S80811A Abrasion, right lower leg, initial encounter: Secondary | ICD-10-CM | POA: Diagnosis not present

## 2018-04-09 DIAGNOSIS — H11153 Pinguecula, bilateral: Secondary | ICD-10-CM | POA: Diagnosis not present

## 2018-04-09 DIAGNOSIS — Z961 Presence of intraocular lens: Secondary | ICD-10-CM | POA: Diagnosis not present

## 2018-04-09 DIAGNOSIS — H43811 Vitreous degeneration, right eye: Secondary | ICD-10-CM | POA: Diagnosis not present

## 2018-04-09 DIAGNOSIS — H04123 Dry eye syndrome of bilateral lacrimal glands: Secondary | ICD-10-CM | POA: Diagnosis not present

## 2018-05-04 IMAGING — CT CT PELVIS WO/W CM
2 of 7 series · 12 of 46 positions shown, 18 images · IV contrast (iopamidol)
Comparison: PET-CT 09/06/2015 and CT scan 08/16/2015

CLINICAL DATA: History of prostate cancer and colon cancer with
hypermetabolic lymphadenopathy on recent PET-CT.

EXAM:
CT PELVIS WITH AND WITHOUT CONTRAST
TECHNIQUE: Multidetector CT imaging of the pelvis was performed following the
standard protocol before and following the bolus administration of
intravenous contrast.
CONTRAST:  100mL [id number] IOPAMIDOL ([id number]) INJECTION 61%

[Series 3: routine abd pel with · axial · 0.79mm/px · z∈[-470,-150]mm · 9 of 82 slices shown, 15 images]
[im 9/82  soft-tissue]
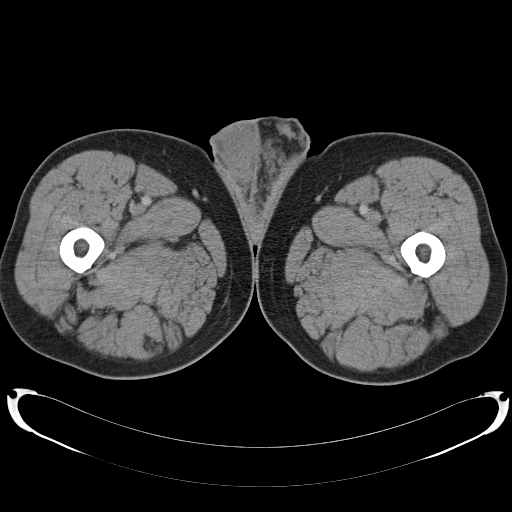
[im 9/82  bone]
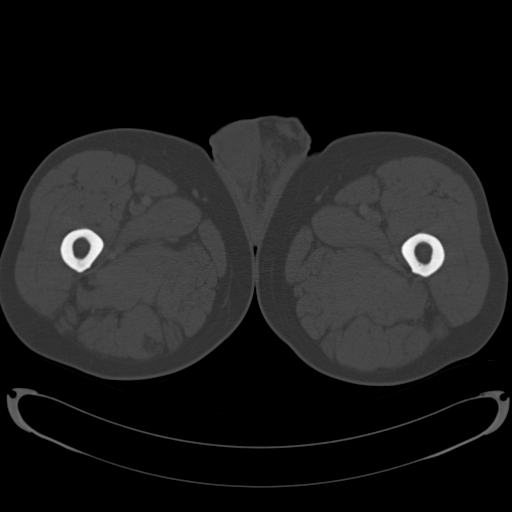
[im 17/82  soft-tissue]
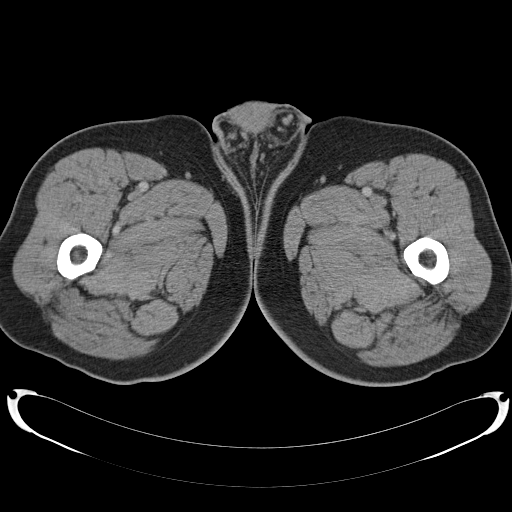
[im 25/82  soft-tissue]
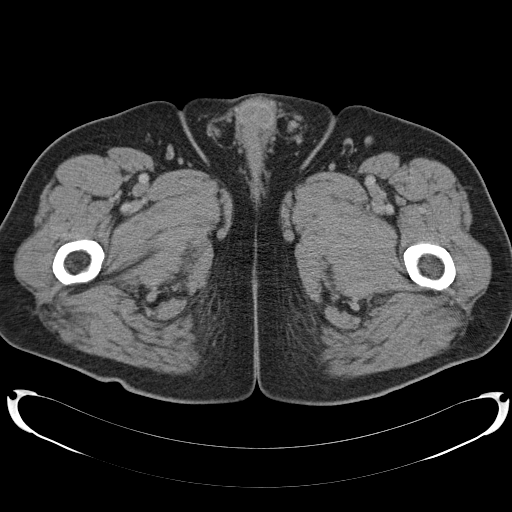
[im 33/82  soft-tissue]
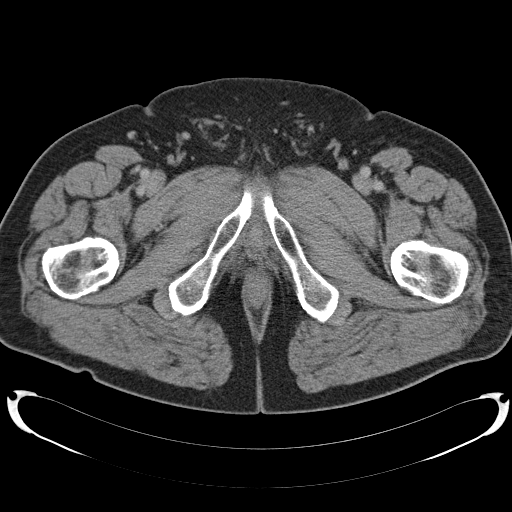
[im 41/82  soft-tissue]
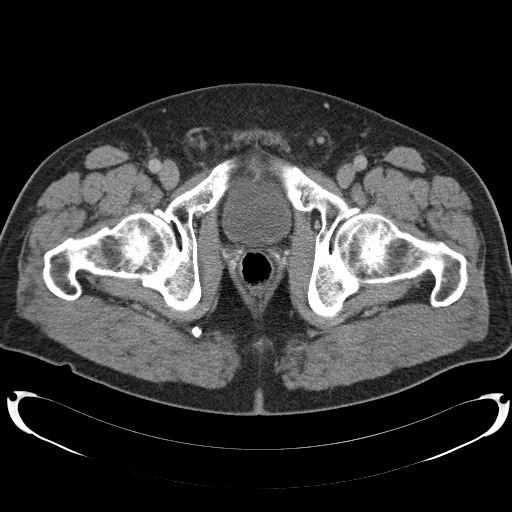
[im 49/82  soft-tissue]
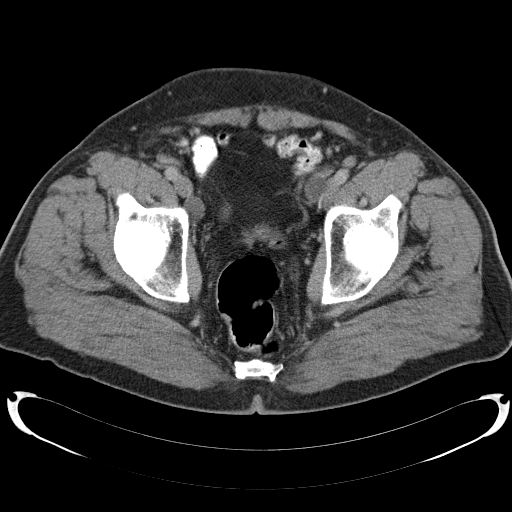
[im 49/82  lung]
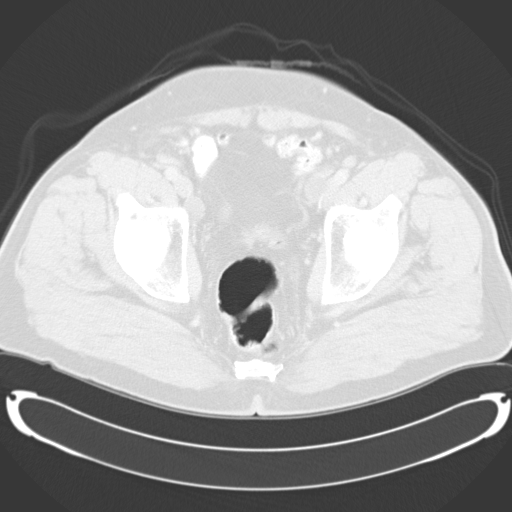
[im 57/82  soft-tissue]
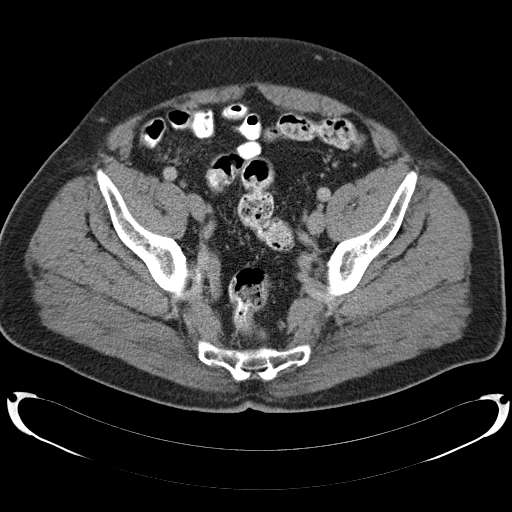
[im 57/82  lung]
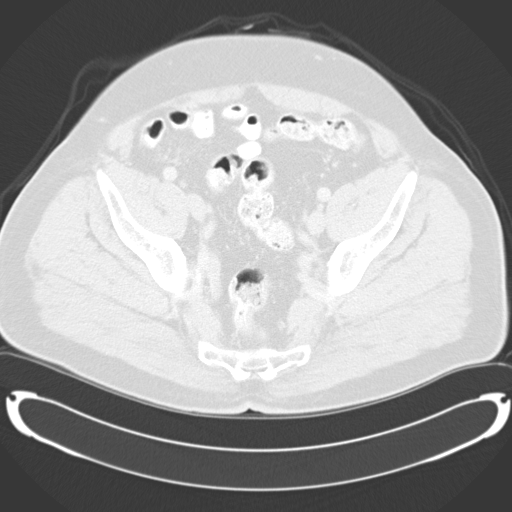
[im 65/82  soft-tissue]
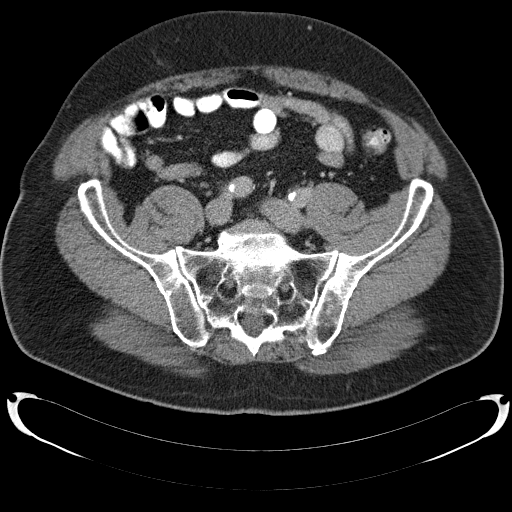
[im 65/82  lung]
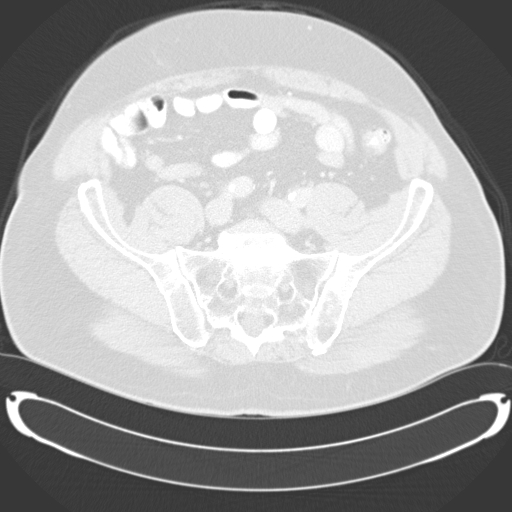
[im 73/82  soft-tissue]
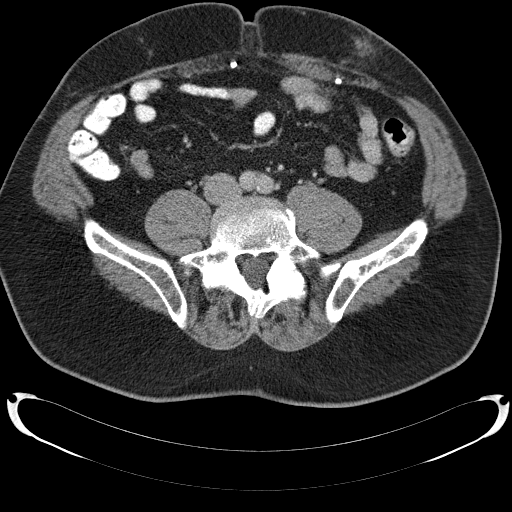
[im 73/82  lung]
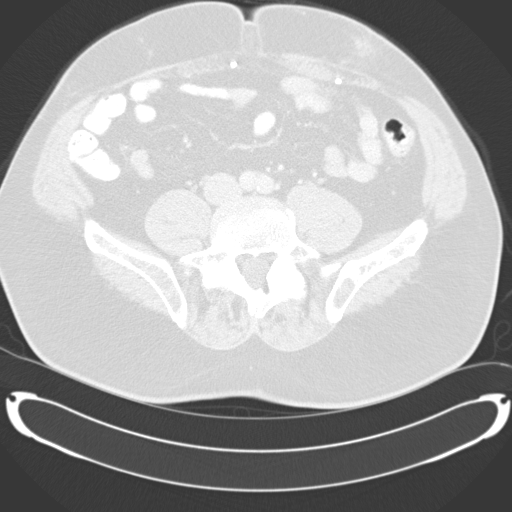
[im 73/82  bone]
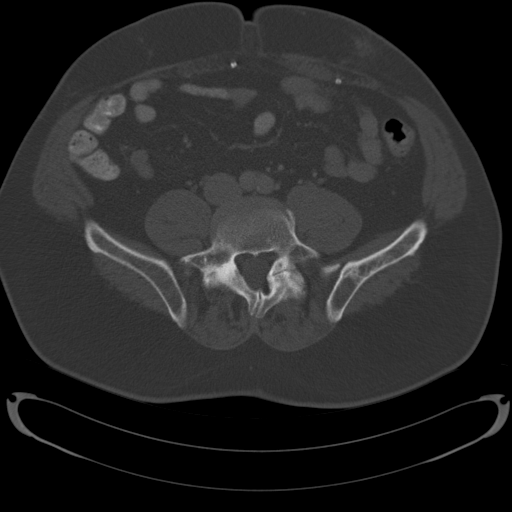

[Series 6: coronal · coronal · 0.71mm/px · 3 of 152 slices shown]
[im 38/152  soft-tissue]
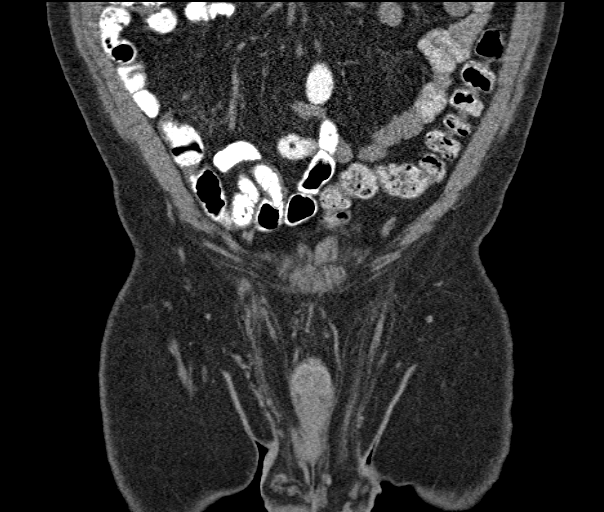
[im 76/152  soft-tissue]
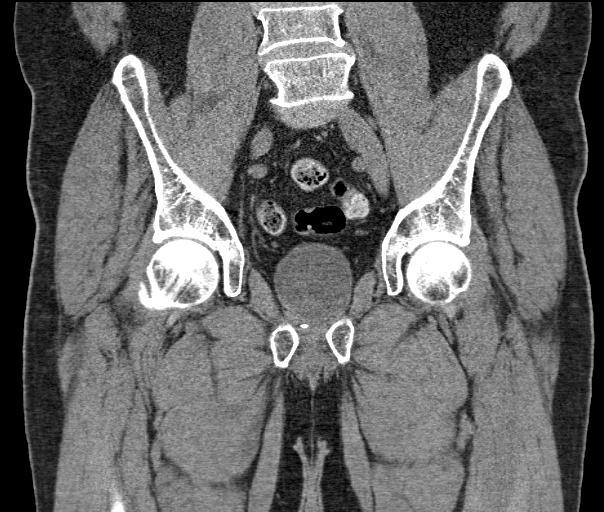
[im 114/152  soft-tissue]
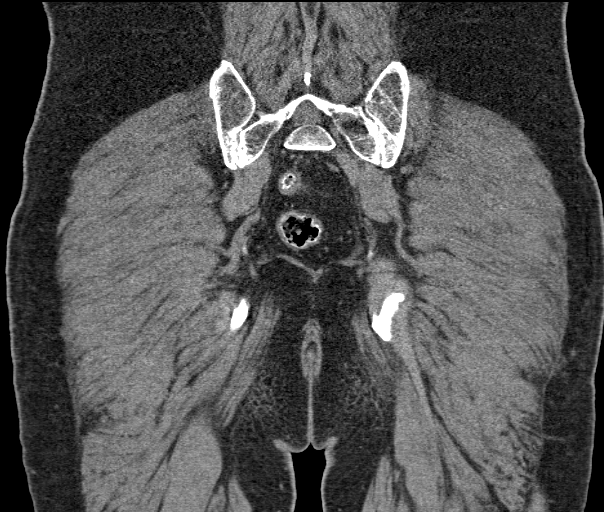

[12 of 46 positions shown; findings below may reference images not displayed]

FINDINGS: Stable surgical changes from a prostatectomy. The bladder appears
normal. No pelvic mass identified. Stable bilateral external iliac
cystic structures which are probably small seromas. These were not
metabolically active on the recent PET scan and measure water
attenuation. There are adjacent small external iliac lymph nodes
bilaterally but these are unchanged since the prior examinations.
Index node on the left on image 33 measures 9 mm an index node on
the right on the same image measures 7 mm. No new nodes. Small
scattered inguinal lymph nodes are stable.

The rectum, sigmoid colon and visualized small bowel loops are
unremarkable. Stable surgical changes from anterior abdominal wall
hernia repair with mesh. No recurrent hernia.

Stable aortic and iliac artery calcifications.  No focal aneurysm.

Stable area of sclerotic change involving the left sacrum. There is
also a stable sclerotic nodule in the upper left sacrum on image 14.
No change in the left iliac crest lesion. These areas are not
positive on the PET scan. There are stable since 2582. This is
likely treated metastatic disease.
IMPRESSION: 1. Stable surgical changes from a prostatectomy.  No pelvic mass.
2. Stable small bilateral external iliac pelvic lymph nodes and
small bilateral probable seromas.
3. Stable sclerotic pelvic bone lesions as discussed above.

## 2018-06-01 DIAGNOSIS — Z8546 Personal history of malignant neoplasm of prostate: Secondary | ICD-10-CM | POA: Diagnosis not present

## 2018-06-01 DIAGNOSIS — N5231 Erectile dysfunction following radical prostatectomy: Secondary | ICD-10-CM | POA: Diagnosis not present

## 2018-06-01 DIAGNOSIS — N393 Stress incontinence (female) (male): Secondary | ICD-10-CM | POA: Diagnosis not present

## 2018-06-25 DIAGNOSIS — N5231 Erectile dysfunction following radical prostatectomy: Secondary | ICD-10-CM | POA: Diagnosis not present

## 2018-06-29 DIAGNOSIS — H26493 Other secondary cataract, bilateral: Secondary | ICD-10-CM | POA: Diagnosis not present

## 2018-08-10 DIAGNOSIS — N210 Calculus in bladder: Secondary | ICD-10-CM | POA: Diagnosis not present

## 2018-08-10 DIAGNOSIS — N393 Stress incontinence (female) (male): Secondary | ICD-10-CM | POA: Diagnosis not present

## 2018-08-10 DIAGNOSIS — Z8546 Personal history of malignant neoplasm of prostate: Secondary | ICD-10-CM | POA: Diagnosis not present

## 2018-11-01 DIAGNOSIS — Z0000 Encounter for general adult medical examination without abnormal findings: Secondary | ICD-10-CM | POA: Diagnosis not present

## 2018-11-13 ENCOUNTER — Other Ambulatory Visit (INDEPENDENT_AMBULATORY_CARE_PROVIDER_SITE_OTHER): Payer: Self-pay | Admitting: Internal Medicine

## 2018-11-25 DIAGNOSIS — E039 Hypothyroidism, unspecified: Secondary | ICD-10-CM | POA: Diagnosis not present

## 2018-11-25 DIAGNOSIS — R7301 Impaired fasting glucose: Secondary | ICD-10-CM | POA: Diagnosis not present

## 2018-12-01 DIAGNOSIS — F519 Sleep disorder not due to a substance or known physiological condition, unspecified: Secondary | ICD-10-CM | POA: Diagnosis not present

## 2018-12-01 DIAGNOSIS — E785 Hyperlipidemia, unspecified: Secondary | ICD-10-CM | POA: Diagnosis not present

## 2018-12-01 DIAGNOSIS — Z23 Encounter for immunization: Secondary | ICD-10-CM | POA: Diagnosis not present

## 2018-12-01 DIAGNOSIS — K219 Gastro-esophageal reflux disease without esophagitis: Secondary | ICD-10-CM | POA: Diagnosis not present

## 2018-12-01 DIAGNOSIS — W57XXXA Bitten or stung by nonvenomous insect and other nonvenomous arthropods, initial encounter: Secondary | ICD-10-CM | POA: Diagnosis not present

## 2018-12-01 DIAGNOSIS — L570 Actinic keratosis: Secondary | ICD-10-CM | POA: Diagnosis not present

## 2018-12-01 DIAGNOSIS — X32XXXD Exposure to sunlight, subsequent encounter: Secondary | ICD-10-CM | POA: Diagnosis not present

## 2018-12-01 DIAGNOSIS — R7301 Impaired fasting glucose: Secondary | ICD-10-CM | POA: Diagnosis not present

## 2018-12-07 DIAGNOSIS — N393 Stress incontinence (female) (male): Secondary | ICD-10-CM | POA: Diagnosis not present

## 2018-12-07 DIAGNOSIS — N5231 Erectile dysfunction following radical prostatectomy: Secondary | ICD-10-CM | POA: Diagnosis not present

## 2018-12-07 DIAGNOSIS — Z8546 Personal history of malignant neoplasm of prostate: Secondary | ICD-10-CM | POA: Diagnosis not present

## 2018-12-09 ENCOUNTER — Encounter (HOSPITAL_BASED_OUTPATIENT_CLINIC_OR_DEPARTMENT_OTHER): Payer: Self-pay

## 2018-12-09 DIAGNOSIS — R0683 Snoring: Secondary | ICD-10-CM

## 2018-12-20 ENCOUNTER — Other Ambulatory Visit: Payer: Self-pay

## 2018-12-20 ENCOUNTER — Ambulatory Visit: Payer: Medicare Other | Attending: Internal Medicine | Admitting: Neurology

## 2018-12-20 DIAGNOSIS — R0683 Snoring: Secondary | ICD-10-CM | POA: Insufficient documentation

## 2018-12-20 DIAGNOSIS — G4733 Obstructive sleep apnea (adult) (pediatric): Secondary | ICD-10-CM | POA: Diagnosis not present

## 2018-12-23 NOTE — Procedures (Signed)
  Alton A. Merlene Laughter, MD     [email or web address]             HOME SLEEP STUDY  LOCATION: [place]  Patient Name: William Burns, William Burns Date: 12/21/2018 Gender: Male D.O.B: 05-Nov-1945 Age (years): 11 Referring Provider: Not Available Height (inches): 74 Interpreting Physician: Phillips Odor MD, ABSM Weight (lbs): 247 RPSGT: Rosebud Poles BMI: 32 MRN: [id number] Neck Size: CLINICAL INFORMATION Sleep Study Type: HST     Indication for sleep study: Snoring     Epworth Sleepiness Score: NA  SLEEP STUDY TECHNIQUE A multi-channel overnight portable sleep study was performed. The channels recorded were: nasal airflow, thoracic respiratory movement, and oxygen saturation with a pulse oximetry. Snoring was also monitored.  MEDICATIONS  Current Outpatient Medications:  .  Cyanocobalamin (VITAMIN B-12 PO), Take 1 tablet by mouth daily., Disp: , Rfl:  .  HYDROcodone-acetaminophen (NORCO) 5-325 MG tablet, Take 1 tablet by mouth every 4 (four) hours as needed for moderate pain., Disp: 40 tablet, Rfl: 0 .  Multiple Vitamins-Minerals (MULTIVITAMIN MEN PO), Take 1 tablet by mouth daily. , Disp: , Rfl:  .  Omega-3 Fatty Acids (FISH OIL PO), Take 1 capsule by mouth daily., Disp: , Rfl:  .  pantoprazole (PROTONIX) 40 MG tablet, TAKE 1 TABLET BY MOUTH ONCE DAILY., Disp: 90 tablet, Rfl: 3 .  Probiotic CAPS, Take 1 capsule by mouth daily. PB8 Supplement, Disp: , Rfl:   SLEEP ARCHITECTURE Patient was studied for 2 minutes. The sleep efficiency was 6.6 % and the patient was supine for 85%. The arousal index was 0.0 per hour.  RESPIRATORY PARAMETERS The overall AHI was 0.0 per hour, with a central apnea index of 0.0 per hour.  The oxygen nadir was 0% during sleep.     CARDIAC DATA Mean heart rate during sleep was bpm.  IMPRESSIONS No significant obstructive sleep apnea occurred during this study (AHI = 0.0/h).   Delano Metz, MD Diplomate, American Board  of Sleep Medicine.  ELECTRONICALLY SIGNED ON:  12/23/2018, 6:41 PM [place] PH: [phone]   FX: [phone] [place]

## 2018-12-28 ENCOUNTER — Other Ambulatory Visit (HOSPITAL_COMMUNITY): Payer: Self-pay | Admitting: Nurse Practitioner

## 2018-12-28 DIAGNOSIS — E041 Nontoxic single thyroid nodule: Secondary | ICD-10-CM

## 2018-12-29 ENCOUNTER — Other Ambulatory Visit (HOSPITAL_COMMUNITY): Payer: Medicare Other

## 2018-12-29 ENCOUNTER — Ambulatory Visit (HOSPITAL_COMMUNITY): Payer: Medicare Other

## 2019-01-03 ENCOUNTER — Ambulatory Visit (HOSPITAL_COMMUNITY): Payer: Medicare Other | Admitting: Hematology

## 2019-01-04 DIAGNOSIS — Z23 Encounter for immunization: Secondary | ICD-10-CM | POA: Diagnosis not present

## 2019-01-04 DIAGNOSIS — E785 Hyperlipidemia, unspecified: Secondary | ICD-10-CM | POA: Diagnosis not present

## 2019-01-04 DIAGNOSIS — F519 Sleep disorder not due to a substance or known physiological condition, unspecified: Secondary | ICD-10-CM | POA: Diagnosis not present

## 2019-01-04 DIAGNOSIS — K219 Gastro-esophageal reflux disease without esophagitis: Secondary | ICD-10-CM | POA: Diagnosis not present

## 2019-01-04 DIAGNOSIS — R7301 Impaired fasting glucose: Secondary | ICD-10-CM | POA: Diagnosis not present

## 2019-01-10 ENCOUNTER — Other Ambulatory Visit (HOSPITAL_COMMUNITY): Payer: Self-pay | Admitting: *Deleted

## 2019-01-10 DIAGNOSIS — E041 Nontoxic single thyroid nodule: Secondary | ICD-10-CM

## 2019-01-10 DIAGNOSIS — C189 Malignant neoplasm of colon, unspecified: Secondary | ICD-10-CM

## 2019-01-11 ENCOUNTER — Ambulatory Visit (HOSPITAL_COMMUNITY)
Admission: RE | Admit: 2019-01-11 | Discharge: 2019-01-11 | Disposition: A | Discharge status: Home / Self Care | Payer: Medicare Other | Source: Ambulatory Visit | Attending: Nurse Practitioner | Admitting: Nurse Practitioner

## 2019-01-11 ENCOUNTER — Inpatient Hospital Stay (HOSPITAL_COMMUNITY): Payer: Medicare Other | Attending: Hematology

## 2019-01-11 ENCOUNTER — Other Ambulatory Visit: Payer: Self-pay

## 2019-01-11 ENCOUNTER — Ambulatory Visit (HOSPITAL_COMMUNITY): Payer: Medicare Other | Admitting: Hematology

## 2019-01-11 DIAGNOSIS — Z87891 Personal history of nicotine dependence: Secondary | ICD-10-CM | POA: Diagnosis not present

## 2019-01-11 DIAGNOSIS — K2960 Other gastritis without bleeding: Secondary | ICD-10-CM | POA: Insufficient documentation

## 2019-01-11 DIAGNOSIS — E041 Nontoxic single thyroid nodule: Secondary | ICD-10-CM | POA: Diagnosis not present

## 2019-01-11 DIAGNOSIS — N400 Enlarged prostate without lower urinary tract symptoms: Secondary | ICD-10-CM | POA: Diagnosis not present

## 2019-01-11 DIAGNOSIS — R911 Solitary pulmonary nodule: Secondary | ICD-10-CM | POA: Diagnosis not present

## 2019-01-11 DIAGNOSIS — K219 Gastro-esophageal reflux disease without esophagitis: Secondary | ICD-10-CM | POA: Insufficient documentation

## 2019-01-11 DIAGNOSIS — Z79899 Other long term (current) drug therapy: Secondary | ICD-10-CM | POA: Diagnosis not present

## 2019-01-11 DIAGNOSIS — Z85038 Personal history of other malignant neoplasm of large intestine: Secondary | ICD-10-CM | POA: Diagnosis not present

## 2019-01-11 DIAGNOSIS — C189 Malignant neoplasm of colon, unspecified: Secondary | ICD-10-CM

## 2019-01-11 DIAGNOSIS — C61 Malignant neoplasm of prostate: Secondary | ICD-10-CM | POA: Diagnosis not present

## 2019-01-11 LAB — COMPREHENSIVE METABOLIC PANEL
ALT: 24 U/L (ref 0–44)
AST: 32 U/L (ref 15–41)
Albumin: 4 g/dL (ref 3.5–5.0)
Alkaline Phosphatase: 59 U/L (ref 38–126)
Anion gap: 9 (ref 5–15)
BUN: 11 mg/dL (ref 8–23)
CO2: 27 mmol/L (ref 22–32)
Calcium: 9 mg/dL (ref 8.9–10.3)
Chloride: 104 mmol/L (ref 98–111)
Creatinine, Ser: 0.91 mg/dL (ref 0.61–1.24)
GFR calc Af Amer: >60 mL/min (ref >60)
GFR calc non Af Amer: >60 mL/min (ref >60)
Glucose, Bld: 94 mg/dL (ref 70–99)
Potassium: 4.1 mmol/L (ref 3.5–5.1)
Sodium: 140 mmol/L (ref 135–145)
Total Bilirubin: 1 mg/dL (ref 0.3–1.2)
Total Protein: 7.2 g/dL (ref 6.5–8.1)

## 2019-01-11 LAB — CBC WITH DIFFERENTIAL/PLATELET
Abs Immature Granulocytes: 0.02 10*3/uL (ref 0.00–0.07)
Basophils Absolute: 0.1 10*3/uL (ref 0.0–0.1)
Basophils Relative: 1 %
Eosinophils Absolute: 0.3 10*3/uL (ref 0.0–0.5)
Eosinophils Relative: 5 %
HCT: 44 % (ref 39.0–52.0)
Hemoglobin: 14.1 g/dL (ref 13.0–17.0)
Immature Granulocytes: 0 %
Lymphocytes Relative: 19 %
Lymphs Abs: 1.4 10*3/uL (ref 0.7–4.0)
MCH: 29.1 pg (ref 26.0–34.0)
MCHC: 32 g/dL (ref 30.0–36.0)
MCV: 90.9 fL (ref 80.0–100.0)
Monocytes Absolute: 0.4 10*3/uL (ref 0.1–1.0)
Monocytes Relative: 6 %
Neutro Abs: 4.8 10*3/uL (ref 1.7–7.7)
Neutrophils Relative %: 69 %
Platelets: 268 10*3/uL (ref 150–400)
RBC: 4.84 MIL/uL (ref 4.22–5.81)
RDW: 13.5 % (ref 11.5–15.5)
WBC: 7.1 10*3/uL (ref 4.0–10.5)
nRBC: 0 % (ref 0.0–0.2)

## 2019-01-12 LAB — CEA: CEA: 4.3 ng/mL (ref 0.0–4.7)

## 2019-01-13 ENCOUNTER — Inpatient Hospital Stay (HOSPITAL_BASED_OUTPATIENT_CLINIC_OR_DEPARTMENT_OTHER): Payer: Medicare Other | Admitting: Hematology

## 2019-01-13 ENCOUNTER — Encounter (HOSPITAL_COMMUNITY): Payer: Self-pay | Admitting: Hematology

## 2019-01-13 ENCOUNTER — Other Ambulatory Visit: Payer: Self-pay

## 2019-01-13 VITALS — BP 153/72 | HR 75 | Temp 97.9°F | Resp 16 | Wt 248.4 lb

## 2019-01-13 DIAGNOSIS — Z85038 Personal history of other malignant neoplasm of large intestine: Secondary | ICD-10-CM | POA: Diagnosis not present

## 2019-01-13 DIAGNOSIS — E041 Nontoxic single thyroid nodule: Secondary | ICD-10-CM

## 2019-01-13 DIAGNOSIS — K219 Gastro-esophageal reflux disease without esophagitis: Secondary | ICD-10-CM | POA: Diagnosis not present

## 2019-01-13 DIAGNOSIS — R911 Solitary pulmonary nodule: Secondary | ICD-10-CM | POA: Diagnosis not present

## 2019-01-13 DIAGNOSIS — C61 Malignant neoplasm of prostate: Secondary | ICD-10-CM | POA: Diagnosis not present

## 2019-01-13 DIAGNOSIS — N400 Enlarged prostate without lower urinary tract symptoms: Secondary | ICD-10-CM | POA: Diagnosis not present

## 2019-01-13 NOTE — Patient Instructions (Addendum)
[place] at [facility] Discharge Instructions  You were seen today by Dr. Delton Coombes. He went over your recent lab results. He will get you scheduled for a thyroid biopsy. He will see you back after your biopsy for follow up.   Thank you for choosing [place] at [facility] to provide your oncology and hematology care.  To afford each patient quality time with our provider, please arrive at least 15 minutes before your scheduled appointment time.   If you have a lab appointment with the [facility] please come in thru the  Main Entrance and check in at the main information desk  You need to re-schedule your appointment should you arrive 10 or more minutes late.  We strive to give you quality time with our providers, and arriving late affects you and other patients whose appointments are after yours.  Also, if you no show three or more times for appointments you may be dismissed from the clinic at the providers discretion.     Again, thank you for choosing [facility].  Our hope is that these requests will decrease the amount of time that you wait before being seen by our physicians.       _____________________________________________________________  Should you have questions after your visit to [facility], please contact our office at [phone] between the hours of 8:00 a.m. and 4:30 p.m.  Voicemails left after 4:00 p.m. will not be returned until the following business day.  For prescription refill requests, have your pharmacy contact our office and allow 72 hours.    Cancer Center Support Programs:   > Cancer Support Group  2nd Tuesday of the month 1pm-2pm, Journey Room

## 2019-01-13 NOTE — Progress Notes (Signed)
William Burns, [name] [address]   CLINIC:  Medical Oncology/Hematology  PCP:  Celene Squibb, MD [name] [address] [phone]   REASON FOR VISIT:  Follow-up for cecal and prostate cancer  CURRENT THERAPY: Observation   INTERVAL HISTORY:  William Burns 73 y.o. male seen for follow-up of his prior cancers and ultrasound of the neck.  Denies any difficulty swallowing or painful swallowing.  Denies any nausea, vomiting, diarrhea or constipation.  Denies any fevers, night sweats or weight loss in the last 6 months.  No ER visits or hospitalizations.  No bleeding per rectum or melena.  No change in bowel habits.    REVIEW OF SYSTEMS:  Review of Systems  All other systems reviewed and are negative.    PAST MEDICAL/SURGICAL HISTORY:  Past Medical History:  Diagnosis Date   BPH (benign prostatic hyperplasia)    Cancer ([place])    colon   Cataract    Colonic mass 07/03/2011   Difficult intubation    extra long tube for intub per wife; states "his neck is longer than regular people" and slow to wake up    Erosive gastritis 07/03/2011   GERD (gastroesophageal reflux disease)    History of kidney stones    Kidney stones 2011   Renal calculus    Tooth infection 07/03/2011   Past Surgical History:  Procedure Laterality Date   CATARACT EXTRACTION W/PHACO Left 08/23/2012   Procedure: CATARACT EXTRACTION PHACO AND INTRAOCULAR LENS PLACEMENT ([place]);  Surgeon: Tonny Branch, MD;  Location: [facility];  Service: Ophthalmology;  Laterality: Left;  CDE 16.64   CATARACT EXTRACTION W/PHACO Right 06/09/2013   Procedure: CATARACT EXTRACTION PHACO AND INTRAOCULAR LENS PLACEMENT (I[facility]) CDE=10.86;  Surgeon: Tonny Branch, MD;  Location: [facility];  Service: Ophthalmology;  Laterality: Right;   COLON SURGERY     COLONOSCOPY  07/03/2011   Procedure: COLONOSCOPY;  Surgeon: Rogene Houston, MD;  Location: [facility];  Service: Endoscopy;   Laterality: N/A;   COLONOSCOPY N/A 09/16/2012   Procedure: COLONOSCOPY;  Surgeon: Rogene Houston, MD;  Location: [facility];  Service: Endoscopy;  Laterality: N/A;  930   COLONOSCOPY N/A 12/05/2015   Procedure: COLONOSCOPY;  Surgeon: Rogene Houston, MD;  Location: [facility];  Service: Endoscopy;  Laterality: N/A;  1200   CYSTOSCOPY  07/09/2011   Procedure: CYSTOSCOPY FLEXIBLE;  Surgeon: Marissa Nestle, MD;  Location: [facility];  Service: Urology;  Laterality: N/A;   CYSTOSCOPY WITH LITHOLAPAXY  07/11/2011   Procedure: CYSTOSCOPY WITH LITHOLAPAXY;  Surgeon: Marissa Nestle, MD;  Location: [facility];  Service: Urology;  Laterality: N/A;   ESOPHAGOGASTRODUODENOSCOPY  07/03/2011   Procedure: ESOPHAGOGASTRODUODENOSCOPY (EGD);  Surgeon: Rogene Houston, MD;  Location[facility];  Service: Endoscopy;  Laterality: N/A;   HERNIA REPAIR  [id number]    umbilical    INGUINAL HERNIA REPAIR Right 05/04/2017   Procedure: HERNIA REPAIR INGUINAL ADULT WITH MESH;  Surgeon: Aviva Signs, MD;  Location: [facility];  Service: General;  Laterality: Right;   PARTIAL COLECTOMY  07/04/2011   Procedure: PARTIAL COLECTOMY;  Surgeon: Jamesetta So, MD;  Location: [facility];  Service: General;  Laterality: N/A;   Prostage cancer     PROSTATE BIOPSY     ROBOT ASSISTED LAPAROSCOPIC RADICAL PROSTATECTOMY Bilateral 01/19/2015   Procedure: ROBOTIC ASSISTED LAPAROSCOPIC RADICAL PROSTATECTOMY AND BILATERAL PELVIC LYMPH NODE DISSECTION, LAPARASCOPIC  LYSIS OF ADHESIONS;  Surgeon: Marland Kitchen  Ardis Hughs, MD;  Location: [facility];  Service: Urology;  Laterality: Bilateral;   TRANSURETHRAL RESECTION OF PROSTATE  07/11/2011   Procedure: TRANSURETHRAL RESECTION OF THE PROSTATE (TURP);  Surgeon: Marissa Nestle, MD;  Location: [facility];  Service: Urology;  Laterality: N/A;     SOCIAL HISTORY:  Social History   Socioeconomic History   Marital status: Married    Spouse name: Not on file   Number of children: Not on file   Years of  education: Not on file   Highest education level: Not on file  Occupational History   Occupation: farmer    Comment: tobacco; cattle; Environmental health practitioner strain: Not on file   Food insecurity    Worry: Not on file    Inability: Not on file   Transportation needs    Medical: Not on file    Non-medical: Not on file  Tobacco Use   Smoking status: Former Smoker    Packs/day: 2.00    Years: 25.00    Pack years: 50.00    Types: Cigarettes    Quit date: 06/02/1989    Years since quitting: 29.6   Smokeless tobacco: Never Used  Substance and Sexual Activity   Alcohol use: No   Drug use: No   Sexual activity: Not Currently  Lifestyle   Physical activity    Days per week: Not on file    Minutes per session: Not on file   Stress: Not on file  Relationships   Social connections    Talks on phone: Not on file    Gets together: Not on file    Attends religious service: Not on file    Active member of club or organization: Not on file    Attends meetings of clubs or organizations: Not on file    Relationship status: Not on file   Intimate partner violence    Fear of current or ex partner: Not on file    Emotionally abused: Not on file    Physically abused: Not on file    Forced sexual activity: Not on file  Other Topics Concern   Not on file  Social History Narrative   Not on file    FAMILY HISTORY:  Family History  Problem Relation Age of Onset   Alzheimer's disease Mother    Alzheimer's disease Father    Aneurysm Brother        brain   Anuerysm Brother    Healthy Son    Healthy Son    Colon cancer Neg Hx     CURRENT MEDICATIONS:  Outpatient Encounter Medications as of 01/13/2019  Medication Sig   Cyanocobalamin (VITAMIN B-12 PO) Take 1 tablet by mouth daily.   memantine (NAMENDA) 5 MG tablet    Multiple Vitamins-Minerals (MULTIVITAMIN MEN PO) Take 1 tablet by mouth daily.    Omega-3 Fatty Acids (FISH OIL PO) Take 1  capsule by mouth daily.   pantoprazole (PROTONIX) 40 MG tablet TAKE 1 TABLET BY MOUTH ONCE DAILY.   Probiotic CAPS Take 1 capsule by mouth daily. PB8 Supplement   [DISCONTINUED] HYDROcodone-acetaminophen (NORCO) 5-325 MG tablet Take 1 tablet by mouth every 4 (four) hours as needed for moderate pain.   No facility-administered encounter medications on file as of 01/13/2019.     ALLERGIES:  No Known Allergies   PHYSICAL EXAM:  ECOG Performance status: 1  Vitals:   01/13/19 1500  BP: (!) 153/72  Pulse: 75  Resp: 16  Temp: 97.9 F (36.6 C)  SpO2: 96%   Filed Weights   01/13/19 1500  Weight: 248 lb 6 oz (112.7 kg)    Physical Exam Constitutional:      Appearance: He is well-developed.  Neck:     Musculoskeletal: Normal range of motion and neck supple.  Cardiovascular:     Rate and Rhythm: Normal rate and regular rhythm.     Heart sounds: Normal heart sounds.  Pulmonary:     Effort: Pulmonary effort is normal.     Breath sounds: Normal breath sounds.  Abdominal:     General: There is no distension.     Palpations: Abdomen is soft. There is no mass.  Musculoskeletal: Normal range of motion.  Lymphadenopathy:     Cervical: No cervical adenopathy.  Skin:    General: Skin is warm.  Neurological:     General: No focal deficit present.     Mental Status: He is alert and oriented to person, place, and time.  Psychiatric:        Mood and Affect: Mood normal.        Behavior: Behavior normal.      LABORATORY DATA:  I have reviewed the labs as listed.  CBC    Component Value Date/Time   WBC 7.1 01/11/2019 1157   RBC 4.84 01/11/2019 1157   HGB 14.1 01/11/2019 1157   HCT 44.0 01/11/2019 1157   PLT 268 01/11/2019 1157   MCV 90.9 01/11/2019 1157   MCH 29.1 01/11/2019 1157   MCHC 32.0 01/11/2019 1157   RDW 13.5 01/11/2019 1157   LYMPHSABS 1.4 01/11/2019 1157   MONOABS 0.4 01/11/2019 1157   EOSABS 0.3 01/11/2019 1157   BASOSABS 0.1 01/11/2019 1157   CMP Latest  Ref Rng & Units 01/11/2019 04/30/2017 12/16/2016  Glucose 70 - 99 mg/dL 94 132(H) 90  BUN 8 - 23 mg/dL 11 17 11   Creatinine 0.61 - 1.24 mg/dL 0.91 0.92 1.01  Sodium 135 - 145 mmol/L 140 139 138  Potassium 3.5 - 5.1 mmol/L 4.1 3.5 4.0  Chloride 98 - 111 mmol/L 104 105 102  CO2 22 - 32 mmol/L 27 26 27   Calcium 8.9 - 10.3 mg/dL 9.0 8.9 9.0  Total Protein 6.5 - 8.1 g/dL 7.2 - 7.5  Total Bilirubin 0.3 - 1.2 mg/dL 1.0 - 0.7  Alkaline Phos 38 - 126 U/L 59 - 70  AST 15 - 41 U/L 32 - 45(H)  ALT 0 - 44 U/L 24 - 36       DIAGNOSTIC IMAGING:  I have personally reviewed scans and discussed with the patient.    ASSESSMENT & PLAN:   Left thyroid nodule 1.  Left thyroid nodule: - We discussed results of the thyroid ultrasound which showed 1.8 cm moderately suspicious mid left nodule. - I have recommended FNA biopsy.  Patient is agreeable. -We will see him back after the biopsy to discuss results.  2.  Cecal cancer: -Status post right hemicolectomy in February 2013 for a T3N0 disease.  No adjuvant chemotherapy was recommended. - CT [facility] on 12/17/2016 showed stable pelvic nodes, which were negative on PET scan. - CEA on 01/11/2019 was 4.3.  No further scans are needed.  3.  Prostate cancer: -Underwent radical prostatectomy on 01/19/2015 by Dr. Louis Meckel at [facility] urology.  He follows up with him and is in remission.  4.  Lung nodule: - CT chest dated 12/21/2017 showed stable 4 mm right lower lobe lung nodule.  No further scans are  recommended as it has been staying stable since July 2018.    Total time spent is 25 minutes with more than 50% of the time spent face-to-face discussing scan results, further plan of action, counseling and coordination of care.    Orders placed this encounter:  Orders Placed This Encounter  Procedures   [place] [facility] [place], [place] [phone]

## 2019-01-13 NOTE — Assessment & Plan Note (Signed)
1.  Left thyroid nodule: - We discussed results of the thyroid ultrasound which showed 1.8 cm moderately suspicious mid left nodule. - I have recommended FNA biopsy.  Patient is agreeable. -We will see him back after the biopsy to discuss results.  2.  Cecal cancer: -Status post right hemicolectomy in February 2013 for a T3N0 disease.  No adjuvant chemotherapy was recommended. - CT [facility] on 12/17/2016 showed stable pelvic nodes, which were negative on PET scan. - CEA on 01/11/2019 was 4.3.  No further scans are needed.  3.  Prostate cancer: -Underwent radical prostatectomy on 01/19/2015 by Dr. Louis Meckel at [facility] urology.  He follows up with him and is in remission.  4.  Lung nodule: - CT chest dated 12/21/2017 showed stable 4 mm right lower lobe lung nodule.  No further scans are recommended as it has been staying stable since July 2018.

## 2019-01-20 ENCOUNTER — Other Ambulatory Visit: Payer: Self-pay

## 2019-01-20 ENCOUNTER — Encounter (HOSPITAL_COMMUNITY): Payer: Self-pay

## 2019-01-20 ENCOUNTER — Ambulatory Visit (HOSPITAL_COMMUNITY)
Admission: RE | Admit: 2019-01-20 | Discharge: 2019-01-20 | Disposition: A | Discharge status: Home / Self Care | Payer: Medicare Other | Source: Ambulatory Visit | Attending: Hematology | Admitting: Hematology

## 2019-01-20 DIAGNOSIS — E041 Nontoxic single thyroid nodule: Secondary | ICD-10-CM | POA: Diagnosis not present

## 2019-01-20 MED ORDER — LIDOCAINE HCL (PF) 2 % IJ SOLN
INTRAMUSCULAR | Status: AC
  Administered 2019-01-20: 10:00:00
  Filled 2019-01-20: qty 10

## 2019-01-20 NOTE — Procedures (Signed)
PreOperative Dx: LEFT thyroid nodule Postoperative Dx: LEFT thyroid nodule Procedure:   US guided FNA of LEFT thyroid nodule Radiologist:  Thornton Papas Anesthesia:  3 ml of 2% lidocaine Specimen:  FNA x 6 (3 cytology, 3 Afirma)  EBL:   < 1 ml Complications: None

## 2019-02-01 ENCOUNTER — Inpatient Hospital Stay (HOSPITAL_COMMUNITY): Payer: Medicare Other | Attending: Hematology | Admitting: Hematology

## 2019-02-01 ENCOUNTER — Encounter (HOSPITAL_COMMUNITY): Payer: Self-pay | Admitting: Hematology

## 2019-02-01 ENCOUNTER — Other Ambulatory Visit: Payer: Self-pay

## 2019-02-01 VITALS — BP 147/69 | HR 61 | Temp 97.3°F | Resp 18 | Wt 248.0 lb

## 2019-02-01 DIAGNOSIS — K219 Gastro-esophageal reflux disease without esophagitis: Secondary | ICD-10-CM | POA: Diagnosis not present

## 2019-02-01 DIAGNOSIS — C189 Malignant neoplasm of colon, unspecified: Secondary | ICD-10-CM

## 2019-02-01 DIAGNOSIS — C180 Malignant neoplasm of cecum: Secondary | ICD-10-CM | POA: Insufficient documentation

## 2019-02-01 DIAGNOSIS — Z87891 Personal history of nicotine dependence: Secondary | ICD-10-CM | POA: Insufficient documentation

## 2019-02-01 DIAGNOSIS — C61 Malignant neoplasm of prostate: Secondary | ICD-10-CM | POA: Diagnosis not present

## 2019-02-01 DIAGNOSIS — Z79899 Other long term (current) drug therapy: Secondary | ICD-10-CM | POA: Diagnosis not present

## 2019-02-01 DIAGNOSIS — N400 Enlarged prostate without lower urinary tract symptoms: Secondary | ICD-10-CM | POA: Insufficient documentation

## 2019-02-01 DIAGNOSIS — Z9079 Acquired absence of other genital organ(s): Secondary | ICD-10-CM | POA: Diagnosis not present

## 2019-02-01 DIAGNOSIS — R911 Solitary pulmonary nodule: Secondary | ICD-10-CM | POA: Insufficient documentation

## 2019-02-01 NOTE — Patient Instructions (Signed)
[place] at [facility] Discharge Instructions  You were seen today by Dr. Delton Coombes. He went over your recent biopsy results. He will see you back in 1 year for labs and follow up.   Thank you for choosing [place] at [facility] to provide your oncology and hematology care.  To afford each patient quality time with our provider, please arrive at least 15 minutes before your scheduled appointment time.   If you have a lab appointment with the [facility] please come in thru the  Main Entrance and check in at the main information desk  You need to re-schedule your appointment should you arrive 10 or more minutes late.  We strive to give you quality time with our providers, and arriving late affects you and other patients whose appointments are after yours.  Also, if you no show three or more times for appointments you may be dismissed from the clinic at the providers discretion.     Again, thank you for choosing [facility].  Our hope is that these requests will decrease the amount of time that you wait before being seen by our physicians.       _____________________________________________________________  Should you have questions after your visit to [facility], please contact our office at [phone] between the hours of 8:00 a.m. and 4:30 p.m.  Voicemails left after 4:00 p.m. will not be returned until the following business day.  For prescription refill requests, have your pharmacy contact our office and allow 72 hours.    Cancer Center Support Programs:   > Cancer Support Group  2nd Tuesday of the month 1pm-2pm, Journey Room

## 2019-02-01 NOTE — Progress Notes (Signed)
[place], [place] [address]   CLINIC:  Medical Oncology/Hematology  PCP:  Celene Squibb, MD [place] [address] [phone]   REASON FOR VISIT:  Follow-up for cecal and prostate cancer  CURRENT THERAPY: Observation   INTERVAL HISTORY:  Mr. William Burns 73 y.o. male seen for follow-up of ultrasound-guided biopsy of the thyroid nodule.  Denies any dysphagia or odynophagia.  Appetite is 100%.  Energy levels are 100%.  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  Denies any nausea, vomiting, diarrhea or constipation.  No bleeding per rectum or melena.  No new onset bone pains.  No trouble with urination.   REVIEW OF SYSTEMS:  Review of Systems  All other systems reviewed and are negative.    PAST MEDICAL/SURGICAL HISTORY:  Past Medical History:  Diagnosis Date  . BPH (benign prostatic hyperplasia)   . Cancer ([facility])    colon  . Cataract   . Colonic mass 07/03/2011  . Difficult intubation    extra long tube for intub per wife; states "his neck is longer than regular people" and slow to wake up   . Erosive gastritis 07/03/2011  . GERD (gastroesophageal reflux disease)   . History of kidney stones   . Kidney stones 2011  . Renal calculus   . Tooth infection 07/03/2011   Past Surgical History:  Procedure Laterality Date  . CATARACT EXTRACTION W/PHACO Left 08/23/2012   Procedure: CATARACT EXTRACTION PHACO AND INTRAOCULAR LENS PLACEMENT (IOC);  Surgeon: Tonny Branch, MD;  Location: [facility];  Service: Ophthalmology;  Laterality: Left;  CDE 16.64  . CATARACT EXTRACTION W/PHACO Right 06/09/2013   Procedure: CATARACT EXTRACTION PHACO AND INTRAOCULAR LENS PLACEMENT (IOC) CDE=10.86;  Surgeon: Tonny Branch, MD;  Location: [facility];  Service: Ophthalmology;  Laterality: Right;  . COLON SURGERY    . COLONOSCOPY  07/03/2011   Procedure: COLONOSCOPY;  Surgeon: Rogene Houston, MD;  Location: [facility];  Service: Endoscopy;  Laterality: N/A;   . COLONOSCOPY N/A 09/16/2012   Procedure: COLONOSCOPY;  Surgeon: Rogene Houston, MD;  [facility];  Service: Endoscopy;  Laterality: N/A;  930  . COLONOSCOPY N/A 12/05/2015   Procedure: COLONOSCOPY;  Surgeon: Rogene Houston, MD;  Location: [facility];  Service: Endoscopy;  Laterality: N/A;  1200  . CYSTOSCOPY  07/09/2011   Procedure: CYSTOSCOPY FLEXIBLE;  Surgeon: Marissa Nestle, MD;  Location: [facility];  Service: Urology;  Laterality: N/A;  . CYSTOSCOPY WITH LITHOLAPAXY  07/11/2011   Procedure: CYSTOSCOPY WITH LITHOLAPAXY;  Surgeon: Marissa Nestle, MD;  Location: [facility];  Service: Urology;  Laterality: N/A;  . ESOPHAGOGASTRODUODENOSCOPY  07/03/2011   Procedure: ESOPHAGOGASTRODUODENOSCOPY (EGD);  Surgeon: Rogene Houston, MD;  [facility];  Service: Endoscopy;  Laterality: N/A;  . HERNIA REPAIR  [id number]    umbilical   . INGUINAL HERNIA REPAIR Right 05/04/2017   Procedure: HERNIA REPAIR INGUINAL ADULT WITH MESH;  Surgeon: Aviva Signs, MD;  Location: [facility];  Service: General;  Laterality: Right;  . PARTIAL COLECTOMY  07/04/2011   Procedure: PARTIAL COLECTOMY;  Surgeon: Jamesetta So, MD;  Location: [facility];  Service: General;  Laterality: N/A;  . Prostage cancer    . PROSTATE BIOPSY    . ROBOT ASSISTED LAPAROSCOPIC RADICAL PROSTATECTOMY Bilateral 01/19/2015   Procedure: ROBOTIC ASSISTED LAPAROSCOPIC RADICAL PROSTATECTOMY AND BILATERAL PELVIC LYMPH NODE DISSECTION, LAPARASCOPIC  LYSIS OF ADHESIONS;  Surgeon: Ardis Hughs,  MD;  Location: [facility];  Service: Urology;  Laterality: Bilateral;  . TRANSURETHRAL RESECTION OF PROSTATE  07/11/2011   Procedure: TRANSURETHRAL RESECTION OF THE PROSTATE (TURP);  Surgeon: Marissa Nestle, MD;  Location: [facility];  Service: Urology;  Laterality: N/A;     SOCIAL HISTORY:  Social History   Socioeconomic History  . Marital status: Married    Spouse name: Not on file  . Number of children: Not on file  . Years of education: Not on  file  . Highest education level: Not on file  Occupational History  . Occupation: farmer    Comment: tobacco; cattle; Sales executive  Social Needs  . Financial resource strain: Not on file  . Food insecurity    Worry: Not on file    Inability: Not on file  . Transportation needs    Medical: Not on file    Non-medical: Not on file  Tobacco Use  . Smoking status: Former Smoker    Packs/day: 2.00    Years: 25.00    Pack years: 50.00    Types: Cigarettes    Quit date: 06/02/1989    Years since quitting: 29.6  . Smokeless tobacco: Never Used  Substance and Sexual Activity  . Alcohol use: No  . Drug use: No  . Sexual activity: Not Currently  Lifestyle  . Physical activity    Days per week: Not on file    Minutes per session: Not on file  . Stress: Not on file  Relationships  . Social Herbalist on phone: Not on file    Gets together: Not on file    Attends religious service: Not on file    Active member of club or organization: Not on file    Attends meetings of clubs or organizations: Not on file    Relationship status: Not on file  . Intimate partner violence    Fear of current or ex partner: Not on file    Emotionally abused: Not on file    Physically abused: Not on file    Forced sexual activity: Not on file  Other Topics Concern  . Not on file  Social History Narrative  . Not on file    FAMILY HISTORY:  Family History  Problem Relation Age of Onset  . Alzheimer's disease Mother   . Alzheimer's disease Father   . Aneurysm Brother        brain  . Anuerysm Brother   . Healthy Son   . Healthy Son   . Colon cancer Neg Hx     CURRENT MEDICATIONS:  Outpatient Encounter Medications as of 02/01/2019  Medication Sig  . Cyanocobalamin (VITAMIN B-12 PO) Take 1 tablet by mouth daily.  . memantine (NAMENDA) 5 MG tablet   . Multiple Vitamins-Minerals (MULTIVITAMIN MEN PO) Take 1 tablet by mouth daily.   . Omega-3 Fatty Acids (FISH OIL PO) Take 1 capsule by mouth  daily.  . pantoprazole (PROTONIX) 40 MG tablet TAKE 1 TABLET BY MOUTH ONCE DAILY.  . Probiotic CAPS Take 1 capsule by mouth daily. PB8 Supplement   No facility-administered encounter medications on file as of 02/01/2019.     ALLERGIES:  No Known Allergies   PHYSICAL EXAM:  ECOG Performance status: 1  Vitals:   02/01/19 1214  BP: (!) 147/69  Pulse: 61  Resp: 18  Temp: (!) 97.3 F (36.3 C)  SpO2: 100%   Filed Weights   02/01/19 1214  Weight: 248 lb (112.5 kg)  Physical Exam Constitutional:      Appearance: He is well-developed.  Neck:     Musculoskeletal: Normal range of motion and neck supple.  Cardiovascular:     Rate and Rhythm: Normal rate and regular rhythm.     Heart sounds: Normal heart sounds.  Pulmonary:     Effort: Pulmonary effort is normal.     Breath sounds: Normal breath sounds.  Abdominal:     General: There is no distension.     Palpations: Abdomen is soft. There is no mass.  Musculoskeletal: Normal range of motion.  Lymphadenopathy:     Cervical: No cervical adenopathy.  Skin:    General: Skin is warm.  Neurological:     General: No focal deficit present.     Mental Status: He is alert and oriented to person, place, and time.  Psychiatric:        Mood and Affect: Mood normal.        Behavior: Behavior normal.      LABORATORY DATA:  I have reviewed the labs as listed.  CBC    Component Value Date/Time   WBC 7.1 01/11/2019 1157   RBC 4.84 01/11/2019 1157   HGB 14.1 01/11/2019 1157   HCT 44.0 01/11/2019 1157   PLT 268 01/11/2019 1157   MCV 90.9 01/11/2019 1157   MCH 29.1 01/11/2019 1157   MCHC 32.0 01/11/2019 1157   RDW 13.5 01/11/2019 1157   LYMPHSABS 1.4 01/11/2019 1157   MONOABS 0.4 01/11/2019 1157   EOSABS 0.3 01/11/2019 1157   BASOSABS 0.1 01/11/2019 1157   CMP Latest Ref Rng & Units 01/11/2019 04/30/2017 12/16/2016  Glucose 70 - 99 mg/dL 94 132(H) 90  BUN 8 - 23 mg/dL 11 17 11   Creatinine 0.61 - 1.24 mg/dL 0.91 0.92 1.01   Sodium 135 - 145 mmol/L 140 139 138  Potassium 3.5 - 5.1 mmol/L 4.1 3.5 4.0  Chloride 98 - 111 mmol/L 104 105 102  CO2 22 - 32 mmol/L 27 26 27   Calcium 8.9 - 10.3 mg/dL 9.0 8.9 9.0  Total Protein 6.5 - 8.1 g/dL 7.2 - 7.5  Total Bilirubin 0.3 - 1.2 mg/dL 1.0 - 0.7  Alkaline Phos 38 - 126 U/L 59 - 70  AST 15 - 41 U/L 32 - 45(H)  ALT 0 - 44 U/L 24 - 36       DIAGNOSTIC IMAGING:  I have personally reviewed scans and discussed with the patient.    ASSESSMENT & PLAN:   Adenocarcinoma of colon 1.  Cecal cancer: - Right hemicolectomy in February 2013 for a T3N0 disease.  No adjuvant chemotherapy was recommended. -Last CT scan of [facility] on 12/17/2016 showing stable pelvic nodes, which were negative on PET scan. -Last CEA was within normal limits. -No further scans needed.  He does not report any change in bowel habits.  We will see him back in 1 year with repeat CEA level. -Last colonoscopy was on 12/05/2015 which showed normal ileum, 5 mm polyp in the transverse colon, small polyps in the rectosigmoid colon, diverticulosis of the sigmoid colon and external hemorrhoids.  2.  Prostate cancer: -Underwent radical prostatectomy on 01/19/2015 by Dr. Louis Meckel with [facility]. -Reportedly PSA has been staying undetectable since then. -We will see him back in 1 year and repeat PSA.  3.  Left thyroid nodule: - Recent CT scan showed thyroid nodules. - Ultrasound of the thyroid on 01/11/2019 showed 1.8 cm mid left nodule. - FNA biopsy on 01/20/2019 consistent with benign  follicular nodule.  4.  Lung nodule: - CT chest dated 12/21/2017 showed stable 4 mm right lower lobe lung nodule.  No further scans recommended as it has been staying stable since July 2018.  Total time spent is 25 minutes with more than 50% of the time spent face-to-face discussing biopsy results, follow-up care, and counseling.    Orders placed this encounter:  Orders Placed This Encounter  Procedures  . CBC with  Differential/Platelet  . Comprehensive metabolic panel  . PSA  . CEA      Derek Jack, MD [place] [phone]

## 2019-02-01 NOTE — Assessment & Plan Note (Signed)
1.  Cecal cancer: - Right hemicolectomy in February 2013 for a T3N0 disease.  No adjuvant chemotherapy was recommended. -Last CT scan of [facility] on 12/17/2016 showing stable pelvic nodes, which were negative on PET scan. -Last CEA was within normal limits. -No further scans needed.  He does not report any change in bowel habits.  We will see him back in 1 year with repeat CEA level. -Last colonoscopy was on 12/05/2015 which showed normal ileum, 5 mm polyp in the transverse colon, small polyps in the rectosigmoid colon, diverticulosis of the sigmoid colon and external hemorrhoids.  2.  Prostate cancer: -Underwent radical prostatectomy on 01/19/2015 by Dr. Louis Meckel with [facility]. -Reportedly PSA has been staying undetectable since then. -We will see him back in 1 year and repeat PSA.  3.  Left thyroid nodule: - Recent CT scan showed thyroid nodules. - Ultrasound of the thyroid on 01/11/2019 showed 1.8 cm mid left nodule. - FNA biopsy on 01/20/2019 consistent with benign follicular nodule.  4.  Lung nodule: - CT chest dated 12/21/2017 showed stable 4 mm right lower lobe lung nodule.  No further scans recommended as it has been staying stable since July 2018.

## 2019-02-10 DIAGNOSIS — M2041 Other hammer toe(s) (acquired), right foot: Secondary | ICD-10-CM | POA: Diagnosis not present

## 2019-02-10 DIAGNOSIS — M79671 Pain in right foot: Secondary | ICD-10-CM | POA: Diagnosis not present

## 2019-02-10 DIAGNOSIS — M79674 Pain in right toe(s): Secondary | ICD-10-CM | POA: Diagnosis not present

## 2019-02-10 DIAGNOSIS — M2042 Other hammer toe(s) (acquired), left foot: Secondary | ICD-10-CM | POA: Diagnosis not present

## 2019-02-10 DIAGNOSIS — M79675 Pain in left toe(s): Secondary | ICD-10-CM | POA: Diagnosis not present

## 2019-05-18 DIAGNOSIS — G3184 Mild cognitive impairment, so stated: Secondary | ICD-10-CM | POA: Diagnosis not present

## 2019-11-22 ENCOUNTER — Encounter (INDEPENDENT_AMBULATORY_CARE_PROVIDER_SITE_OTHER): Payer: Self-pay

## 2019-12-20 DIAGNOSIS — X32XXXD Exposure to sunlight, subsequent encounter: Secondary | ICD-10-CM | POA: Diagnosis not present

## 2019-12-20 DIAGNOSIS — L570 Actinic keratosis: Secondary | ICD-10-CM | POA: Diagnosis not present

## 2019-12-27 DIAGNOSIS — L03031 Cellulitis of right toe: Secondary | ICD-10-CM | POA: Diagnosis not present

## 2019-12-27 DIAGNOSIS — M79674 Pain in right toe(s): Secondary | ICD-10-CM | POA: Diagnosis not present

## 2020-01-16 DIAGNOSIS — H43811 Vitreous degeneration, right eye: Secondary | ICD-10-CM | POA: Diagnosis not present

## 2020-02-03 ENCOUNTER — Other Ambulatory Visit (HOSPITAL_COMMUNITY): Payer: Self-pay

## 2020-02-03 DIAGNOSIS — C189 Malignant neoplasm of colon, unspecified: Secondary | ICD-10-CM

## 2020-02-03 DIAGNOSIS — E041 Nontoxic single thyroid nodule: Secondary | ICD-10-CM

## 2020-02-07 ENCOUNTER — Other Ambulatory Visit: Payer: Self-pay

## 2020-02-07 ENCOUNTER — Inpatient Hospital Stay (HOSPITAL_COMMUNITY): Payer: Medicare Other | Attending: Hematology

## 2020-02-07 DIAGNOSIS — C180 Malignant neoplasm of cecum: Secondary | ICD-10-CM | POA: Diagnosis not present

## 2020-02-07 DIAGNOSIS — Z87891 Personal history of nicotine dependence: Secondary | ICD-10-CM | POA: Insufficient documentation

## 2020-02-07 DIAGNOSIS — R911 Solitary pulmonary nodule: Secondary | ICD-10-CM | POA: Diagnosis not present

## 2020-02-07 DIAGNOSIS — K219 Gastro-esophageal reflux disease without esophagitis: Secondary | ICD-10-CM | POA: Diagnosis not present

## 2020-02-07 DIAGNOSIS — Z79899 Other long term (current) drug therapy: Secondary | ICD-10-CM | POA: Diagnosis not present

## 2020-02-07 DIAGNOSIS — C61 Malignant neoplasm of prostate: Secondary | ICD-10-CM | POA: Insufficient documentation

## 2020-02-07 DIAGNOSIS — E041 Nontoxic single thyroid nodule: Secondary | ICD-10-CM | POA: Insufficient documentation

## 2020-02-07 DIAGNOSIS — C189 Malignant neoplasm of colon, unspecified: Secondary | ICD-10-CM

## 2020-02-07 LAB — CBC WITH DIFFERENTIAL/PLATELET
Abs Immature Granulocytes: 0.02 10*3/uL (ref 0.00–0.07)
Basophils Absolute: 0.1 10*3/uL (ref 0.0–0.1)
Basophils Relative: 2 %
Eosinophils Absolute: 0.4 10*3/uL (ref 0.0–0.5)
Eosinophils Relative: 6 %
HCT: 42.4 % (ref 39.0–52.0)
Hemoglobin: 14.1 g/dL (ref 13.0–17.0)
Immature Granulocytes: 0 %
Lymphocytes Relative: 26 %
Lymphs Abs: 1.6 10*3/uL (ref 0.7–4.0)
MCH: 30.3 pg (ref 26.0–34.0)
MCHC: 33.3 g/dL (ref 30.0–36.0)
MCV: 91 fL (ref 80.0–100.0)
Monocytes Absolute: 0.5 10*3/uL (ref 0.1–1.0)
Monocytes Relative: 8 %
Neutro Abs: 3.6 10*3/uL (ref 1.7–7.7)
Neutrophils Relative %: 58 %
Platelets: 259 10*3/uL (ref 150–400)
RBC: 4.66 MIL/uL (ref 4.22–5.81)
RDW: 13.2 % (ref 11.5–15.5)
WBC: 6.2 10*3/uL (ref 4.0–10.5)
nRBC: 0 % (ref 0.0–0.2)

## 2020-02-07 LAB — COMPREHENSIVE METABOLIC PANEL
ALT: 23 U/L (ref 0–44)
AST: 30 U/L (ref 15–41)
Albumin: 3.8 g/dL (ref 3.5–5.0)
Alkaline Phosphatase: 53 U/L (ref 38–126)
Anion gap: 9 (ref 5–15)
BUN: 12 mg/dL (ref 8–23)
CO2: 26 mmol/L (ref 22–32)
Calcium: 8.8 mg/dL — ABNORMAL LOW (ref 8.9–10.3)
Chloride: 104 mmol/L (ref 98–111)
Creatinine, Ser: 0.9 mg/dL (ref 0.61–1.24)
GFR calc Af Amer: >60 mL/min (ref >60)
GFR calc non Af Amer: >60 mL/min (ref >60)
Glucose, Bld: 116 mg/dL — ABNORMAL HIGH (ref 70–99)
Potassium: 3.9 mmol/L (ref 3.5–5.1)
Sodium: 139 mmol/L (ref 135–145)
Total Bilirubin: 1.3 mg/dL — ABNORMAL HIGH (ref 0.3–1.2)
Total Protein: 6.6 g/dL (ref 6.5–8.1)

## 2020-02-07 LAB — PSA: Prostatic Specific Antigen: <0.01 ng/mL (ref 0.00–4.00)

## 2020-02-08 LAB — CEA: CEA: 3.6 ng/mL (ref 0.0–4.7)

## 2020-02-14 ENCOUNTER — Inpatient Hospital Stay (HOSPITAL_BASED_OUTPATIENT_CLINIC_OR_DEPARTMENT_OTHER): Payer: Medicare Other | Admitting: Hematology

## 2020-02-14 ENCOUNTER — Other Ambulatory Visit: Payer: Self-pay

## 2020-02-14 VITALS — BP 144/69 | HR 74 | Temp 97.1°F | Resp 18 | Wt 244.8 lb

## 2020-02-14 DIAGNOSIS — C61 Malignant neoplasm of prostate: Secondary | ICD-10-CM | POA: Diagnosis not present

## 2020-02-14 DIAGNOSIS — R911 Solitary pulmonary nodule: Secondary | ICD-10-CM | POA: Diagnosis not present

## 2020-02-14 DIAGNOSIS — E041 Nontoxic single thyroid nodule: Secondary | ICD-10-CM | POA: Diagnosis not present

## 2020-02-14 DIAGNOSIS — C180 Malignant neoplasm of cecum: Secondary | ICD-10-CM | POA: Diagnosis not present

## 2020-02-14 DIAGNOSIS — C189 Malignant neoplasm of colon, unspecified: Secondary | ICD-10-CM | POA: Diagnosis not present

## 2020-02-14 DIAGNOSIS — Z87891 Personal history of nicotine dependence: Secondary | ICD-10-CM | POA: Diagnosis not present

## 2020-02-14 DIAGNOSIS — K219 Gastro-esophageal reflux disease without esophagitis: Secondary | ICD-10-CM | POA: Diagnosis not present

## 2020-02-14 NOTE — Progress Notes (Signed)
[facility] [address], [place] [address]   CLINIC:  Medical Oncology/Hematology  PCP:  Celene Squibb, MD [address] [place] [address] [phone]   REASON FOR VISIT:  Follow-up for cecal and prostate cancer  PRIOR THERAPY:  1. Right hemicolectomy in 07/2011. 2. Radical prostatectomy on 01/19/2015.  NGS Results: Not done  CURRENT THERAPY: Observation  BRIEF ONCOLOGIC HISTORY:  Oncology History   No history exists.    CANCER STAGING: Cancer Staging No matching staging information was found for the patient.  INTERVAL HISTORY:  Mr. William Burns, a 74 y.o. male, returns for routine follow-up of his cecal and prostate cancer. Osha was last seen on 02/01/2019.  Today he is accompanied by his wife. He reports feeling well and denies having hematochezia or changes in BM's. He denies having recurrently infections, F/C, night sweats, or unexplained weight loss in the past 6 months. He denies having CP or new coughs. His appetite is good.  His last colonoscopy was in 2017.   REVIEW OF SYSTEMS:  Review of Systems  Constitutional: Negative for appetite change, chills, diaphoresis, fatigue, fever and unexpected weight change.  Respiratory: Positive for cough (d/t allergies).   Cardiovascular: Negative for chest pain.  Gastrointestinal: Negative for blood in stool, constipation and diarrhea.  Psychiatric/Behavioral: Positive for sleep disturbance (d/t leg cramps occasionally).  All other systems reviewed and are negative.   PAST MEDICAL/SURGICAL HISTORY:  Past Medical History:  Diagnosis Date  . BPH (benign prostatic hyperplasia)   . Cancer ([facility])    colon  . Cataract   . Colonic mass 07/03/2011  . Difficult intubation    extra long tube for intub per wife; states "his neck is longer than regular people" and slow to wake up   . Erosive gastritis 07/03/2011  . GERD (gastroesophageal reflux disease)   . History of kidney stones   .  Kidney stones 2011  . Renal calculus   . Tooth infection 07/03/2011   Past Surgical History:  Procedure Laterality Date  . CATARACT EXTRACTION W/PHACO Left 08/23/2012   Procedure: CATARACT EXTRACTION PHACO AND INTRAOCULAR LENS PLACEMENT (IOC);  Surgeon: Tonny Branch, MD;  Location: [facility];  Service: Ophthalmology;  Laterality: Left;  CDE 16.64  . CATARACT EXTRACTION W/PHACO Right 06/09/2013   Procedure: CATARACT EXTRACTION PHACO AND INTRAOCULAR LENS PLACEMENT (IOC) CDE=10.86;  Surgeon: Tonny Branch, MD;  Location: [facility];  Service: Ophthalmology;  Laterality: Right;  . COLON SURGERY    . COLONOSCOPY  07/03/2011   Procedure: COLONOSCOPY;  Surgeon: Rogene Houston, MD;  Location[facility];  Service: Endoscopy;  Laterality: N/A;  . COLONOSCOPY N/A 09/16/2012   Procedure: COLONOSCOPY;  Surgeon: Rogene Houston, MD;  [facility];  Service: Endoscopy;  Laterality: N/A;  930  . COLONOSCOPY N/A 12/05/2015   Procedure: COLONOSCOPY;  Surgeon: Rogene Houston, MD;  Location: [facility];  Service: Endoscopy;  Laterality: N/A;  1200  . CYSTOSCOPY  07/09/2011   Procedure: CYSTOSCOPY FLEXIBLE;  Surgeon: Marissa Nestle, MD;  Location: [facility];  Service: Urology;  Laterality: N/A;  . CYSTOSCOPY WITH LITHOLAPAXY  07/11/2011   Procedure: CYSTOSCOPY WITH LITHOLAPAXY;  Surgeon: Marissa Nestle, MD;  Location: [facility];  Service: Urology;  Laterality: N/A;  . ESOPHAGOGASTRODUODENOSCOPY  07/03/2011   Procedure: ESOPHAGOGASTRODUODENOSCOPY (EGD);  Surgeon: Rogene Houston, MD;  Location: [facility];  Service: Endoscopy;  Laterality: N/A;  . HERNIA REPAIR  2008  umbilical   . INGUINAL HERNIA REPAIR Right 05/04/2017   Procedure: HERNIA REPAIR INGUINAL ADULT WITH MESH;  Surgeon: Aviva Signs, MD;  Location: [facility];  Service: General;  Laterality: Right;  . PARTIAL COLECTOMY  07/04/2011   Procedure: PARTIAL COLECTOMY;  Surgeon: Jamesetta So, MD;  Location: [facility];  Service: General;  Laterality: N/A;  .  Prostage cancer    . PROSTATE BIOPSY    . ROBOT ASSISTED LAPAROSCOPIC RADICAL PROSTATECTOMY Bilateral 01/19/2015   Procedure: ROBOTIC ASSISTED LAPAROSCOPIC RADICAL PROSTATECTOMY AND BILATERAL PELVIC LYMPH NODE DISSECTION, LAPARASCOPIC  LYSIS OF ADHESIONS;  Surgeon: Ardis Hughs, MD;  Location: [facility];  Service: Urology;  Laterality: Bilateral;  . TRANSURETHRAL RESECTION OF PROSTATE  07/11/2011   Procedure: TRANSURETHRAL RESECTION OF THE PROSTATE (TURP);  Surgeon: Marissa Nestle, MD;  Location: [facility];  Service: Urology;  Laterality: N/A;    SOCIAL HISTORY:  Social History   Socioeconomic History  . Marital status: Married    Spouse name: Not on file  . Number of children: Not on file  . Years of education: Not on file  . Highest education level: Not on file  Occupational History  . Occupation: farmer    Comment: tobacco; cattle; poultry  Tobacco Use  . Smoking status: Former Smoker    Packs/day: 2.00    Years: 25.00    Pack years: 50.00    Types: Cigarettes    Quit date: 06/02/1989    Years since quitting: 30.7  . Smokeless tobacco: Never Used  Substance and Sexual Activity  . Alcohol use: No  . Drug use: No  . Sexual activity: Not Currently  Other Topics Concern  . Not on file  Social History Narrative  . Not on file   Social Determinants of Health   Financial Resource Strain:   . Difficulty of Paying Living Expenses: Not on file  Food Insecurity:   . Worried About Charity fundraiser in the Last Year: Not on file  . Ran Out of Food in the Last Year: Not on file  Transportation Needs:   . Lack of Transportation (Medical): Not on file  . Lack of Transportation (Non-Medical): Not on file  Physical Activity:   . Days of Exercise per Week: Not on file  . Minutes of Exercise per Session: Not on file  Stress:   . Feeling of Stress : Not on file  Social Connections:   . Frequency of Communication with Friends and Family: Not on file  . Frequency of Social  Gatherings with Friends and Family: Not on file  . Attends Religious Services: Not on file  . Active Member of Clubs or Organizations: Not on file  . Attends Archivist Meetings: Not on file  . Marital Status: Not on file  Intimate Partner Violence:   . Fear of Current or Ex-Partner: Not on file  . Emotionally Abused: Not on file  . Physically Abused: Not on file  . Sexually Abused: Not on file    FAMILY HISTORY:  Family History  Problem Relation Age of Onset  . Alzheimer's disease Mother   . Alzheimer's disease Father   . Aneurysm Brother        brain  . Anuerysm Brother   . Healthy Son   . Healthy Son   . Colon cancer Neg Hx     CURRENT MEDICATIONS:  Current Outpatient Medications  Medication Sig Dispense Refill  . Cyanocobalamin (VITAMIN B-12 PO) Take 1 tablet by mouth  daily.    . memantine (NAMENDA) 5 MG tablet     . Multiple Vitamins-Minerals (MULTIVITAMIN MEN PO) Take 1 tablet by mouth daily.     . Omega-3 Fatty Acids (FISH OIL PO) Take 1 capsule by mouth daily.    . pantoprazole (PROTONIX) 40 MG tablet TAKE 1 TABLET BY MOUTH ONCE DAILY. 90 tablet 3  . Probiotic CAPS Take 1 capsule by mouth daily. PB8 Supplement     No current facility-administered medications for this visit.    ALLERGIES:  No Known Allergies  PHYSICAL EXAM:  Performance status (ECOG): 1 - Symptomatic but completely ambulatory  Vitals:   02/14/20 1129  BP: (!) 144/69  Pulse: 74  Resp: 18  Temp: (!) 97.1 F (36.2 C)  SpO2: 97%   Wt Readings from Last 3 Encounters:  02/14/20 244 lb 12.8 oz (111 kg)  02/01/19 248 lb (112.5 kg)  01/13/19 248 lb 6 oz (112.7 kg)   Physical Exam Vitals reviewed.  Constitutional:      Appearance: Normal appearance. He is obese.  Cardiovascular:     Rate and Rhythm: Normal rate and regular rhythm.     Pulses: Normal pulses.     Heart sounds: Normal heart sounds.  Pulmonary:     Effort: Pulmonary effort is normal.     Breath sounds: Normal  breath sounds.  Abdominal:     Palpations: Abdomen is soft. There is no hepatomegaly, splenomegaly or mass.     Tenderness: There is no abdominal tenderness.     Hernia: No hernia is present.  Lymphadenopathy:     Upper Body:     Right upper body: No axillary or pectoral adenopathy.     Left upper body: No axillary or pectoral adenopathy.     Lower Body: No right inguinal adenopathy. No left inguinal adenopathy.  Neurological:     General: No focal deficit present.     Mental Status: He is alert and oriented to person, place, and time.  Psychiatric:        Mood and Affect: Mood normal.        Behavior: Behavior normal.      LABORATORY DATA:  I have reviewed the labs as listed.  CBC Latest Ref Rng & Units 02/07/2020 01/11/2019 04/30/2017  WBC 4.0 - 10.5 K/uL 6.2 7.1 10.0  Hemoglobin 13.0 - 17.0 g/dL 14.1 14.1 14.5  Hematocrit 39 - 52 % 42.4 44.0 44.7  Platelets 150 - 400 K/uL 259 268 267   CMP Latest Ref Rng & Units 02/07/2020 01/11/2019 04/30/2017  Glucose 70 - 99 mg/dL 116(H) 94 132(H)  BUN 8 - 23 mg/dL 12 11 17   Creatinine 0.61 - 1.24 mg/dL 0.90 0.91 0.92  Sodium 135 - 145 mmol/L 139 140 139  Potassium 3.5 - 5.1 mmol/L 3.9 4.1 3.5  Chloride 98 - 111 mmol/L 104 104 105  CO2 22 - 32 mmol/L 26 27 26   Calcium 8.9 - 10.3 mg/dL 8.8(L) 9.0 8.9  Total Protein 6.5 - 8.1 g/dL 6.6 7.2 -  Total Bilirubin 0.3 - 1.2 mg/dL 1.3(H) 1.0 -  Alkaline Phos 38 - 126 U/L 53 59 -  AST 15 - 41 U/L 30 32 -  ALT 0 - 44 U/L 23 24 -   Lab Results  Component Value Date   CEA1 3.6 02/07/2020   CEA1 4.3 01/11/2019   CEA1 3.7 12/16/2016   Lab Results  Component Value Date   PSA <0.01 02/07/2020   PSA 5.35 (H) 10/28/2011  DIAGNOSTIC IMAGING:  I have independently reviewed the scans and discussed with the patient. No results found.   ASSESSMENT:  1.  Cecal cancer: - Right hemicolectomy in February 2013 for a T3N0 disease.  No adjuvant chemotherapy was recommended. -Last CT scan of [facility] on  12/17/2016 showing stable pelvic nodes, which were negative on PET scan. -Last colonoscopy was on 12/05/2015 which showed normal ileum, 5 mm polyp in the transverse colon, small polyps in the rectosigmoid colon, diverticulosis of the sigmoid colon and external hemorrhoids.  2.  Prostate cancer: -Underwent radical prostatectomy on 01/19/2015 by Dr. Louis Meckel with [facility]. -Reportedly PSA has been staying undetectable since then.  3.  Left thyroid nodule: - Recent CT scan showed thyroid nodules. - Ultrasound of the thyroid on 01/11/2019 showed 1.8 cm mid left nodule. - FNA biopsy on 01/20/2019 consistent with benign follicular nodule.  4.  Lung nodule: - CT chest dated 12/21/2017 showed stable 4 mm right lower lobe lung nodule.  No further scans recommended as it has been staying stable since July 2018.   PLAN:  1.  Cecal cancer: -Denies any bleeding per rectum or change in bowel habits. -Physical exam was within normal limits.  LFTs are normal.  CEA was 3.6. -No indication for scans unless clinical condition dictates. -RTC in 1 year for follow-up.  2.  Prostate cancer: -PSA is undetectable.  Continue follow-up with urology.  Repeat PSA in 1 year.    Orders placed this encounter:  No orders of the defined types were placed in this encounter.    Derek Jack, MD [place] [phone]   I, Milinda Antis, am acting as a scribe for Dr. Sanda Linger.  I, Derek Jack MD, have reviewed the above documentation for accuracy and completeness, and I agree with the above.

## 2020-02-14 NOTE — Patient Instructions (Signed)
[facility] Discharge Instructions  You were seen today by Dr. Katragadda. He went over your recent results. Dr. Katragadda will see you back in 1 year for labs and follow up.   Thank you for choosing [facility] at [facility] to provide your oncology and hematology care.  To afford each patient quality time with our provider, please arrive at least 15 minutes before your scheduled appointment time.   If you have a lab appointment with the Cancer Center please come in thru the Main Entrance and check in at the main information desk  You need to re-schedule your appointment should you arrive 10 or more minutes late.  We strive to give you quality time with our providers, and arriving late affects you and other patients whose appointments are after yours.  Also, if you no show three or more times for appointments you may be dismissed from the clinic at the providers discretion.     Again, thank you for choosing [facility].  Our hope is that these requests will decrease the amount of time that you wait before being seen by our physicians.       _____________________________________________________________  Should you have questions after your visit to  Cancer Center, please contact our office at [phone] between the hours of 8:00 a.m. and 4:30 p.m.  Voicemails left after 4:00 p.m. will not be returned until the following business day.  For prescription refill requests, have your pharmacy contact our office and allow 72 hours.    Cancer Center Support Programs:   > Cancer Support Group  2nd Tuesday of the month 1pm-2pm, Journey Room    

## 2020-06-19 DIAGNOSIS — X32XXXD Exposure to sunlight, subsequent encounter: Secondary | ICD-10-CM | POA: Diagnosis not present

## 2020-06-19 DIAGNOSIS — L308 Other specified dermatitis: Secondary | ICD-10-CM | POA: Diagnosis not present

## 2020-06-19 DIAGNOSIS — D225 Melanocytic nevi of trunk: Secondary | ICD-10-CM | POA: Diagnosis not present

## 2020-06-19 DIAGNOSIS — L570 Actinic keratosis: Secondary | ICD-10-CM | POA: Diagnosis not present

## 2020-06-25 ENCOUNTER — Emergency Department (HOSPITAL_COMMUNITY)
Admission: EM | Admit: 2020-06-25 | Discharge: 2020-06-25 | Disposition: A | Discharge status: Home / Self Care | Payer: Medicare Other | Attending: Emergency Medicine | Admitting: Emergency Medicine

## 2020-06-25 ENCOUNTER — Other Ambulatory Visit: Payer: Self-pay

## 2020-06-25 ENCOUNTER — Encounter (HOSPITAL_COMMUNITY): Payer: Self-pay | Admitting: Emergency Medicine

## 2020-06-25 ENCOUNTER — Emergency Department (HOSPITAL_COMMUNITY): Payer: Medicare Other

## 2020-06-25 DIAGNOSIS — R404 Transient alteration of awareness: Secondary | ICD-10-CM | POA: Diagnosis not present

## 2020-06-25 DIAGNOSIS — Z8546 Personal history of malignant neoplasm of prostate: Secondary | ICD-10-CM | POA: Diagnosis not present

## 2020-06-25 DIAGNOSIS — R569 Unspecified convulsions: Secondary | ICD-10-CM

## 2020-06-25 DIAGNOSIS — Z87891 Personal history of nicotine dependence: Secondary | ICD-10-CM | POA: Diagnosis not present

## 2020-06-25 DIAGNOSIS — Z8673 Personal history of transient ischemic attack (TIA), and cerebral infarction without residual deficits: Secondary | ICD-10-CM | POA: Insufficient documentation

## 2020-06-25 DIAGNOSIS — R52 Pain, unspecified: Secondary | ICD-10-CM | POA: Diagnosis not present

## 2020-06-25 DIAGNOSIS — R0902 Hypoxemia: Secondary | ICD-10-CM | POA: Diagnosis not present

## 2020-06-25 DIAGNOSIS — R9431 Abnormal electrocardiogram [ECG] [EKG]: Secondary | ICD-10-CM | POA: Diagnosis not present

## 2020-06-25 DIAGNOSIS — G40909 Epilepsy, unspecified, not intractable, without status epilepticus: Secondary | ICD-10-CM | POA: Diagnosis not present

## 2020-06-25 LAB — ETHANOL: Alcohol, Ethyl (B): <10 mg/dL (ref <10)

## 2020-06-25 LAB — CBC WITH DIFFERENTIAL/PLATELET
Abs Immature Granulocytes: 0.02 10*3/uL (ref 0.00–0.07)
Basophils Absolute: 0.1 10*3/uL (ref 0.0–0.1)
Basophils Relative: 1 %
Eosinophils Absolute: 0.1 10*3/uL (ref 0.0–0.5)
Eosinophils Relative: 1 %
HCT: 42.9 % (ref 39.0–52.0)
Hemoglobin: 14.3 g/dL (ref 13.0–17.0)
Immature Granulocytes: 0 %
Lymphocytes Relative: 9 %
Lymphs Abs: 0.8 10*3/uL (ref 0.7–4.0)
MCH: 30.4 pg (ref 26.0–34.0)
MCHC: 33.3 g/dL (ref 30.0–36.0)
MCV: 91.1 fL (ref 80.0–100.0)
Monocytes Absolute: 0.4 10*3/uL (ref 0.1–1.0)
Monocytes Relative: 5 %
Neutro Abs: 7.5 10*3/uL (ref 1.7–7.7)
Neutrophils Relative %: 84 %
Platelets: 255 10*3/uL (ref 150–400)
RBC: 4.71 MIL/uL (ref 4.22–5.81)
RDW: 12.9 % (ref 11.5–15.5)
WBC: 8.8 10*3/uL (ref 4.0–10.5)
nRBC: 0 % (ref 0.0–0.2)

## 2020-06-25 LAB — URINALYSIS, ROUTINE W REFLEX MICROSCOPIC
Bacteria, UA: NONE SEEN
Bilirubin Urine: NEGATIVE
Glucose, UA: NEGATIVE mg/dL
Hgb urine dipstick: NEGATIVE
Ketones, ur: NEGATIVE mg/dL
Leukocytes,Ua: NEGATIVE
Nitrite: NEGATIVE
Protein, ur: 30 mg/dL — AB
Specific Gravity, Urine: 1.016 (ref 1.005–1.030)
pH: 6 (ref 5.0–8.0)

## 2020-06-25 LAB — COMPREHENSIVE METABOLIC PANEL
ALT: 26 U/L (ref 0–44)
AST: 36 U/L (ref 15–41)
Albumin: 3.8 g/dL (ref 3.5–5.0)
Alkaline Phosphatase: 61 U/L (ref 38–126)
Anion gap: 9 (ref 5–15)
BUN: 16 mg/dL (ref 8–23)
CO2: 25 mmol/L (ref 22–32)
Calcium: 8.8 mg/dL — ABNORMAL LOW (ref 8.9–10.3)
Chloride: 102 mmol/L (ref 98–111)
Creatinine, Ser: 1 mg/dL (ref 0.61–1.24)
GFR, Estimated: >60 mL/min (ref >60)
Glucose, Bld: 134 mg/dL — ABNORMAL HIGH (ref 70–99)
Potassium: 3.9 mmol/L (ref 3.5–5.1)
Sodium: 136 mmol/L (ref 135–145)
Total Bilirubin: 0.6 mg/dL (ref 0.3–1.2)
Total Protein: 6.7 g/dL (ref 6.5–8.1)

## 2020-06-25 LAB — RAPID URINE DRUG SCREEN, HOSP PERFORMED
Amphetamines: NOT DETECTED
Barbiturates: NOT DETECTED
Benzodiazepines: NOT DETECTED
Cocaine: NOT DETECTED
Opiates: NOT DETECTED
Tetrahydrocannabinol: NOT DETECTED

## 2020-06-25 NOTE — ED Triage Notes (Signed)
EMS called for witnessed seizure. Per EMS, when they arrived; pt was post-ictal. No hx of seizures.

## 2020-06-25 NOTE — Discharge Instructions (Addendum)
Your evaluation today did not show an obvious cause for the seizure.  Approximately half of people who have a seizure will never have another one.  You still need to have a brainwave study.  Please follow-up with the neurologist to set that up.  You can discuss the pros and cons of starting on seizure medication with the neurologist when you see him.  Please be aware that the law in [place] states that you may not drive a car if you have had a seizure in the previous 6 months.

## 2020-06-25 NOTE — ED Provider Notes (Signed)
[facility] EMERGENCY DEPARTMENT Provider Note   CSN: [id number] Arrival date & time: 06/25/20  0143   History Chief Complaint  Patient presents with  . Seizures    [facility] is a 75 y.o. male.  The history is provided by the patient and the EMS personnel.  Seizures He has history of TIA, colon cancer and is brought in by ambulance after having had a seizure at home.  EMS noted post ictal state.  There was no bit lip or tongue and no incontinence.  He has no history of seizures.  Denies any recent medication changes.  He denies alcohol and drug use.  Past Medical History:  Diagnosis Date  . BPH (benign prostatic hyperplasia)   . Cancer ([place])    colon  . Cataract   . Colonic mass 07/03/2011  . Difficult intubation    extra long tube for intub per wife; states "his neck is longer than regular people" and slow to wake up   . Erosive gastritis 07/03/2011  . GERD (gastroesophageal reflux disease)   . History of kidney stones   . Kidney stones 2011  . Renal calculus   . Tooth infection 07/03/2011    Patient Active Problem List   Diagnosis Date Noted  . Left thyroid nodule 01/13/2019  . Non-recurrent unilateral inguinal hernia without obstruction or gangrene   . GERD (gastroesophageal reflux disease) 01/26/2015  . TIA (transient ischemic attack)   . 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 Prostate cancer ([place]) 08/29/2014  . Kidney stone on left side 08/02/2013  . Diarrhea 07/05/2013  . Short-segment Barrett's esophagus 02/09/2012  . Calculus, bladder 07/04/2011  . Tooth infection 07/03/2011  . Adenocarcinoma of colon ([place]) 07/03/2011  . Erosive gastritis 07/03/2011  . Microcytic hypochromic anemia 07/02/2011  . Hx of colonic polyp 07/02/2011  . Hyperglycemia 07/02/2011  . BPH (benign prostatic hyperplasia) 07/02/2011    Past Surgical History:  Procedure Laterality Date  . CATARACT EXTRACTION W/PHACO Left 08/23/2012   Procedure: CATARACT EXTRACTION PHACO AND  INTRAOCULAR LENS PLACEMENT (IOC);  Surgeon: Tonny Branch, MD;  Location: [facility];  Service: Ophthalmology;  Laterality: Left;  CDE 16.64  . CATARACT EXTRACTION W/PHACO Right 06/09/2013   Procedure: CATARACT EXTRACTION PHACO AND INTRAOCULAR LENS PLACEMENT (IOC) CDE=10.86;  Surgeon: Tonny Branch, MD;  Location: [facility];  Service: Ophthalmology;  Laterality: Right;  . COLON SURGERY    . COLONOSCOPY  07/03/2011   Procedure: COLONOSCOPY;  Surgeon: Rogene Houston, MD;  Location: [facility];  Service: Endoscopy;  Laterality: N/A;  . COLONOSCOPY N/A 09/16/2012   Procedure: COLONOSCOPY;  Surgeon: Rogene Houston, MD;  [facility];  Service: Endoscopy;  Laterality: N/A;  930  . COLONOSCOPY N/A 12/05/2015   Procedure: COLONOSCOPY;  Surgeon: Rogene Houston, MD;  Location: [facility];  Service: Endoscopy;  Laterality: N/A;  1200  . CYSTOSCOPY  07/09/2011   Procedure: CYSTOSCOPY FLEXIBLE;  Surgeon: Marissa Nestle, MD;  Location: [facility];  Service: Urology;  Laterality: N/A;  . CYSTOSCOPY WITH LITHOLAPAXY  07/11/2011   Procedure: CYSTOSCOPY WITH LITHOLAPAXY;  Surgeon: Marissa Nestle, MD;  Location: [facility];  Service: Urology;  Laterality: N/A;  . ESOPHAGOGASTRODUODENOSCOPY  07/03/2011   Procedure: ESOPHAGOGASTRODUODENOSCOPY (EGD);  Surgeon: Rogene Houston, MD;  [facility];  Service: Endoscopy;  Laterality: N/A;  . HERNIA REPAIR  4098    umbilical   . INGUINAL HERNIA REPAIR Right 05/04/2017   Procedure: HERNIA REPAIR INGUINAL ADULT WITH  MESH;  Surgeon: Aviva Signs, MD;  Location: [facility];  Service: General;  Laterality: Right;  . PARTIAL COLECTOMY  07/04/2011   Procedure: PARTIAL COLECTOMY;  Surgeon: Jamesetta So, MD;  Location: [facility];  Service: General;  Laterality: N/A;  . Prostage cancer    . PROSTATE BIOPSY    . ROBOT ASSISTED LAPAROSCOPIC RADICAL PROSTATECTOMY Bilateral 01/19/2015   Procedure: ROBOTIC ASSISTED LAPAROSCOPIC RADICAL PROSTATECTOMY AND BILATERAL PELVIC LYMPH NODE  DISSECTION, LAPARASCOPIC  LYSIS OF ADHESIONS;  Surgeon: Ardis Hughs, MD;  Location: [facility];  Service: Urology;  Laterality: Bilateral;  . TRANSURETHRAL RESECTION OF PROSTATE  07/11/2011   Procedure: TRANSURETHRAL RESECTION OF THE PROSTATE (TURP);  Surgeon: Marissa Nestle, MD;  Location: [facility];  Service: Urology;  Laterality: N/A;       Family History  Problem Relation Age of Onset  . Alzheimer's disease Mother   . Alzheimer's disease Father   . Aneurysm Brother        brain  . Anuerysm Brother   . Healthy Son   . Healthy Son   . Colon cancer Neg Hx     Social History   Tobacco Use  . Smoking status: Former Smoker    Packs/day: 2.00    Years: 25.00    Pack years: 50.00    Types: Cigarettes    Quit date: 06/02/1989    Years since quitting: 31.0  . Smokeless tobacco: Never Used  Substance Use Topics  . Alcohol use: No  . Drug use: No    Home Medications Prior to Admission medications   Medication Sig Start Date End Date Taking? Authorizing Provider  Cyanocobalamin (VITAMIN B-12 PO) Take 1 tablet by mouth daily.    [provider]  memantine [facility]) 5 MG tablet  01/07/19   [provider]  Multiple Vitamins-Minerals (MULTIVITAMIN MEN PO) Take 1 tablet by mouth daily.     [provider]  Omega-3 Fatty Acids (FISH OIL PO) Take 1 capsule by mouth daily.    [provider]  pantoprazole (PROTONIX) 40 MG tablet TAKE 1 TABLET BY MOUTH ONCE DAILY. 11/15/18   Setzer, Rona Ravens, NP  Probiotic CAPS Take 1 capsule by mouth daily. PB8 Supplement    [provider]    Allergies    Patient has no known allergies.  Review of Systems   Review of Systems  Neurological: Positive for seizures.  All other systems reviewed and are negative.   Physical Exam Updated Vital Signs BP (!) 154/78   Pulse 100   Resp 18   Ht 6\' 3"  (1.905 m)   Wt 112 kg   SpO2 98%   BMI 30.86 kg/m   Physical Exam Vitals and nursing note reviewed.    75 year old male, resting comfortably and in no acute distress. Vital signs are significant for elevated blood pressure. Oxygen saturation is 98%, which is normal. Head is normocephalic and atraumatic. PERRLA, EOMI. Oropharynx is clear. Neck is nontender and supple without adenopathy or JVD. Back is nontender and there is no CVA tenderness. Lungs are clear without rales, wheezes, or rhonchi. Chest is nontender. Heart has regular rate and rhythm without murmur. Abdomen is soft, flat, nontender without masses or hepatosplenomegaly and peristalsis is normoactive. Extremities have no cyanosis or edema, full range of motion is present. Skin is warm and dry without rash. Neurologic: Awake and alert, oriented to person but not place or time, cranial nerves are intact, moves all extremities equally.  ED Results /  Procedures / Treatments   Labs (all labs ordered are listed, but only abnormal results are displayed) Labs Reviewed  COMPREHENSIVE METABOLIC PANEL - Abnormal; Notable for the following components:      Result Value   Glucose, Bld 134 (*)    Calcium 8.8 (*)    All other components within normal limits  URINALYSIS, ROUTINE W REFLEX MICROSCOPIC - Abnormal; Notable for the following components:   Protein, ur 30 (*)    All other components within normal limits  CBC WITH DIFFERENTIAL/PLATELET  ETHANOL  RAPID URINE DRUG SCREEN, HOSP PERFORMED  CBG MONITORING, ED    EKG EKG Interpretation  Date/Time:  Monday June 25 2020 01:51:35 EST Ventricular Rate:  99 PR Interval:    QRS Duration: 113 QT Interval:  367 QTC Calculation: 471 R Axis:   -57 Text Interpretation: Sinus rhythm Borderline prolonged PR interval Left anterior fascicular block Probable anteroseptal infarct, old Nonspecific T abnormalities, lateral leads When compared with ECG of 04/30/2017, No significant change was found Confirmed by Delora Fuel ([id number]) on 06/25/2020 1:58:16 AM   Radiology CT Head Wo  Contrast  Result Date: 06/25/2020 CLINICAL DATA:  Nontraumatic seizures. EXAM: CT HEAD WITHOUT CONTRAST TECHNIQUE: Contiguous axial images were obtained from the base of the skull through the vertex without intravenous contrast. COMPARISON:  CT head 01/25/2015.  MRI brain 01/28/2015 FINDINGS: Brain: No evidence of acute infarction, hemorrhage, hydrocephalus, extra-axial collection or mass lesion/mass effect. Mild cerebral atrophy. Low-attenuation changes in the deep white matter consistent with small vessel ischemic change. Vascular: No hyperdense vessel or unexpected calcification. Skull: Normal. Negative for fracture or focal lesion. Sinuses/Orbits: Mucosal thickening in the paranasal sinuses. No acute air-fluid levels. Mastoid air cells are clear. Other: None. IMPRESSION: 1. No acute intracranial abnormalities. 2. Chronic atrophy and small vessel ischemic changes. Electronically Signed   By: Lucienne Capers M.D.   On: 06/25/2020 02:43    Procedures Procedures  Medications Ordered in ED Medications - No data to display  ED Course  I have reviewed the triage vital signs and the nursing notes.  Pertinent labs & imaging results that were available during my care of the patient were reviewed by me and considered in my medical decision making (see chart for details).  MDM Rules/Calculators/A&P Seizure with mild continuing confusion.  Old records are reviewed, and he has no relevant past visits.  MRI of the brain and CT of the head in August 2016 were remarkable for prior strokes.  Seizure work-up is initiated including CT of head and screening labs.  CT scan shows chronic atrophy and small vessel disease, but no acute findings.  Labs are also unremarkable.  Mental status has returned to baseline.  His wife is here reviewed described a generalized seizure with postictal state.  He is discharged with referral to neurology for outpatient EEG.  No anticonvulsants prescribed at this time.  Final  Clinical Impression(s) / ED Diagnoses Final diagnoses:  Seizure [facility])    Rx / DC Orders ED Discharge Orders    None       Delora Fuel, MD 62/70/35 [phone]

## 2020-06-25 NOTE — ED Notes (Signed)
Patient is alert and oriented. No seizure like activity observed in the ED

## 2020-06-27 DIAGNOSIS — R7301 Impaired fasting glucose: Secondary | ICD-10-CM | POA: Diagnosis not present

## 2020-06-27 DIAGNOSIS — F519 Sleep disorder not due to a substance or known physiological condition, unspecified: Secondary | ICD-10-CM | POA: Diagnosis not present

## 2020-06-27 DIAGNOSIS — E785 Hyperlipidemia, unspecified: Secondary | ICD-10-CM | POA: Diagnosis not present

## 2020-06-27 DIAGNOSIS — K219 Gastro-esophageal reflux disease without esophagitis: Secondary | ICD-10-CM | POA: Diagnosis not present

## 2020-06-27 DIAGNOSIS — G40509 Epileptic seizures related to external causes, not intractable, without status epilepticus: Secondary | ICD-10-CM | POA: Diagnosis not present

## 2020-07-06 DIAGNOSIS — R7301 Impaired fasting glucose: Secondary | ICD-10-CM | POA: Diagnosis not present

## 2020-07-06 DIAGNOSIS — F519 Sleep disorder not due to a substance or known physiological condition, unspecified: Secondary | ICD-10-CM | POA: Diagnosis not present

## 2020-07-06 DIAGNOSIS — E785 Hyperlipidemia, unspecified: Secondary | ICD-10-CM | POA: Diagnosis not present

## 2020-07-06 DIAGNOSIS — S80811A Abrasion, right lower leg, initial encounter: Secondary | ICD-10-CM | POA: Diagnosis not present

## 2020-07-06 DIAGNOSIS — Z23 Encounter for immunization: Secondary | ICD-10-CM | POA: Diagnosis not present

## 2020-07-06 DIAGNOSIS — L0390 Cellulitis, unspecified: Secondary | ICD-10-CM | POA: Diagnosis not present

## 2020-07-06 DIAGNOSIS — G40509 Epileptic seizures related to external causes, not intractable, without status epilepticus: Secondary | ICD-10-CM | POA: Diagnosis not present

## 2020-07-06 DIAGNOSIS — K219 Gastro-esophageal reflux disease without esophagitis: Secondary | ICD-10-CM | POA: Diagnosis not present

## 2020-07-06 DIAGNOSIS — G3184 Mild cognitive impairment, so stated: Secondary | ICD-10-CM | POA: Diagnosis not present

## 2020-07-06 DIAGNOSIS — W57XXXA Bitten or stung by nonvenomous insect and other nonvenomous arthropods, initial encounter: Secondary | ICD-10-CM | POA: Diagnosis not present

## 2020-07-11 DIAGNOSIS — R7301 Impaired fasting glucose: Secondary | ICD-10-CM | POA: Diagnosis not present

## 2020-07-11 DIAGNOSIS — E785 Hyperlipidemia, unspecified: Secondary | ICD-10-CM | POA: Diagnosis not present

## 2020-07-11 DIAGNOSIS — F519 Sleep disorder not due to a substance or known physiological condition, unspecified: Secondary | ICD-10-CM | POA: Diagnosis not present

## 2020-07-11 DIAGNOSIS — K219 Gastro-esophageal reflux disease without esophagitis: Secondary | ICD-10-CM | POA: Diagnosis not present

## 2020-07-25 ENCOUNTER — Other Ambulatory Visit: Payer: Self-pay | Admitting: Neurology

## 2020-07-25 ENCOUNTER — Other Ambulatory Visit (HOSPITAL_COMMUNITY): Payer: Self-pay | Admitting: Neurology

## 2020-07-25 DIAGNOSIS — R413 Other amnesia: Secondary | ICD-10-CM | POA: Diagnosis not present

## 2020-07-25 DIAGNOSIS — G603 Idiopathic progressive neuropathy: Secondary | ICD-10-CM | POA: Diagnosis not present

## 2020-07-25 DIAGNOSIS — G4733 Obstructive sleep apnea (adult) (pediatric): Secondary | ICD-10-CM | POA: Diagnosis not present

## 2020-07-25 DIAGNOSIS — G40909 Epilepsy, unspecified, not intractable, without status epilepticus: Secondary | ICD-10-CM

## 2020-08-07 ENCOUNTER — Other Ambulatory Visit: Payer: Self-pay

## 2020-08-07 ENCOUNTER — Ambulatory Visit (HOSPITAL_COMMUNITY)
Admission: RE | Admit: 2020-08-07 | Discharge: 2020-08-07 | Disposition: A | Discharge status: Home / Self Care | Payer: Medicare Other | Source: Ambulatory Visit | Attending: Neurology | Admitting: Neurology

## 2020-08-07 DIAGNOSIS — H748X2 Other specified disorders of left middle ear and mastoid: Secondary | ICD-10-CM | POA: Diagnosis not present

## 2020-08-07 DIAGNOSIS — J321 Chronic frontal sinusitis: Secondary | ICD-10-CM | POA: Diagnosis not present

## 2020-08-07 DIAGNOSIS — G40909 Epilepsy, unspecified, not intractable, without status epilepticus: Secondary | ICD-10-CM | POA: Diagnosis not present

## 2020-08-07 DIAGNOSIS — J322 Chronic ethmoidal sinusitis: Secondary | ICD-10-CM | POA: Diagnosis not present

## 2020-08-07 MED ORDER — GADOBUTROL 1 MMOL/ML IV SOLN
10.0000 mL | Freq: Once | INTRAVENOUS | Status: AC | PRN
  Administered 2020-08-07: 10 mL via INTRAVENOUS

## 2020-08-15 DIAGNOSIS — G40319 Generalized idiopathic epilepsy and epileptic syndromes, intractable, without status epilepticus: Secondary | ICD-10-CM | POA: Diagnosis not present

## 2020-08-21 DIAGNOSIS — H6123 Impacted cerumen, bilateral: Secondary | ICD-10-CM | POA: Diagnosis not present

## 2020-08-21 DIAGNOSIS — H8110 Benign paroxysmal vertigo, unspecified ear: Secondary | ICD-10-CM | POA: Diagnosis not present

## 2020-09-05 DIAGNOSIS — G603 Idiopathic progressive neuropathy: Secondary | ICD-10-CM | POA: Diagnosis not present

## 2020-09-05 DIAGNOSIS — H8112 Benign paroxysmal vertigo, left ear: Secondary | ICD-10-CM | POA: Diagnosis not present

## 2020-09-05 DIAGNOSIS — G40119 Localization-related (focal) (partial) symptomatic epilepsy and epileptic syndromes with simple partial seizures, intractable, without status epilepticus: Secondary | ICD-10-CM | POA: Diagnosis not present

## 2020-09-05 DIAGNOSIS — R413 Other amnesia: Secondary | ICD-10-CM | POA: Diagnosis not present

## 2020-09-05 DIAGNOSIS — G4733 Obstructive sleep apnea (adult) (pediatric): Secondary | ICD-10-CM | POA: Diagnosis not present

## 2020-09-20 ENCOUNTER — Other Ambulatory Visit: Payer: Self-pay

## 2020-09-20 ENCOUNTER — Encounter (HOSPITAL_COMMUNITY): Payer: Self-pay | Admitting: Physical Therapy

## 2020-09-20 ENCOUNTER — Ambulatory Visit (HOSPITAL_COMMUNITY): Payer: Medicare Other | Attending: Neurology | Admitting: Physical Therapy

## 2020-09-20 DIAGNOSIS — H8112 Benign paroxysmal vertigo, left ear: Secondary | ICD-10-CM | POA: Insufficient documentation

## 2020-09-20 DIAGNOSIS — R42 Dizziness and giddiness: Secondary | ICD-10-CM | POA: Diagnosis not present

## 2020-09-20 NOTE — Therapy (Signed)
[place], [place], [address] Phone: [phone]   Fax:  [phone]  Physical Therapy Evaluation  Patient Details  Name: [facility] MRN: [id number] Date of Birth: 1945-11-11 Referring Provider (PT): Phillips Odor MD   Encounter Date: 09/20/2020   PT End of Session - 09/20/20 1314    Visit Number 1    Number of Visits 3    Date for PT Re-Evaluation 10/11/20    Authorization Type Medicare A/ BCBS 2ndary    PT Start Time 1118    PT Stop Time 1200    PT Time Calculation (min) 42 min    Activity Tolerance Patient tolerated treatment well    Behavior During Therapy [facility] for tasks assessed/performed           Past Medical History:  Diagnosis Date  . BPH (benign prostatic hyperplasia)   . Cancer ([place])    colon  . Cataract   . Colonic mass 07/03/2011  . Difficult intubation    extra long tube for intub per wife; states "his neck is longer than regular people" and slow to wake up   . Erosive gastritis 07/03/2011  . GERD (gastroesophageal reflux disease)   . History of kidney stones   . Kidney stones 2011  . Renal calculus   . Tooth infection 07/03/2011    Past Surgical History:  Procedure Laterality Date  . CATARACT EXTRACTION W/PHACO Left 08/23/2012   Procedure: CATARACT EXTRACTION PHACO AND INTRAOCULAR LENS PLACEMENT (IOC);  Surgeon: Tonny Branch, MD;  Location: [facility];  Service: Ophthalmology;  Laterality: Left;  CDE 16.64  . CATARACT EXTRACTION W/PHACO Right 06/09/2013   Procedure: CATARACT EXTRACTION PHACO AND INTRAOCULAR LENS PLACEMENT (IOC) CDE=10.86;  Surgeon: Tonny Branch, MD;  Location: [facility];  Service: Ophthalmology;  Laterality: Right;  . COLON SURGERY    . COLONOSCOPY  07/03/2011   Procedure: COLONOSCOPY;  Surgeon: Rogene Houston, MD;  [facility];  Service: Endoscopy;  Laterality: N/A;  . COLONOSCOPY N/A 09/16/2012   Procedure: COLONOSCOPY;  Surgeon: Rogene Houston, MD;  [facility];  Service: Endoscopy;  Laterality: N/A;  930  . COLONOSCOPY N/A 12/05/2015   Procedure: COLONOSCOPY;  Surgeon: Rogene Houston, MD;  [facility];  Service: Endoscopy;  Laterality: N/A;  1200  . CYSTOSCOPY  07/09/2011   Procedure: CYSTOSCOPY FLEXIBLE;  Surgeon: Marissa Nestle, MD;  Location: [facility];  Service: Urology;  Laterality: N/A;  . CYSTOSCOPY WITH LITHOLAPAXY  07/11/2011   Procedure: CYSTOSCOPY WITH LITHOLAPAXY;  Surgeon: Marissa Nestle, MD;  Location: [facility];  Service: Urology;  Laterality: N/A;  . ESOPHAGOGASTRODUODENOSCOPY  07/03/2011   Procedure: ESOPHAGOGASTRODUODENOSCOPY (EGD);  Surgeon: Rogene Houston, MD;  [facility];  Service: Endoscopy;  Laterality: N/A;  . HERNIA REPAIR  3354    umbilical   . INGUINAL HERNIA REPAIR Right 05/04/2017   Procedure: HERNIA REPAIR INGUINAL ADULT WITH MESH;  Surgeon: Aviva Signs, MD;  Location: [facility];  Service: General;  Laterality: Right;  . PARTIAL COLECTOMY  07/04/2011   Procedure: PARTIAL COLECTOMY;  Surgeon: Jamesetta So, MD;  Location: [facility];  Service: General;  Laterality: N/A;  . Prostage cancer    . PROSTATE BIOPSY    . ROBOT ASSISTED LAPAROSCOPIC RADICAL PROSTATECTOMY Bilateral 01/19/2015   Procedure: ROBOTIC ASSISTED LAPAROSCOPIC RADICAL PROSTATECTOMY AND BILATERAL PELVIC LYMPH NODE DISSECTION, LAPARASCOPIC  LYSIS OF ADHESIONS;  Surgeon: Ardis Hughs, MD;  Location: [facility];  Service: Urology;  Laterality: Bilateral;  . TRANSURETHRAL RESECTION OF PROSTATE  07/11/2011   Procedure: TRANSURETHRAL RESECTION OF THE PROSTATE (TURP);  Surgeon: Marissa Nestle, MD;  Location: [facility];  Service: Urology;  Laterality: N/A;    There were no vitals filed for this visit.    Subjective Assessment - 09/20/20 1128    Subjective Patient presents to therapy with complaint of positional vertigo. He says this began about 4 weeks ago. Reports insidious onset. Reports history of seizures. Started some new  medications. Has been diagnosed with BPPV. Gets dizzy every night when he lays down and when sitting up in bed. Only last a few seconds.              [facility] PT Assessment - 09/20/20 0001      Assessment   Medical Diagnosis BPPV    Referring Provider (PT) Phillips Odor MD    Onset Date/Surgical Date 08/27/20      Balance Screen   Has the patient fallen in the past 6 months No    Has the patient had a decrease in activity level because of a fear of falling?  No      Home Ecologist residence    Living Arrangements Spouse/significant other      Prior Function   Level of Independence Independent      Cognition   Overall Cognitive Status Within Functional Limits for tasks assessed      Observation/Other Assessments   Focus on Therapeutic Outcomes (FOTO)  61% function                  Vestibular Assessment - 09/20/20 0001      Symptom Behavior   Type of Dizziness  Spinning    Frequency of Dizziness Daily    Duration of Dizziness seconds    Symptom Nature Motion provoked    Aggravating Factors Lying supine;Turning head quickly;Supine to sit    Progression of Symptoms Better    History of similar episodes None      Oculomotor Exam   Oculomotor Alignment Normal    Ocular ROM WNL    Smooth Pursuits Intact      Positional Testing   Dix-Hallpike Dix-Hallpike Right;Dix-Hallpike Left      Dix-Hallpike Right   Dix-Hallpike Right Duration negative    Dix-Hallpike Right Symptoms No nystagmus      Dix-Hallpike Left   Dix-Hallpike Left Duration 5 seconds    Dix-Hallpike Left Symptoms Upbeat, left rotatory nystagmus              Objective measurements completed on examination: See above findings.               PT Education - 09/20/20 1325    Education Details on evaluation findings, BPPV and inner ear anatomy, on therapy POC and bed positioning    Person(s) Educated Patient    Methods Explanation    Comprehension  Verbalized understanding            PT Short Term Goals - 09/20/20 1320      PT SHORT TERM GOAL #1   Title Patient will have full resolution of BPPV related vertigo for improved functional ability, allowing for return to PLOF with ADLs and bed mobility.    Time 2    Period Weeks    Status New    Target Date 10/04/20  Plan - 09/20/20 1315    Clinical Impression Statement Patient is a 75 y.o. male who presents to physical therapy with complaint of dizziness related to BPPV. Patient demonstrates positive sign with DixHall Ladean Raya for BPPV resulting in increased vertigo symptoms which are negatively impacting patient ability to perform ADLs and functional mobility tasks. Patient will benefit from skilled physical therapy services to address these deficits to improve level of function with ADLs, functional mobility tasks, and reduce risk for falls.    Examination-Activity Limitations Bed Mobility;Bend;Transfers    Examination-Participation Restrictions Community Activity;Yard Work;Other    Stability/Clinical Decision Making Stable/Uncomplicated    Clinical Decision Making Low    Rehab Potential Good    PT Frequency 1x / week    PT Duration 3 weeks    PT Treatment/Interventions Canalith Repostioning;Vestibular;Passive range of motion;Manual techniques;ADLs/Self Care Home Management;Therapeutic activities;Therapeutic exercise;Balance training;Patient/family education;Functional mobility training;Stair training;Gait training    PT Next Visit Plan Reassess BPPV, perform Eppley maneuver if indicated. Assess remaining balance deficits, proceed with habituation if needed.    PT Home Exercise Plan Education on bed positioning and avoiding side lying position for next several nights.    Consulted and Agree with Plan of Care Patient;Family member/caregiver    Family Member Consulted wife           Patient will benefit from skilled therapeutic intervention in order to  improve the following deficits and impairments:  Dizziness,Decreased balance  Visit Diagnosis: BPPV (benign paroxysmal positional vertigo), left  Dizziness and giddiness     Problem List Patient Active Problem List   Diagnosis Date Noted  . Left thyroid nodule 01/13/2019  . Non-recurrent unilateral inguinal hernia without obstruction or gangrene   . GERD (gastroesophageal reflux disease) 01/26/2015  . TIA (transient ischemic attack)   . 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 Prostate cancer ([place]) 08/29/2014  . Kidney stone on left side 08/02/2013  . Diarrhea 07/05/2013  . Short-segment Barrett's esophagus 02/09/2012  . Calculus, bladder 07/04/2011  . Tooth infection 07/03/2011  . Adenocarcinoma of colon ([place]) 07/03/2011  . Erosive gastritis 07/03/2011  . Microcytic hypochromic anemia 07/02/2011  . Hx of colonic polyp 07/02/2011  . Hyperglycemia 07/02/2011  . BPH (benign prostatic hyperplasia) 07/02/2011   1:26 PM, 09/20/20 Josue Hector PT DPT  Physical Therapist with [facility]  [phone] [place] [address] [place], [place], [address] Phone: [phone]   Fax:  [phone]  Name: LILY KERNEN MRN: [id number] Date of Birth: [birth year]

## 2020-09-25 ENCOUNTER — Encounter (HOSPITAL_COMMUNITY): Payer: Self-pay | Admitting: Physical Therapy

## 2020-09-25 ENCOUNTER — Other Ambulatory Visit: Payer: Self-pay

## 2020-09-25 ENCOUNTER — Ambulatory Visit (HOSPITAL_COMMUNITY): Payer: Medicare Other | Admitting: Physical Therapy

## 2020-09-25 DIAGNOSIS — H8112 Benign paroxysmal vertigo, left ear: Secondary | ICD-10-CM

## 2020-09-25 DIAGNOSIS — R42 Dizziness and giddiness: Secondary | ICD-10-CM

## 2020-09-25 NOTE — Therapy (Signed)
[facility] [address] [place], [place], [address] Phone: [phone]   Fax:  [phone]  Physical Therapy Treatment  Patient Details  Name: William Burns MRN: [id number] Date of Birth: July 17, 1945 Referring Provider (PT): Phillips Odor MD  PHYSICAL THERAPY DISCHARGE SUMMARY  Visits from Start of Care: 2 Current functional level related to goals / functional outcomes: See below   Remaining deficits: See below   Education / Equipment: See assessment Plan: Patient agrees to discharge.  Patient goals were met. Patient is being discharged due to meeting the stated rehab goals.  ?????      Encounter Date: 09/25/2020   PT End of Session - 09/25/20 1654    Visit Number 2    Number of Visits 3    Date for PT Re-Evaluation 10/11/20    Authorization Type Medicare A/ BCBS 2ndary    PT Start Time 1647    PT Stop Time 1710    PT Time Calculation (min) 23 min    Activity Tolerance Patient tolerated treatment well    Behavior During Therapy WFL for tasks assessed/performed           Past Medical History:  Diagnosis Date  . BPH (benign prostatic hyperplasia)   . Cancer ([place])    colon  . Cataract   . Colonic mass 07/03/2011  . Difficult intubation    extra long tube for intub per wife; states "his neck is longer than regular people" and slow to wake up   . Erosive gastritis 07/03/2011  . GERD (gastroesophageal reflux disease)   . History of kidney stones   . Kidney stones 2011  . Renal calculus   . Tooth infection 07/03/2011    Past Surgical History:  Procedure Laterality Date  . CATARACT EXTRACTION W/PHACO Left 08/23/2012   Procedure: CATARACT EXTRACTION PHACO AND INTRAOCULAR LENS PLACEMENT (IOC);  Surgeon: Tonny Branch, MD;  Location: [facility];  Service: Ophthalmology;  Laterality: Left;  CDE 16.64  . CATARACT EXTRACTION W/PHACO Right 06/09/2013   Procedure: CATARACT EXTRACTION PHACO AND INTRAOCULAR LENS PLACEMENT (IOC)  CDE=10.86;  Surgeon: Tonny Branch, MD;  Location: [facility];  Service: Ophthalmology;  Laterality: Right;  . COLON SURGERY    . COLONOSCOPY  07/03/2011   Procedure: COLONOSCOPY;  Surgeon: Rogene Houston, MD;  [facility];  Service: Endoscopy;  Laterality: N/A;  . COLONOSCOPY N/A 09/16/2012   Procedure: COLONOSCOPY;  Surgeon: Rogene Houston, MD;  [facility];  Service: Endoscopy;  Laterality: N/A;  930  . COLONOSCOPY N/A 12/05/2015   Procedure: COLONOSCOPY;  Surgeon: Rogene Houston, MD;  [facility];  Service: Endoscopy;  Laterality: N/A;  1200  . CYSTOSCOPY  07/09/2011   Procedure: CYSTOSCOPY FLEXIBLE;  Surgeon: Marissa Nestle, MD;  Location: [facility];  Service: Urology;  Laterality: N/A;  . CYSTOSCOPY WITH LITHOLAPAXY  07/11/2011   Procedure: CYSTOSCOPY WITH LITHOLAPAXY;  Surgeon: Marissa Nestle, MD;  Location: [facility];  Service: Urology;  Laterality: N/A;  . ESOPHAGOGASTRODUODENOSCOPY  07/03/2011   Procedure: ESOPHAGOGASTRODUODENOSCOPY (EGD);  Surgeon: Rogene Houston, MD;  [facility];  Service: Endoscopy;  Laterality: N/A;  . HERNIA REPAIR  9833    umbilical   . INGUINAL HERNIA REPAIR Right 05/04/2017   Procedure: HERNIA REPAIR INGUINAL ADULT WITH MESH;  Surgeon: Aviva Signs, MD;  Location: [facility];  Service: General;  Laterality: Right;  . PARTIAL COLECTOMY  07/04/2011   Procedure: PARTIAL COLECTOMY;  Surgeon: Jamesetta So, MD;  Location: [facility];  Service: General;  Laterality: N/A;  . Prostage cancer    . PROSTATE BIOPSY    . ROBOT ASSISTED LAPAROSCOPIC RADICAL PROSTATECTOMY Bilateral 01/19/2015   Procedure: ROBOTIC ASSISTED LAPAROSCOPIC RADICAL PROSTATECTOMY AND BILATERAL PELVIC LYMPH NODE DISSECTION, LAPARASCOPIC  LYSIS OF ADHESIONS;  Surgeon: Ardis Hughs, MD;  Location: [facility];  Service: Urology;  Laterality: Bilateral;  . TRANSURETHRAL RESECTION OF PROSTATE  07/11/2011   Procedure: TRANSURETHRAL RESECTION OF THE PROSTATE (TURP);  Surgeon:  Marissa Nestle, MD;  Location: [facility];  Service: Urology;  Laterality: N/A;    There were no vitals filed for this visit.   Subjective Assessment - 09/25/20 1654    Subjective Patient says he is doing much better. He has not had any issues with his balance or dizziness since last week. Feeling good today.    Currently in Pain? No/denies                   Vestibular Assessment - 09/25/20 0001      Positional Testing   Dix-Hallpike Dix-Hallpike Left      Dix-Hallpike Left   Dix-Hallpike Left Duration Negative                              PT Short Term Goals - 09/25/20 1718      PT SHORT TERM GOAL #1   Title Patient will have full resolution of BPPV related vertigo for improved functional ability, allowing for return to PLOF with ADLs and bed mobility.    Time 2    Period Weeks    Status Achieved    Target Date 10/04/20                    Plan - 09/25/20 1717    Clinical Impression Statement Reassessed DixHallPike yielding negative result. Patient reports no onset of symptoms and shows no nystagmus today. Educated patient and his wife on findings. Perform static balance assessment. Patient showing fair static balance. Demonstrated tandem stance and single leg stance with support for HEP and issued handout. Answered all patient questions and encouraged patient to follow up with therapy services with any further questions or concerns. Patient being DC today with all goals met.    Examination-Activity Limitations Bed Mobility;Bend;Transfers    Examination-Participation Restrictions Community Activity;Yard Work;Other    Stability/Clinical Decision Making Stable/Uncomplicated    Rehab Potential Good    PT Treatment/Interventions Canalith Repostioning;Vestibular;Passive range of motion;Manual techniques;ADLs/Self Care Home Management;Therapeutic activities;Therapeutic exercise;Balance training;Patient/family education;Functional mobility  training;Stair training;Gait training    PT Next Visit Plan DC    PT Home Exercise Plan Education on bed positioning and avoiding side lying position for next several nights.    Consulted and Agree with Plan of Care Patient;Family member/caregiver    Family Member Consulted wife           Patient will benefit from skilled therapeutic intervention in order to improve the following deficits and impairments:  Dizziness,Decreased balance  Visit Diagnosis: BPPV (benign paroxysmal positional vertigo), left  Dizziness and giddiness     Problem List Patient Active Problem List   Diagnosis Date Noted  . Left thyroid nodule 01/13/2019  . Non-recurrent unilateral inguinal hernia without obstruction or gangrene   . GERD (gastroesophageal reflux disease) 01/26/2015  . TIA (transient ischemic attack)   . 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 Prostate cancer ([place]) 08/29/2014  .  Kidney stone on left side 08/02/2013  . Diarrhea 07/05/2013  . Short-segment Barrett's esophagus 02/09/2012  . Calculus, bladder 07/04/2011  . Tooth infection 07/03/2011  . Adenocarcinoma of colon ([place]) 07/03/2011  . Erosive gastritis 07/03/2011  . Microcytic hypochromic anemia 07/02/2011  . Hx of colonic polyp 07/02/2011  . Hyperglycemia 07/02/2011  . BPH (benign prostatic hyperplasia) 07/02/2011    5:20 PM, 09/25/20 Josue Hector PT DPT  Physical Therapist with [facility]  [phone] [place] [address] [place], [place], [address] Phone: [phone]   Fax:  [phone]  Name: William Burns MRN: [id number] Date of Birth: 1946-03-09

## 2020-10-02 ENCOUNTER — Ambulatory Visit (HOSPITAL_COMMUNITY): Payer: Medicare Other | Admitting: Physical Therapy

## 2020-10-09 ENCOUNTER — Ambulatory Visit (HOSPITAL_COMMUNITY): Payer: Medicare Other | Admitting: Physical Therapy

## 2020-10-15 DIAGNOSIS — Z125 Encounter for screening for malignant neoplasm of prostate: Secondary | ICD-10-CM | POA: Diagnosis not present

## 2020-10-15 DIAGNOSIS — R7301 Impaired fasting glucose: Secondary | ICD-10-CM | POA: Diagnosis not present

## 2020-10-15 DIAGNOSIS — W57XXXA Bitten or stung by nonvenomous insect and other nonvenomous arthropods, initial encounter: Secondary | ICD-10-CM | POA: Diagnosis not present

## 2020-10-15 DIAGNOSIS — G40509 Epileptic seizures related to external causes, not intractable, without status epilepticus: Secondary | ICD-10-CM | POA: Diagnosis not present

## 2020-10-15 DIAGNOSIS — F519 Sleep disorder not due to a substance or known physiological condition, unspecified: Secondary | ICD-10-CM | POA: Diagnosis not present

## 2020-10-15 DIAGNOSIS — E785 Hyperlipidemia, unspecified: Secondary | ICD-10-CM | POA: Diagnosis not present

## 2020-10-18 DIAGNOSIS — N1831 Chronic kidney disease, stage 3a: Secondary | ICD-10-CM | POA: Diagnosis not present

## 2020-10-18 DIAGNOSIS — E782 Mixed hyperlipidemia: Secondary | ICD-10-CM | POA: Diagnosis not present

## 2020-10-18 DIAGNOSIS — E669 Obesity, unspecified: Secondary | ICD-10-CM | POA: Diagnosis not present

## 2020-10-18 DIAGNOSIS — R413 Other amnesia: Secondary | ICD-10-CM | POA: Diagnosis not present

## 2020-10-18 DIAGNOSIS — E538 Deficiency of other specified B group vitamins: Secondary | ICD-10-CM | POA: Diagnosis not present

## 2020-10-18 DIAGNOSIS — M545 Low back pain, unspecified: Secondary | ICD-10-CM | POA: Diagnosis not present

## 2020-10-18 DIAGNOSIS — R7303 Prediabetes: Secondary | ICD-10-CM | POA: Diagnosis not present

## 2020-11-27 ENCOUNTER — Encounter (INDEPENDENT_AMBULATORY_CARE_PROVIDER_SITE_OTHER): Payer: Self-pay | Admitting: *Deleted

## 2020-12-09 DIAGNOSIS — Z20822 Contact with and (suspected) exposure to covid-19: Secondary | ICD-10-CM | POA: Diagnosis not present

## 2020-12-18 DIAGNOSIS — X32XXXD Exposure to sunlight, subsequent encounter: Secondary | ICD-10-CM | POA: Diagnosis not present

## 2020-12-18 DIAGNOSIS — D225 Melanocytic nevi of trunk: Secondary | ICD-10-CM | POA: Diagnosis not present

## 2020-12-18 DIAGNOSIS — L570 Actinic keratosis: Secondary | ICD-10-CM | POA: Diagnosis not present

## 2021-01-23 ENCOUNTER — Telehealth (INDEPENDENT_AMBULATORY_CARE_PROVIDER_SITE_OTHER): Payer: Self-pay

## 2021-01-23 ENCOUNTER — Other Ambulatory Visit (INDEPENDENT_AMBULATORY_CARE_PROVIDER_SITE_OTHER): Payer: Self-pay

## 2021-01-23 DIAGNOSIS — Z85038 Personal history of other malignant neoplasm of large intestine: Secondary | ICD-10-CM

## 2021-01-23 DIAGNOSIS — Z86010 Personal history of colonic polyps: Secondary | ICD-10-CM

## 2021-01-23 MED ORDER — PEG 3350-KCL-NA BICARB-NACL 420 G PO SOLR: 4000.0000 mL | ORAL | 0 refills | Status: DC

## 2021-01-23 NOTE — Telephone Encounter (Signed)
William Burns, CMA  

## 2021-01-23 NOTE — Telephone Encounter (Signed)
Referring MD/PCP: [place] Crane  Procedure: Tcs  Reason/Indication:  Hx of colon ca, Hx of polyps  Has patient had this procedure before?  yes  If so, when, by whom and where? 2017  Is there a family history of colon cancer?  no  Who?  What age when diagnosed?    Is patient diabetic? If yes, Type 1 or Type 2   no      Does patient have prosthetic heart valve or mechanical valve?  no  Do you have a pacemaker/defibrillator?  no  Has patient ever had endocarditis/atrial fibrillation? no  Does patient use oxygen? no  Has patient had joint replacement within last 12 months?  no  Is patient constipated or do they take laxatives? no  Does patient have a history of alcohol/drug use?  no  Have you had a stroke/heart attack last 6 mths? no  Do you take medicine for weight loss?  no  For male patients,: do you still have your menstrual cycle? N/A  Is patient on blood thinner such as Coumadin, Plavix and/or Aspirin? no  Medications: Memantine 5 mg 1 tab bid, protonix 40 mg daily, fish oil daily, Vit B12 daily, Probiotic daily, Loratadine 10 mg daily, MVI daily, Vit d3 daily, Meclizine 25 mg prn, Divalproex 250 mg daily  Allergies: nkda  Medication Adjustment per Dr Laural Golden: None  Procedure date & time: 02/06/21 12:50

## 2021-01-24 ENCOUNTER — Encounter (INDEPENDENT_AMBULATORY_CARE_PROVIDER_SITE_OTHER): Payer: Self-pay

## 2021-01-24 DIAGNOSIS — E782 Mixed hyperlipidemia: Secondary | ICD-10-CM | POA: Diagnosis not present

## 2021-01-24 DIAGNOSIS — R7303 Prediabetes: Secondary | ICD-10-CM | POA: Diagnosis not present

## 2021-01-24 DIAGNOSIS — G40509 Epileptic seizures related to external causes, not intractable, without status epilepticus: Secondary | ICD-10-CM | POA: Diagnosis not present

## 2021-01-25 NOTE — Patient Instructions (Signed)
William Burns  01/25/2021     '@PREFPERIOPPHARMACY'$ @   Your procedure is scheduled on  02/06/2021.   Report to [facility] at  1100 A.M.   Call this number if you have problems the morning of surgery:  [phone]   Remember:  Follow the diet and prep instructions given to you by the office.    Take these medicines the morning of surgery with A SIP OF WATER                     depakote, namenda, claritin, protonix.     Do not wear jewelry, make-up or nail polish.  Do not wear lotions, powders, or perfumes, or deodorant.  Do not shave 48 hours prior to surgery.  Men may shave face and neck.  Do not bring valuables to the hospital.  [facility] is not responsible for any belongings or valuables.  Contacts, dentures or bridgework may not be worn into surgery.  Leave your suitcase in the car.  After surgery it may be brought to your room.  For patients admitted to the hospital, discharge time will be determined by your treatment team.  Patients discharged the day of surgery will not be allowed to drive home and must have someone with them for 24 hours.    Special instructions:   DO NOT smoke tobacco or vape for 24 hours before your procedure.  Please read over the following fact sheets that you were given. Anesthesia Post-op Instructions and Care and Recovery After Surgery      Colonoscopy, Adult, Care After This sheet gives you information about how to care for yourself after your procedure. Your health care provider may also give you more specific instructions. If you have problems or questions, contact your health careprovider. What can I expect after the procedure? After the procedure, it is common to have: A small amount of blood in your stool for 24 hours after the procedure. Some gas. Mild cramping or bloating of your abdomen. Follow these instructions at home: Eating and drinking  Drink enough fluid to keep your urine pale yellow. Follow instructions  from your health care provider about eating or drinking restrictions. Resume your normal diet as instructed by your health care provider. Avoid heavy or fried foods that are hard to digest.  Activity Rest as told by your health care provider. Avoid sitting for a long time without moving. Get up to take short walks every 1-2 hours. This is important to improve blood flow and breathing. Ask for help if you feel weak or unsteady. Return to your normal activities as told by your health care provider. Ask your health care provider what activities are safe for you. Managing cramping and bloating  Try walking around when you have cramps or feel bloated. Apply heat to your abdomen as told by your health care provider. Use the heat source that your health care provider recommends, such as a moist heat pack or a heating pad. Place a towel between your skin and the heat source. Leave the heat on for 20-30 minutes. Remove the heat if your skin turns bright red. This is especially important if you are unable to feel pain, heat, or cold. You may have a greater risk of getting burned.  General instructions If you were given a sedative during the procedure, it can affect you for several hours. Do not drive or operate machinery until your health care provider says that it  is safe. For the first 24 hours after the procedure: Do not sign important documents. Do not drink alcohol. Do your regular daily activities at a slower pace than normal. Eat soft foods that are easy to digest. Take over-the-counter and prescription medicines only as told by your health care provider. Keep all follow-up visits as told by your health care provider. This is important. Contact a health care provider if: You have blood in your stool 2-3 days after the procedure. Get help right away if you have: More than a small spotting of blood in your stool. Large blood clots in your stool. Swelling of your abdomen. Nausea or  vomiting. A fever. Increasing pain in your abdomen that is not relieved with medicine. Summary After the procedure, it is common to have a small amount of blood in your stool. You may also have mild cramping and bloating of your abdomen. If you were given a sedative during the procedure, it can affect you for several hours. Do not drive or operate machinery until your health care provider says that it is safe. Get help right away if you have a lot of blood in your stool, nausea or vomiting, a fever, or increased pain in your abdomen. This information is not intended to replace advice given to you by your health care provider. Make sure you discuss any questions you have with your healthcare provider. Document Revised: 05/13/2019 Document Reviewed: 12/13/2018 Elsevier Patient Education  [place] After This sheet gives you information about how to care for yourself after your procedure. Your health care provider may also give you more specific instructions. If you have problems or questions, contact your health careprovider. What can I expect after the procedure? After the procedure, it is common to have: Tiredness. Forgetfulness about what happened after the procedure. Impaired judgment for important decisions. Nausea or vomiting. Some difficulty with balance. Follow these instructions at home: For the time period you were told by your health care provider:     Rest as needed. Do not participate in activities where you could fall or become injured. Do not drive or use machinery. Do not drink alcohol. Do not take sleeping pills or medicines that cause drowsiness. Do not make important decisions or sign legal documents. Do not take care of children on your own. Eating and drinking Follow the diet that is recommended by your health care provider. Drink enough fluid to keep your urine pale yellow. If you vomit: Drink water, juice, or soup when  you can drink without vomiting. Make sure you have little or no nausea before eating solid foods. General instructions Have a responsible adult stay with you for the time you are told. It is important to have someone help care for you until you are awake and alert. Take over-the-counter and prescription medicines only as told by your health care provider. If you have sleep apnea, surgery and certain medicines can increase your risk for breathing problems. Follow instructions from your health care provider about wearing your sleep device: Anytime you are sleeping, including during daytime naps. While taking prescription pain medicines, sleeping medicines, or medicines that make you drowsy. Avoid smoking. Keep all follow-up visits as told by your health care provider. This is important. Contact a health care provider if: You keep feeling nauseous or you keep vomiting. You feel light-headed. You are still sleepy or having trouble with balance after 24 hours. You develop a rash. You have a fever. You have redness  or swelling around the IV site. Get help right away if: You have trouble breathing. You have new-onset confusion at home. Summary For several hours after your procedure, you may feel tired. You may also be forgetful and have poor judgment. Have a responsible adult stay with you for the time you are told. It is important to have someone help care for you until you are awake and alert. Rest as told. Do not drive or operate machinery. Do not drink alcohol or take sleeping pills. Get help right away if you have trouble breathing, or if you suddenly become confused. This information is not intended to replace advice given to you by your health care provider. Make sure you discuss any questions you have with your healthcare provider. Document Revised: 02/02/2020 Document Reviewed: 04/21/2019 Elsevier Patient Education  2022 [employer].

## 2021-01-30 DIAGNOSIS — G40909 Epilepsy, unspecified, not intractable, without status epilepticus: Secondary | ICD-10-CM | POA: Diagnosis not present

## 2021-01-30 DIAGNOSIS — K219 Gastro-esophageal reflux disease without esophagitis: Secondary | ICD-10-CM | POA: Diagnosis not present

## 2021-01-30 DIAGNOSIS — H9193 Unspecified hearing loss, bilateral: Secondary | ICD-10-CM | POA: Diagnosis not present

## 2021-01-30 DIAGNOSIS — E538 Deficiency of other specified B group vitamins: Secondary | ICD-10-CM | POA: Diagnosis not present

## 2021-01-30 DIAGNOSIS — N1831 Chronic kidney disease, stage 3a: Secondary | ICD-10-CM | POA: Diagnosis not present

## 2021-01-30 DIAGNOSIS — E782 Mixed hyperlipidemia: Secondary | ICD-10-CM | POA: Diagnosis not present

## 2021-01-30 DIAGNOSIS — R7303 Prediabetes: Secondary | ICD-10-CM | POA: Diagnosis not present

## 2021-01-30 DIAGNOSIS — R413 Other amnesia: Secondary | ICD-10-CM | POA: Diagnosis not present

## 2021-01-30 DIAGNOSIS — H8113 Benign paroxysmal vertigo, bilateral: Secondary | ICD-10-CM | POA: Diagnosis not present

## 2021-01-30 DIAGNOSIS — M545 Low back pain, unspecified: Secondary | ICD-10-CM | POA: Diagnosis not present

## 2021-01-30 DIAGNOSIS — Z0001 Encounter for general adult medical examination with abnormal findings: Secondary | ICD-10-CM | POA: Diagnosis not present

## 2021-01-30 DIAGNOSIS — E669 Obesity, unspecified: Secondary | ICD-10-CM | POA: Diagnosis not present

## 2021-01-31 ENCOUNTER — Encounter (HOSPITAL_COMMUNITY)
Admission: RE | Admit: 2021-01-31 | Discharge: 2021-01-31 | Disposition: A | Discharge status: Home / Self Care | Payer: Medicare Other | Source: Ambulatory Visit | Attending: Internal Medicine | Admitting: Internal Medicine

## 2021-01-31 ENCOUNTER — Other Ambulatory Visit: Payer: Self-pay

## 2021-01-31 ENCOUNTER — Encounter (HOSPITAL_COMMUNITY): Payer: Self-pay

## 2021-02-06 ENCOUNTER — Ambulatory Visit (HOSPITAL_COMMUNITY)
Admission: RE | Admit: 2021-02-06 | Discharge: 2021-02-06 | Disposition: A | Discharge status: Home / Self Care | Payer: Medicare Other | Attending: Internal Medicine | Admitting: Internal Medicine

## 2021-02-06 ENCOUNTER — Ambulatory Visit (HOSPITAL_COMMUNITY): Payer: Medicare Other | Admitting: Certified Registered"

## 2021-02-06 ENCOUNTER — Encounter (HOSPITAL_COMMUNITY): Payer: Self-pay | Admitting: Internal Medicine

## 2021-02-06 ENCOUNTER — Other Ambulatory Visit: Payer: Self-pay

## 2021-02-06 ENCOUNTER — Encounter (HOSPITAL_COMMUNITY)
Admission: RE | Disposition: A | Discharge status: Home / Self Care | Payer: Self-pay | Source: Home / Self Care | Attending: Internal Medicine

## 2021-02-06 DIAGNOSIS — K5900 Constipation, unspecified: Secondary | ICD-10-CM | POA: Insufficient documentation

## 2021-02-06 DIAGNOSIS — Z1211 Encounter for screening for malignant neoplasm of colon: Secondary | ICD-10-CM | POA: Diagnosis not present

## 2021-02-06 DIAGNOSIS — Z87891 Personal history of nicotine dependence: Secondary | ICD-10-CM | POA: Insufficient documentation

## 2021-02-06 DIAGNOSIS — Z86010 Personal history of colonic polyps: Secondary | ICD-10-CM

## 2021-02-06 DIAGNOSIS — K635 Polyp of colon: Secondary | ICD-10-CM | POA: Diagnosis not present

## 2021-02-06 DIAGNOSIS — K5730 Diverticulosis of large intestine without perforation or abscess without bleeding: Secondary | ICD-10-CM | POA: Diagnosis not present

## 2021-02-06 DIAGNOSIS — Z8673 Personal history of transient ischemic attack (TIA), and cerebral infarction without residual deficits: Secondary | ICD-10-CM | POA: Diagnosis not present

## 2021-02-06 DIAGNOSIS — Z08 Encounter for follow-up examination after completed treatment for malignant neoplasm: Secondary | ICD-10-CM | POA: Diagnosis not present

## 2021-02-06 DIAGNOSIS — R569 Unspecified convulsions: Secondary | ICD-10-CM | POA: Diagnosis not present

## 2021-02-06 DIAGNOSIS — K644 Residual hemorrhoidal skin tags: Secondary | ICD-10-CM | POA: Diagnosis not present

## 2021-02-06 DIAGNOSIS — Z85038 Personal history of other malignant neoplasm of large intestine: Secondary | ICD-10-CM | POA: Insufficient documentation

## 2021-02-06 DIAGNOSIS — Z980 Intestinal bypass and anastomosis status: Secondary | ICD-10-CM | POA: Insufficient documentation

## 2021-02-06 DIAGNOSIS — Z79899 Other long term (current) drug therapy: Secondary | ICD-10-CM | POA: Diagnosis not present

## 2021-02-06 DIAGNOSIS — Z9049 Acquired absence of other specified parts of digestive tract: Secondary | ICD-10-CM | POA: Diagnosis not present

## 2021-02-06 DIAGNOSIS — D123 Benign neoplasm of transverse colon: Secondary | ICD-10-CM | POA: Diagnosis not present

## 2021-02-06 HISTORY — PX: COLONOSCOPY WITH PROPOFOL: SHX5780

## 2021-02-06 HISTORY — PX: POLYPECTOMY: SHX5525

## 2021-02-06 LAB — HM COLONOSCOPY

## 2021-02-06 SURGERY — COLONOSCOPY WITH PROPOFOL
Anesthesia: General

## 2021-02-06 MED ORDER — PROPOFOL 10 MG/ML IV BOLUS
INTRAVENOUS | Status: DC | PRN
  Administered 2021-02-06: 25 mg via INTRAVENOUS
  Administered 2021-02-06: 40 mg via INTRAVENOUS
  Administered 2021-02-06: 80 mg via INTRAVENOUS
  Administered 2021-02-06: 30 mg via INTRAVENOUS
  Administered 2021-02-06: 25 mg via INTRAVENOUS
  Administered 2021-02-06: 30 mg via INTRAVENOUS

## 2021-02-06 MED ORDER — EPHEDRINE SULFATE 50 MG/ML IJ SOLN
INTRAMUSCULAR | Status: DC | PRN
Start: 2021-02-06 — End: 2021-02-06
  Administered 2021-02-06 (×2): 10 mg via INTRAVENOUS

## 2021-02-06 MED ORDER — EPHEDRINE 5 MG/ML INJ
INTRAVENOUS | Status: AC
  Filled 2021-02-06: qty 5

## 2021-02-06 MED ORDER — STERILE WATER FOR IRRIGATION IR SOLN
Status: DC | PRN
  Administered 2021-02-06: 100 mL

## 2021-02-06 MED ORDER — LACTATED RINGERS IV SOLN: INTRAVENOUS | Status: DC

## 2021-02-06 NOTE — Discharge Instructions (Signed)
No aspirin or NSAIDs for 24 hours ?Resume scheduled medications as before ?High-fiber diet ?No driving for 24 hours. ?Physician will call with biopsy results. ?

## 2021-02-06 NOTE — Anesthesia Postprocedure Evaluation (Signed)
Anesthesia Post Note  Patient: William Burns  Procedure(s) Performed: COLONOSCOPY WITH PROPOFOL POLYPECTOMY  Patient location during evaluation: Phase II Anesthesia Type: General Level of consciousness: awake Pain management: pain level controlled Vital Signs Assessment: post-procedure vital signs reviewed and stable Respiratory status: spontaneous breathing and respiratory function stable Cardiovascular status: blood pressure returned to baseline and stable Postop Assessment: no headache and no apparent nausea or vomiting Anesthetic complications: no Comments: Late entry   No notable events documented.   Last Vitals:  Vitals:   02/06/21 1121 02/06/21 1409  BP: 133/66 (!) 102/45  Pulse: 71 76  Resp: 17 19  Temp: 37.1 C 36.7 C  SpO2: 96% 98%    Last Pain:  Vitals:   02/06/21 1409  TempSrc: Oral  PainSc: 0-No pain                 Louann Sjogren

## 2021-02-06 NOTE — Transfer of Care (Signed)
Immediate Anesthesia Transfer of Care Note  Patient: William Burns  Procedure(s) Performed: COLONOSCOPY WITH PROPOFOL POLYPECTOMY  Patient Location: Short Stay  Anesthesia Type:General  Level of Consciousness: awake, alert  and oriented  Airway & Oxygen Therapy: Patient Spontanous Breathing  Post-op Assessment: Report given to RN and Post -op Vital signs reviewed and stable  Post vital signs: Reviewed and stable  Last Vitals:  Vitals Value Taken Time  BP    Temp    Pulse 74 02/06/21  1407  Resp 14 02/06/21  1407  SpO2 95 02/06/21  1407    Last Pain:  Vitals:   02/06/21 1339  TempSrc:   PainSc: 0-No pain      Patients Stated Pain Goal: 7 ([id number])  Complications: No notable events documented.

## 2021-02-06 NOTE — Op Note (Signed)
[facility] Patient Name: William Burns Procedure Date: 02/06/2021 1:24 PM MRN: [id number] Date of Birth: February 23, 1946 Attending MD: Hildred Laser , MD CSN: [id number] Age: 75 Admit Type: Outpatient Procedure:                Colonoscopy Indications:              High risk colon cancer surveillance: Personal                            history of colon cancer Providers:                Hildred Laser, MD, Rosina Lowenstein, RN, Raphael Gibney,                            Technician Referring MD:             [name], MD Medicines:                Propofol per Anesthesia Complications:            No immediate complications. Estimated Blood Loss:     Estimated blood loss was minimal. Procedure:                Pre-Anesthesia Assessment:                           - Prior to the procedure, a History and Physical                            was performed, and patient medications and                            allergies were reviewed. The patient's tolerance of                            previous anesthesia was also reviewed. The risks                            and benefits of the procedure and the sedation                            options and risks were discussed with the patient.                            All questions were answered, and informed consent                            was obtained. Prior Anticoagulants: The patient has                            taken no previous anticoagulant or antiplatelet                            agents. ASA Grade Assessment: II - A patient with  mild systemic disease. After reviewing the risks                            and benefits, the patient was deemed in                            satisfactory condition to undergo the procedure.                           After obtaining informed consent, the colonoscope                            was passed under direct vision. Throughout the                            procedure, the patient's  blood pressure, pulse, and                            oxygen saturations were monitored continuously. The                            [id number]-[id number] scope was introduced through                            the anus and advanced to the the ileocolonic                            anastomosis. The colonoscopy was performed without                            difficulty. The patient tolerated the procedure                            well. The quality of the bowel preparation was                            good. The terminal ileum and the rectum were                            photographed. Scope In: 1:43:49 PM Scope Out: 2:03:03 PM Scope Withdrawal Time: 0 hours 12 minutes 17 seconds  Total Procedure Duration: 0 hours 19 minutes 14 seconds  Findings:      The perianal and digital rectal examinations were normal.      There was evidence of a prior end-to-side ileo-colonic anastomosis at       the hepatic flexure. This was patent and was characterized by healthy       appearing mucosa.      A small polyp was found in the splenic flexure. The polyp was sessile.       The polyp was removed with a cold snare. Resection was complete, but the       polyp tissue was only partially retrieved. The pathology specimen was       placed into Bottle Number 1.      A diminutive polyp was found in the splenic flexure.  Biopsies were taken       with a cold forceps for histology. The pathology specimen was placed       into Bottle Number 1.      Scattered diverticula were found in the sigmoid colon and descending       colon.      External hemorrhoids were found during retroflexion. The hemorrhoids       were small. Impression:               - Patent end-to-side ileo-colonic anastomosis,                            characterized by healthy appearing mucosa.                           - One small polyp at the splenic flexure, removed                            with a cold snare. Complete resection. Partial                             retrieval.                           - One diminutive polyp at the splenic flexure.                            Biopsied.                           - Diverticulosis in the sigmoid colon and in the                            descending colon.                           - External hemorrhoids. Moderate Sedation:      Per Anesthesia Care Recommendation:           - Patient has a contact number available for                            emergencies. The signs and symptoms of potential                            delayed complications were discussed with the                            patient. Return to normal activities tomorrow.                            Written discharge instructions were provided to the                            patient.                           - High fiber diet today.                           -  Continue present medications.                           - No aspirin, ibuprofen, naproxen, or other                            non-steroidal anti-inflammatory drugs for 1 day.                           - Await pathology results.                           - Repeat colonoscopy in 5 years for surveillance. Procedure Code(s):        --- Professional ---                           [id number], Colonoscopy, flexible; with removal of                            tumor(s), polyp(s), or other lesion(s) by snare                            technique                           45380, 50, Colonoscopy, flexible; with biopsy,                            single or multiple Diagnosis Code(s):        --- Professional ---                           [id number], Personal history of other malignant                            neoplasm of large intestine                           Z98.0, Intestinal bypass and anastomosis status                           K63.5, Polyp of colon                           K64.4, Residual hemorrhoidal skin tags                           K57.30, Diverticulosis of  large intestine without                            perforation or abscess without bleeding CPT copyright 2019 [employer]. All rights reserved. The codes documented in this report are preliminary and upon coder review may  be revised to meet current compliance requirements. Hildred Laser, MD Hildred Laser, MD 02/06/2021 2:19:06 PM This report has been signed electronically. Number of Addenda: 0

## 2021-02-06 NOTE — Anesthesia Preprocedure Evaluation (Signed)
Anesthesia Evaluation  Patient identified by MRN, date of birth, ID band Patient awake    Reviewed: Allergy & Precautions, H&P , NPO status , Patient's Chart, lab work & pertinent test results, reviewed documented beta blocker date and time   History of Anesthesia Complications (+) DIFFICULT AIRWAY and history of anesthetic complications  Airway Mallampati: II  TM Distance: >3 FB Neck ROM: full    Dental no notable dental hx.    Pulmonary neg pulmonary ROS, former smoker,    Pulmonary exam normal breath sounds clear to auscultation       Cardiovascular Exercise Tolerance: Good negative cardio ROS   Rhythm:regular Rate:Normal     Neuro/Psych TIAnegative psych ROS   GI/Hepatic Neg liver ROS, PUD, GERD  Medicated,  Endo/Other  negative endocrine ROS  Renal/GU Renal disease  negative genitourinary   Musculoskeletal   Abdominal   Peds  Hematology  (+) Blood dyscrasia, anemia ,   Anesthesia Other Findings   Reproductive/Obstetrics negative OB ROS                             Anesthesia Physical Anesthesia Plan  ASA: 2  Anesthesia Plan: General   Post-op Pain Management:    Induction:   PONV Risk Score and Plan: Propofol infusion  Airway Management Planned:   Additional Equipment:   Intra-op Plan:   Post-operative Plan:   Informed Consent: I have reviewed the patients History and Physical, chart, labs and discussed the procedure including the risks, benefits and alternatives for the proposed anesthesia with the patient or authorized representative who has indicated his/her understanding and acceptance.     Dental Advisory Given  Plan Discussed with: CRNA  Anesthesia Plan Comments:         Anesthesia Quick Evaluation

## 2021-02-06 NOTE — H&P (Signed)
William Burns is an 75 y.o. male.   Chief Complaint: Patient is here for colonoscopy. HPI: Patient is 75 year old Caucasian male with history of colon carcinoma, status post right-colectomy in January 2013 who is here for surveillance colonoscopy.  His last exam was in July 2017 with removal of 3 small polyps and they were not adenomas.  He denies abdominal pain or rectal bleeding.  States he suffered a seizure in January this year.  Etiology was not found.  He was begun on Depakote.  He has not had any more seizure activity but he has developed constipation.  He is eating applesauce which has helped. Family history is negative for colon carcinoma but one of his 2 sons have had multiple polyps removed. He does not take aspirin or NSAIDs.  Past Medical History:  Diagnosis Date   BPH (benign prostatic hyperplasia)    Cancer ([place])    colon   Cataract    Colonic mass 07/03/2011   Difficult intubation    extra long tube for intub per wife; states "his neck is longer than regular people" and slow to wake up    Erosive gastritis 07/03/2011   GERD (gastroesophageal reflux disease)    History of kidney stones    Kidney stones 2011   Renal calculus    Tooth infection 07/03/2011    Past Surgical History:  Procedure Laterality Date   CATARACT EXTRACTION W/PHACO Left 08/23/2012   Procedure: CATARACT EXTRACTION PHACO AND INTRAOCULAR LENS PLACEMENT ([place]);  Surgeon: Tonny Branch, MD;  Location: [facility];  Service: Ophthalmology;  Laterality: Left;  CDE 16.64   CATARACT EXTRACTION W/PHACO Right 06/09/2013   Procedure: CATARACT EXTRACTION PHACO AND INTRAOCULAR LENS PLACEMENT ([facility]) CDE=10.86;  Surgeon: Tonny Branch, MD;  Location: [facility];  Service: Ophthalmology;  Laterality: Right;   COLON SURGERY     COLONOSCOPY  07/03/2011   Procedure: COLONOSCOPY;  Surgeon: Rogene Houston, MD;  [facility];  Service: Endoscopy;  Laterality: N/A;   COLONOSCOPY N/A 09/16/2012   Procedure: COLONOSCOPY;  Surgeon:  Rogene Houston, MD;  [facility];  Service: Endoscopy;  Laterality: N/A;  930   COLONOSCOPY N/A 12/05/2015   Procedure: COLONOSCOPY;  Surgeon: Rogene Houston, MD;  [facility];  Service: Endoscopy;  Laterality: N/A;  1200   CYSTOSCOPY  07/09/2011   Procedure: CYSTOSCOPY FLEXIBLE;  Surgeon: Marissa Nestle, MD;  Location: [facility];  Service: Urology;  Laterality: N/A;   CYSTOSCOPY WITH LITHOLAPAXY  07/11/2011   Procedure: CYSTOSCOPY WITH LITHOLAPAXY;  Surgeon: Marissa Nestle, MD;  Location: [facility];  Service: Urology;  Laterality: N/A;   ESOPHAGOGASTRODUODENOSCOPY  07/03/2011   Procedure: ESOPHAGOGASTRODUODENOSCOPY (EGD);  Surgeon: Rogene Houston, MD;  [facility];  Service: Endoscopy;  Laterality: N/A;   HERNIA REPAIR  [id number]    umbilical    INGUINAL HERNIA REPAIR Right 05/04/2017   Procedure: HERNIA REPAIR INGUINAL ADULT WITH MESH;  Surgeon: Aviva Signs, MD;  Location: [facility];  Service: General;  Laterality: Right;   PARTIAL COLECTOMY  07/04/2011   Procedure: PARTIAL COLECTOMY;  Surgeon: Jamesetta So, MD;  Location: [facility];  Service: General;  Laterality: N/A;   Prostage cancer     PROSTATE BIOPSY     ROBOT ASSISTED LAPAROSCOPIC RADICAL PROSTATECTOMY Bilateral 01/19/2015   Procedure: ROBOTIC ASSISTED LAPAROSCOPIC RADICAL PROSTATECTOMY AND BILATERAL PELVIC LYMPH NODE DISSECTION, LAPARASCOPIC  LYSIS OF ADHESIONS;  Surgeon: Ardis Hughs, MD;  Location: [facility];  Service:  Urology;  Laterality: Bilateral;   TRANSURETHRAL RESECTION OF PROSTATE  07/11/2011   Procedure: TRANSURETHRAL RESECTION OF THE PROSTATE (TURP);  Surgeon: Marissa Nestle, MD;  Location: [facility];  Service: Urology;  Laterality: N/A;    Family History  Problem Relation Age of Onset   Alzheimer's disease Mother    Alzheimer's disease Father    Aneurysm Brother        brain   Anuerysm Brother    Healthy Son    Healthy Son    Colon cancer Neg Hx    Social History:  reports that he quit  smoking about 31 years ago. His smoking use included cigarettes. He has a 50.00 pack-year smoking history. He has never used smokeless tobacco. He reports that he does not drink alcohol and does not use drugs.  Allergies: No Known Allergies  Medications Prior to Admission  Medication Sig Dispense Refill   Cholecalciferol (VITAMIN D3) 125 MCG (5000 UT) TABS Take by mouth in the morning.     Cyanocobalamin (VITAMIN B-12) 5000 MCG SUBL Place 5,000 mcg under the tongue in the morning.     divalproex (DEPAKOTE) 250 MG DR tablet Take 250 mg by mouth in the morning and at bedtime.     Lactobacillus (ACIDOPHILUS PO) Take 1 capsule by mouth in the morning. Now Pb8 Acidophilus     loratadine (CLARITIN) 10 MG tablet Take 10 mg by mouth in the morning.     memantine (NAMENDA) 5 MG tablet Take 5 mg by mouth in the morning and at bedtime.     Multiple Vitamin (MULTIVITAMIN WITH MINERALS) TABS tablet Take 1 tablet by mouth in the morning. Centrum Silver for Men 50+     naphazoline-pheniramine (ALLERGY EYE) 0.025-0.3 % ophthalmic solution Place 1-2 drops into both eyes 4 (four) times daily as needed (eye irritation (due to dust/dirt exposure)).     Omega-3 Fatty Acids (FISH OIL) 1200 MG CAPS Take 1,200 mg by mouth in the morning.     pantoprazole (PROTONIX) 40 MG tablet TAKE 1 TABLET BY MOUTH ONCE DAILY. 90 tablet 3   polyethylene glycol-electrolytes (TRILYTE) 420 g solution Take 4,000 mLs by mouth as directed. 4000 mL 0    No results found for this or any previous visit (from the past 48 hour(s)). No results found.  Review of Systems  Blood pressure 133/66, pulse 71, temperature 98.7 F (37.1 C), temperature source Oral, resp. rate 17, height '6\' 3"'$  (1.905 m), weight 109.8 kg, SpO2 96 %. Physical Exam HENT:     Mouth/Throat:     Mouth: Mucous membranes are moist.     Pharynx: Oropharynx is clear.  Eyes:     General: No scleral icterus.    Conjunctiva/sclera: Conjunctivae normal.  Cardiovascular:      Rate and Rhythm: Normal rate and regular rhythm.     Heart sounds: Normal heart sounds. No murmur heard. Pulmonary:     Effort: Pulmonary effort is normal.     Breath sounds: Normal breath sounds.  Abdominal:     General: There is no distension.     Palpations: Abdomen is soft. There is no mass.     Tenderness: There is no abdominal tenderness.  Musculoskeletal:        General: No swelling.     Cervical back: Neck supple.  Lymphadenopathy:     Cervical: No cervical adenopathy.  Skin:    General: Skin is warm and dry.  Neurological:     Mental Status: He is alert.  Assessment/Plan  History of colon carcinoma. Surveillance colonoscopy.  Hildred Laser, MD 02/06/2021, 1:35 PM

## 2021-02-08 LAB — SURGICAL PATHOLOGY

## 2021-02-12 ENCOUNTER — Inpatient Hospital Stay (HOSPITAL_COMMUNITY): Payer: Medicare Other | Attending: Hematology

## 2021-02-12 ENCOUNTER — Other Ambulatory Visit: Payer: Self-pay

## 2021-02-12 DIAGNOSIS — Z8546 Personal history of malignant neoplasm of prostate: Secondary | ICD-10-CM | POA: Diagnosis not present

## 2021-02-12 DIAGNOSIS — Z9049 Acquired absence of other specified parts of digestive tract: Secondary | ICD-10-CM | POA: Diagnosis not present

## 2021-02-12 DIAGNOSIS — R918 Other nonspecific abnormal finding of lung field: Secondary | ICD-10-CM | POA: Diagnosis not present

## 2021-02-12 DIAGNOSIS — C61 Malignant neoplasm of prostate: Secondary | ICD-10-CM

## 2021-02-12 DIAGNOSIS — E041 Nontoxic single thyroid nodule: Secondary | ICD-10-CM | POA: Diagnosis not present

## 2021-02-12 DIAGNOSIS — Z85038 Personal history of other malignant neoplasm of large intestine: Secondary | ICD-10-CM | POA: Insufficient documentation

## 2021-02-12 DIAGNOSIS — C189 Malignant neoplasm of colon, unspecified: Secondary | ICD-10-CM

## 2021-02-12 LAB — PSA: Prostatic Specific Antigen: <0.01 ng/mL (ref 0.00–4.00)

## 2021-02-12 LAB — COMPREHENSIVE METABOLIC PANEL
ALT: 22 U/L (ref 0–44)
AST: 31 U/L (ref 15–41)
Albumin: 3.8 g/dL (ref 3.5–5.0)
Alkaline Phosphatase: 54 U/L (ref 38–126)
Anion gap: 9 (ref 5–15)
BUN: 11 mg/dL (ref 8–23)
CO2: 29 mmol/L (ref 22–32)
Calcium: 9 mg/dL (ref 8.9–10.3)
Chloride: 104 mmol/L (ref 98–111)
Creatinine, Ser: 0.87 mg/dL (ref 0.61–1.24)
GFR, Estimated: >60 mL/min (ref >60)
Glucose, Bld: 99 mg/dL (ref 70–99)
Potassium: 4.1 mmol/L (ref 3.5–5.1)
Sodium: 142 mmol/L (ref 135–145)
Total Bilirubin: 0.7 mg/dL (ref 0.3–1.2)
Total Protein: 6.7 g/dL (ref 6.5–8.1)

## 2021-02-12 LAB — CBC WITH DIFFERENTIAL/PLATELET
Abs Immature Granulocytes: 0.01 10*3/uL (ref 0.00–0.07)
Basophils Absolute: 0.1 10*3/uL (ref 0.0–0.1)
Basophils Relative: 1 %
Eosinophils Absolute: 0.2 10*3/uL (ref 0.0–0.5)
Eosinophils Relative: 4 %
HCT: 42.7 % (ref 39.0–52.0)
Hemoglobin: 13.9 g/dL (ref 13.0–17.0)
Immature Granulocytes: 0 %
Lymphocytes Relative: 29 %
Lymphs Abs: 1.8 10*3/uL (ref 0.7–4.0)
MCH: 30.5 pg (ref 26.0–34.0)
MCHC: 32.6 g/dL (ref 30.0–36.0)
MCV: 93.6 fL (ref 80.0–100.0)
Monocytes Absolute: 0.5 10*3/uL (ref 0.1–1.0)
Monocytes Relative: 8 %
Neutro Abs: 3.7 10*3/uL (ref 1.7–7.7)
Neutrophils Relative %: 58 %
Platelets: 247 10*3/uL (ref 150–400)
RBC: 4.56 MIL/uL (ref 4.22–5.81)
RDW: 13.2 % (ref 11.5–15.5)
WBC: 6.4 10*3/uL (ref 4.0–10.5)
nRBC: 0 % (ref 0.0–0.2)

## 2021-02-13 ENCOUNTER — Encounter (HOSPITAL_COMMUNITY): Payer: Self-pay | Admitting: Internal Medicine

## 2021-02-13 LAB — CEA: CEA: 2.7 ng/mL (ref 0.0–4.7)

## 2021-02-18 NOTE — Progress Notes (Signed)
[facility] [address] [place] [address]   CLINIC:  Medical Oncology/Hematology  PCP:  Celene Squibb, MD [address] [place] [address] [phone]   REASON FOR VISIT:  Follow-up for  cecal and prostate cancer  PRIOR THERAPY:  1. Right hemicolectomy in 07/2011. 2. Radical prostatectomy on 01/19/2015.  NGS Results: not done  CURRENT THERAPY: surveillance  BRIEF ONCOLOGIC HISTORY:  Oncology History   No history exists.    CANCER STAGING: Cancer Staging No matching staging information was found for the patient.  INTERVAL HISTORY:  Mr. William Burns, a 75 y.o. male, returns for routine follow-up of his cecal and prostate cancer. Detavion was last seen on 02/14/2020.   Today he reports feeling good. He denies any new pains and bleeding issues. His appetite is good.   REVIEW OF SYSTEMS:  Review of Systems  Constitutional:  Negative for appetite change and fatigue.  HENT:   Negative for nosebleeds.   Respiratory:  Negative for hemoptysis.   Gastrointestinal:  Negative for blood in stool.  Genitourinary:  Negative for hematuria.   Hematological:  Does not bruise/bleed easily.  All other systems reviewed and are negative.  PAST MEDICAL/SURGICAL HISTORY:  Past Medical History:  Diagnosis Date   BPH (benign prostatic hyperplasia)    Cancer ([place])    colon   Cataract    Colonic mass 07/03/2011   Difficult intubation    extra long tube for intub per wife; states "his neck is longer than regular people" and slow to wake up    Erosive gastritis 07/03/2011   GERD (gastroesophageal reflux disease)    History of kidney stones    Kidney stones 2011   Renal calculus    Tooth infection 07/03/2011   Past Surgical History:  Procedure Laterality Date   CATARACT EXTRACTION W/PHACO Left 08/23/2012   Procedure: CATARACT EXTRACTION PHACO AND INTRAOCULAR LENS PLACEMENT ([place]);  Surgeon: Tonny Branch, MD;  Location: [facility];  Service: Ophthalmology;   Laterality: Left;  CDE 16.64   CATARACT EXTRACTION W/PHACO Right 06/09/2013   Procedure: CATARACT EXTRACTION PHACO AND INTRAOCULAR LENS PLACEMENT ([facility]) CDE=10.86;  Surgeon: Tonny Branch, MD;  Location: [facility];  Service: Ophthalmology;  Laterality: Right;   COLON SURGERY     COLONOSCOPY  07/03/2011   Procedure: COLONOSCOPY;  Surgeon: Rogene Houston, MD;  Location: [facility];  Service: Endoscopy;  Laterality: N/A;   COLONOSCOPY N/A 09/16/2012   Procedure: COLONOSCOPY;  Surgeon: Rogene Houston, MD;  Location: [facility];  Service: Endoscopy;  Laterality: N/A;  930   COLONOSCOPY N/A 12/05/2015   Procedure: COLONOSCOPY;  Surgeon: Rogene Houston, MD;  [facility]: [facility];  Service: Endoscopy;  Laterality: N/A;  1200   COLONOSCOPY WITH PROPOFOL N/A 02/06/2021   Procedure: COLONOSCOPY WITH PROPOFOL;  Surgeon: Rogene Houston, MD;  Location: [facility];  Service: Endoscopy;  Laterality: N/A;  12:50   CYSTOSCOPY  07/09/2011   Procedure: CYSTOSCOPY FLEXIBLE;  Surgeon: Marissa Nestle, MD;  Location: [facility];  Service: Urology;  Laterality: N/A;   CYSTOSCOPY WITH LITHOLAPAXY  07/11/2011   Procedure: CYSTOSCOPY WITH LITHOLAPAXY;  Surgeon: Marissa Nestle, MD;  Location: [facility];  Service: Urology;  Laterality: N/A;   ESOPHAGOGASTRODUODENOSCOPY  07/03/2011   Procedure: ESOPHAGOGASTRODUODENOSCOPY (EGD);  Surgeon: Rogene Houston, MD;  [facility]: [facility];  Service: Endoscopy;  Laterality: N/A;   HERNIA REPAIR  [id number]    umbilical  INGUINAL HERNIA REPAIR Right 05/04/2017   Procedure: HERNIA REPAIR INGUINAL ADULT WITH MESH;  Surgeon: Aviva Signs, MD;  Location: [facility];  Service: General;  Laterality: Right;   PARTIAL COLECTOMY  07/04/2011   Procedure: PARTIAL COLECTOMY;  Surgeon: Jamesetta So, MD;  Location: [facility];  Service: General;  Laterality: N/A;   POLYPECTOMY  02/06/2021   Procedure: POLYPECTOMY;  Surgeon: Rogene Houston, MD;  [facility];  Service: Endoscopy;;   Prostage cancer      PROSTATE BIOPSY     ROBOT ASSISTED LAPAROSCOPIC RADICAL PROSTATECTOMY Bilateral 01/19/2015   Procedure: ROBOTIC ASSISTED LAPAROSCOPIC RADICAL PROSTATECTOMY AND BILATERAL PELVIC LYMPH NODE DISSECTION, LAPARASCOPIC  LYSIS OF ADHESIONS;  Surgeon: Ardis Hughs, MD;  Location: [facility];  Service: Urology;  Laterality: Bilateral;   TRANSURETHRAL RESECTION OF PROSTATE  07/11/2011   Procedure: TRANSURETHRAL RESECTION OF THE PROSTATE (TURP);  Surgeon: Marissa Nestle, MD;  Location: [facility];  Service: Urology;  Laterality: N/A;    SOCIAL HISTORY:  Social History   Socioeconomic History   Marital status: Married    Spouse name: Not on file   Number of children: Not on file   Years of education: Not on file   Highest education level: Not on file  Occupational History   Occupation: farmer    Comment: tobacco; cattle; poultry  Tobacco Use   Smoking status: Former    Packs/day: 2.00    Years: 25.00    Pack years: 50.00    Types: Cigarettes    Quit date: 06/02/1989    Years since quitting: 31.7   Smokeless tobacco: Never  Substance and Sexual Activity   Alcohol use: No   Drug use: No   Sexual activity: Not Currently  Other Topics Concern   Not on file  Social History Narrative   Not on file   Social Determinants of Health   Financial Resource Strain: Not on file  Food Insecurity: Not on file  Transportation Needs: Not on file  Physical Activity: Not on file  Stress: Not on file  Social Connections: Not on file  Intimate Partner Violence: Not on file    FAMILY HISTORY:  Family History  Problem Relation Age of Onset   Alzheimer's disease Mother    Alzheimer's disease Father    Aneurysm Brother        brain   Anuerysm Brother    Healthy Son    Healthy Son    Colon cancer Neg Hx     CURRENT MEDICATIONS:  Current Outpatient Medications  Medication Sig Dispense Refill   Cholecalciferol (VITAMIN D3) 125 MCG (5000 UT) TABS Take by mouth in the morning.      Cyanocobalamin (VITAMIN B-12) 5000 MCG SUBL Place 5,000 mcg under the tongue in the morning.     divalproex (DEPAKOTE) 250 MG DR tablet Take 250 mg by mouth in the morning and at bedtime.     GAVILYTE-G 236 g solution SMARTSIG:Milliliter(s) By Mouth     Lactobacillus (ACIDOPHILUS PO) Take 1 capsule by mouth in the morning. Now Pb8 Acidophilus     loratadine (CLARITIN) 10 MG tablet Take 10 mg by mouth in the morning.     memantine (NAMENDA) 5 MG tablet Take 5 mg by mouth in the morning and at bedtime.     Multiple Vitamin (MULTIVITAMIN WITH MINERALS) TABS tablet Take 1 tablet by mouth in the morning. Centrum Silver for Men 50+     naphazoline-pheniramine (ALLERGY EYE) 0.025-0.3 % ophthalmic solution  Place 1-2 drops into both eyes 4 (four) times daily as needed (eye irritation (due to dust/dirt exposure)).     Omega-3 Fatty Acids (FISH OIL) 1200 MG CAPS Take 1,200 mg by mouth in the morning.     pantoprazole (PROTONIX) 40 MG tablet TAKE 1 TABLET BY MOUTH ONCE DAILY. 90 tablet 3   No current facility-administered medications for this visit.    ALLERGIES:  No Known Allergies  PHYSICAL EXAM:  Performance status (ECOG): 1 - Symptomatic but completely ambulatory  Vitals:   02/19/21 1409  BP: 123/71  Pulse: 71  Resp: 20  Temp: 99 F (37.2 C)  SpO2: 94%   Wt Readings from Last 3 Encounters:  02/19/21 239 lb 10.2 oz (108.7 kg)  02/06/21 242 lb (109.8 kg)  01/31/21 242 lb (109.8 kg)   Physical Exam Vitals reviewed.  Constitutional:      Appearance: Normal appearance.  Cardiovascular:     Rate and Rhythm: Normal rate and regular rhythm.     Pulses: Normal pulses.     Heart sounds: Normal heart sounds.  Pulmonary:     Effort: Pulmonary effort is normal.     Breath sounds: Normal breath sounds.  Abdominal:     Palpations: Abdomen is soft. There is no hepatomegaly, splenomegaly or mass.     Tenderness: There is no abdominal tenderness.  Neurological:     General: No focal deficit  present.     Mental Status: He is alert and oriented to person, place, and time.  Psychiatric:        Mood and Affect: Mood normal.        Behavior: Behavior normal.     LABORATORY DATA:  I have reviewed the labs as listed.  CBC Latest Ref Rng & Units 02/12/2021 06/25/2020 02/07/2020  WBC 4.0 - 10.5 K/uL 6.4 8.8 6.2  Hemoglobin 13.0 - 17.0 g/dL 13.9 14.3 14.1  Hematocrit 39.0 - 52.0 % 42.7 42.9 42.4  Platelets 150 - 400 K/uL 247 255 259   CMP Latest Ref Rng & Units 02/12/2021 06/25/2020 02/07/2020  Glucose 70 - 99 mg/dL 99 134(H) 116(H)  BUN 8 - 23 mg/dL '11 16 12  '$ Creatinine 0.61 - 1.24 mg/dL 0.87 1.00 0.90  Sodium 135 - 145 mmol/L 142 136 139  Potassium 3.5 - 5.1 mmol/L 4.1 3.9 3.9  Chloride 98 - 111 mmol/L 104 102 104  CO2 22 - 32 mmol/L '29 25 26  '$ Calcium 8.9 - 10.3 mg/dL 9.0 8.8(L) 8.8(L)  Total Protein 6.5 - 8.1 g/dL 6.7 6.7 6.6  Total Bilirubin 0.3 - 1.2 mg/dL 0.7 0.6 1.3(H)  Alkaline Phos 38 - 126 U/L 54 61 53  AST 15 - 41 U/L 31 36 30  ALT 0 - 44 U/L '22 26 23    '$ DIAGNOSTIC IMAGING:  I have independently reviewed the scans and discussed with the patient. No results found.   ASSESSMENT:  1.  Cecal cancer: - Right hemicolectomy in February 2013 for a T3N0 disease.  No adjuvant chemotherapy was recommended. -Last CT scan of [facility] on 12/17/2016 showing stable pelvic nodes, which were negative on PET scan. -Last colonoscopy was on 12/05/2015 which showed normal ileum, 5 mm polyp in the transverse colon, small polyps in the rectosigmoid colon, diverticulosis of the sigmoid colon and external hemorrhoids.   2.  Prostate cancer: -Underwent radical prostatectomy on 01/19/2015 by Dr. Louis Meckel with [facility]. -Reportedly PSA has been staying undetectable since then.   3.  Left thyroid nodule: - Recent  CT scan showed thyroid nodules. - Ultrasound of the thyroid on 01/11/2019 showed 1.8 cm mid left nodule. - FNA biopsy on 01/20/2019 consistent with benign follicular nodule.   4.  Lung  nodule: - CT chest dated 12/21/2017 showed stable 4 mm right lower lobe lung nodule.  No further scans recommended as it has been staying stable since July 2018.    PLAN:  1.  Cecal cancer: - Denies any bleeding per rectum or change in bowel habits. - Reviewed colonoscopy from 02/06/2021.  No evidence of recurrence.  1 tubular adenoma was removed from splenic flexure. - Labs from today did not show any evidence of recurrence.  Normal LFTs and CBC.  CEA was 2.7. - He was given choice of following up with Dr. Nevada Crane.  He wanted to continue follow-ups with [place].  RTC 1 year with repeat labs.   2.  Prostate cancer: - Latest PSA is undetectable.   Orders placed this encounter:  No orders of the defined types were placed in this encounter.    Derek Jack, MD [facility] [phone]   I, Thana Ates, am acting as a scribe for Dr. Derek Jack.  I, Derek Jack MD, have reviewed the above documentation for accuracy and completeness, and I agree with the above.

## 2021-02-19 ENCOUNTER — Encounter (INDEPENDENT_AMBULATORY_CARE_PROVIDER_SITE_OTHER): Payer: Self-pay | Admitting: *Deleted

## 2021-02-19 ENCOUNTER — Encounter (HOSPITAL_COMMUNITY): Payer: Self-pay | Admitting: Hematology

## 2021-02-19 ENCOUNTER — Inpatient Hospital Stay (HOSPITAL_BASED_OUTPATIENT_CLINIC_OR_DEPARTMENT_OTHER): Payer: Medicare Other | Admitting: Hematology

## 2021-02-19 ENCOUNTER — Other Ambulatory Visit: Payer: Self-pay

## 2021-02-19 VITALS — BP 123/71 | HR 71 | Temp 99.0°F | Resp 20 | Wt 239.6 lb

## 2021-02-19 DIAGNOSIS — C61 Malignant neoplasm of prostate: Secondary | ICD-10-CM

## 2021-02-19 DIAGNOSIS — Z9049 Acquired absence of other specified parts of digestive tract: Secondary | ICD-10-CM | POA: Diagnosis not present

## 2021-02-19 DIAGNOSIS — Z85038 Personal history of other malignant neoplasm of large intestine: Secondary | ICD-10-CM | POA: Diagnosis not present

## 2021-02-19 DIAGNOSIS — C189 Malignant neoplasm of colon, unspecified: Secondary | ICD-10-CM | POA: Diagnosis not present

## 2021-02-19 DIAGNOSIS — Z8546 Personal history of malignant neoplasm of prostate: Secondary | ICD-10-CM | POA: Diagnosis not present

## 2021-02-19 DIAGNOSIS — E041 Nontoxic single thyroid nodule: Secondary | ICD-10-CM | POA: Diagnosis not present

## 2021-02-19 DIAGNOSIS — R918 Other nonspecific abnormal finding of lung field: Secondary | ICD-10-CM | POA: Diagnosis not present

## 2021-02-19 NOTE — Patient Instructions (Addendum)
[facility] Discharge Instructions  You were seen today by Dr. Katragadda. He went over your recent results. Dr. Katragadda will see you back in 1 year for labs and follow up.   Thank you for choosing [facility] at [facility] to provide your oncology and hematology care.  To afford each patient quality time with our provider, please arrive at least 15 minutes before your scheduled appointment time.   If you have a lab appointment with the Cancer Center please come in thru the Main Entrance and check in at the main information desk  You need to re-schedule your appointment should you arrive 10 or more minutes late.  We strive to give you quality time with our providers, and arriving late affects you and other patients whose appointments are after yours.  Also, if you no show three or more times for appointments you may be dismissed from the clinic at the providers discretion.     Again, thank you for choosing [facility].  Our hope is that these requests will decrease the amount of time that you wait before being seen by our physicians.       _____________________________________________________________  Should you have questions after your visit to [facility], please contact our office at [phone] between the hours of 8:00 a.m. and 4:30 p.m.  Voicemails left after 4:00 p.m. will not be returned until the following business day.  For prescription refill requests, have your pharmacy contact our office and allow 72 hours.    Cancer Center Support Programs:   > Cancer Support Group  2nd Tuesday of the month 1pm-2pm, Journey Room    

## 2021-03-05 ENCOUNTER — Telehealth (INDEPENDENT_AMBULATORY_CARE_PROVIDER_SITE_OTHER): Payer: Self-pay | Admitting: Internal Medicine

## 2021-03-05 ENCOUNTER — Telehealth (INDEPENDENT_AMBULATORY_CARE_PROVIDER_SITE_OTHER): Payer: Self-pay | Admitting: *Deleted

## 2021-03-05 MED ORDER — HYOSCYAMINE SULFATE 0.125 MG SL SUBL: 0.1250 mg | SUBLINGUAL_TABLET | Freq: Three times a day (TID) | SUBLINGUAL | 0 refills | Status: DC | PRN

## 2021-03-05 NOTE — Telephone Encounter (Signed)
Patient's call returned. He states he has been having mild migratory pain in his lower abdomen since his colonoscopy.  He has not had fever chills nausea vomiting or change in bowel habits.  He is also not having dysuria or hematuria. Patient only had couple of small polyps removed with cold snare. Nausea rest of the cause of this pain.  We will try him on antispasmodic and if it does not work will plan to see him in the office. Will send prescription for hyoscyamine sublingual.  Patient advised to take it before each meal for 5 to 7 days and thereafter on as-needed basis if it works. Patient advised to call office with progress for next week.

## 2021-03-05 NOTE — Telephone Encounter (Signed)
Wife sherry called states since having colonoscopy he is having pain on right upper side when sitting or twisting. When he coughs he states his lower mid abdomen  hurts. Discussed with dr Laural Golden and he states he will call pt.

## 2021-03-13 DIAGNOSIS — H903 Sensorineural hearing loss, bilateral: Secondary | ICD-10-CM | POA: Diagnosis not present

## 2021-03-13 DIAGNOSIS — H6123 Impacted cerumen, bilateral: Secondary | ICD-10-CM | POA: Diagnosis not present

## 2021-03-13 DIAGNOSIS — H838X3 Other specified diseases of inner ear, bilateral: Secondary | ICD-10-CM | POA: Diagnosis not present

## 2021-04-02 NOTE — Telephone Encounter (Signed)
error 

## 2021-05-01 ENCOUNTER — Ambulatory Visit: Payer: Medicare Other | Admitting: Neurology

## 2021-05-30 ENCOUNTER — Ambulatory Visit: Payer: Medicare Other | Admitting: Neurology

## 2021-06-18 DIAGNOSIS — X32XXXD Exposure to sunlight, subsequent encounter: Secondary | ICD-10-CM | POA: Diagnosis not present

## 2021-06-18 DIAGNOSIS — L570 Actinic keratosis: Secondary | ICD-10-CM | POA: Diagnosis not present

## 2021-06-18 DIAGNOSIS — B078 Other viral warts: Secondary | ICD-10-CM | POA: Diagnosis not present

## 2021-06-27 ENCOUNTER — Ambulatory Visit (INDEPENDENT_AMBULATORY_CARE_PROVIDER_SITE_OTHER): Payer: Medicare Other | Admitting: Neurology

## 2021-06-27 ENCOUNTER — Encounter: Payer: Self-pay | Admitting: Neurology

## 2021-06-27 VITALS — BP 151/75 | HR 128 | Ht 75.0 in | Wt 226.0 lb

## 2021-06-27 DIAGNOSIS — G3184 Mild cognitive impairment, so stated: Secondary | ICD-10-CM

## 2021-06-27 DIAGNOSIS — G40909 Epilepsy, unspecified, not intractable, without status epilepticus: Secondary | ICD-10-CM

## 2021-06-27 MED ORDER — TIZANIDINE HCL 4 MG PO TABS
4.0000 mg | ORAL_TABLET | Freq: Three times a day (TID) | ORAL | 0 refills | Status: DC | PRN
Start: 2021-06-27 — End: 2022-06-25

## 2021-06-27 MED ORDER — MEMANTINE HCL 10 MG PO TABS: 10.0000 mg | ORAL_TABLET | Freq: Two times a day (BID) | ORAL | 11 refills | Status: DC

## 2021-06-27 NOTE — Patient Instructions (Signed)
Continue Depakote 250 mg twice daily  Will obtain a depakote level and CMP today  Routine EEG  Increase Namenda to 10 mg twice daily  Return in 1 year

## 2021-06-27 NOTE — Progress Notes (Signed)
[facility]  PATIENT: William Burns DOB: 08/22/1945  REQUESTING CLINICIAN: Celene Squibb, MD HISTORY FROM: Patient and spouse  REASON FOR VISIT: Hx of seizure and worsening memory    HISTORICAL  CHIEF COMPLAINT:  Chief Complaint  Patient presents with   New Patient (Initial Visit)    RM 75 with wife here for consult on seizure hx and decline in short term memory- Reports he had a seizure 1 year ago (was seeing neurology in [place], but never received feed back on testing done).     HISTORY OF PRESENT ILLNESS:  This is a 76 year old gentleman with past medical history of colon cancer, BPH, seizures and worsening memory who is presenting to establish care.  Patient reported he had his first lifetime seizure on June 25, 2020.  Per wife he was around 12:45 at night when she noted patient starts shaking on the bed, all body shaking, she could not stop it.  EMS was called and patient was taken to the ED.  He was noted to have postictal confusion.  No reported tongue biting or urinary incontinence.  He was back to his normal self and discharged home with neurology follow-up.  Patient reported he follow-up with a neurologist, had a EEG, was told that the EEG was abnormal (showed seizures) and he was started on antiseizure medication. He was started on Depakote 250 mg twice daily.  Since then he has been compliant with the Depakote, denies any additional seizures but has not followed up with neurology for the past year.  In terms of his memory problem, he describes issues with recognizing people face, trouble with remembering people's names,  wife reports that he will asked the same questions over and over and sometimes repeat himself too.  Wife also has to remind him of events.  Other than that, he is still very active, he working in his farm, he still drives, denies any accident, denies being lost in familiar places.  He is currently on Namenda 5 mg twice daily. Wife  handles all finances, she has been doing it for many years.   OTHER MEDICAL CONDITIONS: Colon cancer, BPH, seizure disorder, memory problem,   REVIEW OF SYSTEMS: Full 14 system review of systems performed and negative with exception of: As noted in HPI  ALLERGIES: No Known Allergies  HOME MEDICATIONS: Outpatient Medications Prior to Visit  Medication Sig Dispense Refill   Cholecalciferol (VITAMIN D3) 125 MCG (5000 UT) TABS Take by mouth in the morning.     Cyanocobalamin (VITAMIN B-12) 5000 MCG SUBL Place 5,000 mcg under the tongue in the morning.     divalproex (DEPAKOTE) 250 MG DR tablet Take 250 mg by mouth in the morning and at bedtime.     Lactobacillus (ACIDOPHILUS PO) Take 1 capsule by mouth in the morning. Now Pb8 Acidophilus     loratadine (CLARITIN) 10 MG tablet Take 10 mg by mouth in the morning.     Multiple Vitamin (MULTIVITAMIN WITH MINERALS) TABS tablet Take 1 tablet by mouth in the morning. Centrum Silver for Men 50+     Omega-3 Fatty Acids (FISH OIL) 1200 MG CAPS Take 1,200 mg by mouth in the morning.     pantoprazole (PROTONIX) 40 MG tablet TAKE 1 TABLET BY MOUTH ONCE DAILY. 90 tablet 3   memantine (NAMENDA) 5 MG tablet Take 5 mg by mouth in the morning and at bedtime.     GAVILYTE-G 236 g solution SMARTSIG:Milliliter(s) By Mouth (Patient not taking: Reported on  06/27/2021)     hyoscyamine (LEVSIN SL) 0.125 MG SL tablet Place 1 tablet (0.125 mg total) under the tongue 3 (three) times daily as needed. (Patient not taking: Reported on 06/27/2021) 45 tablet 0   naphazoline-pheniramine (ALLERGY EYE) 0.025-0.3 % ophthalmic solution Place 1-2 drops into both eyes 4 (four) times daily as needed (eye irritation (due to dust/dirt exposure)). (Patient not taking: Reported on 06/27/2021)     No facility-administered medications prior to visit.    PAST MEDICAL HISTORY: Past Medical History:  Diagnosis Date   BPH (benign prostatic hyperplasia)    Cancer ([place])    colon   Cataract     Colonic mass 07/03/2011   Difficult intubation    extra long tube for intub per wife; states "his neck is longer than regular people" and slow to wake up    Erosive gastritis 07/03/2011   GERD (gastroesophageal reflux disease)    History of kidney stones    Kidney stones 2011   Renal calculus    Tooth infection 07/03/2011    PAST SURGICAL HISTORY: Past Surgical History:  Procedure Laterality Date   CATARACT EXTRACTION W/PHACO Left 08/23/2012   Procedure: CATARACT EXTRACTION PHACO AND INTRAOCULAR LENS PLACEMENT (Export);  Surgeon: Tonny Branch, MD;  Location: [facility];  Service: Ophthalmology;  Laterality: Left;  CDE 16.64   CATARACT EXTRACTION W/PHACO Right 06/09/2013   Procedure: CATARACT EXTRACTION PHACO AND INTRAOCULAR LENS PLACEMENT (IOC) CDE=10.86;  Surgeon: Tonny Branch, MD;  Location: [facility];  Service: Ophthalmology;  Laterality: Right;   COLON SURGERY     COLONOSCOPY  07/03/2011   Procedure: COLONOSCOPY;  Surgeon: Rogene Houston, MD;  [facility];  Service: Endoscopy;  Laterality: N/A;   COLONOSCOPY N/A 09/16/2012   Procedure: COLONOSCOPY;  Surgeon: Rogene Houston, MD;  [facility];  Service: Endoscopy;  Laterality: N/A;  930   COLONOSCOPY N/A 12/05/2015   Procedure: COLONOSCOPY;  Surgeon: Rogene Houston, MD;  [facility];  Service: Endoscopy;  Laterality: N/A;  1200   COLONOSCOPY WITH PROPOFOL N/A 02/06/2021   Procedure: COLONOSCOPY WITH PROPOFOL;  Surgeon: Rogene Houston, MD;  [facility];  Service: Endoscopy;  Laterality: N/A;  12:50   CYSTOSCOPY  07/09/2011   Procedure: CYSTOSCOPY FLEXIBLE;  Surgeon: Marissa Nestle, MD;  Location: [facility];  Service: Urology;  Laterality: N/A;   CYSTOSCOPY WITH LITHOLAPAXY  07/11/2011   Procedure: CYSTOSCOPY WITH LITHOLAPAXY;  Surgeon: Marissa Nestle, MD;  Location: [facility];  Service: Urology;  Laterality: N/A;   ESOPHAGOGASTRODUODENOSCOPY  07/03/2011   Procedure: ESOPHAGOGASTRODUODENOSCOPY (EGD);   Surgeon: Rogene Houston, MD;  [facility];  Service: Endoscopy;  Laterality: N/A;   HERNIA REPAIR  1610    umbilical    INGUINAL HERNIA REPAIR Right 05/04/2017   Procedure: HERNIA REPAIR INGUINAL ADULT WITH MESH;  Surgeon: Aviva Signs, MD;  Location: [facility];  Service: General;  Laterality: Right;   PARTIAL COLECTOMY  07/04/2011   Procedure: PARTIAL COLECTOMY;  Surgeon: Jamesetta So, MD;  Location: [facility];  Service: General;  Laterality: N/A;   POLYPECTOMY  02/06/2021   Procedure: POLYPECTOMY;  Surgeon: Rogene Houston, MD;  [facility];  Service: Endoscopy;;   Prostage cancer     PROSTATE BIOPSY     ROBOT ASSISTED LAPAROSCOPIC RADICAL PROSTATECTOMY Bilateral 01/19/2015   Procedure: ROBOTIC ASSISTED LAPAROSCOPIC RADICAL PROSTATECTOMY AND BILATERAL PELVIC LYMPH NODE DISSECTION, LAPARASCOPIC  LYSIS OF ADHESIONS;  Surgeon: Viona Gilmore  Louis Meckel, MD;  Location: [facility];  Service: Urology;  Laterality: Bilateral;   TRANSURETHRAL RESECTION OF PROSTATE  07/11/2011   Procedure: TRANSURETHRAL RESECTION OF THE PROSTATE (TURP);  Surgeon: Marissa Nestle, MD;  Location: [facility];  Service: Urology;  Laterality: N/A;    FAMILY HISTORY: Family History  Problem Relation Age of Onset   Alzheimer's disease Mother    Alzheimer's disease Father    Aneurysm Brother        brain   Anuerysm Brother    Healthy Son    Healthy Son    Colon cancer Neg Hx     SOCIAL HISTORY: Social History   Socioeconomic History   Marital status: Married    Spouse name: Not on file   Number of children: Not on file   Years of education: Not on file   Highest education level: Not on file  Occupational History   Occupation: farmer    Comment: tobacco; cattle; poultry  Tobacco Use   Smoking status: Former    Packs/day: 2.00    Years: 25.00    Pack years: 50.00    Types: Cigarettes    Quit date: 06/02/1989    Years since quitting: 32.0   Smokeless tobacco: Never  Substance and Sexual Activity    Alcohol use: No   Drug use: No   Sexual activity: Not Currently  Other Topics Concern   Not on file  Social History Narrative   Right handed    Caffeine- none    Lives at home with wife Judeen Hammans    Social Determinants of Health   Financial Resource Strain: Not on file  Food Insecurity: Not on file  Transportation Needs: Not on file  Physical Activity: Not on file  Stress: Not on file  Social Connections: Not on file  Intimate Partner Violence: Not on file    PHYSICAL EXAM  GENERAL EXAM/CONSTITUTIONAL: Vitals:  Vitals:   06/27/21 1326  BP: (!) 151/75  Pulse: (!) 128  Weight: 226 lb (102.5 kg)  Height: 6\' 3"  (1.905 m)   Body mass index is 28.25 kg/m. Wt Readings from Last 3 Encounters:  06/27/21 226 lb (102.5 kg)  02/19/21 239 lb 10.2 oz (108.7 kg)  02/06/21 242 lb (109.8 kg)   Patient is in no distress; well developed, nourished and groomed; neck is supple  CARDIOVASCULAR: Examination of carotid arteries is normal; no carotid bruits Regular rate and rhythm, no murmurs Examination of peripheral vascular system by observation and palpation is normal  EYES: Pupils round and reactive to light, Visual fields full to confrontation, Extraocular movements intacts,   MUSCULOSKELETAL: Gait, strength, tone, movements noted in Neurologic exam below  NEUROLOGIC: MENTAL STATUS:  No flowsheet data found. awake, alert, oriented to person, place and time recent and remote memory intact normal attention and concentration language fluent, comprehension intact, naming intact fund of knowledge appropriate  CRANIAL NERVE:  2nd, 3rd, 4th, 6th - pupils equal and reactive to light, visual fields full to confrontation, extraocular muscles intact, no nystagmus 5th - facial sensation symmetric 7th - facial strength symmetric 8th - hearing intact 9th - palate elevates symmetrically, uvula midline 11th - shoulder shrug symmetric 12th - tongue protrusion midline  MOTOR:  normal  bulk and tone, full strength in the BUE, BLE  SENSORY:  normal and symmetric to light touch, pinprick, temperature, vibration  COORDINATION:  finger-nose-finger, fine finger movements normal  REFLEXES:  deep tendon reflexes present and symmetric  GAIT/STATION:  normal  DIAGNOSTIC DATA (LABS, IMAGING, TESTING) - I reviewed patient records, labs, notes, testing and imaging myself where available.  Lab Results  Component Value Date   WBC 6.4 02/12/2021   HGB 13.9 02/12/2021   HCT 42.7 02/12/2021   MCV 93.6 02/12/2021   PLT 247 02/12/2021      Component Value Date/Time   NA 142 02/12/2021 1115   K 4.1 02/12/2021 1115   CL 104 02/12/2021 1115   CO2 29 02/12/2021 1115   GLUCOSE 99 02/12/2021 1115   BUN 11 02/12/2021 1115   CREATININE 0.87 02/12/2021 1115   CALCIUM 9.0 02/12/2021 1115   PROT 6.7 02/12/2021 1115   ALBUMIN 3.8 02/12/2021 1115   AST 31 02/12/2021 1115   ALT 22 02/12/2021 1115   ALKPHOS 54 02/12/2021 1115   BILITOT 0.7 02/12/2021 1115   GFRNONAA >60 02/12/2021 1115   GFRAA >60 02/07/2020 1123   Lab Results  Component Value Date   CHOL 167 01/26/2015   HDL 26 (L) 01/26/2015   LDLCALC 111 (H) 01/26/2015   TRIG 151 (H) 01/26/2015   CHOLHDL 6.4 01/26/2015   Lab Results  Component Value Date   HGBA1C 5.5 01/26/2015   Lab Results  Component Value Date   VITAMINB12 268 07/01/2011   Lab Results  Component Value Date   TSH 2.609 01/25/2015    Head CT 06/25/2020 1. No acute intracranial abnormalities. 2. Chronic atrophy and small vessel ischemic changes.   ASSESSMENT AND PLAN  76 y.o. year old male with history of colon cancer, BPH, seizure disorder and medical problem who is presenting to establish care.  For his seizure disorder he had a first lifetime seizure in January 2022, had a EEG and was told it was normal and started on Depakote 250 mg twice daily.  Since then he has not had any additional seizures.  I will continue Depakote 250 mg  twice daily and obtain a routine EEG. In terms of his memory he is already on Namenda 5 mg twice daily but there is complaint of forgetfulness, cannot remember faces cannot remember people's name.  There is also report of patient repeating himself or wife repeating the same thing over and over.  His Mini-Mental status exam score was 23 out of 30.  Since he is already on Namenda 5 mg twice daily, I will increase it to 10 mg twice daily.  I will see him in 1 year for follow-up. 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  I will give him a short course of muscle relaxant.  Advised patient to not work in his farm while taking the muscle relaxant as there are side effects of drowsiness.  He voices understanding   1. Seizure disorder ([place])   2. Mild cognitive impairment      Patient Instructions  Continue Depakote 250 mg twice daily  Will obtain a depakote level and CMP today  Routine EEG  Increase Namenda to 10 mg twice daily  Return in 1 year   Orders Placed This Encounter  Procedures   Valproic Acid Level   CMP   EEG adult    Meds ordered this encounter  Medications   memantine (NAMENDA) 10 MG tablet    Sig: Take 1 tablet (10 mg total) by mouth 2 (two) times daily.    Dispense:  60 tablet    Refill:  11   tiZANidine (ZANAFLEX) 4 MG tablet  Sig: Take 1 tablet (4 mg total) by mouth every 8 (eight) hours as needed for muscle spasms.    Dispense:  15 tablet    Refill:  0    Return in about 1 year (around 06/27/2022).    Alric Ran, MD 06/27/2021, 3:49 PM  [facility] [address], [place], [place] [address] [phone]

## 2021-06-28 LAB — COMPREHENSIVE METABOLIC PANEL
ALT: 14 IU/L (ref 0–44)
AST: 22 IU/L (ref 0–40)
Albumin/Globulin Ratio: 1.6 (ref 1.2–2.2)
Albumin: 4.1 g/dL (ref 3.7–4.7)
Alkaline Phosphatase: 79 IU/L (ref 44–121)
BUN/Creatinine Ratio: 15 (ref 10–24)
BUN: 12 mg/dL (ref 8–27)
Bilirubin Total: 0.5 mg/dL (ref 0.0–1.2)
CO2: 26 mmol/L (ref 20–29)
Calcium: 9.4 mg/dL (ref 8.6–10.2)
Chloride: 100 mmol/L (ref 96–106)
Creatinine, Ser: 0.78 mg/dL (ref 0.76–1.27)
Globulin, Total: 2.5 g/dL (ref 1.5–4.5)
Glucose: 92 mg/dL (ref 70–99)
Potassium: 4.9 mmol/L (ref 3.5–5.2)
Sodium: 140 mmol/L (ref 134–144)
Total Protein: 6.6 g/dL (ref 6.0–8.5)
eGFR: 92 mL/min/{1.73_m2} (ref >59)

## 2021-06-28 LAB — VALPROIC ACID LEVEL: Valproic Acid Lvl: 29 ug/mL — ABNORMAL LOW (ref 50–100)

## 2021-07-04 ENCOUNTER — Other Ambulatory Visit: Payer: Self-pay

## 2021-07-04 ENCOUNTER — Ambulatory Visit (INDEPENDENT_AMBULATORY_CARE_PROVIDER_SITE_OTHER): Payer: Medicare Other | Admitting: Neurology

## 2021-07-04 DIAGNOSIS — G40909 Epilepsy, unspecified, not intractable, without status epilepticus: Secondary | ICD-10-CM

## 2021-07-04 NOTE — Procedures (Signed)
° ° °  History:  76 year old man with seizure disorder   EEG classification: Awake and drowsy  Description of the recording: The background rhythms of this recording consists of a fairly well modulated medium amplitude alpha rhythm of 9 Hz that is reactive to eye opening and closure. As the record progresses, the patient appears to remain in the waking state throughout the recording. Photic stimulation was performed, did not show any abnormalities. Hyperventilation was also performed, did not show any abnormalities. Toward the end of the recording, the patient enters the drowsy state with slight symmetric slowing seen. The patient never enters stage II sleep. No abnormal epileptiform discharges seen during this recording. There was no focal slowing. EKG monitor shows no evidence of cardiac rhythm abnormalities with a heart rate of 60.  Impression: This is a normal EEG recording in the waking and drowsy state. No evidence of interictal epileptiform discharges seen. A normal EEG does not exclude a diagnosis of epilepsy.    William Ran, MD [facility]

## 2021-07-30 DIAGNOSIS — E782 Mixed hyperlipidemia: Secondary | ICD-10-CM | POA: Diagnosis not present

## 2021-07-30 DIAGNOSIS — R7303 Prediabetes: Secondary | ICD-10-CM | POA: Diagnosis not present

## 2021-08-01 DIAGNOSIS — G40909 Epilepsy, unspecified, not intractable, without status epilepticus: Secondary | ICD-10-CM | POA: Diagnosis not present

## 2021-08-01 DIAGNOSIS — H9193 Unspecified hearing loss, bilateral: Secondary | ICD-10-CM | POA: Diagnosis not present

## 2021-08-01 DIAGNOSIS — R7303 Prediabetes: Secondary | ICD-10-CM | POA: Diagnosis not present

## 2021-08-01 DIAGNOSIS — E669 Obesity, unspecified: Secondary | ICD-10-CM | POA: Diagnosis not present

## 2021-08-01 DIAGNOSIS — R194 Change in bowel habit: Secondary | ICD-10-CM | POA: Diagnosis not present

## 2021-08-01 DIAGNOSIS — E538 Deficiency of other specified B group vitamins: Secondary | ICD-10-CM | POA: Diagnosis not present

## 2021-08-01 DIAGNOSIS — K219 Gastro-esophageal reflux disease without esophagitis: Secondary | ICD-10-CM | POA: Diagnosis not present

## 2021-08-01 DIAGNOSIS — C61 Malignant neoplasm of prostate: Secondary | ICD-10-CM | POA: Diagnosis not present

## 2021-08-01 DIAGNOSIS — R4189 Other symptoms and signs involving cognitive functions and awareness: Secondary | ICD-10-CM | POA: Diagnosis not present

## 2021-08-01 DIAGNOSIS — E782 Mixed hyperlipidemia: Secondary | ICD-10-CM | POA: Diagnosis not present

## 2021-08-01 DIAGNOSIS — N1831 Chronic kidney disease, stage 3a: Secondary | ICD-10-CM | POA: Diagnosis not present

## 2021-08-01 DIAGNOSIS — H8113 Benign paroxysmal vertigo, bilateral: Secondary | ICD-10-CM | POA: Diagnosis not present

## 2021-08-02 DIAGNOSIS — Z20822 Contact with and (suspected) exposure to covid-19: Secondary | ICD-10-CM | POA: Diagnosis not present

## 2021-08-21 DIAGNOSIS — Z20822 Contact with and (suspected) exposure to covid-19: Secondary | ICD-10-CM | POA: Diagnosis not present

## 2021-09-11 DIAGNOSIS — H903 Sensorineural hearing loss, bilateral: Secondary | ICD-10-CM | POA: Diagnosis not present

## 2021-09-11 DIAGNOSIS — H838X3 Other specified diseases of inner ear, bilateral: Secondary | ICD-10-CM | POA: Diagnosis not present

## 2021-09-16 DIAGNOSIS — Z20822 Contact with and (suspected) exposure to covid-19: Secondary | ICD-10-CM | POA: Diagnosis not present

## 2021-09-18 DIAGNOSIS — Z20822 Contact with and (suspected) exposure to covid-19: Secondary | ICD-10-CM | POA: Diagnosis not present

## 2021-09-19 DIAGNOSIS — Z20822 Contact with and (suspected) exposure to covid-19: Secondary | ICD-10-CM | POA: Diagnosis not present

## 2021-09-28 DIAGNOSIS — Z20822 Contact with and (suspected) exposure to covid-19: Secondary | ICD-10-CM | POA: Diagnosis not present

## 2021-09-30 DIAGNOSIS — H00025 Hordeolum internum left lower eyelid: Secondary | ICD-10-CM | POA: Diagnosis not present

## 2021-10-02 DIAGNOSIS — Z20822 Contact with and (suspected) exposure to covid-19: Secondary | ICD-10-CM | POA: Diagnosis not present

## 2021-10-05 DIAGNOSIS — Z20822 Contact with and (suspected) exposure to covid-19: Secondary | ICD-10-CM | POA: Diagnosis not present

## 2021-10-07 DIAGNOSIS — Z20822 Contact with and (suspected) exposure to covid-19: Secondary | ICD-10-CM | POA: Diagnosis not present

## 2021-12-17 DIAGNOSIS — L570 Actinic keratosis: Secondary | ICD-10-CM | POA: Diagnosis not present

## 2021-12-17 DIAGNOSIS — X32XXXD Exposure to sunlight, subsequent encounter: Secondary | ICD-10-CM | POA: Diagnosis not present

## 2022-01-01 ENCOUNTER — Ambulatory Visit: Payer: Self-pay | Admitting: *Deleted

## 2022-01-01 ENCOUNTER — Encounter: Payer: Self-pay | Admitting: *Deleted

## 2022-01-01 NOTE — Patient Instructions (Signed)
Visit Information  Thank you for taking time to visit with me today. Please don't hesitate to contact me if I can be of assistance to you.   Please call the care guide team at [phone] if you need to cancel or reschedule your appointment.   If you are experiencing a Mental Health or Behavioral Health Crisis or need someone to talk to, please call the Suicide and Crisis Lifeline: 988 call the [place] National Suicide Prevention Lifeline: 1-800-273-8255 or TTY: 1-800-799-4 TTY (1-800-799-4889) to talk to a trained counselor call 1-800-273-TALK (toll free, 24 hour hotline) go to [facility] [address], [place] ([phone]) call the [place] Crisis Line: 800-939-9988 call 911  Patient verbalizes understanding of instructions and care plan provided today and agrees to view in MyChart. Active MyChart status and patient understanding of how to access instructions and care plan via MyChart confirmed with patient.     No further follow up required.  Brodrick Curran, BSW, MSW, LCSW  Licensed Clinical Social Worker  [facility]  Mailing Address-[address], [place], [place] [address] Physical Address-[address], [place], [place] [address] Toll Free Main # [phone] Fax # [phone] Cell # [phone] [email or web address]            

## 2022-01-01 NOTE — Patient Outreach (Signed)
  Care Coordination   Initial Visit Note   01/01/2022 Name: SEON GAERTNER MRN: [id number] DOB: 1945/09/05  [name] is a 76 y.o. year old male who sees Nevada Crane, Edwinna Areola, MD for primary care. I spoke with  [name] by phone today.  What matters to the patients health and wellness today?  No Intervention Identified.   Goals Addressed   None     SDOH assessments and interventions completed:  Yes  SDOH Interventions Today    Flowsheet Row Most Recent Value  SDOH Interventions   Food Insecurity Interventions Intervention Not Indicated  Financial Strain Interventions Intervention Not Indicated  Housing Interventions Intervention Not Indicated  Physical Activity Interventions Patient Refused  Stress Interventions Intervention Not Indicated  Social Connections Interventions Intervention Not Indicated  Transportation Interventions Intervention Not Indicated        Care Coordination Interventions Activated:  No   Care Coordination Interventions:  No, not indicated.   Follow up plan: No further intervention required.   Encounter Outcome:  Pt. Visit Completed.   Nat Christen, BSW, MSW, LCSW  Licensed Education officer, [facility]  Mailing [place] [address] [place], [place] [address] Physical Address-[address], [place], [place] [address] Toll Free Main # [phone] Fax # [phone] Cell # [phone] [email or web address]

## 2022-01-09 DIAGNOSIS — H02132 Senile ectropion of right lower eyelid: Secondary | ICD-10-CM | POA: Diagnosis not present

## 2022-01-28 DIAGNOSIS — E782 Mixed hyperlipidemia: Secondary | ICD-10-CM | POA: Diagnosis not present

## 2022-01-28 DIAGNOSIS — E538 Deficiency of other specified B group vitamins: Secondary | ICD-10-CM | POA: Diagnosis not present

## 2022-01-28 DIAGNOSIS — R7303 Prediabetes: Secondary | ICD-10-CM | POA: Diagnosis not present

## 2022-02-04 DIAGNOSIS — Z6830 Body mass index (BMI) 30.0-30.9, adult: Secondary | ICD-10-CM | POA: Diagnosis not present

## 2022-02-04 DIAGNOSIS — H9193 Unspecified hearing loss, bilateral: Secondary | ICD-10-CM | POA: Diagnosis not present

## 2022-02-04 DIAGNOSIS — E669 Obesity, unspecified: Secondary | ICD-10-CM | POA: Diagnosis not present

## 2022-02-04 DIAGNOSIS — E782 Mixed hyperlipidemia: Secondary | ICD-10-CM | POA: Diagnosis not present

## 2022-02-04 DIAGNOSIS — R194 Change in bowel habit: Secondary | ICD-10-CM | POA: Diagnosis not present

## 2022-02-04 DIAGNOSIS — H8113 Benign paroxysmal vertigo, bilateral: Secondary | ICD-10-CM | POA: Diagnosis not present

## 2022-02-04 DIAGNOSIS — E538 Deficiency of other specified B group vitamins: Secondary | ICD-10-CM | POA: Diagnosis not present

## 2022-02-04 DIAGNOSIS — N1831 Chronic kidney disease, stage 3a: Secondary | ICD-10-CM | POA: Diagnosis not present

## 2022-02-04 DIAGNOSIS — Z23 Encounter for immunization: Secondary | ICD-10-CM | POA: Diagnosis not present

## 2022-02-04 DIAGNOSIS — G40909 Epilepsy, unspecified, not intractable, without status epilepticus: Secondary | ICD-10-CM | POA: Diagnosis not present

## 2022-02-04 DIAGNOSIS — K219 Gastro-esophageal reflux disease without esophagitis: Secondary | ICD-10-CM | POA: Diagnosis not present

## 2022-02-04 DIAGNOSIS — R7303 Prediabetes: Secondary | ICD-10-CM | POA: Diagnosis not present

## 2022-02-06 DIAGNOSIS — H04203 Unspecified epiphora, bilateral lacrimal glands: Secondary | ICD-10-CM | POA: Diagnosis not present

## 2022-02-06 DIAGNOSIS — H02132 Senile ectropion of right lower eyelid: Secondary | ICD-10-CM | POA: Diagnosis not present

## 2022-02-06 DIAGNOSIS — H02135 Senile ectropion of left lower eyelid: Secondary | ICD-10-CM | POA: Diagnosis not present

## 2022-02-13 ENCOUNTER — Encounter: Payer: Self-pay | Admitting: Podiatrist

## 2022-02-13 ENCOUNTER — Ambulatory Visit (INDEPENDENT_AMBULATORY_CARE_PROVIDER_SITE_OTHER): Payer: Medicare Other

## 2022-02-13 ENCOUNTER — Ambulatory Visit (INDEPENDENT_AMBULATORY_CARE_PROVIDER_SITE_OTHER): Payer: Medicare Other | Admitting: Podiatrist

## 2022-02-13 DIAGNOSIS — M2041 Other hammer toe(s) (acquired), right foot: Secondary | ICD-10-CM

## 2022-02-13 DIAGNOSIS — G629 Polyneuropathy, unspecified: Secondary | ICD-10-CM

## 2022-02-13 DIAGNOSIS — M2042 Other hammer toe(s) (acquired), left foot: Secondary | ICD-10-CM

## 2022-02-13 MED ORDER — GABAPENTIN 100 MG PO CAPS: 100.0000 mg | ORAL_CAPSULE | Freq: Three times a day (TID) | ORAL | 3 refills | Status: DC

## 2022-02-13 NOTE — Progress Notes (Unsigned)
Chief Complaint  Patient presents with   Foot Pain    Bilateral hammertoes, started 20+ years ago, just started hurting more 5 mths ago, has trouble walking, rate of pain 4 out of 10 while walking, feels sharp pins and needles,  X-Rays taken today      HPI: Patient is 76 y.o. male who presents today for concerns as listed above.  He relates he has developed numbness and tingling type pain on the plantar aspect of bilateral feet especially the forefoot region that he noticed about 5 months ago.  He denies any trauma or injury to his feet or back.  Relates his only foot problem in the past has been hammertoes.  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   Prostate cancer ([place]) 08/29/2014   Kidney stone on left side 08/02/2013   Diarrhea 07/05/2013   Short-segment Barrett's esophagus 02/09/2012   Calculus, bladder 07/04/2011   Tooth infection 07/03/2011   Adenocarcinoma of colon ([place]) 07/03/2011   Erosive gastritis 07/03/2011   Microcytic hypochromic anemia 07/02/2011   Hx of colonic polyp 07/02/2011   Hyperglycemia 07/02/2011   BPH (benign prostatic hyperplasia) 07/02/2011    Current Outpatient Medications on File Prior to Visit  Medication Sig Dispense Refill   Cholecalciferol (VITAMIN D3) 125 MCG (5000 UT) TABS Take by mouth in the morning.     Cyanocobalamin (VITAMIN B-12) 5000 MCG SUBL Place 5,000 mcg under the tongue in the morning.     divalproex (DEPAKOTE) 250 MG DR tablet Take 250 mg by mouth in the morning and at bedtime.     Lactobacillus (ACIDOPHILUS PO) Take 1 capsule by mouth in the morning. Now Pb8 Acidophilus     loratadine (CLARITIN) 10 MG tablet Take 10 mg by mouth in the morning.     memantine (NAMENDA) 10 MG tablet Take 1  tablet (10 mg total) by mouth 2 (two) times daily. 60 tablet 11   Multiple Vitamin (MULTIVITAMIN WITH MINERALS) TABS tablet Take 1 tablet by mouth in the morning. Centrum Silver for Men 50+     Omega-3 Fatty Acids (FISH OIL) 1200 MG CAPS Take 1,200 mg by mouth in the morning.     pantoprazole (PROTONIX) 40 MG tablet TAKE 1 TABLET BY MOUTH ONCE DAILY. 90 tablet 3   tiZANidine (ZANAFLEX) 4 MG tablet Take 1 tablet (4 mg total) by mouth every 8 (eight) hours as needed for muscle spasms. 15 tablet 0   No current facility-administered medications on file prior to visit.    No Known Allergies  Review of Systems No fevers, chills, nausea, muscle aches, no difficulty breathing, no calf pain, no chest pain or shortness of breath.   Physical Exam  GENERAL APPEARANCE: Alert, conversant. Appropriately groomed. No acute distress.   VASCULAR: Pedal pulses palpable 2/4 DP and 2/4 PT bilateral.  Capillary refill time is immediate to all digits,  Proximal to distal cooling it warm to warm.  Digital perfusion adequate.   NEUROLOGIC: Epicritic and protective sensation is absent bilateral.  Sensation is absent to 5.07 monofilament at 0/5 sites bilateral.  Light touch is decreased bilateral, vibratory sensation absent bilateral  MUSCULOSKELETAL: acceptable muscle strength, tone and stability bilateral.  Contracture hammertoe deformity is noted digits 2 through 5 bilateral.  Prominent plantarflexed metatarsals noted  bilateral.  DERMATOLOGIC: skin is warm, supple, and dry.  Color, texture, and turgor of skin within normal limits.  No open wounds are noted.  Digital nails are mycotic but asymptomatic.  X-ray evaluation 3 views of bilateral feet are obtained.  No sign of fracture or dislocation are noted.  Contracture hammertoe deformities are seen bilateral.  Pes planus deformity is noted bilateral.   Assessment    ICD-10-CM   1. Neuropathy  G62.9     2. Hammertoes of both feet  M20.41 DG Foot Complete  Right   M20.42 DG Foot Complete Left       Plan  Discussed exam findings with the patient and his wife.  He does have significant neuropathy bilateral with no obvious origin known.  Relates no acute trauma or injury.  No significant back pain is reported.  The neuropathy is painful therefore I am going to start him on a very low-dose of gabapentin 100 mg nightly which she can titrate up to 3 times a day over the next 4 weeks.  Also recommended a consult with neurology to see if the origin of the neuropathy can be determined.  Referral will be requested.

## 2022-02-19 ENCOUNTER — Other Ambulatory Visit: Payer: Medicare Other

## 2022-02-26 ENCOUNTER — Ambulatory Visit: Payer: Medicare Other | Admitting: Hematology

## 2022-03-20 ENCOUNTER — Ambulatory Visit (INDEPENDENT_AMBULATORY_CARE_PROVIDER_SITE_OTHER): Payer: Medicare Other | Admitting: Neurology

## 2022-03-20 ENCOUNTER — Encounter: Payer: Self-pay | Admitting: Neurology

## 2022-03-20 VITALS — BP 131/69 | HR 67 | Ht 74.0 in | Wt 235.0 lb

## 2022-03-20 DIAGNOSIS — R202 Paresthesia of skin: Secondary | ICD-10-CM

## 2022-03-20 DIAGNOSIS — Z136 Encounter for screening for cardiovascular disorders: Secondary | ICD-10-CM

## 2022-03-20 DIAGNOSIS — G3184 Mild cognitive impairment, so stated: Secondary | ICD-10-CM

## 2022-03-20 DIAGNOSIS — G40909 Epilepsy, unspecified, not intractable, without status epilepticus: Secondary | ICD-10-CM

## 2022-03-20 DIAGNOSIS — G629 Polyneuropathy, unspecified: Secondary | ICD-10-CM

## 2022-03-20 DIAGNOSIS — R7989 Other specified abnormal findings of blood chemistry: Secondary | ICD-10-CM | POA: Diagnosis not present

## 2022-03-20 MED ORDER — GABAPENTIN 100 MG PO CAPS: 200.0000 mg | ORAL_CAPSULE | Freq: Three times a day (TID) | ORAL | 2 refills | Status: DC

## 2022-03-20 MED ORDER — DONEPEZIL HCL 5 MG PO TABS: 5.0000 mg | ORAL_TABLET | Freq: Every day | ORAL | 6 refills | Status: DC

## 2022-03-20 NOTE — Progress Notes (Signed)
[facility]  PATIENT: William Burns DOB: 20-Aug-1945  REQUESTING CLINICIAN: Bronson Ing, DPM HISTORY FROM: Patient and spouse  REASON FOR VISIT: Hx of seizure and worsening memory    HISTORICAL  CHIEF COMPLAINT:  Chief Complaint  Patient presents with   New Patient (Initial Visit)    Rm 12, with wife C/o neuropathy pain in both, numbness in toes    INTERVAL HISTORY 03/20/2022:  Patient presents today for follow-up, he is accompanied by his wife.  Last visit was in January, since then he has not had episodes of seizure or seizure-like activity.  He is compliant with the Depakote. At his last visit with a podiatrist he complained of numbness in the bilateral feet.  Due to that he was referred to neurology.  He reports the numbness and pain started about 6 months ago.  He started at the toes and all the way to the ankle.  Described the pain as aching, denies any burning or stabbing pain.  He does have history of bilateral hammertoe with pain.  He denies any recent falls. He was started on Gabapentin 100 mg, uptitrated to 200 mg at night with minimal relief.    HISTORY OF PRESENT ILLNESS:  This is a 76 year old gentleman with past medical history of colon cancer, BPH, seizures and worsening memory who is presenting to establish care.  Patient reported he had his first lifetime seizure on June 25, 2020.  Per wife he was around 12:45 at night when she noted patient starts shaking on the bed, all body shaking, she could not stop it.  EMS was called and patient was taken to the ED.  He was noted to have postictal confusion.  No reported tongue biting or urinary incontinence.  He was back to his normal self and discharged home with neurology follow-up.  Patient reported he follow-up with a neurologist, had a EEG, was told that the EEG was abnormal (showed seizures) and he was started on antiseizure medication. He was started on Depakote 250 mg twice daily.  Since  then he has been compliant with the Depakote, denies any additional seizures but has not followed up with neurology for the past year.  In terms of his memory problem, he describes issues with recognizing people face, trouble with remembering people's names,  wife reports that he will asked the same questions over and over and sometimes repeat himself too.  Wife also has to remind him of events.  Other than that, he is still very active, he working in his farm, he still drives, denies any accident, denies being lost in familiar places.  He is currently on Namenda 5 mg twice daily. Wife handles all finances, she has been doing it for many years.   OTHER MEDICAL CONDITIONS: Colon cancer, BPH, seizure disorder, memory problem,   REVIEW OF SYSTEMS: Full 14 system review of systems performed and negative with exception of: As noted in HPI  ALLERGIES: No Known Allergies  HOME MEDICATIONS: Outpatient Medications Prior to Visit  Medication Sig Dispense Refill   Cholecalciferol (VITAMIN D3) 125 MCG (5000 UT) TABS Take by mouth in the morning.     Cyanocobalamin (VITAMIN B-12) 5000 MCG SUBL Place 5,000 mcg under the tongue in the morning.     divalproex (DEPAKOTE) 250 MG DR tablet Take 250 mg by mouth in the morning and at bedtime.     Lactobacillus (ACIDOPHILUS PO) Take 1 capsule by mouth in the morning. Now Pb8 Acidophilus  loratadine (CLARITIN) 10 MG tablet Take 10 mg by mouth in the morning.     Multiple Vitamin (MULTIVITAMIN WITH MINERALS) TABS tablet Take 1 tablet by mouth in the morning. Centrum Silver for Men 50+     Omega-3 Fatty Acids (FISH OIL) 1200 MG CAPS Take 1,200 mg by mouth in the morning.     pantoprazole (PROTONIX) 40 MG tablet TAKE 1 TABLET BY MOUTH ONCE DAILY. 90 tablet 3   tiZANidine (ZANAFLEX) 4 MG tablet Take 1 tablet (4 mg total) by mouth every 8 (eight) hours as needed for muscle spasms. 15 tablet 0   gabapentin (NEURONTIN) 100 MG capsule Take 1 capsule (100 mg total) by  mouth 3 (three) times daily. Take 1 tab an hour before bedtime for 2 weeks,  may then increase to 1 tab at dinner  (for a total of 2 tabs)for 2 weeks.  If needed, may increase to 1 tab during the day for a total of 3 tabs per day. 90 capsule 3   memantine (NAMENDA) 10 MG tablet Take 1 tablet (10 mg total) by mouth 2 (two) times daily. 60 tablet 11   No facility-administered medications prior to visit.    PAST MEDICAL HISTORY: Past Medical History:  Diagnosis Date   BPH (benign prostatic hyperplasia)    Cancer ([place])    colon   Cataract    Colonic mass 07/03/2011   Difficult intubation    extra long tube for intub per wife; states "his neck is longer than regular people" and slow to wake up    Erosive gastritis 07/03/2011   GERD (gastroesophageal reflux disease)    History of kidney stones    Kidney stones 2011   Renal calculus    Tooth infection 07/03/2011    PAST SURGICAL HISTORY: Past Surgical History:  Procedure Laterality Date   CATARACT EXTRACTION W/PHACO Left 08/23/2012   Procedure: CATARACT EXTRACTION PHACO AND INTRAOCULAR LENS PLACEMENT ([place]);  Surgeon: Tonny Branch, MD;  Location: [facility];  Service: Ophthalmology;  Laterality: Left;  CDE 16.64   CATARACT EXTRACTION W/PHACO Right 06/09/2013   Procedure: CATARACT EXTRACTION PHACO AND INTRAOCULAR LENS PLACEMENT ([facility]) CDE=10.86;  Surgeon: Tonny Branch, MD;  Location: [facility];  Service: Ophthalmology;  Laterality: Right;   COLON SURGERY     COLONOSCOPY  07/03/2011   Procedure: COLONOSCOPY;  Surgeon: Rogene Houston, MD;  [facility];  Service: Endoscopy;  Laterality: N/A;   COLONOSCOPY N/A 09/16/2012   Procedure: COLONOSCOPY;  Surgeon: Rogene Houston, MD;  [facility];  Service: Endoscopy;  Laterality: N/A;  930   COLONOSCOPY N/A 12/05/2015   Procedure: COLONOSCOPY;  Surgeon: Rogene Houston, MD;  [facility];  Service: Endoscopy;  Laterality: N/A;  1200   COLONOSCOPY WITH PROPOFOL N/A 02/06/2021    Procedure: COLONOSCOPY WITH PROPOFOL;  Surgeon: Rogene Houston, MD;  [facility]: [facility];  Service: Endoscopy;  Laterality: N/A;  12:50   CYSTOSCOPY  07/09/2011   Procedure: CYSTOSCOPY FLEXIBLE;  Surgeon: Marissa Nestle, MD;  Location: [facility];  Service: Urology;  Laterality: N/A;   CYSTOSCOPY WITH LITHOLAPAXY  07/11/2011   Procedure: CYSTOSCOPY WITH LITHOLAPAXY;  Surgeon: Marissa Nestle, MD;  Location: [facility];  Service: Urology;  Laterality: N/A;   ESOPHAGOGASTRODUODENOSCOPY  07/03/2011   Procedure: ESOPHAGOGASTRODUODENOSCOPY (EGD);  Surgeon: Rogene Houston, MD;  [facility];  Service: Endoscopy;  Laterality: N/A;   HERNIA REPAIR  1194    umbilical  INGUINAL HERNIA REPAIR Right 05/04/2017   Procedure: HERNIA REPAIR INGUINAL ADULT WITH MESH;  Surgeon: Aviva Signs, MD;  Location: [facility];  Service: General;  Laterality: Right;   PARTIAL COLECTOMY  07/04/2011   Procedure: PARTIAL COLECTOMY;  Surgeon: Jamesetta So, MD;  Location: [facility];  Service: General;  Laterality: N/A;   POLYPECTOMY  02/06/2021   Procedure: POLYPECTOMY;  Surgeon: Rogene Houston, MD;  [facility];  Service: Endoscopy;;   Prostage cancer     PROSTATE BIOPSY     ROBOT ASSISTED LAPAROSCOPIC RADICAL PROSTATECTOMY Bilateral 01/19/2015   Procedure: ROBOTIC ASSISTED LAPAROSCOPIC RADICAL PROSTATECTOMY AND BILATERAL PELVIC LYMPH NODE DISSECTION, LAPARASCOPIC  LYSIS OF ADHESIONS;  Surgeon: Ardis Hughs, MD;  Location: [facility];  Service: Urology;  Laterality: Bilateral;   TRANSURETHRAL RESECTION OF PROSTATE  07/11/2011   Procedure: TRANSURETHRAL RESECTION OF THE PROSTATE (TURP);  Surgeon: Marissa Nestle, MD;  Location: [facility];  Service: Urology;  Laterality: N/A;    FAMILY HISTORY: Family History  Problem Relation Age of Onset   Alzheimer's disease Mother    Alzheimer's disease Father    Aneurysm Brother        brain   Anuerysm Brother    Healthy Son    Healthy Son    Colon cancer Neg Hx      SOCIAL HISTORY: Social History   Socioeconomic History   Marital status: Married    Spouse name: Aarit Kashuba   Number of children: 2   Years of education: 12   Highest education level: 12th grade  Occupational History   Occupation: farmer    Comment: tobacco; cattle; poultry  Tobacco Use   Smoking status: Former    Packs/day: 2.00    Years: 25.00    Total pack years: 50.00    Types: Cigarettes    Quit date: 06/02/1989    Years since quitting: 32.8    Passive exposure: Past   Smokeless tobacco: Never  Vaping Use   Vaping Use: Never used  Substance and Sexual Activity   Alcohol use: No   Drug use: No   Sexual activity: Not Currently    Partners: Female  Other Topics Concern   Not on file  Social History Narrative   Right handed    Caffeine- none    Lives at home with wife Judeen Hammans    Social Determinants of Health   Financial Resource Strain: Low Risk  (01/01/2022)   Overall Financial Resource Strain (CARDIA)    Difficulty of Paying Living Expenses: Not hard at all  Food Insecurity: No Food Insecurity (01/01/2022)   Hunger Vital Sign    Worried About Running Out of Food in the Last Year: Never true    Ran Out of Food in the Last Year: Never true  Transportation Needs: No Transportation Needs (01/01/2022)   PRAPARE - Hydrologist (Medical): No    Lack of Transportation (Non-Medical): No  Physical Activity: Inactive (01/01/2022)   Exercise Vital Sign    Days of Exercise per Week: 0 days    Minutes of Exercise per Session: 0 min  Stress: No Stress Concern Present (01/01/2022)   [place]    Feeling of Stress : Only a little  Social Connections: Socially Integrated (01/01/2022)   Social Connection and Isolation Panel [NHANES]    Frequency of Communication with Friends and Family: More than three times a week    Frequency of  Social Gatherings with Friends and Family: More than  three times a week    Attends Religious Services: More than 4 times per year    Active Member of [employer] or Organizations: Yes    Attends Music therapist: More than 4 times per year    Marital Status: Married  Human resources officer Violence: Not At Risk (01/01/2022)   Humiliation, Afraid, Rape, and Kick questionnaire    Fear of Current or Ex-Partner: No    Emotionally Abused: No    Physically Abused: No    Sexually Abused: No    PHYSICAL EXAM  GENERAL EXAM/CONSTITUTIONAL: Vitals:  Vitals:   03/20/22 1313  BP: 131/69  Pulse: 67  Weight: 235 lb (106.6 kg)  Height: $Remove'6\' 2"'CspLIdf$  (1.88 m)    Body mass index is 30.17 kg/m. Wt Readings from Last 3 Encounters:  03/20/22 235 lb (106.6 kg)  06/27/21 226 lb (102.5 kg)  02/19/21 239 lb 10.2 oz (108.7 kg)   Patient is in no distress; well developed, nourished and groomed; neck is supple  CARDIOVASCULAR: Examination of carotid arteries is normal; no carotid bruits Regular rate and rhythm, no murmurs Examination of peripheral vascular system by observation and palpation is normal  EYES: Pupils round and reactive to light, Visual fields full to confrontation, Extraocular movements intacts,   MUSCULOSKELETAL: Gait, strength, tone, movements noted in Neurologic exam below  NEUROLOGIC: MENTAL STATUS:      No data to display         awake, alert, oriented to person, place and time recent and remote memory intact normal attention and concentration language fluent, comprehension intact, naming intact fund of knowledge appropriate  CRANIAL NERVE:  2nd, 3rd, 4th, 6th - pupils equal and reactive to light, visual fields full to confrontation, extraocular muscles intact, no nystagmus 5th - facial sensation symmetric 7th - facial strength symmetric 8th - hearing intact 9th - palate elevates symmetrically, uvula midline 11th - shoulder shrug symmetric 12th - tongue protrusion midline  MOTOR:  normal bulk and tone, full strength  in the BUE, BLE  SENSORY:  Decrease sensation to pinprick and vibration to bilateral feet up to ankle. Normal proprioception.   COORDINATION:  finger-nose-finger, fine finger movements normal  REFLEXES:  deep tendon reflexes present and symmetric  GAIT/STATION:  normal   DIAGNOSTIC DATA (LABS, IMAGING, TESTING) - I reviewed patient records, labs, notes, testing and imaging myself where available.  Lab Results  Component Value Date   WBC 6.4 02/12/2021   HGB 13.9 02/12/2021   HCT 42.7 02/12/2021   MCV 93.6 02/12/2021   PLT 247 02/12/2021      Component Value Date/Time   NA 140 06/27/2021 1505   K 4.9 06/27/2021 1505   CL 100 06/27/2021 1505   CO2 26 06/27/2021 1505   GLUCOSE 92 06/27/2021 1505   GLUCOSE 99 02/12/2021 1115   BUN 12 06/27/2021 1505   CREATININE 0.78 06/27/2021 1505   CALCIUM 9.4 06/27/2021 1505   PROT 6.6 06/27/2021 1505   ALBUMIN 4.1 06/27/2021 1505   AST 22 06/27/2021 1505   ALT 14 06/27/2021 1505   ALKPHOS 79 06/27/2021 1505   BILITOT 0.5 06/27/2021 1505   GFRNONAA >60 02/12/2021 1115   GFRAA >60 02/07/2020 1123   Lab Results  Component Value Date   CHOL 167 01/26/2015   HDL 26 (L) 01/26/2015   LDLCALC 111 (H) 01/26/2015   TRIG 151 (H) 01/26/2015   CHOLHDL 6.4 01/26/2015   Lab Results  Component Value Date  HGBA1C 5.5 01/26/2015   Lab Results  Component Value Date   VITAMINB12 268 07/01/2011   Lab Results  Component Value Date   TSH 2.609 01/25/2015    Head CT 06/25/2020 1. No acute intracranial abnormalities. 2. Chronic atrophy and small vessel ischemic changes.   ASSESSMENT AND PLAN  76 y.o. year old male with history of colon cancer, BPH, seizure disorder, MCI who is presenting with complaints of pain and numbness in the BLE, exam consistent with peripheral neuropathy. He was already started on Gabapentin and currently taking Gabapentin 200 mg nightly.  We will continue to increase gabapentin as tolerated up to 200 mg 3  times daily.  In terms of His MCI, I will increase his Namenda 10 mg twice daily and start him on Aricept 5 mg nightly.  In terms of the seizures, he is doing well on Depakote 250 mg twice daily.  Follow-up in 6 months or sooner if worse.   1. Neuropathy   2. Encounter for screening for cardiovascular disorders   3. Other specified abnormal findings of blood chemistry   4. Paresthesia of skin   5. Seizure disorder ([place])   6. Mild cognitive impairment       Patient Instructions  We will obtain neuropathy labs Increase gabapentin up to 200 mg 3 times daily Increase Namenda to 10 mg twice daily Start Aricept 5 mg nightly Continue with Depakote 250 mg twice daily  Follow-up in 6 months or sooner if worse.  Orders Placed This Encounter  Procedures   CBC with diff   CMP   Vitamin B12   MMA   Homocysteine   A1c   TSH   SPEP with IFE   ANA w/Reflex   SSA, SSB   ESR   CRP    Meds ordered this encounter  Medications   gabapentin (NEURONTIN) 100 MG capsule    Sig: Take 2 capsules (200 mg total) by mouth 3 (three) times daily. Take 1 tab an hour before bedtime for 2 weeks,  may then increase to 1 tab at dinner  (for a total of 2 tabs)for 2 weeks.  If needed, may increase to 1 tab during the day for a total of 3 tabs per day.    Dispense:  540 capsule    Refill:  2   donepezil (ARICEPT) 5 MG tablet    Sig: Take 1 tablet (5 mg total) by mouth at bedtime.    Dispense:  30 tablet    Refill:  6    Return in about 6 months (around 09/19/2022).    Alric Ran, MD 03/20/2022, 4:24 PM  [facility] [address], [place], [place] [address] [phone]

## 2022-03-20 NOTE — Patient Instructions (Signed)
We will obtain neuropathy labs Increase gabapentin up to 200 mg 3 times daily Increase Namenda to 10 mg twice daily Start Aricept 5 mg nightly Continue with Depakote 250 mg twice daily  Follow-up in 6 months or sooner if worse.

## 2022-03-25 LAB — MULTIPLE MYELOMA PANEL, SERUM
Albumin SerPl Elph-Mcnc: 3.4 g/dL (ref 2.9–4.4)
Albumin/Glob SerPl: 1.2 (ref 0.7–1.7)
Alpha 1: 0.2 g/dL (ref 0.0–0.4)
Alpha2 Glob SerPl Elph-Mcnc: 0.7 g/dL (ref 0.4–1.0)
B-Globulin SerPl Elph-Mcnc: 1.1 g/dL (ref 0.7–1.3)
Gamma Glob SerPl Elph-Mcnc: 1 g/dL (ref 0.4–1.8)
Globulin, Total: 3 g/dL (ref 2.2–3.9)
IgA/Immunoglobulin A, Serum: 233 mg/dL (ref 61–437)
IgG (Immunoglobin G), Serum: 1059 mg/dL (ref 603–1613)
IgM (Immunoglobulin M), Srm: 50 mg/dL (ref 15–143)

## 2022-03-25 LAB — CBC WITH DIFFERENTIAL/PLATELET
Basophils Absolute: 0.1 10*3/uL (ref 0.0–0.2)
Basos: 1 %
EOS (ABSOLUTE): 0.3 10*3/uL (ref 0.0–0.4)
Eos: 4 %
Hematocrit: 39.5 % (ref 37.5–51.0)
Hemoglobin: 13.3 g/dL (ref 13.0–17.7)
Immature Grans (Abs): 0 10*3/uL (ref 0.0–0.1)
Immature Granulocytes: 0 %
Lymphocytes Absolute: 1.6 10*3/uL (ref 0.7–3.1)
Lymphs: 24 %
MCH: 30.4 pg (ref 26.6–33.0)
MCHC: 33.7 g/dL (ref 31.5–35.7)
MCV: 90 fL (ref 79–97)
Monocytes Absolute: 0.6 10*3/uL (ref 0.1–0.9)
Monocytes: 9 %
Neutrophils Absolute: 4 10*3/uL (ref 1.4–7.0)
Neutrophils: 62 %
Platelets: 287 10*3/uL (ref 150–450)
RBC: 4.37 x10E6/uL (ref 4.14–5.80)
RDW: 12.3 % (ref 11.6–15.4)
WBC: 6.5 10*3/uL (ref 3.4–10.8)

## 2022-03-25 LAB — COMPREHENSIVE METABOLIC PANEL
ALT: 11 IU/L (ref 0–44)
AST: 22 IU/L (ref 0–40)
Albumin/Globulin Ratio: 1.9 (ref 1.2–2.2)
Albumin: 4.2 g/dL (ref 3.8–4.8)
Alkaline Phosphatase: 79 IU/L (ref 44–121)
BUN/Creatinine Ratio: 12 (ref 10–24)
BUN: 11 mg/dL (ref 8–27)
Bilirubin Total: 0.2 mg/dL (ref 0.0–1.2)
CO2: 29 mmol/L (ref 20–29)
Calcium: 9.2 mg/dL (ref 8.6–10.2)
Chloride: 104 mmol/L (ref 96–106)
Creatinine, Ser: 0.94 mg/dL (ref 0.76–1.27)
Globulin, Total: 2.2 g/dL (ref 1.5–4.5)
Glucose: 102 mg/dL — ABNORMAL HIGH (ref 70–99)
Potassium: 4.7 mmol/L (ref 3.5–5.2)
Sodium: 144 mmol/L (ref 134–144)
Total Protein: 6.4 g/dL (ref 6.0–8.5)
eGFR: 83 mL/min/{1.73_m2} (ref >59)

## 2022-03-25 LAB — HEMOGLOBIN A1C
Est. average glucose Bld gHb Est-mCnc: 108 mg/dL
Hgb A1c MFr Bld: 5.4 % (ref 4.8–5.6)

## 2022-03-25 LAB — VITAMIN B12: Vitamin B-12: >2000 pg/mL — ABNORMAL HIGH (ref 232–1245)

## 2022-03-25 LAB — METHYLMALONIC ACID, SERUM: Methylmalonic Acid: 125 nmol/L (ref 0–378)

## 2022-03-25 LAB — SEDIMENTATION RATE: Sed Rate: 13 mm/hr (ref 0–30)

## 2022-03-25 LAB — C-REACTIVE PROTEIN: CRP: 1 mg/L (ref 0–10)

## 2022-03-25 LAB — HOMOCYSTEINE: Homocysteine: 8.3 umol/L (ref 0.0–19.2)

## 2022-03-25 LAB — ANA W/REFLEX: ANA Titer 1: NEGATIVE

## 2022-03-25 LAB — SJOGREN'S SYNDROME ANTIBODS(SSA + SSB)
ENA SSA (RO) Ab: <0.2 AI (ref 0.0–0.9)
ENA SSB (LA) Ab: <0.2 AI (ref 0.0–0.9)

## 2022-03-25 LAB — TSH: TSH: 2.97 u[IU]/mL (ref 0.450–4.500)

## 2022-03-26 NOTE — Progress Notes (Signed)
Please call and advise the patient that the recent extensive neuropathy labs we checked were within normal limits. We checked  vitamin B12 level, cell count, blood sugar, diabetes marker, electrolytes, kidney function, liver function, thyroid function, inflammatory markers, sedimentation rate. No further action is required on these tests at this time. Please remind patient to keep any upcoming appointments or tests and to call us with any interim questions, concerns, problems or updates. Thanks,   Alric Ran, MD

## 2022-04-09 DIAGNOSIS — H04563 Stenosis of bilateral lacrimal punctum: Secondary | ICD-10-CM | POA: Diagnosis not present

## 2022-04-09 DIAGNOSIS — H02132 Senile ectropion of right lower eyelid: Secondary | ICD-10-CM | POA: Diagnosis not present

## 2022-04-09 DIAGNOSIS — H02102 Unspecified ectropion of right lower eyelid: Secondary | ICD-10-CM | POA: Diagnosis not present

## 2022-04-09 DIAGNOSIS — H04562 Stenosis of left lacrimal punctum: Secondary | ICD-10-CM | POA: Diagnosis not present

## 2022-04-09 DIAGNOSIS — H02105 Unspecified ectropion of left lower eyelid: Secondary | ICD-10-CM | POA: Diagnosis not present

## 2022-04-09 DIAGNOSIS — H04561 Stenosis of right lacrimal punctum: Secondary | ICD-10-CM | POA: Diagnosis not present

## 2022-04-09 DIAGNOSIS — H02135 Senile ectropion of left lower eyelid: Secondary | ICD-10-CM | POA: Diagnosis not present

## 2022-04-12 ENCOUNTER — Encounter (INDEPENDENT_AMBULATORY_CARE_PROVIDER_SITE_OTHER): Payer: Self-pay | Admitting: Gastroenterology

## 2022-05-01 ENCOUNTER — Encounter (HOSPITAL_COMMUNITY): Payer: Self-pay | Admitting: Family Medicine

## 2022-05-01 ENCOUNTER — Ambulatory Visit (HOSPITAL_COMMUNITY)
Admission: RE | Admit: 2022-05-01 | Discharge: 2022-05-01 | Disposition: A | Discharge status: Home / Self Care | Payer: Medicare Other | Source: Ambulatory Visit | Attending: Family Medicine | Admitting: Family Medicine

## 2022-05-01 ENCOUNTER — Other Ambulatory Visit (HOSPITAL_COMMUNITY): Payer: Self-pay | Admitting: Family Medicine

## 2022-05-01 DIAGNOSIS — M7989 Other specified soft tissue disorders: Secondary | ICD-10-CM | POA: Diagnosis not present

## 2022-05-01 DIAGNOSIS — R2242 Localized swelling, mass and lump, left lower limb: Secondary | ICD-10-CM

## 2022-05-04 ENCOUNTER — Other Ambulatory Visit: Payer: Self-pay

## 2022-05-04 ENCOUNTER — Emergency Department (HOSPITAL_COMMUNITY)
Admission: EM | Admit: 2022-05-04 | Discharge: 2022-05-05 | Disposition: A | Discharge status: Home / Self Care | Payer: Medicare Other | Attending: Emergency Medicine | Admitting: Emergency Medicine

## 2022-05-04 ENCOUNTER — Encounter (HOSPITAL_COMMUNITY): Payer: Self-pay | Admitting: Emergency Medicine

## 2022-05-04 DIAGNOSIS — M79652 Pain in left thigh: Secondary | ICD-10-CM | POA: Diagnosis not present

## 2022-05-04 DIAGNOSIS — S8012XD Contusion of left lower leg, subsequent encounter: Secondary | ICD-10-CM

## 2022-05-04 DIAGNOSIS — X58XXXA Exposure to other specified factors, initial encounter: Secondary | ICD-10-CM | POA: Insufficient documentation

## 2022-05-04 DIAGNOSIS — S7012XD Contusion of left thigh, subsequent encounter: Secondary | ICD-10-CM | POA: Diagnosis not present

## 2022-05-04 NOTE — ED Triage Notes (Addendum)
Pt reports he had a fall x 2 weeks ago, increased pain to left calf with warmth and redness that has been increasing, spreading up to thigh; pt had xray here that was negative for fracture

## 2022-05-05 ENCOUNTER — Telehealth (HOSPITAL_COMMUNITY): Payer: Self-pay | Admitting: Student

## 2022-05-05 ENCOUNTER — Ambulatory Visit (HOSPITAL_COMMUNITY)
Admission: RE | Admit: 2022-05-05 | Discharge: 2022-05-05 | Disposition: A | Discharge status: Home / Self Care | Payer: Medicare Other | Source: Ambulatory Visit | Attending: Emergency Medicine | Admitting: Emergency Medicine

## 2022-05-05 DIAGNOSIS — M79652 Pain in left thigh: Secondary | ICD-10-CM | POA: Insufficient documentation

## 2022-05-05 DIAGNOSIS — I82812 Embolism and thrombosis of superficial veins of left lower extremities: Secondary | ICD-10-CM | POA: Diagnosis not present

## 2022-05-05 MED ORDER — APIXABAN (ELIQUIS) VTE STARTER PACK (10MG AND 5MG): ORAL_TABLET | ORAL | 0 refills | Status: DC

## 2022-05-05 MED ORDER — APIXABAN 5 MG PO TABS
10.0000 mg | ORAL_TABLET | Freq: Once | ORAL | Status: AC
  Administered 2022-05-05: 10 mg via ORAL
  Filled 2022-05-05: qty 2

## 2022-05-05 NOTE — Telephone Encounter (Signed)
Patient return to the emergency department for ultrasound imaging that does show a large superficial clot extending from the calf to the greater saphenous vein.  No DVT.  I did speak with the vascular surgeon on-call Dr. Gwenlyn Saran who recommends 45 days of Eliquis and outpatient vascular follow-up.  I spoke with the patient and placed the necessary referrals as well as refilled his Eliquis.

## 2022-05-05 NOTE — Discharge Instructions (Signed)
Apply ice to sore areas.  Ice to be applied for 30 minutes at a time, 4 times a day.  Return in the morning for an ultrasound to make sure you do not have a blood clot.  If you do have a blood clot, you will need to be on blood thinners for the next 6 weeks.  You may take acetaminophen for pain.  If your ultrasound does not show any blood clots, you may also take ibuprofen.  When you combine ibuprofen and acetaminophen, you get better pain relief and you get from taking either medication by itself.  However, since you got a dose of a blood thinner here, you should not take any ibuprofen before 4 PM.

## 2022-05-05 NOTE — ED Provider Notes (Signed)
[facility] EMERGENCY DEPARTMENT Provider Note   CSN: [id number] Arrival date & time: 05/04/22  2142     History  Chief Complaint  Patient presents with   Leg Pain    William Burns is a 76 y.o. male.  The history is provided by the patient.  Leg Pain He has history of GERD, BPH and comes in because of concern for possible blood clot in his left leg.  He had fallen 2 weeks ago injuring his left calf.  He had seen his primary care provider who ordered an x-ray which was negative.  He has been treating it conservatively.  However, today he started having pain in his thigh and was concerned that it may actually represent a blood clot.  He denies any chest pain or difficulty breathing.   Home Medications Prior to Admission medications   Medication Sig Start Date End Date Taking? Authorizing Provider  Cholecalciferol (VITAMIN D3) 125 MCG (5000 UT) TABS Take by mouth in the morning.    [provider]  Cyanocobalamin (VITAMIN B-12) 5000 MCG SUBL Place 5,000 mcg under the tongue in the morning.    [provider]  divalproex (DEPAKOTE) 250 MG DR tablet Take 250 mg by mouth in the morning and at bedtime. 01/11/21   [provider]  donepezil (ARICEPT) 5 MG tablet Take 1 tablet (5 mg total) by mouth at bedtime. 03/20/22   Alric Ran, MD  gabapentin (NEURONTIN) 100 MG capsule Take 2 capsules (200 mg total) by mouth 3 (three) times daily. 03/20/22 12/15/22  Alric Ran, MD  Lactobacillus (ACIDOPHILUS PO) Take 1 capsule by mouth in the morning. Now Pb8 Acidophilus    [provider]  loratadine (CLARITIN) 10 MG tablet Take 10 mg by mouth in the morning.    [provider]  memantine (NAMENDA) 10 MG tablet Take 1 tablet (10 mg total) by mouth 2 (two) times daily. 06/27/21 07/27/21  Alric Ran, MD  Multiple Vitamin (MULTIVITAMIN WITH MINERALS) TABS tablet Take 1 tablet by mouth in the morning. Centrum Silver for Men 50+    [provider]  Omega-3 Fatty Acids (FISH OIL) 1200 MG CAPS Take 1,200 mg by mouth in the morning.    [provider]  pantoprazole (PROTONIX) 40 MG tablet TAKE 1 TABLET BY MOUTH ONCE DAILY. 11/15/18   Setzer, Rona Ravens, NP  tiZANidine (ZANAFLEX) 4 MG tablet Take 1 tablet (4 mg total) by mouth every 8 (eight) hours as needed for muscle spasms. 06/27/21   Alric Ran, MD      Allergies    Patient has no known allergies.    Review of Systems   Review of Systems  All other systems reviewed and are negative.   Physical Exam Updated Vital Signs BP (!) 153/78   Pulse 69   Temp 98.1 F (36.7 C) (Oral)   Resp 17   Ht '6\' 2"'$  (1.88 m)   Wt 107.5 kg   SpO2 96%   BMI 30.43 kg/m  Physical Exam Vitals and nursing note reviewed.   76 year old male, resting comfortably and in no acute distress. Vital signs are significant for elevated blood pressure. Oxygen saturation is 96%, which is normal. Head is normocephalic and atraumatic. PERRLA, EOMI. Oropharynx is clear. Neck is nontender and supple. Lungs are clear without rales, wheezes, or rhonchi. Chest is nontender. Heart has regular rate and rhythm without murmur. Abdomen is soft, flat, nontender. Extremities: There is mild ecchymosis noted on the medial aspect  of the left calf.  There is mild tenderness to palpation over the medial aspect of the distal left thigh but without any significant swelling. Skin is warm and dry without rash. Neurologic: Mental status is normal, cranial nerves are intact, moves all extremities equally.  ED Results / Procedures / Treatments    Procedures Procedures    Medications Ordered in ED Medications - No data to display  ED Course/ Medical Decision Making/ A&P                           Medical Decision Making  Pain in left thigh while convalescent from contusion to the left calf.  I have ordered an outpatient venous ultrasound to rule out DVT.  I have ordered a dose of apixaban here to treat possible  DVT until ultrasound results are available.  Final Clinical Impression(s) / ED Diagnoses Final diagnoses:  Pain in left thigh  Contusion of left lower leg, subsequent encounter    Rx / DC Orders ED Discharge Orders          Ordered    US Venous Img Lower Unilateral Left        05/05/22 4982              Delora Fuel, MD [birth year] [phone]

## 2022-05-07 ENCOUNTER — Other Ambulatory Visit (HOSPITAL_COMMUNITY): Payer: Self-pay | Admitting: Family Medicine

## 2022-05-07 DIAGNOSIS — R2242 Localized swelling, mass and lump, left lower limb: Secondary | ICD-10-CM

## 2022-05-07 DIAGNOSIS — I82819 Embolism and thrombosis of superficial veins of unspecified lower extremities: Secondary | ICD-10-CM | POA: Diagnosis not present

## 2022-05-29 ENCOUNTER — Other Ambulatory Visit (HOSPITAL_COMMUNITY): Payer: Self-pay | Admitting: Family Medicine

## 2022-05-29 DIAGNOSIS — S0990XA Unspecified injury of head, initial encounter: Secondary | ICD-10-CM

## 2022-05-29 DIAGNOSIS — W19XXXA Unspecified fall, initial encounter: Secondary | ICD-10-CM | POA: Diagnosis not present

## 2022-06-09 ENCOUNTER — Other Ambulatory Visit: Payer: Self-pay | Admitting: *Deleted

## 2022-06-09 DIAGNOSIS — I82402 Acute embolism and thrombosis of unspecified deep veins of left lower extremity: Secondary | ICD-10-CM

## 2022-06-11 ENCOUNTER — Encounter (HOSPITAL_COMMUNITY): Payer: Self-pay | Admitting: Hematology and Oncology

## 2022-06-16 ENCOUNTER — Encounter (HOSPITAL_COMMUNITY): Payer: Self-pay | Admitting: Hematology and Oncology

## 2022-06-18 ENCOUNTER — Ambulatory Visit (HOSPITAL_COMMUNITY)
Admission: RE | Admit: 2022-06-18 | Discharge: 2022-06-18 | Disposition: A | Discharge status: Home / Self Care | Payer: Medicare Other | Source: Ambulatory Visit | Attending: Family Medicine | Admitting: Family Medicine

## 2022-06-18 DIAGNOSIS — R42 Dizziness and giddiness: Secondary | ICD-10-CM | POA: Diagnosis not present

## 2022-06-18 DIAGNOSIS — S0990XA Unspecified injury of head, initial encounter: Secondary | ICD-10-CM | POA: Insufficient documentation

## 2022-06-25 ENCOUNTER — Other Ambulatory Visit: Payer: Self-pay | Admitting: Neurology

## 2022-06-25 ENCOUNTER — Ambulatory Visit (HOSPITAL_COMMUNITY)
Admission: RE | Admit: 2022-06-25 | Discharge: 2022-06-25 | Disposition: A | Discharge status: Home / Self Care | Payer: Medicare Other | Source: Ambulatory Visit | Attending: Vascular Surgery | Admitting: Vascular Surgery

## 2022-06-25 ENCOUNTER — Encounter: Payer: Self-pay | Admitting: Vascular Surgery

## 2022-06-25 ENCOUNTER — Ambulatory Visit (INDEPENDENT_AMBULATORY_CARE_PROVIDER_SITE_OTHER): Payer: Medicare Other | Admitting: Vascular Surgery

## 2022-06-25 VITALS — BP 143/70 | HR 62 | Temp 98.3°F | Resp 20 | Ht 74.0 in | Wt 236.0 lb

## 2022-06-25 DIAGNOSIS — I82402 Acute embolism and thrombosis of unspecified deep veins of left lower extremity: Secondary | ICD-10-CM | POA: Diagnosis not present

## 2022-06-25 DIAGNOSIS — I82812 Embolism and thrombosis of superficial veins of left lower extremities: Secondary | ICD-10-CM

## 2022-06-25 NOTE — Progress Notes (Signed)
Patient ID: William Burns, male   DOB: August 10, 1945, 77 y.o.   MRN: [id number]  Reason for Consult: New Patient (Initial Visit)   Referred by Celene Squibb, MD  Subjective:     HPI:  William Burns is a 77 y.o. male without significant vascular or venous disease.  He specifically denies any history of DVT or family history of DVT.  He does have a history of both prostate and colon cancer which have been treated and for which she is followed closely.  About 6 weeks ago he had an injury he had with a leaf blower to his left calf and subsequently developed worsening pain extending up the medial thigh and was diagnosed with superficial venous thrombosis and placed on anticoagulation.  He is not taking the anticoagulation for the past 6 weeks without any bleeding issues.  He remains at his usual level of activity.  He has some occasional calf pain but mostly is able to to perform all activities with minimal discomfort only.  Past Medical History:  Diagnosis Date   BPH (benign prostatic hyperplasia)    Cancer (HCC)    colon   Cataract    Colonic mass 07/03/2011   Difficult intubation    extra long tube for intub per wife; states "his neck is longer than regular people" and slow to wake up    Erosive gastritis 07/03/2011   GERD (gastroesophageal reflux disease)    History of kidney stones    Kidney stones 2011   Renal calculus    Tooth infection 07/03/2011   Family History  Problem Relation Age of Onset   Alzheimer's disease Mother    Alzheimer's disease Father    Aneurysm Brother        brain   Anuerysm Brother    Healthy Son    Healthy Son    Colon cancer Neg Hx    Past Surgical History:  Procedure Laterality Date   CATARACT EXTRACTION W/PHACO Left 08/23/2012   Procedure: CATARACT EXTRACTION PHACO AND INTRAOCULAR LENS PLACEMENT ([place]);  Surgeon: Tonny Branch, MD;  Location: [facility];  Service: Ophthalmology;  Laterality: Left;  CDE 16.64   CATARACT EXTRACTION W/PHACO Right 06/09/2013    Procedure: CATARACT EXTRACTION PHACO AND INTRAOCULAR LENS PLACEMENT ([facility]) CDE=10.86;  Surgeon: Tonny Branch, MD;  Location: [facility];  Service: Ophthalmology;  Laterality: Right;   COLON SURGERY     COLONOSCOPY  07/03/2011   Procedure: COLONOSCOPY;  Surgeon: Rogene Houston, MD;  [facility];  Service: Endoscopy;  Laterality: N/A;   COLONOSCOPY N/A 09/16/2012   Procedure: COLONOSCOPY;  Surgeon: Rogene Houston, MD;  [facility];  Service: Endoscopy;  Laterality: N/A;  930   COLONOSCOPY N/A 12/05/2015   Procedure: COLONOSCOPY;  Surgeon: Rogene Houston, MD;  [facility];  Service: Endoscopy;  Laterality: N/A;  1200   COLONOSCOPY WITH PROPOFOL N/A 02/06/2021   Procedure: COLONOSCOPY WITH PROPOFOL;  Surgeon: Rogene Houston, MD;  [facility]: [facility];  Service: Endoscopy;  Laterality: N/A;  12:50   CYSTOSCOPY  07/09/2011   Procedure: CYSTOSCOPY FLEXIBLE;  Surgeon: Marissa Nestle, MD;  Location: [facility];  Service: Urology;  Laterality: N/A;   CYSTOSCOPY WITH LITHOLAPAXY  07/11/2011   Procedure: CYSTOSCOPY WITH LITHOLAPAXY;  Surgeon: Marissa Nestle, MD;  Location: [facility];  Service: Urology;  Laterality: N/A;   ESOPHAGOGASTRODUODENOSCOPY  07/03/2011   Procedure: ESOPHAGOGASTRODUODENOSCOPY (EGD);  Surgeon: Rogene Houston, MD;  [facility];  Service: Endoscopy;  Laterality: N/A;   HERNIA REPAIR  9892    umbilical    INGUINAL HERNIA REPAIR Right 05/04/2017   Procedure: HERNIA REPAIR INGUINAL ADULT WITH MESH;  Surgeon: Aviva Signs, MD;  Location: [facility];  Service: General;  Laterality: Right;   PARTIAL COLECTOMY  07/04/2011   Procedure: PARTIAL COLECTOMY;  Surgeon: Jamesetta So, MD;  Location: [facility];  Service: General;  Laterality: N/A;   POLYPECTOMY  02/06/2021   Procedure: POLYPECTOMY;  Surgeon: Rogene Houston, MD;  [facility];  Service: Endoscopy;;   Prostage cancer     PROSTATE BIOPSY     ROBOT ASSISTED LAPAROSCOPIC RADICAL PROSTATECTOMY  Bilateral 01/19/2015   Procedure: ROBOTIC ASSISTED LAPAROSCOPIC RADICAL PROSTATECTOMY AND BILATERAL PELVIC LYMPH NODE DISSECTION, LAPARASCOPIC  LYSIS OF ADHESIONS;  Surgeon: Ardis Hughs, MD;  Location: [facility];  Service: Urology;  Laterality: Bilateral;   TRANSURETHRAL RESECTION OF PROSTATE  07/11/2011   Procedure: TRANSURETHRAL RESECTION OF THE PROSTATE (TURP);  Surgeon: Marissa Nestle, MD;  Location: [facility];  Service: Urology;  Laterality: N/A;    Short Social History:  Social History   Tobacco Use   Smoking status: Former    Packs/day: 2.00    Years: 25.00    Total pack years: 50.00    Types: Cigarettes    Quit date: 06/02/1989    Years since quitting: 33.0    Passive exposure: Past   Smokeless tobacco: Never  Substance Use Topics   Alcohol use: No    No Known Allergies  Current Outpatient Medications  Medication Sig Dispense Refill   Cholecalciferol (VITAMIN D3) 125 MCG (5000 UT) TABS Take by mouth in the morning.     divalproex (DEPAKOTE) 250 MG DR tablet Take 250 mg by mouth in the morning and at bedtime.     donepezil (ARICEPT) 5 MG tablet Take 1 tablet (5 mg total) by mouth at bedtime. 30 tablet 6   ELIQUIS 5 MG TABS tablet Take 5 mg by mouth 2 (two) times daily.     gabapentin (NEURONTIN) 100 MG capsule Take 2 capsules (200 mg total) by mouth 3 (three) times daily. 540 capsule 2   Lactobacillus (ACIDOPHILUS PO) Take 1 capsule by mouth in the morning. Now Pb8 Acidophilus     loratadine (CLARITIN) 10 MG tablet Take 10 mg by mouth in the morning.     Multiple Vitamin (MULTIVITAMIN WITH MINERALS) TABS tablet Take 1 tablet by mouth in the morning. Centrum Silver for Men 50+     Omega-3 Fatty Acids (FISH OIL) 1200 MG CAPS Take 1,200 mg by mouth in the morning.     pantoprazole (PROTONIX) 40 MG tablet TAKE 1 TABLET BY MOUTH ONCE DAILY. 90 tablet 3   memantine (NAMENDA) 10 MG tablet Take 1 tablet (10 mg total) by mouth 2 (two) times daily. 60 tablet 11   No current  facility-administered medications for this visit.    Review of Systems  Constitutional:  Constitutional negative. HENT: HENT negative.  Eyes: Eyes negative.  Respiratory: Respiratory negative.  Cardiovascular: Positive for leg swelling.  GI: Gastrointestinal negative.  Musculoskeletal: Positive for leg pain.  Neurological: Neurological negative. Hematologic: Hematologic/lymphatic negative.  Psychiatric: Psychiatric negative.        Objective:  Objective   Vitals:   06/25/22 1456  BP: (!) 143/70  Pulse: 62  Resp: 20  Temp: 98.3 F (36.8 C)  SpO2: 96%  Weight: 236 lb (107 kg)  Height: '6\' 2"'$  (1.88  m)   Body mass index is 30.3 kg/m.  Physical Exam HENT:     Head: Normocephalic.     Nose: Nose normal.  Eyes:     Pupils: Pupils are equal, round, and reactive to light.  Cardiovascular:     Rate and Rhythm: Normal rate.     Pulses: Normal pulses.  Pulmonary:     Effort: Pulmonary effort is normal.  Abdominal:     General: Abdomen is flat.     Palpations: Abdomen is soft.  Musculoskeletal:        General: Normal range of motion.     Cervical back: Normal range of motion and neck supple.     Comments: Palpable cord on the medial left calf  Skin:    Capillary Refill: Capillary refill takes less than 2 seconds.  Neurological:     General: No focal deficit present.     Mental Status: He is alert.  Psychiatric:        Mood and Affect: Mood normal.        Thought Content: Thought content normal.        Judgment: Judgment normal.     Data: RIGHTCompressibilityPhasicitySpontaneityPropertiesThrombus Aging  +-----+---------------+---------+-----------+----------+--------------+  CFV Full           Yes      Yes                                  +-----+---------------+---------+-----------+----------+--------------+         +---------+---------------+---------+-----------+----------+--------------+   LEFT     CompressibilityPhasicitySpontaneityPropertiesThrombus  Aging  +---------+---------------+---------+-----------+----------+--------------+   CFV     Full           Yes      Yes                                   +---------+---------------+---------+-----------+----------+--------------+   SFJ     Full           Yes      Yes                                   +---------+---------------+---------+-----------+----------+--------------+   FV Prox  Full           Yes      Yes                                   +---------+---------------+---------+-----------+----------+--------------+   FV Mid   Full           Yes      Yes                                   +---------+---------------+---------+-----------+----------+--------------+   FV DistalFull           Yes      Yes                                   +---------+---------------+---------+-----------+----------+--------------+   PFV     Full           Yes      Yes                                   +---------+---------------+---------+-----------+----------+--------------+  POP     Full           Yes      Yes                                   +---------+---------------+---------+-----------+----------+--------------+   PTV     Full           Yes      No                                    +---------+---------------+---------+-----------+----------+--------------+   PERO    Full           Yes      Yes                                   +---------+---------------+---------+-----------+----------+--------------+   Gastroc Full           Yes      Yes                                   +---------+---------------+---------+-----------+----------+--------------+   GSV     Partial        No       No                                    +---------+---------------+---------+-----------+----------+--------------+   SSV     Full           Yes      Yes                                    +---------+---------------+---------+-----------+----------+--------------+            Summary:   LEFT:  - Findings consistent with chronic superficial vein thrombosis involving  the left Greater saphenous vein mid calf extending to distal thigh area..  - There is no evidence of deep vein thrombosis in the lower extremity.  -      Assessment/Plan:    77 year old male presents for evaluation of long segment great saphenous vein superficial venous thrombosis confirmed with ultrasound today.  I discussed with the patient and his wife that the chronic thrombus will likely always be present.  I recommended continuing Eliquis until he finishes his current prescription and then he can transition to 81 mg aspirin daily.  I recommended knee-high compression stockings for symptom control as well as elevation of his legs when he is recumbent.  He can follow-up with me on an as-needed basis.   Waynetta Sandy MD [facility]

## 2022-06-30 ENCOUNTER — Ambulatory Visit: Payer: Medicare Other | Admitting: Neurology

## 2022-07-26 ENCOUNTER — Other Ambulatory Visit: Payer: Self-pay | Admitting: Neurology

## 2022-07-31 DIAGNOSIS — R7303 Prediabetes: Secondary | ICD-10-CM | POA: Diagnosis not present

## 2022-07-31 DIAGNOSIS — E782 Mixed hyperlipidemia: Secondary | ICD-10-CM | POA: Diagnosis not present

## 2022-07-31 DIAGNOSIS — E538 Deficiency of other specified B group vitamins: Secondary | ICD-10-CM | POA: Diagnosis not present

## 2022-07-31 DIAGNOSIS — Z125 Encounter for screening for malignant neoplasm of prostate: Secondary | ICD-10-CM | POA: Diagnosis not present

## 2022-07-31 DIAGNOSIS — G40909 Epilepsy, unspecified, not intractable, without status epilepticus: Secondary | ICD-10-CM | POA: Diagnosis not present

## 2022-08-05 ENCOUNTER — Telehealth: Payer: Self-pay | Admitting: *Deleted

## 2022-08-05 DIAGNOSIS — H8110 Benign paroxysmal vertigo, unspecified ear: Secondary | ICD-10-CM | POA: Diagnosis not present

## 2022-08-05 DIAGNOSIS — E669 Obesity, unspecified: Secondary | ICD-10-CM | POA: Diagnosis not present

## 2022-08-05 DIAGNOSIS — E538 Deficiency of other specified B group vitamins: Secondary | ICD-10-CM | POA: Diagnosis not present

## 2022-08-05 DIAGNOSIS — R7303 Prediabetes: Secondary | ICD-10-CM | POA: Diagnosis not present

## 2022-08-05 DIAGNOSIS — R4189 Other symptoms and signs involving cognitive functions and awareness: Secondary | ICD-10-CM | POA: Diagnosis not present

## 2022-08-05 DIAGNOSIS — K219 Gastro-esophageal reflux disease without esophagitis: Secondary | ICD-10-CM | POA: Diagnosis not present

## 2022-08-05 DIAGNOSIS — G40909 Epilepsy, unspecified, not intractable, without status epilepticus: Secondary | ICD-10-CM | POA: Diagnosis not present

## 2022-08-05 DIAGNOSIS — Z0001 Encounter for general adult medical examination with abnormal findings: Secondary | ICD-10-CM | POA: Diagnosis not present

## 2022-08-05 DIAGNOSIS — N1831 Chronic kidney disease, stage 3a: Secondary | ICD-10-CM | POA: Diagnosis not present

## 2022-08-05 DIAGNOSIS — I129 Hypertensive chronic kidney disease with stage 1 through stage 4 chronic kidney disease, or unspecified chronic kidney disease: Secondary | ICD-10-CM | POA: Diagnosis not present

## 2022-08-05 DIAGNOSIS — E782 Mixed hyperlipidemia: Secondary | ICD-10-CM | POA: Diagnosis not present

## 2022-08-05 DIAGNOSIS — H9193 Unspecified hearing loss, bilateral: Secondary | ICD-10-CM | POA: Diagnosis not present

## 2022-08-05 NOTE — Telephone Encounter (Signed)
Lab results  (08-02-2022) received from Dr. Allyn Kenner PCP.  Placed in your inbox.

## 2022-08-06 NOTE — Telephone Encounter (Signed)
Reviewed, depakote level is low but expected, and his LFTs are fine.

## 2022-08-21 DIAGNOSIS — R42 Dizziness and giddiness: Secondary | ICD-10-CM | POA: Diagnosis not present

## 2022-08-21 DIAGNOSIS — H6122 Impacted cerumen, left ear: Secondary | ICD-10-CM | POA: Diagnosis not present

## 2022-09-04 ENCOUNTER — Other Ambulatory Visit: Payer: Self-pay | Admitting: Neurology

## 2022-09-04 DIAGNOSIS — R42 Dizziness and giddiness: Secondary | ICD-10-CM | POA: Diagnosis not present

## 2022-09-04 DIAGNOSIS — H903 Sensorineural hearing loss, bilateral: Secondary | ICD-10-CM | POA: Diagnosis not present

## 2022-09-16 ENCOUNTER — Encounter (HOSPITAL_COMMUNITY): Payer: Self-pay | Admitting: Hematology and Oncology

## 2022-09-23 ENCOUNTER — Encounter: Payer: Self-pay | Admitting: Neurology

## 2022-09-23 ENCOUNTER — Ambulatory Visit (INDEPENDENT_AMBULATORY_CARE_PROVIDER_SITE_OTHER): Payer: Medicare Other | Admitting: Neurology

## 2022-09-23 ENCOUNTER — Encounter (HOSPITAL_COMMUNITY): Payer: Self-pay | Admitting: Hematology and Oncology

## 2022-09-23 VITALS — BP 123/75 | HR 67 | Ht 74.0 in | Wt 236.0 lb

## 2022-09-23 DIAGNOSIS — G629 Polyneuropathy, unspecified: Secondary | ICD-10-CM

## 2022-09-23 DIAGNOSIS — G3184 Mild cognitive impairment, so stated: Secondary | ICD-10-CM

## 2022-09-23 DIAGNOSIS — G40909 Epilepsy, unspecified, not intractable, without status epilepticus: Secondary | ICD-10-CM | POA: Diagnosis not present

## 2022-09-23 NOTE — Progress Notes (Unsigned)
[facility]  PATIENT: William Burns DOB: 07/17/1945  REQUESTING CLINICIAN: Benita Stabile, MD HISTORY FROM: Patient and spouse  REASON FOR VISIT: Hx of seizure and worsening memory    HISTORICAL  CHIEF COMPLAINT:  Chief Complaint  Patient presents with   Follow-up    Rm 12. Accompanied by wife. Denies any new seizure activity. C/o foot pain.   INTERVAL HISTORY 09/23/2022:  Patient presents today for follow-up, he is accompanied by wife.  Last visit was 6 months ago.  Since then he has been doing well.  He denies any seizure or seizure-like activity, compliant with his depakote. He reports that he was recently diagnosed with a blood clot but it was in the superficial vein, he was on Eliquis, completed treatment and now on aspirin alone.  He still complaining of memory issues.  He was recently diagnosed with hearing loss and will be evaluated for hearing aids.  He reports that his main issue is remembering names of people that he knew before.  Wife reported he gets active aggravated when he does not remember the names.  Both parents have dementia.  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 When it comes to his peripheral neuropathy, at last visit I have advised him to increase the gabapentin to 200 mg up to 3 times a day.  They have not proceeded with the increase, he is still on Gabapentin 200 mg nightly.   INTERVAL HISTORY 03/20/2022:  Patient presents today for follow-up, he is accompanied by his wife.  Last visit was in January, since then he has not had episodes of seizure or seizure-like activity.  He is compliant with the Depakote. At his last visit with a podiatrist he complained of numbness in the bilateral feet.  Due to that he was referred to neurology.  He reports the numbness and pain started about 6 months ago.  He started at the toes and all the way to the ankle.  Described the pain as aching, denies any burning or stabbing pain.  He does have history of  bilateral hammertoe with pain.  He denies any recent falls. He was started on Gabapentin 100 mg, uptitrated to 200 mg at night with minimal relief.    HISTORY OF PRESENT ILLNESS:  This is a 77 year old gentleman with past medical history of colon cancer, BPH, seizures and worsening memory who is presenting to establish care.  Patient reported he had his first lifetime seizure on June 25, 2020.  Per wife he was around 12:45 at night when she noted patient starts shaking on the bed, all body shaking, she could not stop it.  EMS was called and patient was taken to the ED.  He was noted to have postictal confusion.  No reported tongue biting or urinary incontinence.  He was back to his normal self and discharged home with neurology follow-up.  Patient reported he follow-up with a neurologist, had a EEG, was told that the EEG was abnormal (showed seizures) and he was started on antiseizure medication. He was started on Depakote 250 mg twice daily.  Since then he has been compliant with the Depakote, denies any additional seizures but has not followed up with neurology for the past year.  In terms of his memory problem, he describes issues with recognizing people face, trouble with remembering people's names,  wife reports that he will asked the same questions over and over and sometimes repeat himself too.  Wife also has to remind him of  events.  Other than that, he is still very active, he working in his farm, he still drives, denies any accident, denies being lost in familiar places.  He is currently on Namenda 5 mg twice daily. Wife handles all finances, she has been doing it for many years.   OTHER MEDICAL CONDITIONS: Colon cancer, BPH, seizure disorder, memory problem,   REVIEW OF SYSTEMS: Full 14 system review of systems performed and negative with exception of: As noted in HPI  ALLERGIES: No Known Allergies  HOME MEDICATIONS: Outpatient Medications Prior to Visit  Medication Sig Dispense  Refill   aspirin EC 81 MG tablet Take 81 mg by mouth daily. Swallow whole.     Cholecalciferol (VITAMIN D3) 125 MCG (5000 UT) TABS Take by mouth in the morning.     divalproex (DEPAKOTE) 250 MG DR tablet Take 250 mg by mouth in the morning and at bedtime.     donepezil (ARICEPT) 5 MG tablet Take 1 tablet (5 mg total) by mouth at bedtime. (Patient taking differently: Take 5 mg by mouth every other day.) 30 tablet 6   gabapentin (NEURONTIN) 100 MG capsule Take 2 capsules (200 mg total) by mouth 3 (three) times daily. (Patient taking differently: Take 100 mg by mouth 2 (two) times daily.) 540 capsule 2   Lactobacillus (ACIDOPHILUS PO) Take 1 capsule by mouth in the morning. Now Pb8 Acidophilus     loratadine (CLARITIN) 10 MG tablet Take 10 mg by mouth in the morning.     memantine (NAMENDA) 10 MG tablet TAKE (1) TABLET BY MOUTH TWICE DAILY. 60 tablet 0   Multiple Vitamin (MULTIVITAMIN WITH MINERALS) TABS tablet Take 1 tablet by mouth in the morning. Centrum Silver for Men 50+     Omega-3 Fatty Acids (FISH OIL) 1200 MG CAPS Take 1,200 mg by mouth in the morning.     pantoprazole (PROTONIX) 40 MG tablet TAKE 1 TABLET BY MOUTH ONCE DAILY. 90 tablet 3   ELIQUIS 5 MG TABS tablet Take 5 mg by mouth 2 (two) times daily.     No facility-administered medications prior to visit.    PAST MEDICAL HISTORY: Past Medical History:  Diagnosis Date   BPH (benign prostatic hyperplasia)    Cancer    colon   Cataract    Colonic mass 07/03/2011   Difficult intubation    extra long tube for intub per wife; states "his neck is longer than regular people" and slow to wake up    Erosive gastritis 07/03/2011   GERD (gastroesophageal reflux disease)    History of kidney stones    Kidney stones 2011   Renal calculus    Tooth infection 07/03/2011    PAST SURGICAL HISTORY: Past Surgical History:  Procedure Laterality Date   CATARACT EXTRACTION W/PHACO Left 08/23/2012   Procedure: CATARACT EXTRACTION PHACO AND  INTRAOCULAR LENS PLACEMENT (IOC);  Surgeon: Gemma Payor, MD;  Location: [facility];  Service: Ophthalmology;  Laterality: Left;  CDE 16.64   CATARACT EXTRACTION W/PHACO Right 06/09/2013   Procedure: CATARACT EXTRACTION PHACO AND INTRAOCULAR LENS PLACEMENT (IOC) CDE=10.86;  Surgeon: Gemma Payor, MD;  Location: [facility];  Service: Ophthalmology;  Laterality: Right;   COLON SURGERY     COLONOSCOPY  07/03/2011   Procedure: COLONOSCOPY;  Surgeon: Malissa Hippo, MD;  [facility]: [facility];  Service: Endoscopy;  Laterality: N/A;   COLONOSCOPY N/A 09/16/2012   Procedure: COLONOSCOPY;  Surgeon: Malissa Hippo, MD;  [facility];  Service: Endoscopy;  Laterality: N/A;  930   COLONOSCOPY N/A 12/05/2015   Procedure: COLONOSCOPY;  Surgeon: Malissa Hippo, MD;  [facility]: [facility];  Service: Endoscopy;  Laterality: N/A;  1200   COLONOSCOPY WITH PROPOFOL N/A 02/06/2021   Procedure: COLONOSCOPY WITH PROPOFOL;  Surgeon: Malissa Hippo, MD;  [facility]: [facility];  Service: Endoscopy;  Laterality: N/A;  12:50   CYSTOSCOPY  07/09/2011   Procedure: CYSTOSCOPY FLEXIBLE;  Surgeon: Ky Barban, MD;  Location: [facility];  Service: Urology;  Laterality: N/A;   CYSTOSCOPY WITH LITHOLAPAXY  07/11/2011   Procedure: CYSTOSCOPY WITH LITHOLAPAXY;  Surgeon: Ky Barban, MD;  Location: [facility];  Service: Urology;  Laterality: N/A;   ESOPHAGOGASTRODUODENOSCOPY  07/03/2011   Procedure: ESOPHAGOGASTRODUODENOSCOPY (EGD);  Surgeon: Malissa Hippo, MD;  [facility];  Service: Endoscopy;  Laterality: N/A;   HERNIA REPAIR  2008    umbilical    INGUINAL HERNIA REPAIR Right 05/04/2017   Procedure: HERNIA REPAIR INGUINAL ADULT WITH MESH;  Surgeon: Franky Macho, MD;  Location: [facility];  Service: General;  Laterality: Right;   PARTIAL COLECTOMY  07/04/2011   Procedure: PARTIAL COLECTOMY;  Surgeon: Dalia Heading, MD;  Location: [facility];  Service: General;  Laterality: N/A;   POLYPECTOMY  02/06/2021   Procedure:  POLYPECTOMY;  Surgeon: Malissa Hippo, MD;  [facility];  Service: Endoscopy;;   Prostage cancer     PROSTATE BIOPSY     ROBOT ASSISTED LAPAROSCOPIC RADICAL PROSTATECTOMY Bilateral 01/19/2015   Procedure: ROBOTIC ASSISTED LAPAROSCOPIC RADICAL PROSTATECTOMY AND BILATERAL PELVIC LYMPH NODE DISSECTION, LAPARASCOPIC  LYSIS OF ADHESIONS;  Surgeon: Crist Fat, MD;  Location: [facility];  Service: Urology;  Laterality: Bilateral;   TRANSURETHRAL RESECTION OF PROSTATE  07/11/2011   Procedure: TRANSURETHRAL RESECTION OF THE PROSTATE (TURP);  Surgeon: Ky Barban, MD;  Location: [facility];  Service: Urology;  Laterality: N/A;    FAMILY HISTORY: Family History  Problem Relation Age of Onset   Alzheimer's disease Mother    Alzheimer's disease Father    Aneurysm Brother        brain   Anuerysm Brother    Healthy Son    Healthy Son    Colon cancer Neg Hx     SOCIAL HISTORY: Social History   Socioeconomic History   Marital status: Married    Spouse name: Zalman Hull   Number of children: 2   Years of education: 12   Highest education level: 12th grade  Occupational History   Occupation: farmer    Comment: tobacco; cattle; poultry  Tobacco Use   Smoking status: Former    Packs/day: 2.00    Years: 25.00    Additional pack years: 0.00    Total pack years: 50.00    Types: Cigarettes    Quit date: 06/02/1989    Years since quitting: 33.3    Passive exposure: Past   Smokeless tobacco: Never  Vaping Use   Vaping Use: Never used  Substance and Sexual Activity   Alcohol use: No   Drug use: No   Sexual activity: Not Currently    Partners: Female  Other Topics Concern   Not on file  Social History Narrative   Right handed    Caffeine- none    Lives at home with wife Cordelia Pen    Social Determinants of Health   Financial Resource Strain: Low Risk  (01/01/2022)   Overall Financial Resource Strain (CARDIA)    Difficulty of Paying Living Expenses: Not hard  at all  Food  Insecurity: No Food Insecurity (01/01/2022)   Hunger Vital Sign    Worried About Running Out of Food in the Last Year: Never true    Ran Out of Food in the Last Year: Never true  Transportation Needs: No Transportation Needs (01/01/2022)   PRAPARE - Administrator, Civil Service (Medical): No    Lack of Transportation (Non-Medical): No  Physical Activity: Inactive (01/01/2022)   Exercise Vital Sign    Days of Exercise per Week: 0 days    Minutes of Exercise per Session: 0 min  Stress: No Stress Concern Present (01/01/2022)   [employer] of Occupational Health - Occupational Stress Questionnaire    Feeling of Stress : Only a little  Social Connections: Socially Integrated (01/01/2022)   Social Connection and Isolation Panel [NHANES]    Frequency of Communication with Friends and Family: More than three times a week    Frequency of Social Gatherings with Friends and Family: More than three times a week    Attends Religious Services: More than 4 times per year    Active Member of [employer] or Organizations: Yes    Attends Engineer, structural: More than 4 times per year    Marital Status: Married  Catering manager Violence: Not At Risk (01/01/2022)   Humiliation, Afraid, Rape, and Kick questionnaire    Fear of Current or Ex-Partner: No    Emotionally Abused: No    Physically Abused: No    Sexually Abused: No    PHYSICAL EXAM  GENERAL EXAM/CONSTITUTIONAL: Vitals:  Vitals:   09/23/22 1410  BP: 123/75  Pulse: 67  Weight: 236 lb (107 kg)  Height: 6\' 2"  (1.88 m)    Body mass index is 30.3 kg/m. Wt Readings from Last 3 Encounters:  09/23/22 236 lb (107 kg)  06/25/22 236 lb (107 kg)  05/04/22 237 lb (107.5 kg)   Patient is in no distress; well developed, nourished and groomed; neck is supple  MUSCULOSKELETAL: Gait, strength, tone, movements noted in Neurologic exam below  NEUROLOGIC: MENTAL STATUS:      No data to display         awake, alert, oriented  to person, place and time recent and remote memory intact normal attention and concentration language fluent, comprehension intact, naming intact fund of knowledge appropriate  CRANIAL NERVE:  2nd, 3rd, 4th, 6th - visual fields full to confrontation, extraocular muscles intact, no nystagmus 5th - facial sensation symmetric 7th - facial strength symmetric 8th - hearing intact 9th - palate elevates symmetrically, uvula midline 11th - shoulder shrug symmetric 12th - tongue protrusion midline  MOTOR:  normal bulk and tone, full strength in the BUE, BLE  SENSORY:  Decrease sensation to pinprick and vibration to bilateral feet up to ankle. Normal proprioception.   COORDINATION:  finger-nose-finger, fine finger movements normal  GAIT/STATION:  normal   DIAGNOSTIC DATA (LABS, IMAGING, TESTING) - I reviewed patient records, labs, notes, testing and imaging myself where available.  Lab Results  Component Value Date   WBC 6.5 03/20/2022   HGB 13.3 03/20/2022   HCT 39.5 03/20/2022   MCV 90 03/20/2022   PLT 287 03/20/2022      Component Value Date/Time   NA 144 03/20/2022 1339   K 4.7 03/20/2022 1339   CL 104 03/20/2022 1339   CO2 29 03/20/2022 1339   GLUCOSE 102 (H) 03/20/2022 1339   GLUCOSE 99 02/12/2021 1115   BUN 11 03/20/2022 1339  CREATININE 0.94 03/20/2022 1339   CALCIUM 9.2 03/20/2022 1339   PROT 6.4 03/20/2022 1339   ALBUMIN 4.2 03/20/2022 1339   AST 22 03/20/2022 1339   ALT 11 03/20/2022 1339   ALKPHOS 79 03/20/2022 1339   BILITOT 0.2 03/20/2022 1339   GFRNONAA >60 02/12/2021 1115   GFRAA >60 02/07/2020 1123   Lab Results  Component Value Date   CHOL 167 01/26/2015   HDL 26 (L) 01/26/2015   LDLCALC 111 (H) 01/26/2015   TRIG 151 (H) 01/26/2015   CHOLHDL 6.4 01/26/2015   Lab Results  Component Value Date   HGBA1C 5.4 03/20/2022   Lab Results  Component Value Date   VITAMINB12 >2000 (H) 03/20/2022   Lab Results  Component Value Date   TSH 2.970  03/20/2022    Head CT 06/25/2020 1. No acute intracranial abnormalities. 2. Chronic atrophy and small vessel ischemic changes.   ASSESSMENT AND PLAN  77 y.o. year old male with history of colon cancer, BPH, seizure disorder, MCI who is presenting for follow up for his peripheral neuropathy. He was already started on Gabapentin and currently taking Gabapentin 200 mg nightly, again advise him to increase Gabapentin as indicated.   In terms of His MCI, continue both Namenda and Aricept, and I will obtain ATN profile to look for Alzheimer disease biomarker's. Continue Depakote for the seizures. Follow up in a year    1. Mild cognitive impairment   2. Neuropathy   3. Seizure disorder        Patient Instructions  Continue gabapentin 200 mg nightly, can increase to 200 mg 3 times daily Continue Depakote 250 mg twice daily Continue with Namenda 10 mg twice daily and Aricept 5 mg nightly, will obtain a ATN profile to look for Alzheimer disease biomarker Follow-up in 6 months or sooner if worse.  There are well-accepted and sensible ways to reduce risk for Alzheimers disease and other degenerative brain disorders .  Exercise Daily Walk A daily 20 minute walk should be part of your routine. Disease related apathy can be a significant roadblock to exercise and the only way to overcome this is to make it a daily routine and perhaps have a reward at the end (something your loved one loves to eat or drink perhaps) or a personal trainer coming to the home can also be very useful. Most importantly, the patient is much more likely to exercise if the caregiver / spouse does it with him/her. In general a structured, repetitive schedule is best.  General Health: Any diseases which effect your body will effect your brain such as a pneumonia, urinary infection, blood clot, heart attack or stroke. Keep contact with your primary care doctor for regular follow ups.  Sleep. A good nights sleep is healthy for  the brain. Seven hours is recommended. If you have insomnia or poor sleep habits we can give you some instructions. If you have sleep apnea wear your mask.  Diet: Eating a heart healthy diet is also a good idea; fish and poultry instead of red meat, nuts (mostly non-peanuts), vegetables, fruits, olive oil or canola oil (instead of butter), minimal salt (use other spices to flavor foods), whole grain rice, bread, cereal and pasta and wine in moderation.Research is now showing that the MIND diet, which is a combination of The Mediterranean diet and the DASH diet, is beneficial for cognitive processing and longevity. Information about this diet can be found in The MIND Diet, a book by Alonna Minium, MS, RDN,  and online at [employer].es  [employer], Power of [employer] and Advance Directives: You should consider putting legal safeguards in place with regard to financial and medical decision making. While the spouse always has power of attorney for medical and financial issues in the absence of any form, you should consider what you want in case the spouse / caregiver is no longer around or capable of making decisions.     Orders Placed This Encounter  Procedures   ATN PROFILE    No orders of the defined types were placed in this encounter.   Return in about 6 months (around 03/25/2023).    Windell Norfolk, MD 09/24/2022, 6:22 PM  [facility] [address], [address] [place], [place] [address] [phone]

## 2022-09-24 NOTE — Patient Instructions (Addendum)
Continue gabapentin 200 mg nightly, can increase to 200 mg 3 times daily Continue Depakote 250 mg twice daily Continue with Namenda 10 mg twice daily and Aricept 5 mg nightly, will obtain a ATN profile to look for Alzheimer disease biomarker Follow-up in 6 months or sooner if worse.  There are well-accepted and sensible ways to reduce risk for Alzheimers disease and other degenerative brain disorders .  Exercise Daily Walk A daily 20 minute walk should be part of your routine. Disease related apathy can be a significant roadblock to exercise and the only way to overcome this is to make it a daily routine and perhaps have a reward at the end (something your loved one loves to eat or drink perhaps) or a personal trainer coming to the home can also be very useful. Most importantly, the patient is much more likely to exercise if the caregiver / spouse does it with him/her. In general a structured, repetitive schedule is best.  General Health: Any diseases which effect your body will effect your brain such as a pneumonia, urinary infection, blood clot, heart attack or stroke. Keep contact with your primary care doctor for regular follow ups.  Sleep. A good nights sleep is healthy for the brain. Seven hours is recommended. If you have insomnia or poor sleep habits we can give you some instructions. If you have sleep apnea wear your mask.  Diet: Eating a heart healthy diet is also a good idea; fish and poultry instead of red meat, nuts (mostly non-peanuts), vegetables, fruits, olive oil or canola oil (instead of butter), minimal salt (use other spices to flavor foods), whole grain rice, bread, cereal and pasta and wine in moderation.Research is now showing that the MIND diet, which is a combination of The Mediterranean diet and the DASH diet, is beneficial for cognitive processing and longevity. Information about this diet can be found in The MIND Diet, a book by Alonna Minium, MS, RDN, and online at  [employer].es  Finances, Power of [address] and Advance Directives: You should consider putting legal safeguards in place with regard to financial and medical decision making. While the spouse always has power of attorney for medical and financial issues in the absence of any form, you should consider what you want in case the spouse / caregiver is no longer around or capable of making decisions.

## 2022-09-26 LAB — ATN PROFILE
A -- Beta-amyloid 42/40 Ratio: 0.103 (ref >0.102)
Beta-amyloid 40: 176.32 pg/mL
Beta-amyloid 42: 18.14 pg/mL
N -- NfL, Plasma: 3.6 pg/mL (ref 0.00–7.64)
T -- p-tau181: 2.11 pg/mL — ABNORMAL HIGH (ref 0.00–0.97)

## 2022-11-01 ENCOUNTER — Other Ambulatory Visit: Payer: Self-pay | Admitting: Neurology

## 2022-11-29 ENCOUNTER — Other Ambulatory Visit: Payer: Self-pay | Admitting: Neurology

## 2023-01-03 ENCOUNTER — Other Ambulatory Visit: Payer: Self-pay | Admitting: Neurology

## 2023-01-14 DIAGNOSIS — C44229 Squamous cell carcinoma of skin of left ear and external auricular canal: Secondary | ICD-10-CM | POA: Diagnosis not present

## 2023-01-14 DIAGNOSIS — L570 Actinic keratosis: Secondary | ICD-10-CM | POA: Diagnosis not present

## 2023-01-14 DIAGNOSIS — X32XXXD Exposure to sunlight, subsequent encounter: Secondary | ICD-10-CM | POA: Diagnosis not present

## 2023-01-21 ENCOUNTER — Ambulatory Visit (INDEPENDENT_AMBULATORY_CARE_PROVIDER_SITE_OTHER): Payer: Medicare Other | Admitting: Podiatry

## 2023-01-21 ENCOUNTER — Encounter: Payer: Self-pay | Admitting: Podiatry

## 2023-01-21 DIAGNOSIS — L539 Erythematous condition, unspecified: Secondary | ICD-10-CM | POA: Diagnosis not present

## 2023-01-21 DIAGNOSIS — L600 Ingrowing nail: Secondary | ICD-10-CM | POA: Diagnosis not present

## 2023-01-21 MED ORDER — DOXYCYCLINE HYCLATE 100 MG PO TABS: 100.0000 mg | ORAL_TABLET | Freq: Two times a day (BID) | ORAL | 0 refills | Status: DC

## 2023-01-21 NOTE — Progress Notes (Signed)
Subjective:  Patient ID: William Burns, male    DOB: Jul 06, 1945,  MRN: [id number]  Chief Complaint  Patient presents with   Difficulty Walking    RM12: Patient is here for right ankle pain, right hallux possible infection with ingrown    77 y.o. male presents with the above complaint.  Patient presents with right hallux medial border ingrown painful to touch is progressive gotten worse worse with ambulation worse with pressure he would like to have it removed he has not seen it was prior to see me there is also some erythema present he is currently not on antibiotics.  He denies any other acute issues pain scale 7 out of 10 dull aching nature   Review of Systems: Negative except as noted in the HPI. Denies N/V/F/Ch.  Past Medical History:  Diagnosis Date   BPH (benign prostatic hyperplasia)    Cancer (HCC)    colon   Cataract    Colonic mass 07/03/2011   Difficult intubation    extra long tube for intub per wife; states "his neck is longer than regular people" and slow to wake up    Erosive gastritis 07/03/2011   GERD (gastroesophageal reflux disease)    History of kidney stones    Kidney stones 2011   Renal calculus    Tooth infection 07/03/2011    Current Outpatient Medications:    aspirin EC 81 MG tablet, Take 81 mg by mouth daily. Swallow whole., Disp: , Rfl:    Cholecalciferol (VITAMIN D3) 125 MCG (5000 UT) TABS, Take by mouth in the morning., Disp: , Rfl:    divalproex (DEPAKOTE) 250 MG DR tablet, Take 250 mg by mouth in the morning and at bedtime., Disp: , Rfl:    donepezil (ARICEPT) 5 MG tablet, Take 1 tablet (5 mg total) by mouth at bedtime. (Patient taking differently: Take 5 mg by mouth every other day.), Disp: 30 tablet, Rfl: 6   doxycycline (VIBRA-TABS) 100 MG tablet, Take 1 tablet (100 mg total) by mouth 2 (two) times daily., Disp: 20 tablet, Rfl: 0   Lactobacillus (ACIDOPHILUS PO), Take 1 capsule by mouth in the morning. Now Pb8 Acidophilus, Disp: , Rfl:     loratadine (CLARITIN) 10 MG tablet, Take 10 mg by mouth in the morning., Disp: , Rfl:    memantine (NAMENDA) 10 MG tablet, TAKE (1) TABLET BY MOUTH TWICE DAILY., Disp: 60 tablet, Rfl: 0   Multiple Vitamin (MULTIVITAMIN WITH MINERALS) TABS tablet, Take 1 tablet by mouth in the morning. Centrum Silver for Men 50+, Disp: , Rfl:    Omega-3 Fatty Acids (FISH OIL) 1200 MG CAPS, Take 1,200 mg by mouth in the morning., Disp: , Rfl:    pantoprazole (PROTONIX) 40 MG tablet, TAKE 1 TABLET BY MOUTH ONCE DAILY., Disp: 90 tablet, Rfl: 3   gabapentin (NEURONTIN) 100 MG capsule, Take 2 capsules (200 mg total) by mouth 3 (three) times daily. (Patient taking differently: Take 100 mg by mouth 2 (two) times daily.), Disp: 540 capsule, Rfl: 2  Social History   Tobacco Use  Smoking Status Former   Current packs/day: 0.00   Average packs/day: 2.0 packs/day for 25.0 years (50.0 ttl pk-yrs)   Types: Cigarettes   Start date: 06/02/1964   Quit date: 06/02/1989   Years since quitting: 33.6   Passive exposure: Past  Smokeless Tobacco Never    No Known Allergies Objective:  There were no vitals filed for this visit. There is no height or weight on file to calculate  BMI. Constitutional Well developed. Well nourished.  Vascular Dorsalis pedis pulses palpable bilaterally. Posterior tibial pulses palpable bilaterally. Capillary refill normal to all digits.  No cyanosis or clubbing noted. Pedal hair growth normal.  Neurologic Normal speech. Oriented to person, place, and time. Epicritic sensation to light touch grossly present bilaterally.  Dermatologic Painful ingrowing nail at medial nail borders of the hallux nail right. No other open wounds. No skin lesions.  Orthopedic: Normal joint ROM without pain or crepitus bilaterally. No visible deformities. No bony tenderness.   Radiographs: None Assessment:   1. Ingrown toenail of right foot   2. Erythema    Plan:  Patient was evaluated and treated and all  questions answered.  Ingrown Nail, right -Patient elects to proceed with minor surgery to remove ingrown toenail removal today. Consent reviewed and signed by patient. -Ingrown nail excised. See procedure note. -Educated on post-procedure care including soaking. Written instructions provided and reviewed. -Patient to follow up in 2 weeks for nail check. -Doxycycline was dispensed for skin and soft tissue prophylaxis  Procedure: Excision of Ingrown Toenail Location: Right 1st toe medial nail borders. Anesthesia: Lidocaine 1% plain; 1.5 mL and Marcaine 0.5% plain; 1.5 mL, digital block. Skin Prep: Betadine. Dressing: Silvadene; telfa; dry, sterile, compression dressing. Technique: Following skin prep, the toe was exsanguinated and a tourniquet was secured at the base of the toe. The affected nail border was freed, split with a nail splitter, and excised. Chemical matrixectomy was then performed with phenol and irrigated out with alcohol. The tourniquet was then removed and sterile dressing applied. Disposition: Patient tolerated procedure well. Patient to return in 2 weeks for follow-up.   No follow-ups on file.

## 2023-01-30 DIAGNOSIS — E538 Deficiency of other specified B group vitamins: Secondary | ICD-10-CM | POA: Diagnosis not present

## 2023-01-30 DIAGNOSIS — E782 Mixed hyperlipidemia: Secondary | ICD-10-CM | POA: Diagnosis not present

## 2023-01-30 DIAGNOSIS — G40909 Epilepsy, unspecified, not intractable, without status epilepticus: Secondary | ICD-10-CM | POA: Diagnosis not present

## 2023-01-30 DIAGNOSIS — R7303 Prediabetes: Secondary | ICD-10-CM | POA: Diagnosis not present

## 2023-02-05 ENCOUNTER — Other Ambulatory Visit: Payer: Self-pay | Admitting: Neurology

## 2023-02-05 DIAGNOSIS — N1831 Chronic kidney disease, stage 3a: Secondary | ICD-10-CM | POA: Diagnosis not present

## 2023-02-05 DIAGNOSIS — R7303 Prediabetes: Secondary | ICD-10-CM | POA: Diagnosis not present

## 2023-02-05 DIAGNOSIS — Z23 Encounter for immunization: Secondary | ICD-10-CM | POA: Diagnosis not present

## 2023-02-05 DIAGNOSIS — E669 Obesity, unspecified: Secondary | ICD-10-CM | POA: Diagnosis not present

## 2023-02-05 DIAGNOSIS — H8113 Benign paroxysmal vertigo, bilateral: Secondary | ICD-10-CM | POA: Diagnosis not present

## 2023-02-05 DIAGNOSIS — R194 Change in bowel habit: Secondary | ICD-10-CM | POA: Diagnosis not present

## 2023-02-05 DIAGNOSIS — H9193 Unspecified hearing loss, bilateral: Secondary | ICD-10-CM | POA: Diagnosis not present

## 2023-02-05 DIAGNOSIS — R4189 Other symptoms and signs involving cognitive functions and awareness: Secondary | ICD-10-CM | POA: Diagnosis not present

## 2023-02-05 DIAGNOSIS — E538 Deficiency of other specified B group vitamins: Secondary | ICD-10-CM | POA: Diagnosis not present

## 2023-02-05 DIAGNOSIS — G40909 Epilepsy, unspecified, not intractable, without status epilepticus: Secondary | ICD-10-CM | POA: Diagnosis not present

## 2023-02-05 DIAGNOSIS — K219 Gastro-esophageal reflux disease without esophagitis: Secondary | ICD-10-CM | POA: Diagnosis not present

## 2023-02-05 DIAGNOSIS — E782 Mixed hyperlipidemia: Secondary | ICD-10-CM | POA: Diagnosis not present

## 2023-02-25 DIAGNOSIS — Z85828 Personal history of other malignant neoplasm of skin: Secondary | ICD-10-CM | POA: Diagnosis not present

## 2023-02-25 DIAGNOSIS — X32XXXD Exposure to sunlight, subsequent encounter: Secondary | ICD-10-CM | POA: Diagnosis not present

## 2023-02-25 DIAGNOSIS — Z08 Encounter for follow-up examination after completed treatment for malignant neoplasm: Secondary | ICD-10-CM | POA: Diagnosis not present

## 2023-02-25 DIAGNOSIS — L570 Actinic keratosis: Secondary | ICD-10-CM | POA: Diagnosis not present

## 2023-03-09 ENCOUNTER — Other Ambulatory Visit: Payer: Self-pay

## 2023-03-09 MED ORDER — MEMANTINE HCL 10 MG PO TABS: 10.0000 mg | ORAL_TABLET | Freq: Two times a day (BID) | ORAL | 1 refills | Status: DC

## 2023-03-19 ENCOUNTER — Encounter: Payer: Self-pay | Admitting: Neurology

## 2023-03-19 ENCOUNTER — Ambulatory Visit (INDEPENDENT_AMBULATORY_CARE_PROVIDER_SITE_OTHER): Payer: Medicare Other | Admitting: Neurology

## 2023-03-19 VITALS — BP 139/70 | HR 76 | Ht 74.0 in | Wt 246.0 lb

## 2023-03-19 DIAGNOSIS — G40909 Epilepsy, unspecified, not intractable, without status epilepticus: Secondary | ICD-10-CM

## 2023-03-19 DIAGNOSIS — G629 Polyneuropathy, unspecified: Secondary | ICD-10-CM | POA: Diagnosis not present

## 2023-03-19 DIAGNOSIS — G3184 Mild cognitive impairment, so stated: Secondary | ICD-10-CM

## 2023-03-19 MED ORDER — LIDOCAINE-PRILOCAINE 2.5-2.5 % EX CREA: 1.0000 | TOPICAL_CREAM | CUTANEOUS | 2 refills | Status: DC | PRN

## 2023-03-19 MED ORDER — GABAPENTIN 300 MG PO CAPS: 300.0000 mg | ORAL_CAPSULE | Freq: Three times a day (TID) | ORAL | 2 refills | Status: DC

## 2023-03-19 NOTE — Progress Notes (Signed)
[facility]  PATIENT: William Burns DOB: 1946/04/24  REQUESTING CLINICIAN: Benita Stabile, MD HISTORY FROM: Patient and spouse  REASON FOR VISIT: Hx of seizure and worsening memory    HISTORICAL  CHIEF COMPLAINT:  Chief Complaint  Patient presents with   Seizures    Rm12, wife present, UJ:WJXB sz 2 years ago, no problems/concerns   INTERVAL HISTORY 03/19/2023 Patient presents today for follow-up, he is accompanied by wife.  Last visit was in April since then she has been doing well, she denies any seizure or seizure activity.  He is compliant with the Depakote.  His main complaint today is his neuropathy.  He reports at the end of the day his pain is high.  He takes the gabapentin 300 mg in the afternoon and 300 mg at bedtime.   In terms of his memory, he is stable, his ATN profile was negative for Alzheimer disease biomarkers.   INTERVAL HISTORY 09/23/2022:  Patient presents today for follow-up, he is accompanied by wife.  Last visit was 6 months ago.  Since then he has been doing well.  He denies any seizure or seizure-like activity, compliant with his depakote. He reports that he was recently diagnosed with a blood clot but it was in the superficial vein, he was on Eliquis, completed treatment and now on aspirin alone.  He still complaining of memory issues.  He was recently diagnosed with hearing loss and will be evaluated for hearing aids.  He reports that his main issue is remembering names of people that he knew before.  Wife reported he gets active aggravated when he does not remember the names.  Both parents have dementia.  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 When it comes to his peripheral neuropathy, at last visit I have advised him to increase the gabapentin to 200 mg up to 3 times a day.  They have not proceeded with the increase, he is still on Gabapentin 200 mg nightly.   INTERVAL HISTORY 03/20/2022:  Patient presents today for follow-up, he is  accompanied by his wife.  Last visit was in January, since then he has not had episodes of seizure or seizure-like activity.  He is compliant with the Depakote. At his last visit with a podiatrist he complained of numbness in the bilateral feet.  Due to that he was referred to neurology.  He reports the numbness and pain started about 6 months ago.  He started at the toes and all the way to the ankle.  Described the pain as aching, denies any burning or stabbing pain.  He does have history of bilateral hammertoe with pain.  He denies any recent falls. He was started on Gabapentin 100 mg, uptitrated to 200 mg at night with minimal relief.    HISTORY OF PRESENT ILLNESS:  This is a 77 year old gentleman with past medical history of colon cancer, BPH, seizures and worsening memory who is presenting to establish care.  Patient reported he had his first lifetime seizure on June 25, 2020.  Per wife he was around 12:45 at night when she noted patient starts shaking on the bed, all body shaking, she could not stop it.  EMS was called and patient was taken to the ED.  He was noted to have postictal confusion.  No reported tongue biting or urinary incontinence.  He was back to his normal self and discharged home with neurology follow-up.  Patient reported he follow-up with a neurologist, had a EEG, was  told that the EEG was abnormal (showed seizures) and he was started on antiseizure medication. He was started on Depakote 250 mg twice daily.  Since then he has been compliant with the Depakote, denies any additional seizures but has not followed up with neurology for the past year.  In terms of his memory problem, he describes issues with recognizing people face, trouble with remembering people's names,  wife reports that he will asked the same questions over and over and sometimes repeat himself too.  Wife also has to remind him of events.  Other than that, he is still very active, he working in his farm, he still  drives, denies any accident, denies being lost in familiar places.  He is currently on Namenda 5 mg twice daily. Wife handles all finances, she has been doing it for many years.   OTHER MEDICAL CONDITIONS: Colon cancer, BPH, seizure disorder, memory problem,   REVIEW OF SYSTEMS: Full 14 system review of systems performed and negative with exception of: As noted in HPI  ALLERGIES: No Known Allergies  HOME MEDICATIONS: Outpatient Medications Prior to Visit  Medication Sig Dispense Refill   aspirin EC 81 MG tablet Take 81 mg by mouth daily. Swallow whole.     Cholecalciferol (VITAMIN D3) 125 MCG (5000 UT) TABS Take by mouth in the morning.     divalproex (DEPAKOTE) 250 MG DR tablet Take 250 mg by mouth in the morning and at bedtime.     donepezil (ARICEPT) 5 MG tablet Take 1 tablet (5 mg total) by mouth at bedtime. (Patient taking differently: Take 5 mg by mouth every other day.) 30 tablet 6   doxycycline (VIBRA-TABS) 100 MG tablet Take 1 tablet (100 mg total) by mouth 2 (two) times daily. 20 tablet 0   Lactobacillus (ACIDOPHILUS PO) Take 1 capsule by mouth in the morning. Now Pb8 Acidophilus     loratadine (CLARITIN) 10 MG tablet Take 10 mg by mouth in the morning.     memantine (NAMENDA) 10 MG tablet Take 1 tablet (10 mg total) by mouth 2 (two) times daily. 180 tablet 1   Multiple Vitamin (MULTIVITAMIN WITH MINERALS) TABS tablet Take 1 tablet by mouth in the morning. Centrum Silver for Men 50+     Omega-3 Fatty Acids (FISH OIL) 1200 MG CAPS Take 1,200 mg by mouth in the morning.     pantoprazole (PROTONIX) 40 MG tablet TAKE 1 TABLET BY MOUTH ONCE DAILY. 90 tablet 3   gabapentin (NEURONTIN) 100 MG capsule Take 2 capsules (200 mg total) by mouth 3 (three) times daily. (Patient taking differently: Take 100 mg by mouth 2 (two) times daily.) 540 capsule 2   No facility-administered medications prior to visit.    PAST MEDICAL HISTORY: Past Medical History:  Diagnosis Date   BPH (benign  prostatic hyperplasia)    Cancer (HCC)    colon   Cataract    Colonic mass 07/03/2011   Difficult intubation    extra long tube for intub per wife; states "his neck is longer than regular people" and slow to wake up    Erosive gastritis 07/03/2011   GERD (gastroesophageal reflux disease)    History of kidney stones    Kidney stones 2011   Renal calculus    Tooth infection 07/03/2011    PAST SURGICAL HISTORY: Past Surgical History:  Procedure Laterality Date   CATARACT EXTRACTION W/PHACO Left 08/23/2012   Procedure: CATARACT EXTRACTION PHACO AND INTRAOCULAR LENS PLACEMENT (IOC);  Surgeon: Gemma Payor, MD;  Location: [facility];  Service: Ophthalmology;  Laterality: Left;  CDE 16.64   CATARACT EXTRACTION W/PHACO Right 06/09/2013   Procedure: CATARACT EXTRACTION PHACO AND INTRAOCULAR LENS PLACEMENT (IOC) CDE=10.86;  Surgeon: Gemma Payor, MD;  Location: [facility];  Service: Ophthalmology;  Laterality: Right;   COLON SURGERY     COLONOSCOPY  07/03/2011   Procedure: COLONOSCOPY;  Surgeon: Malissa Hippo, MD;  [facility];  Service: Endoscopy;  Laterality: N/A;   COLONOSCOPY N/A 09/16/2012   Procedure: COLONOSCOPY;  Surgeon: Malissa Hippo, MD;  [facility];  Service: Endoscopy;  Laterality: N/A;  930   COLONOSCOPY N/A 12/05/2015   Procedure: COLONOSCOPY;  Surgeon: Malissa Hippo, MD;  [facility]: [facility];  Service: Endoscopy;  Laterality: N/A;  1200   COLONOSCOPY WITH PROPOFOL N/A 02/06/2021   Procedure: COLONOSCOPY WITH PROPOFOL;  Surgeon: Malissa Hippo, MD;  [facility];  Service: Endoscopy;  Laterality: N/A;  12:50   CYSTOSCOPY  07/09/2011   Procedure: CYSTOSCOPY FLEXIBLE;  Surgeon: Ky Barban, MD;  Location: [facility];  Service: Urology;  Laterality: N/A;   CYSTOSCOPY WITH LITHOLAPAXY  07/11/2011   Procedure: CYSTOSCOPY WITH LITHOLAPAXY;  Surgeon: Ky Barban, MD;  Location: [facility];  Service: Urology;  Laterality: N/A;   ESOPHAGOGASTRODUODENOSCOPY   07/03/2011   Procedure: ESOPHAGOGASTRODUODENOSCOPY (EGD);  Surgeon: Malissa Hippo, MD;  [facility];  Service: Endoscopy;  Laterality: N/A;   HERNIA REPAIR  2008    umbilical    INGUINAL HERNIA REPAIR Right 05/04/2017   Procedure: HERNIA REPAIR INGUINAL ADULT WITH MESH;  Surgeon: Franky Macho, MD;  Location: [facility];  Service: General;  Laterality: Right;   PARTIAL COLECTOMY  07/04/2011   Procedure: PARTIAL COLECTOMY;  Surgeon: Dalia Heading, MD;  Location: [facility];  Service: General;  Laterality: N/A;   POLYPECTOMY  02/06/2021   Procedure: POLYPECTOMY;  Surgeon: Malissa Hippo, MD;  [facility];  Service: Endoscopy;;   Prostage cancer     PROSTATE BIOPSY     ROBOT ASSISTED LAPAROSCOPIC RADICAL PROSTATECTOMY Bilateral 01/19/2015   Procedure: ROBOTIC ASSISTED LAPAROSCOPIC RADICAL PROSTATECTOMY AND BILATERAL PELVIC LYMPH NODE DISSECTION, LAPARASCOPIC  LYSIS OF ADHESIONS;  Surgeon: Crist Fat, MD;  Location: [facility];  Service: Urology;  Laterality: Bilateral;   TRANSURETHRAL RESECTION OF PROSTATE  07/11/2011   Procedure: TRANSURETHRAL RESECTION OF THE PROSTATE (TURP);  Surgeon: Ky Barban, MD;  Location: [facility];  Service: Urology;  Laterality: N/A;    FAMILY HISTORY: Family History  Problem Relation Age of Onset   Alzheimer's disease Mother    Alzheimer's disease Father    Aneurysm Brother        brain   Anuerysm Brother    Healthy Son    Healthy Son    Colon cancer Neg Hx     SOCIAL HISTORY: Social History   Socioeconomic History   Marital status: Married    Spouse name: Furnell Hughart   Number of children: 2   Years of education: 12   Highest education level: 12th grade  Occupational History   Occupation: farmer    Comment: tobacco; cattle; Environmental education officer  Tobacco Use   Smoking status: Former    Current packs/day: 0.00    Average packs/day: 2.0 packs/day for 25.0 years (50.0 ttl pk-yrs)    Types: Cigarettes    Start date: 06/02/1964    Quit date:  06/02/1989    Years since quitting: 33.8  Passive exposure: Past   Smokeless tobacco: Never  Vaping Use   Vaping status: Never Used  Substance and Sexual Activity   Alcohol use: No   Drug use: No   Sexual activity: Not Currently    Partners: Female  Other Topics Concern   Not on file  Social History Narrative   Right handed    Caffeine- none    Lives at home with wife Cordelia Pen    Social Determinants of Health   Financial Resource Strain: Low Risk  (01/01/2022)   Overall Financial Resource Strain (CARDIA)    Difficulty of Paying Living Expenses: Not hard at all  Food Insecurity: No Food Insecurity (01/01/2022)   Hunger Vital Sign    Worried About Running Out of Food in the Last Year: Never true    Ran Out of Food in the Last Year: Never true  Transportation Needs: No Transportation Needs (01/01/2022)   PRAPARE - Administrator, Civil Service (Medical): No    Lack of Transportation (Non-Medical): No  Physical Activity: Inactive (01/01/2022)   Exercise Vital Sign    Days of Exercise per Week: 0 days    Minutes of Exercise per Session: 0 min  Stress: No Stress Concern Present (01/01/2022)   [employer] of Occupational Health - Occupational Stress Questionnaire    Feeling of Stress : Only a little  Social Connections: Socially Integrated (01/01/2022)   Social Connection and Isolation Panel [NHANES]    Frequency of Communication with Friends and Family: More than three times a week    Frequency of Social Gatherings with Friends and Family: More than three times a week    Attends Religious Services: More than 4 times per year    Active Member of [employer] or Organizations: Yes    Attends Engineer, structural: More than 4 times per year    Marital Status: Married  Catering manager Violence: Not At Risk (01/01/2022)   Humiliation, Afraid, Rape, and Kick questionnaire    Fear of Current or Ex-Partner: No    Emotionally Abused: No    Physically Abused: No    Sexually  Abused: No    PHYSICAL EXAM  GENERAL EXAM/CONSTITUTIONAL: Vitals:  Vitals:   03/19/23 1448  BP: 139/70  Pulse: 76  Weight: 246 lb (111.6 kg)  Height: 6\' 2"  (1.88 m)     Body mass index is 31.58 kg/m. Wt Readings from Last 3 Encounters:  03/19/23 246 lb (111.6 kg)  09/23/22 236 lb (107 kg)  06/25/22 236 lb (107 kg)   Patient is in no distress; well developed, nourished and groomed; neck is supple  MUSCULOSKELETAL: Gait, strength, tone, movements noted in Neurologic exam below  NEUROLOGIC: MENTAL STATUS:      No data to display         awake, alert, oriented to person, place and time recent and remote memory intact normal attention and concentration language fluent, comprehension intact, naming intact fund of knowledge appropriate  CRANIAL NERVE:  2nd, 3rd, 4th, 6th - visual fields full to confrontation, extraocular muscles intact, no nystagmus 5th - facial sensation symmetric 7th - facial strength symmetric 8th - hearing intact 9th - palate elevates symmetrically, uvula midline 11th - shoulder shrug symmetric 12th - tongue protrusion midline  MOTOR:  normal bulk and tone, full strength in the BUE, BLE  SENSORY:  Decrease sensation to pinprick and vibration to bilateral feet up to ankle. Normal proprioception.   COORDINATION:  finger-nose-finger, fine finger movements normal  GAIT/STATION:  normal   DIAGNOSTIC DATA (LABS, IMAGING, TESTING) - I reviewed patient records, labs, notes, testing and imaging myself where available.  Lab Results  Component Value Date   WBC 6.5 03/20/2022   HGB 13.3 03/20/2022   HCT 39.5 03/20/2022   MCV 90 03/20/2022   PLT 287 03/20/2022      Component Value Date/Time   NA 144 03/20/2022 1339   K 4.7 03/20/2022 1339   CL 104 03/20/2022 1339   CO2 29 03/20/2022 1339   GLUCOSE 102 (H) 03/20/2022 1339   GLUCOSE 99 02/12/2021 1115   BUN 11 03/20/2022 1339   CREATININE 0.94 03/20/2022 1339   CALCIUM 9.2 03/20/2022  1339   PROT 6.4 03/20/2022 1339   ALBUMIN 4.2 03/20/2022 1339   AST 22 03/20/2022 1339   ALT 11 03/20/2022 1339   ALKPHOS 79 03/20/2022 1339   BILITOT 0.2 03/20/2022 1339   GFRNONAA >60 02/12/2021 1115   GFRAA >60 02/07/2020 1123   Lab Results  Component Value Date   CHOL 167 01/26/2015   HDL 26 (L) 01/26/2015   LDLCALC 111 (H) 01/26/2015   TRIG 151 (H) 01/26/2015   CHOLHDL 6.4 01/26/2015   Lab Results  Component Value Date   HGBA1C 5.4 03/20/2022   Lab Results  Component Value Date   VITAMINB12 >2000 (H) 03/20/2022   Lab Results  Component Value Date   TSH 2.970 03/20/2022    Head CT 06/25/2020 1. No acute intracranial abnormalities. 2. Chronic atrophy and small vessel ischemic changes.   ASSESSMENT AND PLAN  77 y.o. year old male with history of colon cancer, BPH, seizure disorder, MCI who is presenting for follow up for his peripheral neuropathy. He was already started on Gabapentin and currently taking Gabapentin 200 mg nightly, will increase to 300 mg three times daily.  In terms of His MCI, will continue both Namenda and Aricept and continue Depakote for the seizures. Follow up in a year    1. Mild cognitive impairment   2. Neuropathy   3. Seizure disorder [facility])     Patient Instructions  Continue current medications Increase gabapentin to 300 mg 3 times daily Start Emla cream as needed Follow-up in 6 months or sooner if worse.  No orders of the defined types were placed in this encounter.   Meds ordered this encounter  Medications   gabapentin (NEURONTIN) 300 MG capsule    Sig: Take 1 capsule (300 mg total) by mouth 3 (three) times daily.    Dispense:  540 capsule    Refill:  2   lidocaine-prilocaine (EMLA) cream    Sig: Apply 1 Application topically as needed.    Dispense:  30 g    Refill:  2    Return in about 6 months (around 09/17/2023).    Windell Norfolk, MD 03/23/2023, 6:32 PM  [facility] [address], [address] [place], [place] [address] [phone]

## 2023-03-23 NOTE — Patient Instructions (Addendum)
Continue current medications Increase gabapentin to 300 mg 3 times daily Start Emla cream as needed Follow-up in 6 months or sooner if worse.

## 2023-03-27 ENCOUNTER — Other Ambulatory Visit: Payer: Self-pay | Admitting: Neurology

## 2023-05-12 DIAGNOSIS — R059 Cough, unspecified: Secondary | ICD-10-CM | POA: Diagnosis not present

## 2023-05-12 DIAGNOSIS — Z87891 Personal history of nicotine dependence: Secondary | ICD-10-CM | POA: Diagnosis not present

## 2023-05-12 DIAGNOSIS — J019 Acute sinusitis, unspecified: Secondary | ICD-10-CM | POA: Diagnosis not present

## 2023-07-22 DIAGNOSIS — R0600 Dyspnea, unspecified: Secondary | ICD-10-CM | POA: Diagnosis not present

## 2023-07-22 DIAGNOSIS — J019 Acute sinusitis, unspecified: Secondary | ICD-10-CM | POA: Diagnosis not present

## 2023-07-30 DIAGNOSIS — R7303 Prediabetes: Secondary | ICD-10-CM | POA: Diagnosis not present

## 2023-07-30 DIAGNOSIS — G40909 Epilepsy, unspecified, not intractable, without status epilepticus: Secondary | ICD-10-CM | POA: Diagnosis not present

## 2023-07-30 DIAGNOSIS — E782 Mixed hyperlipidemia: Secondary | ICD-10-CM | POA: Diagnosis not present

## 2023-07-30 DIAGNOSIS — E538 Deficiency of other specified B group vitamins: Secondary | ICD-10-CM | POA: Diagnosis not present

## 2023-08-05 DIAGNOSIS — R4189 Other symptoms and signs involving cognitive functions and awareness: Secondary | ICD-10-CM | POA: Diagnosis not present

## 2023-08-05 DIAGNOSIS — G40909 Epilepsy, unspecified, not intractable, without status epilepticus: Secondary | ICD-10-CM | POA: Diagnosis not present

## 2023-08-05 DIAGNOSIS — Z85038 Personal history of other malignant neoplasm of large intestine: Secondary | ICD-10-CM | POA: Diagnosis not present

## 2023-08-05 DIAGNOSIS — H8113 Benign paroxysmal vertigo, bilateral: Secondary | ICD-10-CM | POA: Diagnosis not present

## 2023-08-05 DIAGNOSIS — R7303 Prediabetes: Secondary | ICD-10-CM | POA: Diagnosis not present

## 2023-08-05 DIAGNOSIS — K219 Gastro-esophageal reflux disease without esophagitis: Secondary | ICD-10-CM | POA: Diagnosis not present

## 2023-08-05 DIAGNOSIS — E538 Deficiency of other specified B group vitamins: Secondary | ICD-10-CM | POA: Diagnosis not present

## 2023-08-05 DIAGNOSIS — E782 Mixed hyperlipidemia: Secondary | ICD-10-CM | POA: Diagnosis not present

## 2023-08-05 DIAGNOSIS — H9193 Unspecified hearing loss, bilateral: Secondary | ICD-10-CM | POA: Diagnosis not present

## 2023-08-05 DIAGNOSIS — Z8546 Personal history of malignant neoplasm of prostate: Secondary | ICD-10-CM | POA: Diagnosis not present

## 2023-08-05 DIAGNOSIS — R194 Change in bowel habit: Secondary | ICD-10-CM | POA: Diagnosis not present

## 2023-08-05 DIAGNOSIS — N1831 Chronic kidney disease, stage 3a: Secondary | ICD-10-CM | POA: Diagnosis not present

## 2023-08-24 ENCOUNTER — Telehealth: Payer: Self-pay | Admitting: Neurology

## 2023-08-24 NOTE — Telephone Encounter (Signed)
 Called to reschedule 10/13/23 appt due to MD being out. Patient's wife stated that patient was seen by Dr. Margo Aye and he had concern over one of his blood levels and she said that he was going to send it over for Dr. Teresa Coombs to review and she also had some questions regarding divalproex (DEPAKOTE) 250 MG.

## 2023-08-24 NOTE — Telephone Encounter (Signed)
 His depakote level is low but it is normal for patient because he is taking a low dose. No need to increase the dose.

## 2023-08-24 NOTE — Telephone Encounter (Signed)
 Called and spoke to wife of pt and stated:  William Norfolk, MD    [facility] His depakote level is low but it is normal for patient because he is taking a low dose. No need to increase the dose.    Pt voiced understanding no change in dose

## 2023-08-24 NOTE — Telephone Encounter (Signed)
 Placed labs in providers office for review

## 2023-08-25 ENCOUNTER — Other Ambulatory Visit: Payer: Self-pay | Admitting: Neurology

## 2023-08-26 DIAGNOSIS — D225 Melanocytic nevi of trunk: Secondary | ICD-10-CM | POA: Diagnosis not present

## 2023-08-26 DIAGNOSIS — L570 Actinic keratosis: Secondary | ICD-10-CM | POA: Diagnosis not present

## 2023-08-26 DIAGNOSIS — L821 Other seborrheic keratosis: Secondary | ICD-10-CM | POA: Diagnosis not present

## 2023-08-26 DIAGNOSIS — Z85828 Personal history of other malignant neoplasm of skin: Secondary | ICD-10-CM | POA: Diagnosis not present

## 2023-08-26 DIAGNOSIS — Z08 Encounter for follow-up examination after completed treatment for malignant neoplasm: Secondary | ICD-10-CM | POA: Diagnosis not present

## 2023-08-26 DIAGNOSIS — X32XXXD Exposure to sunlight, subsequent encounter: Secondary | ICD-10-CM | POA: Diagnosis not present

## 2023-08-26 DIAGNOSIS — L814 Other melanin hyperpigmentation: Secondary | ICD-10-CM | POA: Diagnosis not present

## 2023-10-07 ENCOUNTER — Ambulatory Visit (INDEPENDENT_AMBULATORY_CARE_PROVIDER_SITE_OTHER): Admitting: Podiatry

## 2023-10-07 ENCOUNTER — Encounter: Payer: Self-pay | Admitting: Podiatry

## 2023-10-07 VITALS — Ht 74.0 in | Wt 246.0 lb

## 2023-10-07 DIAGNOSIS — L600 Ingrowing nail: Secondary | ICD-10-CM

## 2023-10-07 MED ORDER — AMOXICILLIN-POT CLAVULANATE 875-125 MG PO TABS: 1.0000 | ORAL_TABLET | Freq: Two times a day (BID) | ORAL | 0 refills | Status: DC

## 2023-10-07 NOTE — Progress Notes (Signed)
 No chief complaint on file.   Subjective: Patient presents today for evaluation of pain to the medial and lateral border of the left great toe. Patient is concerned for possible ingrown nail.  It is very sensitive to touch.  Patient presents today for further treatment and evaluation.  Past Medical History:  Diagnosis Date   BPH (benign prostatic hyperplasia)    Cancer (HCC)    colon   Cataract    Colonic mass 07/03/2011   Difficult intubation    extra long tube for intub per wife; states "his neck is longer than regular people" and slow to wake up    Erosive gastritis 07/03/2011   GERD (gastroesophageal reflux disease)    History of kidney stones    Kidney stones 2011   Renal calculus    Tooth infection 07/03/2011    Past Surgical History:  Procedure Laterality Date   CATARACT EXTRACTION W/PHACO Left 08/23/2012   Procedure: CATARACT EXTRACTION PHACO AND INTRAOCULAR LENS PLACEMENT (IOC);  Surgeon: Anner Kill, MD;  Location: [facility];  Service: Ophthalmology;  Laterality: Left;  CDE 16.64   CATARACT EXTRACTION W/PHACO Right 06/09/2013   Procedure: CATARACT EXTRACTION PHACO AND INTRAOCULAR LENS PLACEMENT (IOC) CDE=10.86;  Surgeon: Anner Kill, MD;  Location: [facility];  Service: Ophthalmology;  Laterality: Right;   COLON SURGERY     COLONOSCOPY  07/03/2011   Procedure: COLONOSCOPY;  Surgeon: Ruby Corporal, MD;  [facility];  Service: Endoscopy;  Laterality: N/A;   COLONOSCOPY N/A 09/16/2012   Procedure: COLONOSCOPY;  Surgeon: Ruby Corporal, MD;  [facility];  Service: Endoscopy;  Laterality: N/A;  930   COLONOSCOPY N/A 12/05/2015   Procedure: COLONOSCOPY;  Surgeon: Ruby Corporal, MD;  [facility];  Service: Endoscopy;  Laterality: N/A;  1200   COLONOSCOPY WITH PROPOFOL  N/A 02/06/2021   Procedure: COLONOSCOPY WITH PROPOFOL ;  Surgeon: Ruby Corporal, MD;  [facility];  Service: Endoscopy;  Laterality: N/A;  12:50   CYSTOSCOPY  07/09/2011    Procedure: CYSTOSCOPY FLEXIBLE;  Surgeon: Reggie Caper, MD;  Location: [facility];  Service: Urology;  Laterality: N/A;   CYSTOSCOPY WITH LITHOLAPAXY  07/11/2011   Procedure: CYSTOSCOPY WITH LITHOLAPAXY;  Surgeon: Reggie Caper, MD;  Location: [facility];  Service: Urology;  Laterality: N/A;   ESOPHAGOGASTRODUODENOSCOPY  07/03/2011   Procedure: ESOPHAGOGASTRODUODENOSCOPY (EGD);  Surgeon: Ruby Corporal, MD;  [facility];  Service: Endoscopy;  Laterality: N/A;   HERNIA REPAIR  2008    umbilical    INGUINAL HERNIA REPAIR Right 05/04/2017   Procedure: HERNIA REPAIR INGUINAL ADULT WITH MESH;  Surgeon: Alanda Allegra, MD;  Location: [facility];  Service: General;  Laterality: Right;   PARTIAL COLECTOMY  07/04/2011   Procedure: PARTIAL COLECTOMY;  Surgeon: Beau Bound, MD;  Location: [facility];  Service: General;  Laterality: N/A;   POLYPECTOMY  02/06/2021   Procedure: POLYPECTOMY;  Surgeon: Ruby Corporal, MD;  [facility];  Service: Endoscopy;;   Prostage cancer     PROSTATE BIOPSY     ROBOT ASSISTED LAPAROSCOPIC RADICAL PROSTATECTOMY Bilateral 01/19/2015   Procedure: ROBOTIC ASSISTED LAPAROSCOPIC RADICAL PROSTATECTOMY AND BILATERAL PELVIC LYMPH NODE DISSECTION, LAPARASCOPIC  LYSIS OF ADHESIONS;  Surgeon: Andrez Banker, MD;  Location: [facility];  Service: Urology;  Laterality: Bilateral;   TRANSURETHRAL RESECTION OF PROSTATE  07/11/2011   Procedure: TRANSURETHRAL RESECTION OF THE PROSTATE (TURP);  Surgeon: Mohammad I Javaid, MD;  Location: [facility];  Service: Urology;  Laterality: N/A;    No Known Allergies  Objective:  General: Well developed, nourished, in no acute distress, alert and oriented x3   Dermatology: Skin is warm, dry and supple bilateral.  Medial and lateral borders left great toe is tender with evidence of an ingrowing nail. Pain on palpation noted to the border of the nail fold. The remaining nails appear unremarkable at this time.   Vascular: DP and PT pulses  palpable.  No clinical evidence of vascular compromise  Neruologic: Grossly intact via light touch bilateral.  Musculoskeletal: No pedal deformity noted  Assesement: #1 Paronychia with ingrowing nail medial and lateral border left great toe  Plan of Care:  -Patient evaluated.  -Discussed treatment alternatives and plan of care. Explained nail avulsion procedure and post procedure course to patient. -Patient opted for permanent partial nail avulsion of the ingrown portion of the nail.  -Prior to procedure, local anesthesia infiltration utilized using 3 ml of a 50:50 mixture of 2% plain lidocaine  and 0.5% plain marcaine  in a normal hallux block fashion and a betadine  prep performed.  -Partial permanent nail avulsion with chemical matrixectomy performed using 3x30sec applications of phenol followed by alcohol flush.  -Light dressing applied.  Post care instructions provided -Prescription for Augmentin  875/25 mg BID x 7 days -Return to clinic 3 weeks  Dot Gazella, DPM [facility]  Dr. Dot Gazella, DPM    [address] [place], [place] [address]                Office [phone]  Fax [phone]

## 2023-10-13 ENCOUNTER — Ambulatory Visit: Payer: Medicare Other | Admitting: Neurology

## 2023-10-28 ENCOUNTER — Ambulatory Visit (INDEPENDENT_AMBULATORY_CARE_PROVIDER_SITE_OTHER): Admitting: Podiatry

## 2023-10-28 DIAGNOSIS — L600 Ingrowing nail: Secondary | ICD-10-CM | POA: Diagnosis not present

## 2023-10-28 NOTE — Progress Notes (Signed)
   Chief Complaint  Patient presents with   Ingrown Toenail    RM 10 Patient is here for ingrown toe nail of the left hallux. Pt states using neosporin to treat pain and swelling of left hallux.    Subjective: 78 y.o. male presents today status post permanent nail avulsion procedure of the medial and lateral border left great toe that was performed on 10/07/2023.  Patient also has symptomatic calluses to the distal tips of the bilateral lesser digits secondary to hammertoe deformity.  He says they are very painful and tender.   Past Medical History:  Diagnosis Date   BPH (benign prostatic hyperplasia)    Cancer (HCC)    colon   Cataract    Colonic mass 07/03/2011   Difficult intubation    extra long tube for intub per wife; states "his neck is longer than regular people" and slow to wake up    Erosive gastritis 07/03/2011   GERD (gastroesophageal reflux disease)    History of kidney stones    Kidney stones 2011   Renal calculus    Tooth infection 07/03/2011    Objective: Neurovascular status intact.  Skin is warm, dry and supple. Nail and respective nail fold appears to be healing appropriately.  Hyperkeratotic callus tissue noted to the distal tips of the 2nd and 3rd digits bilateral. Contracture also noted to the lesser digits  Assessment: #1 s/p partial permanent nail matrixectomy medial and lateral border left great toe.  10/07/2023 #2 symptomatic calluses lesser digits bilateral secondary to hammertoe deformity   Plan of care: #1 patient was evaluated  #2 light debridement of the periungual debris was performed to the border of the respective toe and nail plate using a tissue nipper. #3  Excisional debridement of the callus tissue was performed today using a tissue nipper as a courtesy to the patient  #4 patient is to return to clinic on a PRN basis.  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 Dot Gazella, DPM    [address] [place], [place] [address]                Office [phone]  Fax [phone]

## 2023-11-25 ENCOUNTER — Emergency Department (HOSPITAL_COMMUNITY)
Admission: EM | Admit: 2023-11-25 | Discharge: 2023-11-25 | Disposition: A | Discharge status: Home / Self Care | Attending: Emergency Medicine | Admitting: Emergency Medicine

## 2023-11-25 ENCOUNTER — Other Ambulatory Visit: Payer: Self-pay

## 2023-11-25 ENCOUNTER — Encounter (HOSPITAL_COMMUNITY): Payer: Self-pay

## 2023-11-25 DIAGNOSIS — E860 Dehydration: Secondary | ICD-10-CM | POA: Diagnosis not present

## 2023-11-25 DIAGNOSIS — Z87891 Personal history of nicotine dependence: Secondary | ICD-10-CM | POA: Insufficient documentation

## 2023-11-25 DIAGNOSIS — Z7982 Long term (current) use of aspirin: Secondary | ICD-10-CM | POA: Insufficient documentation

## 2023-11-25 DIAGNOSIS — T675XXA Heat exhaustion, unspecified, initial encounter: Secondary | ICD-10-CM | POA: Diagnosis not present

## 2023-11-25 DIAGNOSIS — I959 Hypotension, unspecified: Secondary | ICD-10-CM | POA: Diagnosis not present

## 2023-11-25 DIAGNOSIS — Z85038 Personal history of other malignant neoplasm of large intestine: Secondary | ICD-10-CM | POA: Diagnosis not present

## 2023-11-25 LAB — COMPREHENSIVE METABOLIC PANEL WITH GFR
ALT: 16 U/L (ref 0–44)
AST: 31 U/L (ref 15–41)
Albumin: 3.3 g/dL — ABNORMAL LOW (ref 3.5–5.0)
Alkaline Phosphatase: 68 U/L (ref 38–126)
Anion gap: 11 (ref 5–15)
BUN: 15 mg/dL (ref 8–23)
CO2: 26 mmol/L (ref 22–32)
Calcium: 8.5 mg/dL — ABNORMAL LOW (ref 8.9–10.3)
Chloride: 102 mmol/L (ref 98–111)
Creatinine, Ser: 0.88 mg/dL (ref 0.61–1.24)
GFR, Estimated: >60 mL/min (ref >60)
Glucose, Bld: 107 mg/dL — ABNORMAL HIGH (ref 70–99)
Potassium: 3.5 mmol/L (ref 3.5–5.1)
Sodium: 139 mmol/L (ref 135–145)
Total Bilirubin: 0.8 mg/dL (ref 0.0–1.2)
Total Protein: 6.7 g/dL (ref 6.5–8.1)

## 2023-11-25 LAB — CBC WITH DIFFERENTIAL/PLATELET
Abs Immature Granulocytes: 0.03 10*3/uL (ref 0.00–0.07)
Basophils Absolute: 0.1 10*3/uL (ref 0.0–0.1)
Basophils Relative: 1 %
Eosinophils Absolute: 0.4 10*3/uL (ref 0.0–0.5)
Eosinophils Relative: 5 %
HCT: 39.4 % (ref 39.0–52.0)
Hemoglobin: 12.8 g/dL — ABNORMAL LOW (ref 13.0–17.0)
Immature Granulocytes: 0 %
Lymphocytes Relative: 15 %
Lymphs Abs: 1.4 10*3/uL (ref 0.7–4.0)
MCH: 29.8 pg (ref 26.0–34.0)
MCHC: 32.5 g/dL (ref 30.0–36.0)
MCV: 91.8 fL (ref 80.0–100.0)
Monocytes Absolute: 0.6 10*3/uL (ref 0.1–1.0)
Monocytes Relative: 7 %
Neutro Abs: 6.7 10*3/uL (ref 1.7–7.7)
Neutrophils Relative %: 72 %
Platelets: 222 10*3/uL (ref 150–400)
RBC: 4.29 MIL/uL (ref 4.22–5.81)
RDW: 13.1 % (ref 11.5–15.5)
WBC: 9.2 10*3/uL (ref 4.0–10.5)
nRBC: 0 % (ref 0.0–0.2)

## 2023-11-25 LAB — URINALYSIS, ROUTINE W REFLEX MICROSCOPIC
Bacteria, UA: NONE SEEN
Bilirubin Urine: NEGATIVE
Glucose, UA: NEGATIVE mg/dL
Hgb urine dipstick: NEGATIVE
Ketones, ur: NEGATIVE mg/dL
Leukocytes,Ua: NEGATIVE
Nitrite: NEGATIVE
Protein, ur: 30 mg/dL — AB
Specific Gravity, Urine: 1.021 (ref 1.005–1.030)
pH: 5 (ref 5.0–8.0)

## 2023-11-25 LAB — LIPASE, BLOOD: Lipase: 31 U/L (ref 11–51)

## 2023-11-25 MED ORDER — SODIUM CHLORIDE 0.9 % IV BOLUS
1500.0000 mL | Freq: Once | INTRAVENOUS | Status: AC
  Administered 2023-11-25: 1500 mL via INTRAVENOUS

## 2023-11-25 NOTE — ED Provider Notes (Signed)
 [facility] AT [facility] Provider Note  CSN: [id number] Arrival date & time: 11/25/23 1742  Chief Complaint(s) Dehydration  HPI William Burns is a 78 y.o. male history of colon cancer, presenting to the emergency department with weakness.  Patient is a Visual merchandiser, has been working outside in the heat, has been over 90 this week.  He has been in a tractor with no air conditioning.  Yesterday had episode where he felt overheated, tried to go to the woods to pee, and then fell over.  Did not hit his head, denies any loss of consciousness but scraped his knees.  Afterwards felt so fatigued that he had to lay in a field, was found by family who brought him back inside.  Today he has just continued to feel quite weak.  No fevers or chills.  No urinary symptoms.  No cough.  No runny nose, sore throat.  No chest pain, shortness of breath.  No abdominal pain  Past Medical History Past Medical History:  Diagnosis Date   BPH (benign prostatic hyperplasia)    Cancer (HCC)    colon   Cataract    Colonic mass 07/03/2011   Difficult intubation    extra long tube for intub per wife; states his neck is longer than regular people and slow to wake up    Erosive gastritis 07/03/2011   GERD (gastroesophageal reflux disease)    History of kidney stones    Kidney stones 2011   Renal calculus    Tooth infection 07/03/2011   Patient Active Problem List   Diagnosis Date Noted   Left thyroid  nodule 01/13/2019   Non-recurrent unilateral inguinal hernia without obstruction or gangrene    GERD (gastroesophageal reflux disease) 01/26/2015   TIA (transient ischemic attack)    Transient global amnesia 01/25/2015   Prostate cancer (HCC) 08/29/2014   Kidney stone on left side 08/02/2013   Diarrhea 07/05/2013   Short-segment Barrett's esophagus 02/09/2012   Calculus, bladder 07/04/2011   Tooth infection 07/03/2011   Adenocarcinoma of colon (HCC) 07/03/2011   Erosive gastritis  07/03/2011   Microcytic hypochromic anemia 07/02/2011   Hx of colonic polyp 07/02/2011   Hyperglycemia 07/02/2011   BPH (benign prostatic hyperplasia) 07/02/2011   Home Medication(s) Prior to Admission medications   Medication Sig Start Date End Date Taking? Authorizing Provider  amoxicillin -clavulanate (AUGMENTIN ) 875-125 MG tablet Take 1 tablet by mouth 2 (two) times daily. 10/07/23   Janit Thresa HERO, DPM  aspirin  EC 81 MG tablet Take 81 mg by mouth daily. Swallow whole.    [provider]  Cholecalciferol (VITAMIN D3) 125 MCG (5000 UT) TABS Take by mouth in the morning.    [provider]  divalproex (DEPAKOTE) 250 MG DR tablet Take 250 mg by mouth in the morning and at bedtime. 01/11/21   [provider]  donepezil  (ARICEPT ) 5 MG tablet TAKE (1) TABLET BY MOUTH AT BEDTIME. 03/30/23   Gregg Lek, MD  doxycycline  (VIBRA -TABS) 100 MG tablet Take 1 tablet (100 mg total) by mouth 2 (two) times daily. 01/21/23   Tobie Franky SQUIBB, DPM  gabapentin  (NEURONTIN ) 300 MG capsule Take 1 capsule (300 mg total) by mouth 3 (three) times daily. 03/19/23 09/09/24  Gregg Lek, MD  Lactobacillus (ACIDOPHILUS PO) Take 1 capsule by mouth in the morning. Now Pb8 Acidophilus    [provider]  lidocaine -prilocaine  (EMLA ) cream Apply 1 Application topically as needed. 03/19/23   Camara, Amadou, MD  loratadine (CLARITIN) 10 MG  tablet Take 10 mg by mouth in the morning.    [provider]  memantine  (NAMENDA ) 10 MG tablet TAKE ONE TABLET BY MOUTH TWICE DAILY 08/25/23   Camara, Amadou, MD  Multiple Vitamin (MULTIVITAMIN WITH MINERALS) TABS tablet Take 1 tablet by mouth in the morning. Centrum Silver for Men 50+    [provider]  Omega-3 Fatty Acids (FISH OIL) 1200 MG CAPS Take 1,200 mg by mouth in the morning.    [provider]  pantoprazole  (PROTONIX ) 40 MG tablet TAKE 1 TABLET BY MOUTH ONCE DAILY. 11/15/18   Rosendo Veva CROME, NP                                                                                                                                     Past Surgical History Past Surgical History:  Procedure Laterality Date   CATARACT EXTRACTION W/PHACO Left 08/23/2012   Procedure: CATARACT EXTRACTION PHACO AND INTRAOCULAR LENS PLACEMENT (IOC);  Surgeon: Cherene Mania, MD;  Location: [facility];  Service: Ophthalmology;  Laterality: Left;  CDE 16.64   CATARACT EXTRACTION W/PHACO Right 06/09/2013   Procedure: CATARACT EXTRACTION PHACO AND INTRAOCULAR LENS PLACEMENT (IOC) CDE=10.86;  Surgeon: Cherene Mania, MD;  Location: [facility];  Service: Ophthalmology;  Laterality: Right;   COLON SURGERY     COLONOSCOPY  07/03/2011   Procedure: COLONOSCOPY;  Surgeon: Claudis RAYMOND Rivet, MD;  [facility];  Service: Endoscopy;  Laterality: N/A;   COLONOSCOPY N/A 09/16/2012   Procedure: COLONOSCOPY;  Surgeon: Claudis RAYMOND Rivet, MD;  [facility];  Service: Endoscopy;  Laterality: N/A;  930   COLONOSCOPY N/A 12/05/2015   Procedure: COLONOSCOPY;  Surgeon: Claudis RAYMOND Rivet, MD;  [facility];  Service: Endoscopy;  Laterality: N/A;  1200   COLONOSCOPY WITH PROPOFOL  N/A 02/06/2021   Procedure: COLONOSCOPY WITH PROPOFOL ;  Surgeon: Rivet Claudis RAYMOND, MD;  [facility];  Service: Endoscopy;  Laterality: N/A;  12:50   CYSTOSCOPY  07/09/2011   Procedure: CYSTOSCOPY FLEXIBLE;  Surgeon: Emery LILLETTE Blaze, MD;  Location: [facility];  Service: Urology;  Laterality: N/A;   CYSTOSCOPY WITH LITHOLAPAXY  07/11/2011   Procedure: CYSTOSCOPY WITH LITHOLAPAXY;  Surgeon: Emery LILLETTE Blaze, MD;  Location: [facility];  Service: Urology;  Laterality: N/A;   ESOPHAGOGASTRODUODENOSCOPY  07/03/2011   Procedure: ESOPHAGOGASTRODUODENOSCOPY (EGD);  Surgeon: Claudis RAYMOND Rivet, MD;  [facility];  Service: Endoscopy;  Laterality: N/A;   HERNIA REPAIR  2008    umbilical    INGUINAL HERNIA REPAIR Right 05/04/2017   Procedure: HERNIA REPAIR INGUINAL ADULT WITH MESH;  Surgeon:  Mavis Anes, MD;  Location: [facility];  Service: General;  Laterality: Right;   PARTIAL COLECTOMY  07/04/2011   Procedure: PARTIAL COLECTOMY;  Surgeon: Anes DELENA Mavis, MD;  Location: [facility];  Service: General;  Laterality: N/A;   POLYPECTOMY  02/06/2021   Procedure: POLYPECTOMY;  Surgeon: Rivet,  Claudis PENNER, MD;  [facility];  Service: Endoscopy;;   Prostage cancer     PROSTATE BIOPSY     ROBOT ASSISTED LAPAROSCOPIC RADICAL PROSTATECTOMY Bilateral 01/19/2015   Procedure: ROBOTIC ASSISTED LAPAROSCOPIC RADICAL PROSTATECTOMY AND BILATERAL PELVIC LYMPH NODE DISSECTION, LAPARASCOPIC  LYSIS OF ADHESIONS;  Surgeon: Morene LELON Salines, MD;  Location: [facility];  Service: Urology;  Laterality: Bilateral;   TRANSURETHRAL RESECTION OF PROSTATE  07/11/2011   Procedure: TRANSURETHRAL RESECTION OF THE PROSTATE (TURP);  Surgeon: Mohammad I Javaid, MD;  Location: [facility];  Service: Urology;  Laterality: N/A;   Family History Family History  Problem Relation Age of Onset   Alzheimer's disease Mother    Alzheimer's disease Father    Aneurysm Brother        brain   Anuerysm Brother    Healthy Son    Healthy Son    Colon cancer Neg Hx     Social History Social History   Tobacco Use   Smoking status: Former    Current packs/day: 0.00    Average packs/day: 2.0 packs/day for 25.0 years (50.0 ttl pk-yrs)    Types: Cigarettes    Start date: 06/02/1964    Quit date: 06/02/1989    Years since quitting: 34.5    Passive exposure: Past   Smokeless tobacco: Never  Vaping Use   Vaping status: Never Used  Substance Use Topics   Alcohol use: No   Drug use: No   Allergies Patient has no known allergies.  Review of Systems Review of Systems  All other systems reviewed and are negative.   Physical Exam Vital Signs  I have reviewed the triage vital signs BP (!) 119/59   Pulse 75   Temp 98.4 F (36.9 C) (Temporal)   Resp 20   Ht 6' 3 (1.905 m)   Wt 111.1 kg   SpO2 94%   BMI 30.62 kg/m  Physical  Exam Vitals and nursing note reviewed.  Constitutional:      General: He is not in acute distress.    Appearance: Normal appearance.  HENT:     Mouth/Throat:     Mouth: Mucous membranes are dry.   Eyes:     Conjunctiva/sclera: Conjunctivae normal.    Cardiovascular:     Rate and Rhythm: Normal rate and regular rhythm.  Pulmonary:     Effort: Pulmonary effort is normal. No respiratory distress.     Breath sounds: Normal breath sounds.  Abdominal:     General: Abdomen is flat.     Palpations: Abdomen is soft.     Tenderness: There is no abdominal tenderness.   Musculoskeletal:     Right lower leg: No edema.     Left lower leg: No edema.   Skin:    General: Skin is warm and dry.     Capillary Refill: Capillary refill takes less than 2 seconds.   Neurological:     Mental Status: He is alert and oriented to person, place, and time. Mental status is at baseline.     Comments: Cranial nerves II through XII intact, strength 5 out of 5 in the bilateral upper and lower extremities, no sensory deficit to light touch, no dysmetria on finger-nose-finger testing  Psychiatric:        Mood and Affect: Mood normal.        Behavior: Behavior normal.     ED Results and Treatments Labs (all labs ordered are listed, but only abnormal results are displayed) Labs Reviewed  CBC WITH DIFFERENTIAL/PLATELET - Abnormal; Notable for the following components:      Result Value   Hemoglobin 12.8 (*)    All other components within normal limits  COMPREHENSIVE METABOLIC PANEL WITH GFR - Abnormal; Notable for the following components:   Glucose, Bld 107 (*)    Calcium  8.5 (*)    Albumin 3.3 (*)    All other components within normal limits  URINALYSIS, ROUTINE W REFLEX MICROSCOPIC - Abnormal; Notable for the following components:   Protein, ur 30 (*)    All other components within normal limits  LIPASE, BLOOD                                                                                                                           Radiology No results found.  Pertinent labs & imaging results that were available during my care of the patient were reviewed by me and considered in my medical decision making (see MDM for details).  Medications Ordered in ED Medications  sodium chloride  0.9 % bolus 1,500 mL (0 mLs Intravenous Stopped 11/25/23 2239)                                                                                                                                     Procedures Procedures  (including critical care time)  Medical Decision Making / ED Course   MDM:  78 year old presenting to the emergency department for weakness.  Patient overall well-appearing, physical examination without focal abnormality other than mild dehydration, mildly dry mucous membranes.  Suspect likely cause dehydration, patient was out in the sun yesterday, felt overheated, outside in 90 degree heat without air conditioning.  Lower concern for other process such as toxic or metabolic abnormality.  Laboratory testing overall reassuring, no AKI, no electrolyte derangement.  Patient denies any focal infectious symptoms, examination reassuring.  Urinalysis is without signs of UTI.  Lungs clear on exam without signs of pneumonia.  Vital signs in the emergency department are overall reassuring.  Will give IV fluids, reassess.  If patient is feeling better likely discharged with instructions to stay out of the heat  Clinical Course as of 11/25/23 2326  Wed Nov 25, 2023  2324 Patient feeling better, requests discharge. Labs reassuring. Will discharge patient to home. All questions answered. Patient comfortable with plan of discharge. Return precautions discussed with patient and specified on the after visit  summary.  [WS]    Clinical Course User Index [WS] Francesca Elsie CROME, MD     Additional history obtained: -Additional history obtained from ems and spouse -External records from outside  source obtained and reviewed including: Chart review including previous notes, labs, imaging, consultation notes including prior notes    Lab Tests: -I ordered, reviewed, and interpreted labs.   The pertinent results include:   Labs Reviewed  CBC WITH DIFFERENTIAL/PLATELET - Abnormal; Notable for the following components:      Result Value   Hemoglobin 12.8 (*)    All other components within normal limits  COMPREHENSIVE METABOLIC PANEL WITH GFR - Abnormal; Notable for the following components:   Glucose, Bld 107 (*)    Calcium  8.5 (*)    Albumin 3.3 (*)    All other components within normal limits  URINALYSIS, ROUTINE W REFLEX MICROSCOPIC - Abnormal; Notable for the following components:   Protein, ur 30 (*)    All other components within normal limits  LIPASE, BLOOD    Notable for no AKI, borderline anemia     Medicines ordered and prescription drug management: Meds ordered this encounter  Medications   sodium chloride  0.9 % bolus 1,500 mL    -I have reviewed the patients home medicines and have made adjustments as needed   Social Determinants of Health:  Diagnosis or treatment significantly limited by social determinants of health: obesity   Reevaluation: After the interventions noted above, I reevaluated the patient and found that their symptoms have improved  Co morbidities that complicate the patient evaluation  Past Medical History:  Diagnosis Date   BPH (benign prostatic hyperplasia)    Cancer (HCC)    colon   Cataract    Colonic mass 07/03/2011   Difficult intubation    extra long tube for intub per wife; states his neck is longer than regular people and slow to wake up    Erosive gastritis 07/03/2011   GERD (gastroesophageal reflux disease)    History of kidney stones    Kidney stones 2011   Renal calculus    Tooth infection 07/03/2011      Dispostion: Disposition decision including need for hospitalization was considered, and patient discharged  from emergency department.    Final Clinical Impression(s) / ED Diagnoses Final diagnoses:  Heat exhaustion, initial encounter     This chart was dictated using voice recognition software.  Despite best efforts to proofread,  errors can occur which can change the documentation meaning.    Francesca Elsie CROME, MD 11/25/23 [phone]

## 2023-11-25 NOTE — ED Triage Notes (Signed)
 Pt arrived via [facility] from home for c/o possibly being dehydrated. Pt presents with IV Access established and [facility] report they administered 500cc Normal Saline PTA.  Pt reports fatigue since yesterday and reports he has not been feelign well today.

## 2023-11-25 NOTE — Discharge Instructions (Signed)
 We evaluated you for your weakness.  Your testing in the emergency department was reassuring.  Your symptoms are most likely due to dehydration and heat exposure.  Please try to stay out of the heat and drink lots of fluids.  If you have any new or worsening symptoms such as fevers or chills, vomiting, chest pain or abdominal pain, or any other concerning symptoms, please return to the emergency department.

## 2023-12-01 ENCOUNTER — Encounter: Payer: Self-pay | Admitting: Neurology

## 2023-12-01 ENCOUNTER — Ambulatory Visit (INDEPENDENT_AMBULATORY_CARE_PROVIDER_SITE_OTHER): Admitting: Neurology

## 2023-12-01 VITALS — BP 126/73 | HR 81 | Resp 16 | Ht 75.0 in

## 2023-12-01 DIAGNOSIS — R296 Repeated falls: Secondary | ICD-10-CM | POA: Diagnosis not present

## 2023-12-01 DIAGNOSIS — G3184 Mild cognitive impairment, so stated: Secondary | ICD-10-CM | POA: Diagnosis not present

## 2023-12-01 DIAGNOSIS — G629 Polyneuropathy, unspecified: Secondary | ICD-10-CM | POA: Diagnosis not present

## 2023-12-01 DIAGNOSIS — G40909 Epilepsy, unspecified, not intractable, without status epilepticus: Secondary | ICD-10-CM | POA: Diagnosis not present

## 2023-12-01 DIAGNOSIS — R269 Unspecified abnormalities of gait and mobility: Secondary | ICD-10-CM | POA: Diagnosis not present

## 2023-12-01 MED ORDER — MEMANTINE HCL 10 MG PO TABS: 10.0000 mg | ORAL_TABLET | Freq: Two times a day (BID) | ORAL | 3 refills | Status: AC

## 2023-12-01 MED ORDER — RIVASTIGMINE TARTRATE 1.5 MG PO CAPS: 1.5000 mg | ORAL_CAPSULE | Freq: Two times a day (BID) | ORAL | 3 refills | Status: DC

## 2023-12-01 MED ORDER — DIVALPROEX SODIUM 250 MG PO DR TAB: 250.0000 mg | DELAYED_RELEASE_TABLET | Freq: Two times a day (BID) | ORAL | 3 refills | Status: AC

## 2023-12-01 MED ORDER — GABAPENTIN 300 MG PO CAPS: 300.0000 mg | ORAL_CAPSULE | Freq: Three times a day (TID) | ORAL | 2 refills | Status: DC

## 2023-12-01 NOTE — Patient Instructions (Signed)
 Increase water  intake, at least 32 ounces of water  daily, consider adding electrolytes pack to your water   Referral to physical therapy for gait training and multiple falls  Start Exelon 1.5 mg twice daily. Could not tolerate Aricept    Return in a year or sooner if worse

## 2023-12-01 NOTE — Progress Notes (Signed)
 [facility]  PATIENT: William Burns DOB: 01/01/1946  REQUESTING CLINICIAN: Shona Norleen PEDLAR, MD HISTORY FROM: Patient and spouse  REASON FOR VISIT: Hx of seizure and worsening memory    HISTORICAL  CHIEF COMPLAINT:  Chief Complaint  Patient presents with   Follow-up    Rm12, wife present, SZ: no recent sz, last 1 3 yrs ago, well controlled. Pt had shuffling gait when walking to room.  MCI: TRIED TO DO MOCA AND COULDN'T    INTERVAL HISTORY 12/01/2023 Patient presents today for follow-up, last visit was in October.  He is accompanied by wife.  Since last visit, he has not had any seizure or seizure like activity.  He is compliant with his Depakote 250 mg twice daily.  In terms of the neuropathy, taking gabapentin  600 mg at night is enough to control his pain.  Tells me currently he is doing well.   For his cognitive impairment, he was on Aricept  but did have vivid dreams therefore medication discontinued.  Wife tells me that his memory is still getting poor, he is forgetful, and lately has been easily irritable.  He does work on his farm daily, but has been noted to be dehydrated causing him multiple falls.  Wife is also concerned about his gait, most the time he uses a device with ambulation but sometimes he will not use a device and will fall, last fall was last week when patient was found by her son next to his tractor.   INTERVAL HISTORY 03/19/2023 Patient presents today for follow-up, he is accompanied by wife.  Last visit was in April since then she has been doing well, she denies any seizure or seizure activity.  He is compliant with the Depakote.  His main complaint today is his neuropathy.  He reports at the end of the day his pain is high.  He takes the gabapentin  300 mg in the afternoon and 300 mg at bedtime.   In terms of his memory, he is stable, his ATN profile was negative for Alzheimer disease biomarkers.   INTERVAL HISTORY 09/23/2022:  Patient presents  today for follow-up, he is accompanied by wife.  Last visit was 6 months ago.  Since then he has been doing well.  He denies any seizure or seizure-like activity, compliant with his depakote. He reports that he was recently diagnosed with a blood clot but it was in the superficial vein, he was on Eliquis , completed treatment and now on aspirin  alone.  He still complaining of memory issues.  He was recently diagnosed with hearing loss and will be evaluated for hearing aids.  He reports that his main issue is remembering names of people that he knew before.  Wife reported he gets active aggravated when he does not remember the names.  Both parents have dementia.  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 When it comes to his peripheral neuropathy, at last visit I have advised him to increase the gabapentin  to 200 mg up to 3 times a day.  They have not proceeded with the increase, he is still on Gabapentin  200 mg nightly.   INTERVAL HISTORY 03/20/2022:  Patient presents today for follow-up, he is accompanied by his wife.  Last visit was in January, since then he has not had episodes of seizure or seizure-like activity.  He is compliant with the Depakote. At his last visit with a podiatrist he complained of numbness in the bilateral feet.  Due to that he was referred  to neurology.  He reports the numbness and pain started about 6 months ago.  He started at the toes and all the way to the ankle.  Described the pain as aching, denies any burning or stabbing pain.  He does have history of bilateral hammertoe with pain.  He denies any recent falls. He was started on Gabapentin  100 mg, uptitrated to 200 mg at night with minimal relief.    HISTORY OF PRESENT ILLNESS:  This is a 78 year old gentleman with past medical history of colon cancer, BPH, seizures and worsening memory who is presenting to establish care.  Patient reported he had his first lifetime seizure on June 25, 2020.  Per wife he was around 12:45 at  night when she noted patient starts shaking on the bed, all body shaking, she could not stop it.  EMS was called and patient was taken to the ED.  He was noted to have postictal confusion.  No reported tongue biting or urinary incontinence.  He was back to his normal self and discharged home with neurology follow-up.  Patient reported he follow-up with a neurologist, had a EEG, was told that the EEG was abnormal (showed seizures) and he was started on antiseizure medication. He was started on Depakote 250 mg twice daily.  Since then he has been compliant with the Depakote, denies any additional seizures but has not followed up with neurology for the past year.  In terms of his memory problem, he describes issues with recognizing people face, trouble with remembering people's names,  wife reports that he will asked the same questions over and over and sometimes repeat himself too.  Wife also has to remind him of events.  Other than that, he is still very active, he working in his farm, he still drives, denies any accident, denies being lost in familiar places.  He is currently on Namenda  5 mg twice daily. Wife handles all finances, she has been doing it for many years.   OTHER MEDICAL CONDITIONS: Colon cancer, BPH, seizure disorder, memory problem,   REVIEW OF SYSTEMS: Full 14 system review of systems performed and negative with exception of: As noted in HPI  ALLERGIES: No Known Allergies  HOME MEDICATIONS: Outpatient Medications Prior to Visit  Medication Sig Dispense Refill   aspirin  EC 81 MG tablet Take 81 mg by mouth daily. Swallow whole.     Cholecalciferol (VITAMIN D3) 125 MCG (5000 UT) TABS Take by mouth in the morning.     Lactobacillus (ACIDOPHILUS PO) Take 1 capsule by mouth in the morning. Now Pb8 Acidophilus     loratadine (CLARITIN) 10 MG tablet Take 10 mg by mouth in the morning.     Multiple Vitamin (MULTIVITAMIN WITH MINERALS) TABS tablet Take 1 tablet by mouth in the morning.  Centrum Silver for Men 50+     Omega-3 Fatty Acids (FISH OIL) 1200 MG CAPS Take 1,200 mg by mouth in the morning.     pantoprazole  (PROTONIX ) 40 MG tablet TAKE 1 TABLET BY MOUTH ONCE DAILY. 90 tablet 3   divalproex (DEPAKOTE) 250 MG DR tablet Take 250 mg by mouth in the morning and at bedtime.     gabapentin  (NEURONTIN ) 300 MG capsule Take 1 capsule (300 mg total) by mouth 3 (three) times daily. 540 capsule 2   memantine  (NAMENDA ) 10 MG tablet TAKE ONE TABLET BY MOUTH TWICE DAILY 180 tablet 1   amoxicillin -clavulanate (AUGMENTIN ) 875-125 MG tablet Take 1 tablet by mouth 2 (two) times daily. 14 tablet 0  donepezil  (ARICEPT ) 5 MG tablet TAKE (1) TABLET BY MOUTH AT BEDTIME. 30 tablet 6   doxycycline  (VIBRA -TABS) 100 MG tablet Take 1 tablet (100 mg total) by mouth 2 (two) times daily. 20 tablet 0   lidocaine -prilocaine  (EMLA ) cream Apply 1 Application topically as needed. 30 g 2   No facility-administered medications prior to visit.    PAST MEDICAL HISTORY: Past Medical History:  Diagnosis Date   BPH (benign prostatic hyperplasia)    Cancer (HCC)    colon   Cataract    Colonic mass 07/03/2011   Difficult intubation    extra long tube for intub per wife; states his neck is longer than regular people and slow to wake up    Erosive gastritis 07/03/2011   GERD (gastroesophageal reflux disease)    History of kidney stones    Kidney stones 2011   Renal calculus    Tooth infection 07/03/2011    PAST SURGICAL HISTORY: Past Surgical History:  Procedure Laterality Date   CATARACT EXTRACTION W/PHACO Left 08/23/2012   Procedure: CATARACT EXTRACTION PHACO AND INTRAOCULAR LENS PLACEMENT (IOC);  Surgeon: Cherene Mania, MD;  Location: [facility];  Service: Ophthalmology;  Laterality: Left;  CDE 16.64   CATARACT EXTRACTION W/PHACO Right 06/09/2013   Procedure: CATARACT EXTRACTION PHACO AND INTRAOCULAR LENS PLACEMENT (IOC) CDE=10.86;  Surgeon: Cherene Mania, MD;  Location: [facility];  Service: Ophthalmology;   Laterality: Right;   COLON SURGERY     COLONOSCOPY  07/03/2011   Procedure: COLONOSCOPY;  Surgeon: Claudis RAYMOND Rivet, MD;  Location: [facility];  Service: Endoscopy;  Laterality: N/A;   COLONOSCOPY N/A 09/16/2012   Procedure: COLONOSCOPY;  Surgeon: Claudis RAYMOND Rivet, MD;  [facility]: [facility];  Service: Endoscopy;  Laterality: N/A;  930   COLONOSCOPY N/A 12/05/2015   Procedure: COLONOSCOPY;  Surgeon: Claudis RAYMOND Rivet, MD;  [facility]: [facility];  Service: Endoscopy;  Laterality: N/A;  1200   COLONOSCOPY WITH PROPOFOL  N/A 02/06/2021   Procedure: COLONOSCOPY WITH PROPOFOL ;  Surgeon: Rivet Claudis RAYMOND, MD;  [facility]: [facility];  Service: Endoscopy;  Laterality: N/A;  12:50   CYSTOSCOPY  07/09/2011   Procedure: CYSTOSCOPY FLEXIBLE;  Surgeon: Emery LILLETTE Blaze, MD;  Location: [facility];  Service: Urology;  Laterality: N/A;   CYSTOSCOPY WITH LITHOLAPAXY  07/11/2011   Procedure: CYSTOSCOPY WITH LITHOLAPAXY;  Surgeon: Emery LILLETTE Blaze, MD;  Location: [facility];  Service: Urology;  Laterality: N/A;   ESOPHAGOGASTRODUODENOSCOPY  07/03/2011   Procedure: ESOPHAGOGASTRODUODENOSCOPY (EGD);  Surgeon: Claudis RAYMOND Rivet, MD;  [facility]: [facility];  Service: Endoscopy;  Laterality: N/A;   HERNIA REPAIR  2008    umbilical    INGUINAL HERNIA REPAIR Right 05/04/2017   Procedure: HERNIA REPAIR INGUINAL ADULT WITH MESH;  Surgeon: Mavis Anes, MD;  Location: [facility];  Service: General;  Laterality: Right;   PARTIAL COLECTOMY  07/04/2011   Procedure: PARTIAL COLECTOMY;  Surgeon: Anes DELENA Mavis, MD;  Location: [facility];  Service: General;  Laterality: N/A;   POLYPECTOMY  02/06/2021   Procedure: POLYPECTOMY;  Surgeon: Rivet Claudis RAYMOND, MD;  [facility]: [facility];  Service: Endoscopy;;   Prostage cancer     PROSTATE BIOPSY     ROBOT ASSISTED LAPAROSCOPIC RADICAL PROSTATECTOMY Bilateral 01/19/2015   Procedure: ROBOTIC ASSISTED LAPAROSCOPIC RADICAL PROSTATECTOMY AND BILATERAL PELVIC LYMPH NODE DISSECTION, LAPARASCOPIC  LYSIS OF  ADHESIONS;  Surgeon: Morene LELON Salines, MD;  Location: [facility];  Service: Urology;  Laterality: Bilateral;   TRANSURETHRAL RESECTION OF PROSTATE  07/11/2011  Procedure: TRANSURETHRAL RESECTION OF THE PROSTATE (TURP);  Surgeon: Mohammad I Javaid, MD;  Location: [facility];  Service: Urology;  Laterality: N/A;    FAMILY HISTORY: Family History  Problem Relation Age of Onset   Alzheimer's disease Mother    Alzheimer's disease Father    Aneurysm Brother        brain   Anuerysm Brother    Healthy Son    Healthy Son    Colon cancer Neg Hx     SOCIAL HISTORY: Social History   Socioeconomic History   Marital status: Married    Spouse name: Sandy Blouch   Number of children: 2   Years of education: 12   Highest education level: 12th grade  Occupational History   Occupation: farmer    Comment: tobacco; cattle; poultry  Tobacco Use   Smoking status: Former    Current packs/day: 0.00    Average packs/day: 2.0 packs/day for 25.0 years (50.0 ttl pk-yrs)    Types: Cigarettes    Start date: 06/02/1964    Quit date: 06/02/1989    Years since quitting: 34.5    Passive exposure: Past   Smokeless tobacco: Never  Vaping Use   Vaping status: Never Used  Substance and Sexual Activity   Alcohol use: No   Drug use: No   Sexual activity: Not Currently    Partners: Female  Other Topics Concern   Not on file  Social History Narrative   Right handed    Caffeine- none    Lives at home with wife Joen    Social Drivers of Health   Financial Resource Strain: Low Risk  (01/01/2022)   Overall Financial Resource Strain (CARDIA)    Difficulty of Paying Living Expenses: Not hard at all  Food Insecurity: No Food Insecurity (01/01/2022)   Hunger Vital Sign    Worried About Running Out of Food in the Last Year: Never true    Ran Out of Food in the Last Year: Never true  Transportation Needs: No Transportation Needs (01/01/2022)   PRAPARE - Administrator, Civil Service (Medical): No    Lack  of Transportation (Non-Medical): No  Physical Activity: Inactive (01/01/2022)   Exercise Vital Sign    Days of Exercise per Week: 0 days    Minutes of Exercise per Session: 0 min  Stress: No Stress Concern Present (01/01/2022)   [employer] of Occupational Health - Occupational Stress Questionnaire    Feeling of Stress : Only a little  Social Connections: Socially Integrated (01/01/2022)   Social Connection and Isolation Panel    Frequency of Communication with Friends and Family: More than three times a week    Frequency of Social Gatherings with Friends and Family: More than three times a week    Attends Religious Services: More than 4 times per year    Active Member of [employer] or Organizations: Yes    Attends Banker Meetings: More than 4 times per year    Marital Status: Married  Catering manager Violence: Not At Risk (01/01/2022)   Humiliation, Afraid, Rape, and Kick questionnaire    Fear of Current or Ex-Partner: No    Emotionally Abused: No    Physically Abused: No    Sexually Abused: No    PHYSICAL EXAM  GENERAL EXAM/CONSTITUTIONAL: Vitals:  Vitals:   12/01/23 1143 12/01/23 1148  BP: (!) 148/79 126/73  Pulse: 81 81  Resp: 16   Height: 6' 3 (1.905 m)  Body mass index is 30.62 kg/m. Wt Readings from Last 3 Encounters:  11/25/23 245 lb (111.1 kg)  10/07/23 246 lb (111.6 kg)  03/19/23 246 lb (111.6 kg)   Patient is in no distress; well developed, nourished and groomed; neck is supple  MUSCULOSKELETAL: Gait, strength, tone, movements noted in Neurologic exam below  NEUROLOGIC: MENTAL STATUS:      No data to display         awake, alert, oriented to person, Difficulty with recent memory  Difficulty with attention  language fluent, comprehension intact, naming intact fund of knowledge appropriate  CRANIAL NERVE:  2nd, 3rd, 4th, 6th - visual fields full to confrontation, extraocular muscles intact, no nystagmus 5th - facial sensation  symmetric 7th - facial strength symmetric 8th - hearing intact 9th - palate elevates symmetrically, uvula midline 11th - shoulder shrug symmetric 12th - tongue protrusion midline  MOTOR:  normal bulk and tone, full strength in the BUE, BLE  SENSORY:  Decrease sensation to pinprick and vibration to bilateral feet up to ankle. Normal proprioception.   COORDINATION:  finger-nose-finger, fine finger movements normal  GAIT/STATION:  Wide based, slow and unsteady   DIAGNOSTIC DATA (LABS, IMAGING, TESTING) - I reviewed patient records, labs, notes, testing and imaging myself where available.  Lab Results  Component Value Date   WBC 9.2 11/25/2023   HGB 12.8 (L) 11/25/2023   HCT 39.4 11/25/2023   MCV 91.8 11/25/2023   PLT 222 11/25/2023      Component Value Date/Time   NA 139 11/25/2023 1852   NA 144 03/20/2022 1339   K 3.5 11/25/2023 1852   CL 102 11/25/2023 1852   CO2 26 11/25/2023 1852   GLUCOSE 107 (H) 11/25/2023 1852   BUN 15 11/25/2023 1852   BUN 11 03/20/2022 1339   CREATININE 0.88 11/25/2023 1852   CALCIUM  8.5 (L) 11/25/2023 1852   PROT 6.7 11/25/2023 1852   PROT 6.4 03/20/2022 1339   ALBUMIN 3.3 (L) 11/25/2023 1852   ALBUMIN 4.2 03/20/2022 1339   AST 31 11/25/2023 1852   ALT 16 11/25/2023 1852   ALKPHOS 68 11/25/2023 1852   BILITOT 0.8 11/25/2023 1852   BILITOT 0.2 03/20/2022 1339   GFRNONAA >60 11/25/2023 1852   GFRAA >60 02/07/2020 1123   Lab Results  Component Value Date   CHOL 167 01/26/2015   HDL 26 (L) 01/26/2015   LDLCALC 111 (H) 01/26/2015   TRIG 151 (H) 01/26/2015   CHOLHDL 6.4 01/26/2015   Lab Results  Component Value Date   HGBA1C 5.4 03/20/2022   Lab Results  Component Value Date   VITAMINB12 >2000 (H) 03/20/2022   Lab Results  Component Value Date   TSH 2.970 03/20/2022    Head CT 06/25/2020 1. No acute intracranial abnormalities. 2. Chronic atrophy and small vessel ischemic changes.   ASSESSMENT AND PLAN  78 y.o. year  old male with history of colon cancer, BPH, seizure disorder, MCI who is presenting for follow up for his peripheral neuropathy, seizure and MCI.  Plan will be to continue Depakote 250 mg twice daily for seizures, gabapentin  600 mg nightly for his neuropathy, continue Namenda  and add Exelon, as patient could not tolerate Aricept , for his MCI.   For his gait abnormality and multiple falls I will refer him to physical therapy.  Also discussed need for patient to remain well-hydrated.  We discussed drinking at least 32 hours of fluid and adding electrolyte packet to his water .  He voiced understanding.  I  will see him in 1 year for follow-up but he understands to contact me sooner for any additional questions or concerns.   1. Mild cognitive impairment   2. Neuropathy   3. Seizure disorder (HCC)   4. Multiple falls   5. Gait abnormality      Patient Instructions  Increase water  intake, at least 32 ounces of water  daily, consider adding electrolytes pack to your water   Referral to physical therapy for gait training and multiple falls  Start Exelon 1.5 mg twice daily. Could not tolerate Aricept    Return in a year or sooner if worse   Orders Placed This Encounter  Procedures   Ambulatory referral to Physical Therapy    Meds ordered this encounter  Medications   divalproex (DEPAKOTE) 250 MG DR tablet    Sig: Take 1 tablet (250 mg total) by mouth in the morning and at bedtime.    Dispense:  180 tablet    Refill:  3   rivastigmine (EXELON) 1.5 MG capsule    Sig: Take 1 capsule (1.5 mg total) by mouth 2 (two) times daily.    Dispense:  180 capsule    Refill:  3   gabapentin  (NEURONTIN ) 300 MG capsule    Sig: Take 1 capsule (300 mg total) by mouth 3 (three) times daily.    Dispense:  540 capsule    Refill:  2   memantine  (NAMENDA ) 10 MG tablet    Sig: Take 1 tablet (10 mg total) by mouth 2 (two) times daily.    Dispense:  180 tablet    Refill:  3    This prescription was filled on  06/06/2023. Any refills authorized will be placed on file.    Return in about 1 year (around 11/30/2024).  I have spent a total of 45 minutes dedicated to this patient today, preparing to see patient, performing a medically appropriate examination and evaluation, ordering tests and/or medications and procedures, and counseling and educating the patient/family/caregiver; independently interpreting result and communicating results to the family/patient/caregiver; and documenting clinical information in the electronic medical record.   Pastor Falling, MD 12/01/2023, 1:39 PM  [facility] [address] [place], [place] [address] [phone]

## 2023-12-09 NOTE — Therapy (Signed)
 OUTPATIENT PHYSICAL THERAPY EVALUATION   Patient Name: William Burns MRN: [id number] DOB:1945/11/21, 78 y.o., male Today's Date: 12/09/2023  END OF SESSION:   Past Medical History:  Diagnosis Date   BPH (benign prostatic hyperplasia)    Cancer (HCC)    colon   Cataract    Colonic mass 07/03/2011   Difficult intubation    extra long tube for intub per wife; states his neck is longer than regular people and slow to wake up    Erosive gastritis 07/03/2011   GERD (gastroesophageal reflux disease)    History of kidney stones    Kidney stones 2011   Renal calculus    Tooth infection 07/03/2011   Past Surgical History:  Procedure Laterality Date   CATARACT EXTRACTION W/PHACO Left 08/23/2012   Procedure: CATARACT EXTRACTION PHACO AND INTRAOCULAR LENS PLACEMENT (IOC);  Surgeon: Cherene Mania, MD;  Location: [facility];  Service: Ophthalmology;  Laterality: Left;  CDE 16.64   CATARACT EXTRACTION W/PHACO Right 06/09/2013   Procedure: CATARACT EXTRACTION PHACO AND INTRAOCULAR LENS PLACEMENT (IOC) CDE=10.86;  Surgeon: Cherene Mania, MD;  Location: [facility];  Service: Ophthalmology;  Laterality: Right;   COLON SURGERY     COLONOSCOPY  07/03/2011   Procedure: COLONOSCOPY;  Surgeon: Claudis RAYMOND Rivet, MD;  Location: [facility];  Service: Endoscopy;  Laterality: N/A;   COLONOSCOPY N/A 09/16/2012   Procedure: COLONOSCOPY;  Surgeon: Claudis RAYMOND Rivet, MD;  [facility]: [facility];  Service: Endoscopy;  Laterality: N/A;  930   COLONOSCOPY N/A 12/05/2015   Procedure: COLONOSCOPY;  Surgeon: Claudis RAYMOND Rivet, MD;  [facility]: [facility];  Service: Endoscopy;  Laterality: N/A;  1200   COLONOSCOPY WITH PROPOFOL  N/A 02/06/2021   Procedure: COLONOSCOPY WITH PROPOFOL ;  Surgeon: Rivet Claudis RAYMOND, MD;  [facility]: [facility];  Service: Endoscopy;  Laterality: N/A;  12:50   CYSTOSCOPY  07/09/2011   Procedure: CYSTOSCOPY FLEXIBLE;  Surgeon: Emery LILLETTE Blaze, MD;  Location: [facility];  Service: Urology;  Laterality: N/A;    CYSTOSCOPY WITH LITHOLAPAXY  07/11/2011   Procedure: CYSTOSCOPY WITH LITHOLAPAXY;  Surgeon: Emery LILLETTE Blaze, MD;  Location: [facility];  Service: Urology;  Laterality: N/A;   ESOPHAGOGASTRODUODENOSCOPY  07/03/2011   Procedure: ESOPHAGOGASTRODUODENOSCOPY (EGD);  Surgeon: Claudis RAYMOND Rivet, MD;  [facility]: [facility];  Service: Endoscopy;  Laterality: N/A;   HERNIA REPAIR  2008    umbilical    INGUINAL HERNIA REPAIR Right 05/04/2017   Procedure: HERNIA REPAIR INGUINAL ADULT WITH MESH;  Surgeon: Mavis Anes, MD;  Location: [facility];  Service: General;  Laterality: Right;   PARTIAL COLECTOMY  07/04/2011   Procedure: PARTIAL COLECTOMY;  Surgeon: Anes DELENA Mavis, MD;  Location: [facility];  Service: General;  Laterality: N/A;   POLYPECTOMY  02/06/2021   Procedure: POLYPECTOMY;  Surgeon: Rivet Claudis RAYMOND, MD;  [facility]: [facility];  Service: Endoscopy;;   Prostage cancer     PROSTATE BIOPSY     ROBOT ASSISTED LAPAROSCOPIC RADICAL PROSTATECTOMY Bilateral 01/19/2015   Procedure: ROBOTIC ASSISTED LAPAROSCOPIC RADICAL PROSTATECTOMY AND BILATERAL PELVIC LYMPH NODE DISSECTION, LAPARASCOPIC  LYSIS OF ADHESIONS;  Surgeon: Morene LELON Salines, MD;  Location: [facility];  Service: Urology;  Laterality: Bilateral;   TRANSURETHRAL RESECTION OF PROSTATE  07/11/2011   Procedure: TRANSURETHRAL RESECTION OF THE PROSTATE (TURP);  Surgeon: Mohammad I Javaid, MD;  Location: [facility];  Service: Urology;  Laterality: N/A;   Patient Active Problem List   Diagnosis Date Noted   Left thyroid  nodule 01/13/2019  Non-recurrent unilateral inguinal hernia without obstruction or gangrene    GERD (gastroesophageal reflux disease) 01/26/2015   TIA (transient ischemic attack)    Transient global amnesia 01/25/2015   Prostate cancer (HCC) 08/29/2014   Kidney stone on left side 08/02/2013   Diarrhea 07/05/2013   Short-segment Barrett's esophagus 02/09/2012   Calculus, bladder 07/04/2011   Tooth infection 07/03/2011   Adenocarcinoma of colon (HCC)  07/03/2011   Erosive gastritis 07/03/2011   Microcytic hypochromic anemia 07/02/2011   Hx of colonic polyp 07/02/2011   Hyperglycemia 07/02/2011   BPH (benign prostatic hyperplasia) 07/02/2011    PCP: Shona Norleen PEDLAR, MD   REFERRING PROVIDER: Gregg Lek, MD   REFERRING DIAG:  R29.6 (ICD-10-CM) - Multiple falls  R26.9 (ICD-10-CM) - Gait abnormality    Rationale for Evaluation and Treatment:  Rehabiliation  THERAPY DIAG:  No diagnosis found.  ONSET DATE: ***   SUBJECTIVE:                                                                                                                                                                                           SUBJECTIVE STATEMENT: most the time he uses a device with ambulation but sometimes he will not use a device and will fall, last fall was last week when patient was found by her son next to his tractor.   PERTINENT HISTORY:  See above PMH  PAIN: *** NPRS scale: /10 upon arrival Pain location: Pain description: constant, achy, sharp Aggravating factors:  Relieving factors: rest, meds   PRECAUTIONS: ,  Fall  RED FLAGS: None   WEIGHT BEARING RESTRICTIONS:  No  FALLS:  Has patient fallen in last 6 months? Yes. Number of falls ***   PLOF:  {PLOF:24004}  PATIENT GOALS:  ***  OBJECTIVE:  Note: Objective measures were completed at Evaluation unless otherwise noted.  DIAGNOSTIC FINDINGS:  ***  PATIENT SURVEYS:  Patient-Specific Activity Scoring Scheme  0 represents "unable to perform." 10 represents "able to perform at prior level. 0 1 2 3 4 5 6 7 8 9  10 (Date and Score)   Activity Eval     1. ***      2. ***      3. ***    4.    5.    Score ***    Total score = sum of the activity scores/number of activities Minimum detectable change (90%CI) for average score = 2 points Minimum detectable change (90%CI) for single activity score = 3 points   POSTURE:   {posture:25561}  GAIT: Distance walked: *** Assistive device utilized: {Assistive devices:23999} Level of assistance: {Levels of assistance:24026} Comments: ***  UPPER EXTREMITY MMT:  MMT Right eval Left eval  Shoulder flexion    Shoulder extension    Shoulder abduction    Shoulder adduction    Shoulder extension    Shoulder internal rotation    Shoulder external rotation    Middle trapezius    Lower trapezius    Elbow flexion    Elbow extension    Wrist flexion    Wrist extension    Wrist ulnar deviation    Wrist radial deviation    Wrist pronation    Wrist supination    Grip strength     (Blank rows = not tested)   LOWER EXTREMITY MMT:     MMT in stitting  Right eval Left eval  Hip flexion    Hip extension    Hip abduction    Hip adduction    Hip internal rotation    Hip external rotation    Knee flexion    Knee extension    Ankle dorsiflexion    Ankle plantarflexion    Ankle inversion    Ankle eversion     (Blank rows = not tested)    FUNCTIONAL TESTS:  5 times sit to stand: *** Timed up and go (TUG): *** 3 minute walk test: *** 6 minute walk test: *** Berg Balance Scale:  Item Test date: *** Test date: *** Test date: ***  Sitting to standing {berg sit to stand:32780} Insert OPRCBERGREEVAL SmartPhrase at re-test date Insert OPRCBERGREEVAL SmartPhrase at re-test date  2. Standing unsupported {berg stand unsupported:32781}    3. Sitting with back unsupported, feet supported {berg sit unsupported:32782}    4. Standing to sitting {berg stand to sit:32783}    5. Pivot transfer  {berg transfers:32784}    6. Standing unsupported with eyes closed {berg stand unsupported EC:32785}    7. Standing unsupported with feet together {berg stand unsupported feet together:32786}    8. Reaching forward with outstretched arms while standing {berg reaching:32787}    9. Pick up object from the floor from standing {berg object from floor:32788}    10. Turning to  look behind over left and right shoulders while standing {bern turn to look:32789}    11. Turn 360 degrees {berg turn 360:32790}    12. Place alternate foot on step or stool while standing unsupported {berg alternating foot:32791}    13. Standing unsupported one foot in front {berg tandem:32792}    14. Standing on one leg {berg SLS:32793}     Total Score ***/56 Total Score ***/56 Total Score ***/56    Dynamic Gait Index: Dynamic Gait Index  Mark the lowest level that applies.   Date Performed ***  Gait level surface {DGI gait level surface:32994}  2. Change in gait speed {DGI change in gait speed:32995}  3. Gait with horizontal head turns {DGI gait with horizontal head turns:32996}  4. Gait with vertical head turns {DGI gait with vertical head turns:32997}  5. Gait and pivot turn {DGI gait and pivot turn:32998}  6. Step over obstacle {DGI step over obstacle:32999}  7. Step around obstacle {DGI step around obstacles:33000}  8. Steps {DGI steps:33001}  Total score ***    Score Interpretation: Score of <19 indicates high risk of falls.  Minimally Clinically Important Difference (MCID):  =DGI scores of<21/24 = 1.80 points DGI scores of >21/24 = 0.60 points   Sheffield T, Inbar-Borovsky N, Brozgol M, Giladi N, [place] JM. The Dynamic Gait Index in healthy older adults: the role of stair climbing, fear of  falling and gender. Gait Posture. 2009 Feb;29(2):237-41. doi: 10.1016/j.gaitpost.2008.08.013. Epub 2008 Oct 8. PMID: [id number]; PMCID: [id number].  Pardasaney, MYRTIS LOIS Bonus, GEANNIE POUR., et al. (2012). Sensitivity to change and responsiveness of four balance measures for community-dwelling older adults. Physical therapy 92(3): 388-397.  MCTSIB: {mctsib:25378::Condition 1: Avg of 3 trials: *** sec,Condition 2: Avg of 3 trials: *** sec,Condition 3: Avg of 3 trials: *** sec,Condition 4: Avg of 3 trials: *** sec,Total Score: ***/120}                                                                                                                               TREATMENT DATE:  Eval HEP creation and review with demonstration and trial set preformed, see below for details Selfcare:    PATIENT EDUCATION: Education details: HEP, PT plan of care, selfcare Person educated: Patient Education method: Explanation, Demonstration, Verbal cues, and Handouts Education comprehension: verbalized understanding, further education recommended   HOME EXERCISE PROGRAM: ***  ASSESSMENT:  CLINICAL IMPRESSION: Patient referred to PT for For gait training and multiple falls. Patient will benefit from skilled PT to improve overall function and to address impairments and limitations listed below.  OBJECTIVE IMPAIRMENTS: decreased activity tolerance for ADL's, difficulty walking, decreased balance, decreased endurance, decreased mobility, decreased ROM, decreased strength, impaired flexibility, impaired UE/LE use, and pain.  ACTIVITY LIMITATIONS: bending, lifting, carry, reaching, locomotion, cleaning, community activity, driving, and or occupation  PERSONAL FACTORS: see above PMH  also affecting patient's functional outcome.  REHAB POTENTIAL: {rehabpotential:25112}  CLINICAL DECISION MAKING: {clinical decision making:25114}  EVALUATION COMPLEXITY: {Evaluation complexity:25115}    GOALS: Short term PT Goals Target date: *** Pt will be I and compliant with HEP. Baseline:  Goal status: New Pt will decrease pain by 25% overall Baseline: Goal status: New  Long term PT goals Target date:*** Pt will improve ROM to [facility] to improve functional mobility Baseline: Goal status: New Pt will improve  strength to at least 4+/5 MMT to improve functional strength Baseline: Goal status: New Pt will improve Patient specific functional scale (PSFS) to at least /10 to show improved function level Baseline: Goal status: New Pt will reduce pain to overall less than 3/10 with usual activity and  work activity. Baseline: Goal status: New Pt will be able to ambulate community distances at least 1000 ft WNL gait pattern without complaints Baseline: Goal status: New  PLAN: PT FREQUENCY: 1-3 times per week   PT DURATION: 6-8 weeks  PLANNED INTERVENTIONS (unless contraindicated): aquatic PT, Canalith repositioning, cryotherapy, Electrical stimulation, Iontophoresis with 4 mg/ml dexamethasome, Moist heat, traction, Ultrasound, gait training, Therapeutic exercise, balance training, neuromuscular re-education, patient/family education, manual techniques, passive ROM, dry needling, taping, vasopnuematic device, vestibular, spinal manipulations, joint manipulations {rehab planned interventions:25118::97110-Therapeutic exercises,97530- Therapeutic [phone]- Neuromuscular re-education,97535- Self Rjmz,02859- Manual therapy}  PLAN FOR NEXT SESSION: *** NEXT MD VISIT: PIERRETTE Redell JONELLE Maranda, PT,DPT 12/09/2023, 1:37 PM

## 2023-12-10 ENCOUNTER — Encounter: Payer: Self-pay | Admitting: Physical Therapy

## 2023-12-10 ENCOUNTER — Ambulatory Visit: Attending: Neurology | Admitting: Physical Therapy

## 2023-12-10 DIAGNOSIS — M6281 Muscle weakness (generalized): Secondary | ICD-10-CM | POA: Insufficient documentation

## 2023-12-10 DIAGNOSIS — R296 Repeated falls: Secondary | ICD-10-CM | POA: Diagnosis not present

## 2023-12-10 DIAGNOSIS — R2689 Other abnormalities of gait and mobility: Secondary | ICD-10-CM | POA: Insufficient documentation

## 2023-12-10 DIAGNOSIS — R269 Unspecified abnormalities of gait and mobility: Secondary | ICD-10-CM | POA: Insufficient documentation

## 2023-12-14 ENCOUNTER — Encounter: Payer: Self-pay | Admitting: Physical Therapy

## 2023-12-14 ENCOUNTER — Ambulatory Visit: Admitting: Physical Therapy

## 2023-12-14 DIAGNOSIS — R296 Repeated falls: Secondary | ICD-10-CM

## 2023-12-14 DIAGNOSIS — R269 Unspecified abnormalities of gait and mobility: Secondary | ICD-10-CM | POA: Diagnosis not present

## 2023-12-14 DIAGNOSIS — R2689 Other abnormalities of gait and mobility: Secondary | ICD-10-CM

## 2023-12-14 DIAGNOSIS — M6281 Muscle weakness (generalized): Secondary | ICD-10-CM | POA: Diagnosis not present

## 2023-12-14 NOTE — Therapy (Signed)
 OUTPATIENT PHYSICAL THERAPY TREATMENT   Patient Name: William Burns MRN: [id number] DOB:1946-04-13, 78 y.o., male Today's Date: 12/14/2023  END OF SESSION:  PT End of Session - 12/14/23 1425     Visit Number 2    Number of Visits 12    Date for PT Re-Evaluation 01/21/24    Authorization Type MCD    PT Start Time 1340    PT Stop Time 1425    PT Time Calculation (min) 45 min    Activity Tolerance Patient tolerated treatment well    Behavior During Therapy [facility] for tasks assessed/performed           Past Medical History:  Diagnosis Date   BPH (benign prostatic hyperplasia)    Cancer (HCC)    colon   Cataract    Colonic mass 07/03/2011   Difficult intubation    extra long tube for intub per wife; states his neck is longer than regular people and slow to wake up    Erosive gastritis 07/03/2011   GERD (gastroesophageal reflux disease)    History of kidney stones    Kidney stones 2011   Renal calculus    Tooth infection 07/03/2011   Past Surgical History:  Procedure Laterality Date   CATARACT EXTRACTION W/PHACO Left 08/23/2012   Procedure: CATARACT EXTRACTION PHACO AND INTRAOCULAR LENS PLACEMENT (IOC);  Surgeon: Cherene Mania, MD;  Location: [facility];  Service: Ophthalmology;  Laterality: Left;  CDE 16.64   CATARACT EXTRACTION W/PHACO Right 06/09/2013   Procedure: CATARACT EXTRACTION PHACO AND INTRAOCULAR LENS PLACEMENT (IOC) CDE=10.86;  Surgeon: Cherene Mania, MD;  Location: [facility];  Service: Ophthalmology;  Laterality: Right;   COLON SURGERY     COLONOSCOPY  07/03/2011   Procedure: COLONOSCOPY;  Surgeon: Claudis RAYMOND Rivet, MD;  [facility];  Service: Endoscopy;  Laterality: N/A;   COLONOSCOPY N/A 09/16/2012   Procedure: COLONOSCOPY;  Surgeon: Claudis RAYMOND Rivet, MD;  [facility];  Service: Endoscopy;  Laterality: N/A;  930   COLONOSCOPY N/A 12/05/2015   Procedure: COLONOSCOPY;  Surgeon: Claudis RAYMOND Rivet, MD;  [facility];  Service: Endoscopy;   Laterality: N/A;  1200   COLONOSCOPY WITH PROPOFOL  N/A 02/06/2021   Procedure: COLONOSCOPY WITH PROPOFOL ;  Surgeon: Rivet Claudis RAYMOND, MD;  [facility];  Service: Endoscopy;  Laterality: N/A;  12:50   CYSTOSCOPY  07/09/2011   Procedure: CYSTOSCOPY FLEXIBLE;  Surgeon: Emery LILLETTE Blaze, MD;  Location: [facility];  Service: Urology;  Laterality: N/A;   CYSTOSCOPY WITH LITHOLAPAXY  07/11/2011   Procedure: CYSTOSCOPY WITH LITHOLAPAXY;  Surgeon: Emery LILLETTE Blaze, MD;  Location: [facility];  Service: Urology;  Laterality: N/A;   ESOPHAGOGASTRODUODENOSCOPY  07/03/2011   Procedure: ESOPHAGOGASTRODUODENOSCOPY (EGD);  Surgeon: Claudis RAYMOND Rivet, MD;  [facility];  Service: Endoscopy;  Laterality: N/A;   HERNIA REPAIR  2008    umbilical    INGUINAL HERNIA REPAIR Right 05/04/2017   Procedure: HERNIA REPAIR INGUINAL ADULT WITH MESH;  Surgeon: Mavis Anes, MD;  Location: [facility];  Service: General;  Laterality: Right;   PARTIAL COLECTOMY  07/04/2011   Procedure: PARTIAL COLECTOMY;  Surgeon: Anes DELENA Mavis, MD;  Location: [facility];  Service: General;  Laterality: N/A;   POLYPECTOMY  02/06/2021   Procedure: POLYPECTOMY;  Surgeon: Rivet Claudis RAYMOND, MD;  [facility];  Service: Endoscopy;;   Prostage cancer     PROSTATE BIOPSY     ROBOT ASSISTED LAPAROSCOPIC RADICAL PROSTATECTOMY Bilateral 01/19/2015  Procedure: ROBOTIC ASSISTED LAPAROSCOPIC RADICAL PROSTATECTOMY AND BILATERAL PELVIC LYMPH NODE DISSECTION, LAPARASCOPIC  LYSIS OF ADHESIONS;  Surgeon: Morene LELON Salines, MD;  Location: [facility];  Service: Urology;  Laterality: Bilateral;   TRANSURETHRAL RESECTION OF PROSTATE  07/11/2011   Procedure: TRANSURETHRAL RESECTION OF THE PROSTATE (TURP);  Surgeon: Mohammad I Javaid, MD;  Location: [facility];  Service: Urology;  Laterality: N/A;   Patient Active Problem List   Diagnosis Date Noted   Left thyroid  nodule 01/13/2019   Non-recurrent unilateral inguinal hernia without obstruction or gangrene    GERD  (gastroesophageal reflux disease) 01/26/2015   TIA (transient ischemic attack)    Transient global amnesia 01/25/2015   Prostate cancer (HCC) 08/29/2014   Kidney stone on left side 08/02/2013   Diarrhea 07/05/2013   Short-segment Barrett's esophagus 02/09/2012   Calculus, bladder 07/04/2011   Tooth infection 07/03/2011   Adenocarcinoma of colon (HCC) 07/03/2011   Erosive gastritis 07/03/2011   Microcytic hypochromic anemia 07/02/2011   Hx of colonic polyp 07/02/2011   Hyperglycemia 07/02/2011   BPH (benign prostatic hyperplasia) 07/02/2011    PCP: Shona Norleen PEDLAR, MD   REFERRING PROVIDER: Shona Norleen PEDLAR, MD   REFERRING DIAG:  R29.6 (ICD-10-CM) - Multiple falls  R26.9 (ICD-10-CM) - Gait abnormality    Rationale for Evaluation and Treatment:  Rehabiliation  THERAPY DIAG:  Other abnormalities of gait and mobility  Muscle weakness (generalized)  Repeated falls  ONSET DATE: Fall 2 weeks ago from 12/10/23   SUBJECTIVE:                                                                                                                                                                                           SUBJECTIVE STATEMENT: Relays he has been drinking more water  as recommended by PT. He relays he has done his exercises at home. He denies pain  PERTINENT HISTORY:  See above PMH  PAIN:  Denies pain   PRECAUTIONS: ,  Fall  RED FLAGS: None   WEIGHT BEARING RESTRICTIONS:  No  FALLS:  Has patient fallen in last 6 months? Yes. 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  DIAGNOSTIC FINDINGS:  Head CT 2024 IMPRESSION: No acute intracranial abnormality  PATIENT SURVEYS:  Patient-Specific Activity Scoring Scheme  0 represents "unable to perform." 10 represents "able to  perform at prior level. 0 1 2 3 4 5 6 7 8 9  10 (Date and Score)   Activity  Eval     1. Getting up from chair 4     2. walking 4                 Score 4/10    Total score = sum of the activity scores/number of activities Minimum detectable change (90%CI) for average score = 2 points Minimum detectable change (90%CI) for single activity score = 3 points   GAIT: Eval: Distance walked: 25 feet Assistive device utilized: None, but does use RW at home sometimes Level of assistance: SBA Comments: slow speed shuffling gait.     UPPER EXTREMITY MMT: Grossly 4+/5 MMT bilat     LOWER EXTREMITY MMT:   Grossly 4/5 MMT bilat   FUNCTIONAL TESTS:  Eval: 5 times sit to stand: 28 seconds Timed up and go (TUG): 24.09 3 minute walk test: 190 feet, HR 61, SPO2 96%  Berg Balance Scale:  Item Test date: 12/10/23 Test date:  Test date:   Sitting to standing 4. able to stand without using hands and stabilize independently Insert OPRCBERGREEVAL SmartPhrase at re-test date   2. Standing unsupported 4. able to stand safely for 2 minutes    3. Sitting with back unsupported, feet supported 4. able to sit safely and securely for 2 minutes    4. Standing to sitting 3. controls descent by using hands    5. Pivot transfer  4. able to transfer safely with minor use of hands    6. Standing unsupported with eyes closed 3. able to stand 10 seconds with supervision    7. Standing unsupported with feet together 3. able to place feet together independently and stand 1 minute with supervision    8. Reaching forward with outstretched arms while standing 4. can reach forward confidently 25 cm (10 inches)    9. Pick up object from the floor from standing 3. able to pick up slipper but needs supervision    10. Turning to look behind over left and right shoulders while standing 2. turns sideways but only maintains balance    11. Turn 360 degrees 2. able to turn 360 degrees safely but slowly    12. Place  alternate foot on step or stool while standing unsupported 1. able to complete > 2 steps needs minimal assist    13. Standing unsupported one foot in front 0. loses balance while stepping or standing    14. Standing on one leg 0. unable to try of needs assist to prevent fall      Total Score 37/56 Total Score /56 Total Score /56                                                                                                                                  TREATMENT DATE:  12/14/23 Therex Nu step L1 X 10 min UE/LE seat #14  Neuromuscular re-ed Heel and toe raises X 15 reps Walking march in bars 4 round  trips with UE support (cues for big steps) Standing tandem balance 30 sec X 3 bilat Backward walking in bars 4 round trips  without UE support (cues for bigger steps) Blaze pods SLS 3 pods 30 sec X 3 bilat Sit to stand 2 sets of 5, power up and then slow lower, from 23 inch   Eval HEP creation and review with demonstration and trial set preformed, see below for details Selfcare: education an importance of drinking more water , with goal of 1 gallon per day. The importance of HEP on his overall progress.     PATIENT EDUCATION: Education details: HEP, PT plan of care, selfcare Person educated: Patient Education method: Explanation, Demonstration, Verbal cues, and Handouts Education comprehension: verbalized understanding, further education recommended   HOME EXERCISE PROGRAM: Access Code: [email or web address] URL: [email or web address] Date: 12/10/2023 Prepared by: Redell Moose  Exercises - Sit to Stand - Increased Speed  - 2 x daily - 6 x weekly - 1 sets - 10 reps - Heel Toe Raises with Counter Support  - 2 x daily - 6 x weekly - 2 sets - 10 reps - Walking March  - 2 x daily - 6 x weekly - 3 sets - 10 reps - Standing Tandem Balance with Counter Support  - 2 x daily - 6 x weekly - 1 sets - 3 reps - 30 sec hold - Backward Walking with Counter Support  - 2 x daily - 6 x  weekly - 3 sets - 10 reps  ASSESSMENT:  CLINICAL IMPRESSION: Session focused on HEP review, balance and gait training. He needs max cuing for longer and bigger steps with gait.    Per eval: Patient referred to PT for gait training after 2 recent falls. He has slow gait speed and shuffles feet versus picking them up (resembles parkinsons like gait). He does not report any tremors though. He does have history of seizure. His balance testing today do place him at an increased risk of falling. Patient will benefit from skilled PT to improve overall function and to address impairments and limitations listed below.  OBJECTIVE IMPAIRMENTS: decreased activity tolerance for ADL's, difficulty walking, decreased balance, decreased endurance, decreased mobility, decreased ROM, decreased strength, impaired flexibility, and pain.  ACTIVITY LIMITATIONS: bending, lifting, carry, locomotion, PERSONAL FACTORS: see above PMH  also affecting patient's functional outcome.  REHAB POTENTIAL: Good  CLINICAL DECISION MAKING: Evolving/moderate complexity  EVALUATION COMPLEXITY: Moderate    GOALS: Short term PT Goals Target date: 01/07/2024   Pt will be I and compliant with HEP. Baseline: no HEP Goal status: New Pt will improve 5 times sit to stand test to less than 23 seconds to show improved leg strength and balance Baseline:28 seconds Goal status: New  Long term PT goals Target date:02/04/2024   Pt will improve Timed up and go (TUG) time to less than 18 seconds and also 5 times sit to stand test to less than 18 seconds to show improved gait speed and balance Baseline:24 sec Goal status: New Pt will improve  strength to at least 4+/5 MMT to improve functional strength Baseline:4/5 Goal status: New Pt will improve Patient specific functional scale (PSFS) to at least 7 /10 to show improved function level Baseline:4/10 Goal status: New Pt will improve 3 minute walk test to >250 feet to improve  community access Baseline:190 feet Goal status: New Pt will improve BERG balance test to >45/56 to show improved balance Baseline:37/56 Goal status: New  PLAN: PT FREQUENCY: 1-3 times per week  PT DURATION: 4-6 weeks  PLANNED INTERVENTIONS (unless contraindicated):  [id number]-Therapeutic exercises, 97530- Therapeutic activity, [id number]- Neuromuscular re-education, 97535- Self Care, [id number]- Manual therapy, [phone]- Gait training, and Balance training  PLAN FOR NEXT SESSION: review HEP, work on gait, balance, leg strength, big movements.  NEXT MD VISIT: December 09, 2024  For all possible CPT codes, reference the Planned Interventions line above.     Check all conditions that are expected to impact treatment: {Conditions expected to impact treatment:Musculoskeletal disorders and Neurological condition and/or seizures   If treatment provided at initial evaluation, no treatment charged due to lack of authorization.       Redell JONELLE Moose, PT,DPT 12/14/2023, 2:25 PM

## 2023-12-15 ENCOUNTER — Telehealth: Payer: Self-pay | Admitting: Neurology

## 2023-12-15 NOTE — Telephone Encounter (Signed)
 Agree with UC or PCP to rule out infection since there is a strong smell of urine.
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
## 2023-12-16 NOTE — Telephone Encounter (Signed)
 Call to wife no answer. Unable to leave message

## 2023-12-18 ENCOUNTER — Ambulatory Visit: Admitting: Physical Therapy

## 2023-12-18 ENCOUNTER — Encounter: Payer: Self-pay | Admitting: Physical Therapy

## 2023-12-18 DIAGNOSIS — R296 Repeated falls: Secondary | ICD-10-CM

## 2023-12-18 DIAGNOSIS — R2689 Other abnormalities of gait and mobility: Secondary | ICD-10-CM

## 2023-12-18 DIAGNOSIS — M6281 Muscle weakness (generalized): Secondary | ICD-10-CM

## 2023-12-18 DIAGNOSIS — R269 Unspecified abnormalities of gait and mobility: Secondary | ICD-10-CM | POA: Diagnosis not present

## 2023-12-18 NOTE — Therapy (Signed)
 OUTPATIENT PHYSICAL THERAPY TREATMENT   Patient Name: William Burns MRN: [id number] DOB:05/14/1946, 78 y.o., male Today's Date: 12/18/2023  END OF SESSION:  PT End of Session - 12/18/23 1204     Visit Number 3    Number of Visits 12    Date for PT Re-Evaluation 01/21/24    Authorization Type MCD    PT Start Time 1100    PT Stop Time 1140    PT Time Calculation (min) 40 min    Activity Tolerance Patient tolerated treatment well    Behavior During Therapy [facility] for tasks assessed/performed            Past Medical History:  Diagnosis Date   BPH (benign prostatic hyperplasia)    Cancer (HCC)    colon   Cataract    Colonic mass 07/03/2011   Difficult intubation    extra long tube for intub per wife; states his neck is longer than regular people and slow to wake up    Erosive gastritis 07/03/2011   GERD (gastroesophageal reflux disease)    History of kidney stones    Kidney stones 2011   Renal calculus    Tooth infection 07/03/2011   Past Surgical History:  Procedure Laterality Date   CATARACT EXTRACTION W/PHACO Left 08/23/2012   Procedure: CATARACT EXTRACTION PHACO AND INTRAOCULAR LENS PLACEMENT (IOC);  Surgeon: Cherene Mania, MD;  Location: [facility];  Service: Ophthalmology;  Laterality: Left;  CDE 16.64   CATARACT EXTRACTION W/PHACO Right 06/09/2013   Procedure: CATARACT EXTRACTION PHACO AND INTRAOCULAR LENS PLACEMENT (IOC) CDE=10.86;  Surgeon: Cherene Mania, MD;  Location: [facility];  Service: Ophthalmology;  Laterality: Right;   COLON SURGERY     COLONOSCOPY  07/03/2011   Procedure: COLONOSCOPY;  Surgeon: Claudis RAYMOND Rivet, MD;  [facility]: [facility];  Service: Endoscopy;  Laterality: N/A;   COLONOSCOPY N/A 09/16/2012   Procedure: COLONOSCOPY;  Surgeon: Claudis RAYMOND Rivet, MD;  [facility];  Service: Endoscopy;  Laterality: N/A;  930   COLONOSCOPY N/A 12/05/2015   Procedure: COLONOSCOPY;  Surgeon: Claudis RAYMOND Rivet, MD;  [facility];  Service: Endoscopy;   Laterality: N/A;  1200   COLONOSCOPY WITH PROPOFOL  N/A 02/06/2021   Procedure: COLONOSCOPY WITH PROPOFOL ;  Surgeon: Rivet Claudis RAYMOND, MD;  [facility];  Service: Endoscopy;  Laterality: N/A;  12:50   CYSTOSCOPY  07/09/2011   Procedure: CYSTOSCOPY FLEXIBLE;  Surgeon: Emery LILLETTE Blaze, MD;  Location: [facility];  Service: Urology;  Laterality: N/A;   CYSTOSCOPY WITH LITHOLAPAXY  07/11/2011   Procedure: CYSTOSCOPY WITH LITHOLAPAXY;  Surgeon: Emery LILLETTE Blaze, MD;  Location: [facility];  Service: Urology;  Laterality: N/A;   ESOPHAGOGASTRODUODENOSCOPY  07/03/2011   Procedure: ESOPHAGOGASTRODUODENOSCOPY (EGD);  Surgeon: Claudis RAYMOND Rivet, MD;  [facility];  Service: Endoscopy;  Laterality: N/A;   HERNIA REPAIR  2008    umbilical    INGUINAL HERNIA REPAIR Right 05/04/2017   Procedure: HERNIA REPAIR INGUINAL ADULT WITH MESH;  Surgeon: Mavis Anes, MD;  Location: [facility];  Service: General;  Laterality: Right;   PARTIAL COLECTOMY  07/04/2011   Procedure: PARTIAL COLECTOMY;  Surgeon: Anes DELENA Mavis, MD;  Location: [facility];  Service: General;  Laterality: N/A;   POLYPECTOMY  02/06/2021   Procedure: POLYPECTOMY;  Surgeon: Rivet Claudis RAYMOND, MD;  [facility];  Service: Endoscopy;;   Prostage cancer     PROSTATE BIOPSY     ROBOT ASSISTED LAPAROSCOPIC RADICAL PROSTATECTOMY Bilateral 01/19/2015  Procedure: ROBOTIC ASSISTED LAPAROSCOPIC RADICAL PROSTATECTOMY AND BILATERAL PELVIC LYMPH NODE DISSECTION, LAPARASCOPIC  LYSIS OF ADHESIONS;  Surgeon: Morene LELON Salines, MD;  Location: [facility];  Service: Urology;  Laterality: Bilateral;   TRANSURETHRAL RESECTION OF PROSTATE  07/11/2011   Procedure: TRANSURETHRAL RESECTION OF THE PROSTATE (TURP);  Surgeon: Mohammad I Javaid, MD;  Location: [facility];  Service: Urology;  Laterality: N/A;   Patient Active Problem List   Diagnosis Date Noted   Left thyroid  nodule 01/13/2019   Non-recurrent unilateral inguinal hernia without obstruction or gangrene    GERD  (gastroesophageal reflux disease) 01/26/2015   TIA (transient ischemic attack)    Transient global amnesia 01/25/2015   Prostate cancer (HCC) 08/29/2014   Kidney stone on left side 08/02/2013   Diarrhea 07/05/2013   Short-segment Barrett's esophagus 02/09/2012   Calculus, bladder 07/04/2011   Tooth infection 07/03/2011   Adenocarcinoma of colon (HCC) 07/03/2011   Erosive gastritis 07/03/2011   Microcytic hypochromic anemia 07/02/2011   Hx of colonic polyp 07/02/2011   Hyperglycemia 07/02/2011   BPH (benign prostatic hyperplasia) 07/02/2011    PCP: Shona Norleen PEDLAR, MD   REFERRING PROVIDER: Shona Norleen PEDLAR, MD   REFERRING DIAG:  R29.6 (ICD-10-CM) - Multiple falls  R26.9 (ICD-10-CM) - Gait abnormality    Rationale for Evaluation and Treatment:  Rehabiliation  THERAPY DIAG:  Other abnormalities of gait and mobility  Muscle weakness (generalized)  Repeated falls  ONSET DATE: Fall 2 weeks ago from 12/10/23   SUBJECTIVE:                                                                                                                                                                                           SUBJECTIVE STATEMENT: He denies pain, he relays compliance with HEP and drinking water  but his wife disagrees  PERTINENT HISTORY:  See above PMH  PAIN:  Denies pain   PRECAUTIONS: ,  Fall  RED FLAGS: None   WEIGHT BEARING RESTRICTIONS:  No  FALLS:  Has patient fallen in last 6 months? Yes. 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  DIAGNOSTIC FINDINGS:  Head CT 2024 IMPRESSION: No acute intracranial abnormality  PATIENT SURVEYS:  Patient-Specific Activity Scoring Scheme  0 represents "unable to perform." 10 represents "able to perform at prior level. 0 1 2 3  4 5 6 7 8 9 10  (Date and Score)   Activity Eval     1. Getting up  from chair 4     2. walking 4                 Score 4/10    Total score = sum of the activity scores/number of activities Minimum detectable change (90%CI) for average score = 2 points Minimum detectable change (90%CI) for single activity score = 3 points   GAIT: Eval: Distance walked: 25 feet Assistive device utilized: None, but does use RW at home sometimes Level of assistance: SBA Comments: slow speed shuffling gait.     UPPER EXTREMITY MMT: Grossly 4+/5 MMT bilat     LOWER EXTREMITY MMT:   Grossly 4/5 MMT bilat   FUNCTIONAL TESTS:  Eval: 5 times sit to stand: 28 seconds Timed up and go (TUG): 24.09 3 minute walk test: 190 feet, HR 61, SPO2 96%  Berg Balance Scale:  Item Test date: 12/10/23 Test date:  Test date:   Sitting to standing 4. able to stand without using hands and stabilize independently Insert OPRCBERGREEVAL SmartPhrase at re-test date   2. Standing unsupported 4. able to stand safely for 2 minutes    3. Sitting with back unsupported, feet supported 4. able to sit safely and securely for 2 minutes    4. Standing to sitting 3. controls descent by using hands    5. Pivot transfer  4. able to transfer safely with minor use of hands    6. Standing unsupported with eyes closed 3. able to stand 10 seconds with supervision    7. Standing unsupported with feet together 3. able to place feet together independently and stand 1 minute with supervision    8. Reaching forward with outstretched arms while standing 4. can reach forward confidently 25 cm (10 inches)    9. Pick up object from the floor from standing 3. able to pick up slipper but needs supervision    10. Turning to look behind over left and right shoulders while standing 2. turns sideways but only maintains balance    11. Turn 360 degrees 2. able to turn 360 degrees safely but slowly    12. Place alternate foot on step or stool while  standing unsupported 1. able to complete > 2 steps needs minimal assist    13. Standing unsupported one foot in front 0. loses balance while stepping or standing    14. Standing on one leg 0. unable to try of needs assist to prevent fall      Total Score 37/56 Total Score /56 Total Score /56                                                                                                                                  TREATMENT DATE:  12/18/23 Therex Nu step L1 X 10 min UE/LE seat #14  Neuromuscular re-ed Heel and toe raises X 15 reps Walking march in bars 4 round trips with UE support (cues for big steps)  Sit to stand 2 sets of 5, power up and then slow lower, from 23 inch Stepping over objects in bars to encourage longer steps and foot clearance 5 round trips  of 10 feet then moved out of bars and performed with CGA for balance 5 round trips using ankle weights as objects March walking outside of bars 5 round trips of 20 feet with CGA Stepping over and back over foam roll  A-P X 10 bilat with CGA  Lateral stepping over bolster X 10 bilat 50 feet of walking with max cues and effort to take big strides  12/14/23 Therex Nu step L1 X 10 min UE/LE seat #14  Neuromuscular re-ed Heel and toe raises X 15 reps Walking march in bars 4 round trips with UE support (cues for big steps) Standing tandem balance 30 sec X 3 bilat Backward walking in bars 4 round trips  without UE support (cues for bigger steps) Blaze pods SLS 3 pods 30 sec X 3 bilat Sit to stand 2 sets of 5, power up and then slow lower, from 23 inch   Eval HEP creation and review with demonstration and trial set preformed, see below for details Selfcare: education an importance of drinking more water , with goal of 1 gallon per day. The importance of HEP on his overall progress.     PATIENT EDUCATION: Education details: HEP, PT plan of care, selfcare Person educated: Patient Education method: Explanation, Demonstration,  Verbal cues, and Handouts Education comprehension: verbalized understanding, further education recommended   HOME EXERCISE PROGRAM: Access Code: [email or web address] URL: [email or web address] Date: 12/10/2023 Prepared by: Redell Moose  Exercises - Sit to Stand - Increased Speed  - 2 x daily - 6 x weekly - 1 sets - 10 reps - Heel Toe Raises with Counter Support  - 2 x daily - 6 x weekly - 2 sets - 10 reps - Walking March  - 2 x daily - 6 x weekly - 3 sets - 10 reps - Standing Tandem Balance with Counter Support  - 2 x daily - 6 x weekly - 1 sets - 3 reps - 30 sec hold - Backward Walking with Counter Support  - 2 x daily - 6 x weekly - 3 sets - 10 reps  ASSESSMENT:  CLINICAL IMPRESSION: Session today focused on gait and balance with emphasis on bigger step length and more hip/knee flexion to improve foot clearance. Overall did show some carryover to his usual gait from this. He will need continued work and reinforcement.    Per eval: Patient referred to PT for gait training after 2 recent falls. He has slow gait speed and shuffles feet versus picking them up (resembles parkinsons like gait). He does not report any tremors though. He does have history of seizure. His balance testing today do place him at an increased risk of falling. Patient will benefit from skilled PT to improve overall function and to address impairments and limitations listed below.  OBJECTIVE IMPAIRMENTS: decreased activity tolerance for ADL's, difficulty walking, decreased balance, decreased endurance, decreased mobility, decreased ROM, decreased strength, impaired flexibility, and pain.  ACTIVITY LIMITATIONS: bending, lifting, carry, locomotion, PERSONAL FACTORS: see above PMH  also affecting patient's functional outcome.  REHAB POTENTIAL: Good  CLINICAL DECISION MAKING: Evolving/moderate complexity  EVALUATION COMPLEXITY: Moderate    GOALS: Short term PT Goals Target date: 01/07/2024   Pt will be I and  compliant with HEP. Baseline: no HEP Goal status: New Pt will improve 5 times sit to stand test  to less than 23 seconds to show improved leg strength and balance Baseline:28 seconds Goal status: New  Long term PT goals Target date:02/04/2024   Pt will improve Timed up and go (TUG) time to less than 18 seconds and also 5 times sit to stand test to less than 18 seconds to show improved gait speed and balance Baseline:24 sec Goal status: New Pt will improve  strength to at least 4+/5 MMT to improve functional strength Baseline:4/5 Goal status: New Pt will improve Patient specific functional scale (PSFS) to at least 7 /10 to show improved function level Baseline:4/10 Goal status: New Pt will improve 3 minute walk test to >250 feet to improve community access Baseline:190 feet Goal status: New Pt will improve BERG balance test to >45/56 to show improved balance Baseline:37/56 Goal status: New  PLAN: PT FREQUENCY: 1-3 times per week   PT DURATION: 4-6 weeks  PLANNED INTERVENTIONS (unless contraindicated):  97110-Therapeutic exercises, 97530- Therapeutic activity, 97112- Neuromuscular re-education, 97535- Self Care, [id number]- Manual therapy, [id number]- Gait training, and Balance training  PLAN FOR NEXT SESSION:  work on gait, balance, leg strength, big movements.  NEXT MD VISIT: Dec 26, 2024  For all possible CPT codes, reference the Planned Interventions line above.     Check all conditions that are expected to impact treatment: {Conditions expected to impact treatment:Musculoskeletal disorders and Neurological condition and/or seizures   If treatment provided at initial evaluation, no treatment charged due to lack of authorization.       Redell JONELLE Moose, PT,DPT 12/18/2023, 12:05 PM

## 2023-12-22 ENCOUNTER — Ambulatory Visit: Admitting: Physical Therapy

## 2023-12-22 ENCOUNTER — Encounter: Payer: Self-pay | Admitting: Physical Therapy

## 2023-12-22 DIAGNOSIS — R2689 Other abnormalities of gait and mobility: Secondary | ICD-10-CM

## 2023-12-22 DIAGNOSIS — R269 Unspecified abnormalities of gait and mobility: Secondary | ICD-10-CM | POA: Diagnosis not present

## 2023-12-22 DIAGNOSIS — R296 Repeated falls: Secondary | ICD-10-CM

## 2023-12-22 DIAGNOSIS — M6281 Muscle weakness (generalized): Secondary | ICD-10-CM

## 2023-12-22 NOTE — Therapy (Signed)
 OUTPATIENT PHYSICAL THERAPY TREATMENT   Patient Name: William Burns MRN: [id number] DOB:[birth year], 78 y.o., male Today's Date: 12/22/2023  END OF SESSION:  PT End of Session - 12/22/23 1026     Visit Number 4    Number of Visits 12    Date for PT Re-Evaluation 01/21/24    Authorization Type MCD    PT Start Time 1015    PT Stop Time 1100    PT Time Calculation (min) 45 min    Activity Tolerance Patient tolerated treatment well    Behavior During Therapy [facility] for tasks assessed/performed            Past Medical History:  Diagnosis Date   BPH (benign prostatic hyperplasia)    Cancer (HCC)    colon   Cataract    Colonic mass 07/03/2011   Difficult intubation    extra long tube for intub per wife; states his neck is longer than regular people and slow to wake up    Erosive gastritis 07/03/2011   GERD (gastroesophageal reflux disease)    History of kidney stones    Kidney stones 2011   Renal calculus    Tooth infection 07/03/2011   Past Surgical History:  Procedure Laterality Date   CATARACT EXTRACTION W/PHACO Left 08/23/2012   Procedure: CATARACT EXTRACTION PHACO AND INTRAOCULAR LENS PLACEMENT (IOC);  Surgeon: Cherene Mania, MD;  Location: [facility];  Service: Ophthalmology;  Laterality: Left;  CDE 16.64   CATARACT EXTRACTION W/PHACO Right 06/09/2013   Procedure: CATARACT EXTRACTION PHACO AND INTRAOCULAR LENS PLACEMENT (IOC) CDE=10.86;  Surgeon: Cherene Mania, MD;  Location: [facility];  Service: Ophthalmology;  Laterality: Right;   COLON SURGERY     COLONOSCOPY  07/03/2011   Procedure: COLONOSCOPY;  Surgeon: Claudis RAYMOND Rivet, MD;  [facility]: [facility];  Service: Endoscopy;  Laterality: N/A;   COLONOSCOPY N/A 09/16/2012   Procedure: COLONOSCOPY;  Surgeon: Claudis RAYMOND Rivet, MD;  [facility];  Service: Endoscopy;  Laterality: N/A;  930   COLONOSCOPY N/A 12/05/2015   Procedure: COLONOSCOPY;  Surgeon: Claudis RAYMOND Rivet, MD;  [facility];  Service: Endoscopy;   Laterality: N/A;  1200   COLONOSCOPY WITH PROPOFOL  N/A 02/06/2021   Procedure: COLONOSCOPY WITH PROPOFOL ;  Surgeon: Rivet Claudis RAYMOND, MD;  [facility];  Service: Endoscopy;  Laterality: N/A;  12:50   CYSTOSCOPY  07/09/2011   Procedure: CYSTOSCOPY FLEXIBLE;  Surgeon: Emery LILLETTE Blaze, MD;  Location: [facility];  Service: Urology;  Laterality: N/A;   CYSTOSCOPY WITH LITHOLAPAXY  07/11/2011   Procedure: CYSTOSCOPY WITH LITHOLAPAXY;  Surgeon: Emery LILLETTE Blaze, MD;  Location: [facility];  Service: Urology;  Laterality: N/A;   ESOPHAGOGASTRODUODENOSCOPY  07/03/2011   Procedure: ESOPHAGOGASTRODUODENOSCOPY (EGD);  Surgeon: Claudis RAYMOND Rivet, MD;  [facility];  Service: Endoscopy;  Laterality: N/A;   HERNIA REPAIR  2008    umbilical    INGUINAL HERNIA REPAIR Right 05/04/2017   Procedure: HERNIA REPAIR INGUINAL ADULT WITH MESH;  Surgeon: Mavis Anes, MD;  Location: [facility];  Service: General;  Laterality: Right;   PARTIAL COLECTOMY  07/04/2011   Procedure: PARTIAL COLECTOMY;  Surgeon: Anes DELENA Mavis, MD;  Location: [facility];  Service: General;  Laterality: N/A;   POLYPECTOMY  02/06/2021   Procedure: POLYPECTOMY;  Surgeon: Rivet Claudis RAYMOND, MD;  [facility];  Service: Endoscopy;;   Prostage cancer     PROSTATE BIOPSY     ROBOT ASSISTED LAPAROSCOPIC RADICAL PROSTATECTOMY Bilateral 01/19/2015  Procedure: ROBOTIC ASSISTED LAPAROSCOPIC RADICAL PROSTATECTOMY AND BILATERAL PELVIC LYMPH NODE DISSECTION, LAPARASCOPIC  LYSIS OF ADHESIONS;  Surgeon: Morene LELON Salines, MD;  Location: [facility];  Service: Urology;  Laterality: Bilateral;   TRANSURETHRAL RESECTION OF PROSTATE  07/11/2011   Procedure: TRANSURETHRAL RESECTION OF THE PROSTATE (TURP);  Surgeon: Mohammad I Javaid, MD;  Location: [facility];  Service: Urology;  Laterality: N/A;   Patient Active Problem List   Diagnosis Date Noted   Left thyroid  nodule 01/13/2019   Non-recurrent unilateral inguinal hernia without obstruction or gangrene    GERD  (gastroesophageal reflux disease) 01/26/2015   TIA (transient ischemic attack)    Transient global amnesia 01/25/2015   Prostate cancer (HCC) 08/29/2014   Kidney stone on left side 08/02/2013   Diarrhea 07/05/2013   Short-segment Barrett's esophagus 02/09/2012   Calculus, bladder 07/04/2011   Tooth infection 07/03/2011   Adenocarcinoma of colon (HCC) 07/03/2011   Erosive gastritis 07/03/2011   Microcytic hypochromic anemia 07/02/2011   Hx of colonic polyp 07/02/2011   Hyperglycemia 07/02/2011   BPH (benign prostatic hyperplasia) 07/02/2011    PCP: Shona Norleen PEDLAR, MD   REFERRING PROVIDER: Shona Norleen PEDLAR, MD   REFERRING DIAG:  R29.6 (ICD-10-CM) - Multiple falls  R26.9 (ICD-10-CM) - Gait abnormality    Rationale for Evaluation and Treatment:  Rehabiliation  THERAPY DIAG:  Other abnormalities of gait and mobility  Muscle weakness (generalized)  Repeated falls  ONSET DATE: Fall 2 weeks ago from 12/10/23   SUBJECTIVE:                                                                                                                                                                                           SUBJECTIVE STATEMENT: He denies pain, his wife has been working with him to try to be more compliant with taking bigger steps and doing HEP  PERTINENT HISTORY:  See above PMH  PAIN:  Denies pain   PRECAUTIONS: ,  Fall  RED FLAGS: None   WEIGHT BEARING RESTRICTIONS:  No  FALLS:  Has patient fallen in last 6 months? Yes. 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  DIAGNOSTIC FINDINGS:  Head CT 2024 IMPRESSION: No acute intracranial abnormality  PATIENT SURVEYS:  Patient-Specific Activity Scoring Scheme  0 represents "unable to perform." 10 represents "able to  perform at prior level. 0 1 2 3 4 5 6 7 8 9  10 (Date and Score)   Activity  Eval     1. Getting up from chair 4     2. walking 4                 Score 4/10    Total score = sum of the activity scores/number of activities Minimum detectable change (90%CI) for average score = 2 points Minimum detectable change (90%CI) for single activity score = 3 points   GAIT: Eval: Distance walked: 25 feet Assistive device utilized: None, but does use RW at home sometimes Level of assistance: SBA Comments: slow speed shuffling gait.     UPPER EXTREMITY MMT: Grossly 4+/5 MMT bilat     LOWER EXTREMITY MMT:   Grossly 4/5 MMT bilat   FUNCTIONAL TESTS:  Eval: 5 times sit to stand: 28 seconds Timed up and go (TUG): 24.09 3 minute walk test: 190 feet, HR 61, SPO2 96%  Berg Balance Scale:  Item Test date: 12/10/23 Test date:  Test date:   Sitting to standing 4. able to stand without using hands and stabilize independently Insert OPRCBERGREEVAL SmartPhrase at re-test date   2. Standing unsupported 4. able to stand safely for 2 minutes    3. Sitting with back unsupported, feet supported 4. able to sit safely and securely for 2 minutes    4. Standing to sitting 3. controls descent by using hands    5. Pivot transfer  4. able to transfer safely with minor use of hands    6. Standing unsupported with eyes closed 3. able to stand 10 seconds with supervision    7. Standing unsupported with feet together 3. able to place feet together independently and stand 1 minute with supervision    8. Reaching forward with outstretched arms while standing 4. can reach forward confidently 25 cm (10 inches)    9. Pick up object from the floor from standing 3. able to pick up slipper but needs supervision    10. Turning to look behind over left and right shoulders while standing 2. turns sideways but only maintains balance    11. Turn 360 degrees 2. able to turn 360 degrees safely but slowly    12. Place  alternate foot on step or stool while standing unsupported 1. able to complete > 2 steps needs minimal assist    13. Standing unsupported one foot in front 0. loses balance while stepping or standing    14. Standing on one leg 0. unable to try of needs assist to prevent fall      Total Score 37/56 Total Score /56 Total Score /56                                                                                                                                  TREATMENT DATE:  12/22/23 Therex Nu step L1 X 12 min UE/LE seat #14  Neuromuscular re-ed Heel and toe raises X 15 reps Walking march in bars 4 round  trips with UE support (cues for big steps) Backwards march in bars 4 round trips with UE support (cues for big steps) Sidestepping in bars 4 round trips with UE support (cues for big steps) Stepping forward and back over ankle weight X 10 each side with CGA Stepping over objects  out of bars and performed with CGA for balance 10 round trips using ankle weights as objects   Theractivity Leg extension machine DL [id number] Sit to stand 2 sets of 5, power up and then slow lower, from 23 inch 50 feet X 2 of walking with max cues and effort to take big strides  12/18/23 Therex Nu step L1 X 10 min UE/LE seat #14  Neuromuscular re-ed Heel and toe raises X 15 reps Walking march in bars 4 round trips with UE support (cues for big steps) Sit to stand 2 sets of 5, power up and then slow lower, from 23 inch Stepping over objects in bars to encourage longer steps and foot clearance 5 round trips  of 10 feet then moved out of bars and performed with CGA for balance 5 round trips using ankle weights as objects March walking outside of bars 5 round trips of 20 feet with CGA Stepping over and back over foam roll  A-P X 10 bilat with CGA  Lateral stepping over bolster X 10 bilat 50 feet of walking with max cues and effort to take big strides  12/14/23 Therex Nu step L1 X 10 min UE/LE seat  #14  Neuromuscular re-ed Heel and toe raises X 15 reps Walking march in bars 4 round trips with UE support (cues for big steps) Standing tandem balance 30 sec X 3 bilat Backward walking in bars 4 round trips  without UE support (cues for bigger steps) Blaze pods SLS 3 pods 30 sec X 3 bilat Sit to stand 2 sets of 5, power up and then slow lower, from 23 inch   Eval HEP creation and review with demonstration and trial set preformed, see below for details Selfcare: education an importance of drinking more water , with goal of 1 gallon per day. The importance of HEP on his overall progress.     PATIENT EDUCATION: Education details: HEP, PT plan of care, selfcare Person educated: Patient Education method: Explanation, Demonstration, Verbal cues, and Handouts Education comprehension: verbalized understanding, further education recommended   HOME EXERCISE PROGRAM: Access Code: [email or web address] URL: [email or web address] Date: 12/10/2023 Prepared by: Redell Moose  Exercises - Sit to Stand - Increased Speed  - 2 x daily - 6 x weekly - 1 sets - 10 reps - Heel Toe Raises with Counter Support  - 2 x daily - 6 x weekly - 2 sets - 10 reps - Walking March  - 2 x daily - 6 x weekly - 3 sets - 10 reps - Standing Tandem Balance with Counter Support  - 2 x daily - 6 x weekly - 1 sets - 3 reps - 30 sec hold - Backward Walking with Counter Support  - 2 x daily - 6 x weekly - 3 sets - 10 reps  ASSESSMENT:  CLINICAL IMPRESSION: Continued with max effort and encouragement for longer step length to improve gait speed. Added in more leg strength work as well to help with standing tolerance for ADL's.  Per eval: Patient referred to PT for gait training after 2 recent falls. He has slow gait speed and shuffles feet versus picking them up (resembles parkinsons like gait). He does not report  any tremors though. He does have history of seizure. His balance testing today do place him at an increased  risk of falling. Patient will benefit from skilled PT to improve overall function and to address impairments and limitations listed below.  OBJECTIVE IMPAIRMENTS: decreased activity tolerance for ADL's, difficulty walking, decreased balance, decreased endurance, decreased mobility, decreased ROM, decreased strength, impaired flexibility, and pain.  ACTIVITY LIMITATIONS: bending, lifting, carry, locomotion, PERSONAL FACTORS: see above PMH  also affecting patient's functional outcome.  REHAB POTENTIAL: Good  CLINICAL DECISION MAKING: Evolving/moderate complexity  EVALUATION COMPLEXITY: Moderate    GOALS: Short term PT Goals Target date: 01/07/2024   Pt will be I and compliant with HEP. Baseline: no HEP Goal status: New Pt will improve 5 times sit to stand test to less than 23 seconds to show improved leg strength and balance Baseline:28 seconds Goal status: New  Long term PT goals Target date:02/04/2024   Pt will improve Timed up and go (TUG) time to less than 18 seconds and also 5 times sit to stand test to less than 18 seconds to show improved gait speed and balance Baseline:24 sec Goal status: New Pt will improve  strength to at least 4+/5 MMT to improve functional strength Baseline:4/5 Goal status: New Pt will improve Patient specific functional scale (PSFS) to at least 7 /10 to show improved function level Baseline:4/10 Goal status: New Pt will improve 3 minute walk test to >250 feet to improve community access Baseline:190 feet Goal status: New Pt will improve BERG balance test to >45/56 to show improved balance Baseline:37/56 Goal status: New  PLAN: PT FREQUENCY: 1-3 times per week   PT DURATION: 4-6 weeks  PLANNED INTERVENTIONS (unless contraindicated):  97110-Therapeutic exercises, 97530- Therapeutic activity, 97112- Neuromuscular re-education, 97535- Self Care, [id number]- Manual therapy, [id number]- Gait training, and Balance training  PLAN FOR NEXT SESSION:  work on  gait, balance, leg strength, big movements.  NEXT MD VISIT: 12/01/24    Redell JONELLE Moose, PT,DPT 12/22/2023, 11:09 AM

## 2023-12-25 ENCOUNTER — Encounter: Payer: Self-pay | Admitting: Physical Therapy

## 2023-12-25 ENCOUNTER — Ambulatory Visit: Admitting: Physical Therapy

## 2023-12-25 DIAGNOSIS — M6281 Muscle weakness (generalized): Secondary | ICD-10-CM | POA: Diagnosis not present

## 2023-12-25 DIAGNOSIS — R2689 Other abnormalities of gait and mobility: Secondary | ICD-10-CM

## 2023-12-25 DIAGNOSIS — R296 Repeated falls: Secondary | ICD-10-CM | POA: Diagnosis not present

## 2023-12-25 DIAGNOSIS — R269 Unspecified abnormalities of gait and mobility: Secondary | ICD-10-CM | POA: Diagnosis not present

## 2023-12-25 NOTE — Therapy (Signed)
 OUTPATIENT PHYSICAL THERAPY TREATMENT   Patient Name: William Burns MRN: [id number] DOB:11-Jun-1945, 78 y.o., male Today's Date: 12/25/2023  END OF SESSION:  PT End of Session - 12/25/23 1026     Visit Number 5    Number of Visits 12    Date for PT Re-Evaluation 01/21/24    Authorization Type MCD    PT Start Time 1015    PT Stop Time 1055    PT Time Calculation (min) 40 min    Activity Tolerance Patient tolerated treatment well    Behavior During Therapy [facility] for tasks assessed/performed            Past Medical History:  Diagnosis Date   BPH (benign prostatic hyperplasia)    Cancer (HCC)    colon   Cataract    Colonic mass 07/03/2011   Difficult intubation    extra long tube for intub per wife; states his neck is longer than regular people and slow to wake up    Erosive gastritis 07/03/2011   GERD (gastroesophageal reflux disease)    History of kidney stones    Kidney stones 2011   Renal calculus    Tooth infection 07/03/2011   Past Surgical History:  Procedure Laterality Date   CATARACT EXTRACTION W/PHACO Left 08/23/2012   Procedure: CATARACT EXTRACTION PHACO AND INTRAOCULAR LENS PLACEMENT (IOC);  Surgeon: Cherene Mania, MD;  Location: [facility];  Service: Ophthalmology;  Laterality: Left;  CDE 16.64   CATARACT EXTRACTION W/PHACO Right 06/09/2013   Procedure: CATARACT EXTRACTION PHACO AND INTRAOCULAR LENS PLACEMENT (IOC) CDE=10.86;  Surgeon: Cherene Mania, MD;  Location: [facility];  Service: Ophthalmology;  Laterality: Right;   COLON SURGERY     COLONOSCOPY  07/03/2011   Procedure: COLONOSCOPY;  Surgeon: Claudis RAYMOND Rivet, MD;  [facility]: [facility];  Service: Endoscopy;  Laterality: N/A;   COLONOSCOPY N/A 09/16/2012   Procedure: COLONOSCOPY;  Surgeon: Claudis RAYMOND Rivet, MD;  [facility];  Service: Endoscopy;  Laterality: N/A;  930   COLONOSCOPY N/A 12/05/2015   Procedure: COLONOSCOPY;  Surgeon: Claudis RAYMOND Rivet, MD;  [facility];  Service: Endoscopy;   Laterality: N/A;  1200   COLONOSCOPY WITH PROPOFOL  N/A 02/06/2021   Procedure: COLONOSCOPY WITH PROPOFOL ;  Surgeon: Rivet Claudis RAYMOND, MD;  [facility];  Service: Endoscopy;  Laterality: N/A;  12:50   CYSTOSCOPY  07/09/2011   Procedure: CYSTOSCOPY FLEXIBLE;  Surgeon: Emery LILLETTE Blaze, MD;  Location: [facility];  Service: Urology;  Laterality: N/A;   CYSTOSCOPY WITH LITHOLAPAXY  07/11/2011   Procedure: CYSTOSCOPY WITH LITHOLAPAXY;  Surgeon: Emery LILLETTE Blaze, MD;  Location: [facility];  Service: Urology;  Laterality: N/A;   ESOPHAGOGASTRODUODENOSCOPY  07/03/2011   Procedure: ESOPHAGOGASTRODUODENOSCOPY (EGD);  Surgeon: Claudis RAYMOND Rivet, MD;  [facility];  Service: Endoscopy;  Laterality: N/A;   HERNIA REPAIR  2008    umbilical    INGUINAL HERNIA REPAIR Right 05/04/2017   Procedure: HERNIA REPAIR INGUINAL ADULT WITH MESH;  Surgeon: Mavis Anes, MD;  Location: [facility];  Service: General;  Laterality: Right;   PARTIAL COLECTOMY  07/04/2011   Procedure: PARTIAL COLECTOMY;  Surgeon: Anes DELENA Mavis, MD;  Location: [facility];  Service: General;  Laterality: N/A;   POLYPECTOMY  02/06/2021   Procedure: POLYPECTOMY;  Surgeon: Rivet Claudis RAYMOND, MD;  [facility];  Service: Endoscopy;;   Prostage cancer     PROSTATE BIOPSY     ROBOT ASSISTED LAPAROSCOPIC RADICAL PROSTATECTOMY Bilateral 01/19/2015  Procedure: ROBOTIC ASSISTED LAPAROSCOPIC RADICAL PROSTATECTOMY AND BILATERAL PELVIC LYMPH NODE DISSECTION, LAPARASCOPIC  LYSIS OF ADHESIONS;  Surgeon: Morene LELON Salines, MD;  Location: [facility];  Service: Urology;  Laterality: Bilateral;   TRANSURETHRAL RESECTION OF PROSTATE  07/11/2011   Procedure: TRANSURETHRAL RESECTION OF THE PROSTATE (TURP);  Surgeon: Mohammad I Javaid, MD;  Location: [facility];  Service: Urology;  Laterality: N/A;   Patient Active Problem List   Diagnosis Date Noted   Left thyroid  nodule 01/13/2019   Non-recurrent unilateral inguinal hernia without obstruction or gangrene    GERD  (gastroesophageal reflux disease) 01/26/2015   TIA (transient ischemic attack)    Transient global amnesia 01/25/2015   Prostate cancer (HCC) 08/29/2014   Kidney stone on left side 08/02/2013   Diarrhea 07/05/2013   Short-segment Barrett's esophagus 02/09/2012   Calculus, bladder 07/04/2011   Tooth infection 07/03/2011   Adenocarcinoma of colon (HCC) 07/03/2011   Erosive gastritis 07/03/2011   Microcytic hypochromic anemia 07/02/2011   Hx of colonic polyp 07/02/2011   Hyperglycemia 07/02/2011   BPH (benign prostatic hyperplasia) 07/02/2011    PCP: Shona Norleen PEDLAR, MD   REFERRING PROVIDER: Shona Norleen PEDLAR, MD   REFERRING DIAG:  R29.6 (ICD-10-CM) - Multiple falls  R26.9 (ICD-10-CM) - Gait abnormality    Rationale for Evaluation and Treatment:  Rehabiliation  THERAPY DIAG:  Other abnormalities of gait and mobility  Muscle weakness (generalized)  Repeated falls  ONSET DATE: Fall 2 weeks ago from 12/10/23   SUBJECTIVE:                                                                                                                                                                                           SUBJECTIVE STATEMENT: He denies pain, he reports he has been drinking more water .   PERTINENT HISTORY:  See above PMH  PAIN:  Denies pain   PRECAUTIONS: ,  Fall  RED FLAGS: None   WEIGHT BEARING RESTRICTIONS:  No  FALLS:  Has patient fallen in last 6 months? Yes. 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  DIAGNOSTIC FINDINGS:  Head CT 2024 IMPRESSION: No acute intracranial abnormality  PATIENT SURVEYS:  Patient-Specific Activity Scoring Scheme  0 represents "unable to perform." 10 represents "able to perform at prior level. 0 1 2 3 4 5 6 7 8 9  10 (Date and  Score)   Activity Eval     1. Getting up from chair 4  2. walking 4                 Score 4/10    Total score = sum of the activity scores/number of activities Minimum detectable change (90%CI) for average score = 2 points Minimum detectable change (90%CI) for single activity score = 3 points   GAIT: Eval: Distance walked: 25 feet Assistive device utilized: None, but does use RW at home sometimes Level of assistance: SBA Comments: slow speed shuffling gait.     UPPER EXTREMITY MMT: Grossly 4+/5 MMT bilat     LOWER EXTREMITY MMT:   Grossly 4/5 MMT bilat   FUNCTIONAL TESTS:  Eval: 5 times sit to stand: 28 seconds Timed up and go (TUG): 24.09 3 minute walk test: 190 feet, HR 61, SPO2 96%  Berg Balance Scale:  Item Test date: 12/10/23 Test date:  Test date:   Sitting to standing 4. able to stand without using hands and stabilize independently Insert OPRCBERGREEVAL SmartPhrase at re-test date   2. Standing unsupported 4. able to stand safely for 2 minutes    3. Sitting with back unsupported, feet supported 4. able to sit safely and securely for 2 minutes    4. Standing to sitting 3. controls descent by using hands    5. Pivot transfer  4. able to transfer safely with minor use of hands    6. Standing unsupported with eyes closed 3. able to stand 10 seconds with supervision    7. Standing unsupported with feet together 3. able to place feet together independently and stand 1 minute with supervision    8. Reaching forward with outstretched arms while standing 4. can reach forward confidently 25 cm (10 inches)    9. Pick up object from the floor from standing 3. able to pick up slipper but needs supervision    10. Turning to look behind over left and right shoulders while standing 2. turns sideways but only maintains balance    11. Turn 360 degrees 2. able to turn 360 degrees safely but slowly    12. Place alternate foot on step or stool while standing unsupported 1. able  to complete > 2 steps needs minimal assist    13. Standing unsupported one foot in front 0. loses balance while stepping or standing    14. Standing on one leg 0. unable to try of needs assist to prevent fall      Total Score 37/56 Total Score /56 Total Score /56                                                                                                                                  TREATMENT DATE:  12/25/23 Therex Nu step L1 X 12 min UE/LE seat #14  Neuromuscular re-ed Walking march in bars 4 round trips with UE support (cues for big steps) Backwards march in bars 4 round trips with UE support (cues for big steps)  Sidestepping in bars 4 round trips with UE support (cues for big steps) Stepping forward and back over ankle weight X 10 each side with CGA Stepping over objects  out of bars and performed with CGA for balance 10 round trips using ankle weights as objects  Theractivity Heel and toe raises X 20 reps Leg extension machine DL 74# [id number] Sit to stand 2 sets of 6, power up and then slow lower, from 22 inch 50 feet X 2 of walking with max cues and effort to take big strides  12/22/23 Therex Nu step L1 X 12 min UE/LE seat #14  Neuromuscular re-ed Heel and toe raises X 15 reps Walking march in bars 4 round trips with UE support (cues for big steps) Backwards march in bars 4 round trips with UE support (cues for big steps) Sidestepping in bars 4 round trips with UE support (cues for big steps) Stepping forward and back over ankle weight X 10 each side with CGA Stepping over objects  out of bars and performed with CGA for balance 10 round trips using ankle weights as objects   Theractivity Leg extension machine DL 74# [id number] Sit to stand 2 sets of 6, power up and then slow lower, from 22 inch 50 feet X 2 of walking with max cues and effort to take big strides  12/18/23 Therex Nu step L1 X 10 min UE/LE seat #14  Neuromuscular re-ed Heel and toe raises X 15  reps Walking march in bars 4 round trips with UE support (cues for big steps) Sit to stand 2 sets of 5, power up and then slow lower, from 23 inch Stepping over objects in bars to encourage longer steps and foot clearance 5 round trips  of 10 feet then moved out of bars and performed with CGA for balance 5 round trips using ankle weights as objects March walking outside of bars 5 round trips of 20 feet with CGA Stepping over and back over foam roll  A-P X 10 bilat with CGA  Lateral stepping over bolster X 10 bilat 50 feet of walking with max cues and effort to take big strides   PATIENT EDUCATION: Education details: HEP, PT plan of care, selfcare Person educated: Patient Education method: Explanation, Demonstration, Verbal cues, and Handouts Education comprehension: verbalized understanding, further education recommended   HOME EXERCISE PROGRAM: Access Code: [email or web address] URL: [email or web address] Date: 12/10/2023 Prepared by: Redell Moose  Exercises - Sit to Stand - Increased Speed  - 2 x daily - 6 x weekly - 1 sets - 10 reps - Heel Toe Raises with Counter Support  - 2 x daily - 6 x weekly - 2 sets - 10 reps - Walking March  - 2 x daily - 6 x weekly - 3 sets - 10 reps - Standing Tandem Balance with Counter Support  - 2 x daily - 6 x weekly - 1 sets - 3 reps - 30 sec hold - Backward Walking with Counter Support  - 2 x daily - 6 x weekly - 3 sets - 10 reps  ASSESSMENT:  CLINICAL IMPRESSION: He did show improved movement quality today for lifting his legs and with gait.   Per eval: Patient referred to PT for gait training after 2 recent falls. He has slow gait speed and shuffles feet versus picking them up (resembles parkinsons like gait). He does not report any tremors though. He does have history of seizure. His balance testing today do place him at an increased risk  of falling. Patient will benefit from skilled PT to improve overall function and to address impairments  and limitations listed below.  OBJECTIVE IMPAIRMENTS: decreased activity tolerance for ADL's, difficulty walking, decreased balance, decreased endurance, decreased mobility, decreased ROM, decreased strength, impaired flexibility, and pain.  ACTIVITY LIMITATIONS: bending, lifting, carry, locomotion, PERSONAL FACTORS: see above PMH  also affecting patient's functional outcome.  REHAB POTENTIAL: Good  CLINICAL DECISION MAKING: Evolving/moderate complexity  EVALUATION COMPLEXITY: Moderate    GOALS: Short term PT Goals Target date: 01/07/2024   Pt will be I and compliant with HEP. Baseline: no HEP Goal status: New Pt will improve 5 times sit to stand test to less than 23 seconds to show improved leg strength and balance Baseline:28 seconds Goal status: New  Long term PT goals Target date:02/04/2024   Pt will improve Timed up and go (TUG) time to less than 18 seconds and also 5 times sit to stand test to less than 18 seconds to show improved gait speed and balance Baseline:24 sec Goal status: New Pt will improve  strength to at least 4+/5 MMT to improve functional strength Baseline:4/5 Goal status: New Pt will improve Patient specific functional scale (PSFS) to at least 7 /10 to show improved function level Baseline:4/10 Goal status: New Pt will improve 3 minute walk test to >250 feet to improve community access Baseline:190 feet Goal status: New Pt will improve BERG balance test to >45/56 to show improved balance Baseline:37/56 Goal status: New  PLAN: PT FREQUENCY: 1-3 times per week   PT DURATION: 4-6 weeks  PLANNED INTERVENTIONS (unless contraindicated):  97110-Therapeutic exercises, 97530- Therapeutic activity, 97112- Neuromuscular re-education, 97535- Self Care, [id number]- Manual therapy, 97116- Gait training, and Balance training  PLAN FOR NEXT SESSION:  work on gait, balance, leg strength, big movements.  NEXT MD VISIT: 12/01/24    Redell JONELLE Moose, PT,DPT 12/25/2023,  11:06 AM

## 2023-12-28 ENCOUNTER — Ambulatory Visit: Admitting: Physical Therapy

## 2023-12-28 ENCOUNTER — Encounter: Payer: Self-pay | Admitting: Physical Therapy

## 2023-12-28 DIAGNOSIS — R269 Unspecified abnormalities of gait and mobility: Secondary | ICD-10-CM | POA: Diagnosis not present

## 2023-12-28 DIAGNOSIS — M6281 Muscle weakness (generalized): Secondary | ICD-10-CM

## 2023-12-28 DIAGNOSIS — R296 Repeated falls: Secondary | ICD-10-CM

## 2023-12-28 DIAGNOSIS — R2689 Other abnormalities of gait and mobility: Secondary | ICD-10-CM

## 2023-12-28 NOTE — Therapy (Signed)
 OUTPATIENT PHYSICAL THERAPY TREATMENT   Patient Name: William Burns MRN: [id number] DOB:[birth year], 78 y.o., male Today's Date: 12/28/2023  END OF SESSION:  PT End of Session - 12/28/23 1349     Visit Number 6    Number of Visits 12    Date for PT Re-Evaluation 01/21/24    Authorization Type MCR    PT Start Time 1344    PT Stop Time 1430    PT Time Calculation (min) 46 min    Activity Tolerance Patient tolerated treatment well    Behavior During Therapy WFL for tasks assessed/performed            Past Medical History:  Diagnosis Date   BPH (benign prostatic hyperplasia)    Cancer (HCC)    colon   Cataract    Colonic mass 07/03/2011   Difficult intubation    extra long tube for intub per wife; states his neck is longer than regular people and slow to wake up    Erosive gastritis 07/03/2011   GERD (gastroesophageal reflux disease)    History of kidney stones    Kidney stones 2011   Renal calculus    Tooth infection 07/03/2011   Past Surgical History:  Procedure Laterality Date   CATARACT EXTRACTION W/PHACO Left 08/23/2012   Procedure: CATARACT EXTRACTION PHACO AND INTRAOCULAR LENS PLACEMENT (IOC);  Surgeon: Cherene Mania, MD;  Location: [facility];  Service: Ophthalmology;  Laterality: Left;  CDE 16.64   CATARACT EXTRACTION W/PHACO Right 06/09/2013   Procedure: CATARACT EXTRACTION PHACO AND INTRAOCULAR LENS PLACEMENT (IOC) CDE=10.86;  Surgeon: Cherene Mania, MD;  Location: [facility];  Service: Ophthalmology;  Laterality: Right;   COLON SURGERY     COLONOSCOPY  07/03/2011   Procedure: COLONOSCOPY;  Surgeon: Claudis RAYMOND Rivet, MD;  Location: [facility];  Service: Endoscopy;  Laterality: N/A;   COLONOSCOPY N/A 09/16/2012   Procedure: COLONOSCOPY;  Surgeon: Claudis RAYMOND Rivet, MD;  Location: [facility];  Service: Endoscopy;  Laterality: N/A;  930   COLONOSCOPY N/A 12/05/2015   Procedure: COLONOSCOPY;  Surgeon: Claudis RAYMOND Rivet, MD;  [facility]: [facility];  Service: Endoscopy;   Laterality: N/A;  1200   COLONOSCOPY WITH PROPOFOL  N/A 02/06/2021   Procedure: COLONOSCOPY WITH PROPOFOL ;  Surgeon: Rivet Claudis RAYMOND, MD;  [facility]: [facility];  Service: Endoscopy;  Laterality: N/A;  12:50   CYSTOSCOPY  07/09/2011   Procedure: CYSTOSCOPY FLEXIBLE;  Surgeon: Emery LILLETTE Blaze, MD;  Location: [facility];  Service: Urology;  Laterality: N/A;   CYSTOSCOPY WITH LITHOLAPAXY  07/11/2011   Procedure: CYSTOSCOPY WITH LITHOLAPAXY;  Surgeon: Emery LILLETTE Blaze, MD;  Location: [facility];  Service: Urology;  Laterality: N/A;   ESOPHAGOGASTRODUODENOSCOPY  07/03/2011   Procedure: ESOPHAGOGASTRODUODENOSCOPY (EGD);  Surgeon: Claudis RAYMOND Rivet, MD;  [facility]: [facility];  Service: Endoscopy;  Laterality: N/A;   HERNIA REPAIR  2008    umbilical    INGUINAL HERNIA REPAIR Right 05/04/2017   Procedure: HERNIA REPAIR INGUINAL ADULT WITH MESH;  Surgeon: Mavis Anes, MD;  Location: [facility];  Service: General;  Laterality: Right;   PARTIAL COLECTOMY  07/04/2011   Procedure: PARTIAL COLECTOMY;  Surgeon: Anes DELENA Mavis, MD;  Location: [facility];  Service: General;  Laterality: N/A;   POLYPECTOMY  02/06/2021   Procedure: POLYPECTOMY;  Surgeon: Rivet Claudis RAYMOND, MD;  Location: [facility];  Service: Endoscopy;;   Prostage cancer     PROSTATE BIOPSY     ROBOT ASSISTED LAPAROSCOPIC RADICAL PROSTATECTOMY Bilateral 01/19/2015  Procedure: ROBOTIC ASSISTED LAPAROSCOPIC RADICAL PROSTATECTOMY AND BILATERAL PELVIC LYMPH NODE DISSECTION, LAPARASCOPIC  LYSIS OF ADHESIONS;  Surgeon: Morene LELON Salines, MD;  Location: [facility];  Service: Urology;  Laterality: Bilateral;   TRANSURETHRAL RESECTION OF PROSTATE  07/11/2011   Procedure: TRANSURETHRAL RESECTION OF THE PROSTATE (TURP);  Surgeon: Mohammad I Javaid, MD;  Location: [facility];  Service: Urology;  Laterality: N/A;   Patient Active Problem List   Diagnosis Date Noted   Left thyroid  nodule 01/13/2019   Non-recurrent unilateral inguinal hernia without obstruction or gangrene    GERD  (gastroesophageal reflux disease) 01/26/2015   TIA (transient ischemic attack)    Transient global amnesia 01/25/2015   Prostate cancer (HCC) 08/29/2014   Kidney stone on left side 08/02/2013   Diarrhea 07/05/2013   Short-segment Barrett's esophagus 02/09/2012   Calculus, bladder 07/04/2011   Tooth infection 07/03/2011   Adenocarcinoma of colon (HCC) 07/03/2011   Erosive gastritis 07/03/2011   Microcytic hypochromic anemia 07/02/2011   Hx of colonic polyp 07/02/2011   Hyperglycemia 07/02/2011   BPH (benign prostatic hyperplasia) 07/02/2011    PCP: Shona Norleen PEDLAR, MD   REFERRING PROVIDER: Shona Norleen PEDLAR, MD   REFERRING DIAG:  R29.6 (ICD-10-CM) - Multiple falls  R26.9 (ICD-10-CM) - Gait abnormality    Rationale for Evaluation and Treatment:  Rehabiliation  THERAPY DIAG:  Other abnormalities of gait and mobility  Muscle weakness (generalized)  Repeated falls  ONSET DATE: Fall 2 weeks ago from 12/10/23   SUBJECTIVE:                                                                                                                                                                                           SUBJECTIVE STATEMENT: He denies pain, reports trying to take bigger steps  PERTINENT HISTORY:  See above PMH  PAIN:  Denies pain   PRECAUTIONS: ,  Fall  RED FLAGS: None   WEIGHT BEARING RESTRICTIONS:  No  FALLS:  Has patient fallen in last 6 months? Yes. 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  DIAGNOSTIC FINDINGS:  Head CT 2024 IMPRESSION: No acute intracranial abnormality  PATIENT SURVEYS:  Patient-Specific Activity Scoring Scheme  0 represents "unable to perform." 10 represents "able to perform at prior level. 0 1 2 3 4 5 6 7 8 9  10 (Date and  Score)   Activity Eval     1. Getting up from chair 4  2. walking 4                 Score 4/10    Total score = sum of the activity scores/number of activities Minimum detectable change (90%CI) for average score = 2 points Minimum detectable change (90%CI) for single activity score = 3 points   GAIT: Eval: Distance walked: 25 feet Assistive device utilized: None, but does use RW at home sometimes Level of assistance: SBA Comments: slow speed shuffling gait.     UPPER EXTREMITY MMT: Grossly 4+/5 MMT bilat     LOWER EXTREMITY MMT:   Grossly 4/5 MMT bilat   FUNCTIONAL TESTS:  Eval: 5 times sit to stand: 28 seconds Timed up and go (TUG): 24.09 3 minute walk test: 190 feet, HR 61, SPO2 96%  Berg Balance Scale:  Item Test date: 12/10/23 Test date:  Test date:   Sitting to standing 4. able to stand without using hands and stabilize independently Insert OPRCBERGREEVAL SmartPhrase at re-test date   2. Standing unsupported 4. able to stand safely for 2 minutes    3. Sitting with back unsupported, feet supported 4. able to sit safely and securely for 2 minutes    4. Standing to sitting 3. controls descent by using hands    5. Pivot transfer  4. able to transfer safely with minor use of hands    6. Standing unsupported with eyes closed 3. able to stand 10 seconds with supervision    7. Standing unsupported with feet together 3. able to place feet together independently and stand 1 minute with supervision    8. Reaching forward with outstretched arms while standing 4. can reach forward confidently 25 cm (10 inches)    9. Pick up object from the floor from standing 3. able to pick up slipper but needs supervision    10. Turning to look behind over left and right shoulders while standing 2. turns sideways but only maintains balance    11. Turn 360 degrees 2. able to turn 360 degrees safely but slowly    12. Place alternate foot on step or stool while standing unsupported 1. able  to complete > 2 steps needs minimal assist    13. Standing unsupported one foot in front 0. loses balance while stepping or standing    14. Standing on one leg 0. unable to try of needs assist to prevent fall      Total Score 37/56 Total Score /56 Total Score /56     12/28/23 Functional tests 5 times sit to stand: 21:45 seconds Timed up and go (TUG): 17.96 seconds 3 minute walk test: 300 feet, HR 86, SPO2 95%                                                                                                                              TREATMENT DATE:  12/28/23 Therex Nu step L1 X 11 min UE/LE seat #14  Neuromuscular re-ed Walking  march in bars 4 round trips with UE support (cues for big steps) Backwards march in bars 4 round trips with UE support (cues for big steps) Sidestepping in bars 4 round trips with UE support (cues for big steps) Stepping forward and back over round obstacles X 10 each side with CGA-min A Stepping over objects  out of bars and performed with CGA for balance 10 round trips using ankle weights as objects  Theractivity Updated functional tests, see above for details Heel and toe raises X 20 reps Leg extension machine DL 74# [id number] Leg press machine DL 59# [id number]  2/74/74 Therex Nu step L1 X 12 min UE/LE seat #14  Neuromuscular re-ed Walking march in bars 4 round trips with UE support (cues for big steps) Backwards march in bars 4 round trips with UE support (cues for big steps) Sidestepping in bars 4 round trips with UE support (cues for big steps) Stepping forward and back over ankle weight X 10 each side with CGA Stepping over objects  out of bars and performed with CGA for balance 10 round trips using ankle weights as objects  Theractivity Heel and toe raises X 20 reps Leg extension machine DL 74# [id number] Sit to stand 2 sets of 6, power up and then slow lower, from 22 inch 50 feet X 2 of walking with max cues and effort to take big strides    PATIENT  EDUCATION: Education details: HEP, PT plan of care, selfcare Person educated: Patient Education method: Explanation, Demonstration, Verbal cues, and Handouts Education comprehension: verbalized understanding, further education recommended   HOME EXERCISE PROGRAM: Access Code: [email or web address] URL: [email or web address] Date: 12/10/2023 Prepared by: Redell Moose  Exercises - Sit to Stand - Increased Speed  - 2 x daily - 6 x weekly - 1 sets - 10 reps - Heel Toe Raises with Counter Support  - 2 x daily - 6 x weekly - 2 sets - 10 reps - Walking March  - 2 x daily - 6 x weekly - 3 sets - 10 reps - Standing Tandem Balance with Counter Support  - 2 x daily - 6 x weekly - 1 sets - 3 reps - 30 sec hold - Backward Walking with Counter Support  - 2 x daily - 6 x weekly - 3 sets - 10 reps  ASSESSMENT:  CLINICAL IMPRESSION: He showed good improvement with his functional re-testing see above for details. He has now met 3 of his PT goals. Others ongoing at this time. Although his testing improved his performance does still place him at a increased risk of falling and is below normative values. PT will continue to work to improve this as much as possible over his remaining visits.   OBJECTIVE IMPAIRMENTS: decreased activity tolerance for ADL's, difficulty walking, decreased balance, decreased endurance, decreased mobility, decreased ROM, decreased strength, impaired flexibility, and pain.  ACTIVITY LIMITATIONS: bending, lifting, carry, locomotion, PERSONAL FACTORS: see above PMH  also affecting patient's functional outcome.  REHAB POTENTIAL: Good  CLINICAL DECISION MAKING: Evolving/moderate complexity  EVALUATION COMPLEXITY: Moderate    GOALS: Short term PT Goals Target date: 01/07/2024   Pt will be I and compliant with HEP. Baseline: no HEP Goal status: ongoing 12/28/23, some compliance but not full compliance Pt will improve 5 times sit to stand test to less than 23 seconds to show  improved leg strength and balance Baseline:28 seconds Goal status: MET 12/28/23  Long term PT goals Target date:02/04/2024   Pt will improve Timed up and  go (TUG) time to less than 18 seconds and also 5 times sit to stand test to less than 18 seconds to show improved gait speed and balance Baseline:24 sec Goal status: MET 12/28/23 Pt will improve  strength to at least 4+/5 MMT to improve functional strength Baseline:4/5 Goal status: ongoing 12/28/23 Pt will improve Patient specific functional scale (PSFS) to at least 7 /10 to show improved function level Baseline:4/10 Goal status: ongoing 12/28/23 Pt will improve 3 minute walk test to >250 feet to improve community access Baseline:190 feet Goal status: MET 12/28/23 Pt will improve BERG balance test to >45/56 to show improved balance Baseline:37/56 Goal status: ongoing 12/28/23  PLAN: PT FREQUENCY: 1-3 times per week   PT DURATION: 4-6 weeks  PLANNED INTERVENTIONS (unless contraindicated):  97110-Therapeutic exercises, 97530- Therapeutic activity, 97112- Neuromuscular re-education, 97535- Self Care, [id number]- Manual therapy, 97116- Gait training, and Balance training  PLAN FOR NEXT SESSION:  work on gait, balance, leg strength, big movements.  NEXT MD VISIT: 12/01/24    Redell JONELLE Moose, PT,DPT 12/28/2023, 2:33 PM

## 2023-12-30 ENCOUNTER — Ambulatory Visit (HOSPITAL_COMMUNITY)
Admission: RE | Admit: 2023-12-30 | Discharge: 2023-12-30 | Disposition: A | Discharge status: Home / Self Care | Source: Ambulatory Visit

## 2023-12-30 ENCOUNTER — Other Ambulatory Visit (HOSPITAL_COMMUNITY): Payer: Self-pay

## 2023-12-30 DIAGNOSIS — R410 Disorientation, unspecified: Secondary | ICD-10-CM

## 2023-12-30 DIAGNOSIS — R0989 Other specified symptoms and signs involving the circulatory and respiratory systems: Secondary | ICD-10-CM | POA: Diagnosis not present

## 2023-12-30 DIAGNOSIS — R059 Cough, unspecified: Secondary | ICD-10-CM | POA: Diagnosis not present

## 2023-12-30 DIAGNOSIS — I771 Stricture of artery: Secondary | ICD-10-CM | POA: Diagnosis not present

## 2024-01-01 ENCOUNTER — Encounter: Payer: Self-pay | Admitting: Physical Therapy

## 2024-01-01 ENCOUNTER — Ambulatory Visit: Attending: Neurology | Admitting: Physical Therapy

## 2024-01-01 DIAGNOSIS — R296 Repeated falls: Secondary | ICD-10-CM | POA: Diagnosis not present

## 2024-01-01 DIAGNOSIS — R2689 Other abnormalities of gait and mobility: Secondary | ICD-10-CM | POA: Diagnosis not present

## 2024-01-01 DIAGNOSIS — M6281 Muscle weakness (generalized): Secondary | ICD-10-CM | POA: Diagnosis not present

## 2024-01-01 NOTE — Therapy (Signed)
 OUTPATIENT PHYSICAL THERAPY TREATMENT   Patient Name: William Burns MRN: [id number] DOB:01-Apr-1946, 78 y.o., male Today's Date: 01/01/2024  END OF SESSION:  PT End of Session - 01/01/24 1022     Visit Number 7    Number of Visits 12    Date for PT Re-Evaluation 01/21/24    Authorization Type MCR    PT Start Time 1010    PT Stop Time 1100    PT Time Calculation (min) 50 min    Activity Tolerance Patient tolerated treatment well    Behavior During Therapy WFL for tasks assessed/performed            Past Medical History:  Diagnosis Date   BPH (benign prostatic hyperplasia)    Cancer (HCC)    colon   Cataract    Colonic mass 07/03/2011   Difficult intubation    extra long tube for intub per wife; states his neck is longer than regular people and slow to wake up    Erosive gastritis 07/03/2011   GERD (gastroesophageal reflux disease)    History of kidney stones    Kidney stones 2011   Renal calculus    Tooth infection 07/03/2011   Past Surgical History:  Procedure Laterality Date   CATARACT EXTRACTION W/PHACO Left 08/23/2012   Procedure: CATARACT EXTRACTION PHACO AND INTRAOCULAR LENS PLACEMENT (IOC);  Surgeon: Cherene Mania, MD;  Location: [facility];  Service: Ophthalmology;  Laterality: Left;  CDE 16.64   CATARACT EXTRACTION W/PHACO Right 06/09/2013   Procedure: CATARACT EXTRACTION PHACO AND INTRAOCULAR LENS PLACEMENT (IOC) CDE=10.86;  Surgeon: Cherene Mania, MD;  Location: [facility];  Service: Ophthalmology;  Laterality: Right;   COLON SURGERY     COLONOSCOPY  07/03/2011   Procedure: COLONOSCOPY;  Surgeon: Claudis RAYMOND Rivet, MD;  Location: [facility];  Service: Endoscopy;  Laterality: N/A;   COLONOSCOPY N/A 09/16/2012   Procedure: COLONOSCOPY;  Surgeon: Claudis RAYMOND Rivet, MD;  [facility]: [facility];  Service: Endoscopy;  Laterality: N/A;  930   COLONOSCOPY N/A 12/05/2015   Procedure: COLONOSCOPY;  Surgeon: Claudis RAYMOND Rivet, MD;  [facility]: [facility];  Service: Endoscopy;   Laterality: N/A;  1200   COLONOSCOPY WITH PROPOFOL  N/A 02/06/2021   Procedure: COLONOSCOPY WITH PROPOFOL ;  Surgeon: Rivet Claudis RAYMOND, MD;  [facility]: [facility];  Service: Endoscopy;  Laterality: N/A;  12:50   CYSTOSCOPY  07/09/2011   Procedure: CYSTOSCOPY FLEXIBLE;  Surgeon: Emery LILLETTE Blaze, MD;  Location: [facility];  Service: Urology;  Laterality: N/A;   CYSTOSCOPY WITH LITHOLAPAXY  07/11/2011   Procedure: CYSTOSCOPY WITH LITHOLAPAXY;  Surgeon: Emery LILLETTE Blaze, MD;  Location: [facility];  Service: Urology;  Laterality: N/A;   ESOPHAGOGASTRODUODENOSCOPY  07/03/2011   Procedure: ESOPHAGOGASTRODUODENOSCOPY (EGD);  Surgeon: Claudis RAYMOND Rivet, MD;  [facility]: [facility];  Service: Endoscopy;  Laterality: N/A;   HERNIA REPAIR  2008    umbilical    INGUINAL HERNIA REPAIR Right 05/04/2017   Procedure: HERNIA REPAIR INGUINAL ADULT WITH MESH;  Surgeon: Mavis Anes, MD;  Location: [facility];  Service: General;  Laterality: Right;   PARTIAL COLECTOMY  07/04/2011   Procedure: PARTIAL COLECTOMY;  Surgeon: Anes DELENA Mavis, MD;  Location: [facility];  Service: General;  Laterality: N/A;   POLYPECTOMY  02/06/2021   Procedure: POLYPECTOMY;  Surgeon: Rivet Claudis RAYMOND, MD;  Location: [facility];  Service: Endoscopy;;   Prostage cancer     PROSTATE BIOPSY     ROBOT ASSISTED LAPAROSCOPIC RADICAL PROSTATECTOMY Bilateral 01/19/2015  Procedure: ROBOTIC ASSISTED LAPAROSCOPIC RADICAL PROSTATECTOMY AND BILATERAL PELVIC LYMPH NODE DISSECTION, LAPARASCOPIC  LYSIS OF ADHESIONS;  Surgeon: Morene LELON Salines, MD;  Location: [facility];  Service: Urology;  Laterality: Bilateral;   TRANSURETHRAL RESECTION OF PROSTATE  07/11/2011   Procedure: TRANSURETHRAL RESECTION OF THE PROSTATE (TURP);  Surgeon: Mohammad I Javaid, MD;  Location: [facility];  Service: Urology;  Laterality: N/A;   Patient Active Problem List   Diagnosis Date Noted   Left thyroid  nodule 01/13/2019   Non-recurrent unilateral inguinal hernia without obstruction or gangrene    GERD  (gastroesophageal reflux disease) 01/26/2015   TIA (transient ischemic attack)    Transient global amnesia 01/25/2015   Prostate cancer (HCC) 08/29/2014   Kidney stone on left side 08/02/2013   Diarrhea 07/05/2013   Short-segment Barrett's esophagus 02/09/2012   Calculus, bladder 07/04/2011   Tooth infection 07/03/2011   Adenocarcinoma of colon (HCC) 07/03/2011   Erosive gastritis 07/03/2011   Microcytic hypochromic anemia 07/02/2011   Hx of colonic polyp 07/02/2011   Hyperglycemia 07/02/2011   BPH (benign prostatic hyperplasia) 07/02/2011    PCP: Shona Norleen PEDLAR, MD   REFERRING PROVIDER: Shona Norleen PEDLAR, MD   REFERRING DIAG:  R29.6 (ICD-10-CM) - Multiple falls  R26.9 (ICD-10-CM) - Gait abnormality    Rationale for Evaluation and Treatment:  Rehabiliation  THERAPY DIAG:  Other abnormalities of gait and mobility  Muscle weakness (generalized)  Repeated falls  ONSET DATE: Fall 2 weeks ago from 12/10/23   SUBJECTIVE:                                                                                                                                                                                           SUBJECTIVE STATEMENT: He denies pain, his wife reports more overall confusion with him.   PERTINENT HISTORY:  See above PMH  PAIN:  Denies pain   PRECAUTIONS: ,  Fall  RED FLAGS: None   WEIGHT BEARING RESTRICTIONS:  No  FALLS:  Has patient fallen in last 6 months? Yes. 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  DIAGNOSTIC FINDINGS:  Head CT 2024 IMPRESSION: No acute intracranial abnormality  PATIENT SURVEYS:  Patient-Specific Activity Scoring Scheme  0 represents "unable to perform." 10 represents "able to perform at prior level. 0 1 2 3 4 5 6 7 8 9  10 (Date  and Score)   Activity Eval     1. Getting up from chair 4  2. walking 4                 Score 4/10    Total score = sum of the activity scores/number of activities Minimum detectable change (90%CI) for average score = 2 points Minimum detectable change (90%CI) for single activity score = 3 points   GAIT: Eval: Distance walked: 25 feet Assistive device utilized: None, but does use RW at home sometimes Level of assistance: SBA Comments: slow speed shuffling gait.     UPPER EXTREMITY MMT: Grossly 4+/5 MMT bilat     LOWER EXTREMITY MMT:   Grossly 4/5 MMT bilat   FUNCTIONAL TESTS:  Eval: 5 times sit to stand: 28 seconds Timed up and go (TUG): 24.09 3 minute walk test: 190 feet, HR 61, SPO2 96%  Berg Balance Scale:  Item Test date: 12/10/23 Test date:  Test date:   Sitting to standing 4. able to stand without using hands and stabilize independently Insert OPRCBERGREEVAL SmartPhrase at re-test date   2. Standing unsupported 4. able to stand safely for 2 minutes    3. Sitting with back unsupported, feet supported 4. able to sit safely and securely for 2 minutes    4. Standing to sitting 3. controls descent by using hands    5. Pivot transfer  4. able to transfer safely with minor use of hands    6. Standing unsupported with eyes closed 3. able to stand 10 seconds with supervision    7. Standing unsupported with feet together 3. able to place feet together independently and stand 1 minute with supervision    8. Reaching forward with outstretched arms while standing 4. can reach forward confidently 25 cm (10 inches)    9. Pick up object from the floor from standing 3. able to pick up slipper but needs supervision    10. Turning to look behind over left and right shoulders while standing 2. turns sideways but only maintains balance    11. Turn 360 degrees 2. able to turn 360 degrees safely but slowly    12. Place alternate foot on step or stool while standing unsupported 1.  able to complete > 2 steps needs minimal assist    13. Standing unsupported one foot in front 0. loses balance while stepping or standing    14. Standing on one leg 0. unable to try of needs assist to prevent fall      Total Score 37/56 Total Score /56 Total Score /56     12/28/23 Functional tests 5 times sit to stand: 21:45 seconds Timed up and go (TUG): 17.96 seconds 3 minute walk test: 300 feet, HR 86, SPO2 95%                                                                                                                              TREATMENT DATE:  01/01/24 Therex Nu step L1 X 52 min UE/LE seat #14 for endurance  Neuromuscular  re-ed Walking march in bars 4 round trips with UE support (cues for big steps) Backwards march in bars 4 round trips with UE support (cues for big steps) Sidestepping in bars 4 round trips with UE support (cues for big steps)   Theractivity Sit to stand 2 sets of 6, power up and then slow lower, from 22 inch 100 feet X 3 of walking with max cues and effort to take big strides, performed first rep with cane and he appeared to take bigger steps and improve his walking speed some. Then had him perform again and timed him without the cane and it took 1 min 53 seconds. I then had him perform 3rd rep with the cane and timed this but his speed was the same. I do think he was fatigued by this point so I will repeat this test next time at beginning of session to see if the cane helps improve his overall gait speed. Even if it does not I do feel it helps him with his balance and reduces his risk of falling.  12/28/23 Therex Nu step L1 X 11 min UE/LE seat #14  Neuromuscular re-ed Walking march in bars 4 round trips with UE support (cues for big steps) Backwards march in bars 4 round trips with UE support (cues for big steps) Sidestepping in bars 4 round trips with UE support (cues for big steps) Stepping forward and back over round obstacles X 10 each side with  CGA-min A Stepping over objects  out of bars and performed with CGA for balance 10 round trips using ankle weights as objects  Theractivity Updated functional tests, see above for details Heel and toe raises X 20 reps Leg extension machine DL 74# [id number] Leg press machine DL 59# [id number]      PATIENT EDUCATION: Education details: HEP, PT plan of care, selfcare Person educated: Patient Education method: Explanation, Demonstration, Verbal cues, and Handouts Education comprehension: verbalized understanding, further education recommended   HOME EXERCISE PROGRAM: Access Code: [email or web address] URL: [email or web address] Date: 12/10/2023 Prepared by: Redell Moose  Exercises - Sit to Stand - Increased Speed  - 2 x daily - 6 x weekly - 1 sets - 10 reps - Heel Toe Raises with Counter Support  - 2 x daily - 6 x weekly - 2 sets - 10 reps - Walking March  - 2 x daily - 6 x weekly - 3 sets - 10 reps - Standing Tandem Balance with Counter Support  - 2 x daily - 6 x weekly - 1 sets - 3 reps - 30 sec hold - Backward Walking with Counter Support  - 2 x daily - 6 x weekly - 3 sets - 10 reps  ASSESSMENT:  CLINICAL IMPRESSION: Session today focused on gait and I had him walk 100 feet X 3 of with max cues and effort to take big strides, performed first rep with cane and he appeared to take bigger steps and improve his walking speed some. Then had him perform again and timed him without the cane and it took 1 min 53 seconds. I then had him perform 3rd rep with the cane and timed this but his speed was the same. I do think he was fatigued by this point so I will repeat this test next time at beginning of session to see if the cane helps improve his overall gait speed. Even if it does not I do feel it helps him with his balance and reduces his risk of falling. I  did discuss with his wife that PT would recommend parkinsons screen for him due to his shuffling gait pattern and his decreasing cognition.    OBJECTIVE IMPAIRMENTS: decreased activity tolerance for ADL's, difficulty walking, decreased balance, decreased endurance, decreased mobility, decreased ROM, decreased strength, impaired flexibility, and pain.  ACTIVITY LIMITATIONS: bending, lifting, carry, locomotion, PERSONAL FACTORS: see above PMH  also affecting patient's functional outcome.  REHAB POTENTIAL: Good  CLINICAL DECISION MAKING: Evolving/moderate complexity  EVALUATION COMPLEXITY: Moderate    GOALS: Short term PT Goals Target date: 01/07/2024   Pt will be I and compliant with HEP. Baseline: no HEP Goal status: ongoing 12/28/23, some compliance but not full compliance Pt will improve 5 times sit to stand test to less than 23 seconds to show improved leg strength and balance Baseline:28 seconds Goal status: MET 12/28/23  Long term PT goals Target date:02/04/2024   Pt will improve Timed up and go (TUG) time to less than 18 seconds and also 5 times sit to stand test to less than 18 seconds to show improved gait speed and balance Baseline:24 sec Goal status: MET 12/28/23 Pt will improve  strength to at least 4+/5 MMT to improve functional strength Baseline:4/5 Goal status: ongoing 12/28/23 Pt will improve Patient specific functional scale (PSFS) to at least 7 /10 to show improved function level Baseline:4/10 Goal status: ongoing 12/28/23 Pt will improve 3 minute walk test to >250 feet to improve community access Baseline:190 feet Goal status: MET 12/28/23 Pt will improve BERG balance test to >45/56 to show improved balance Baseline:37/56 Goal status: ongoing 12/28/23  PLAN: PT FREQUENCY: 1-3 times per week   PT DURATION: 4-6 weeks  PLANNED INTERVENTIONS (unless contraindicated):  97110-Therapeutic exercises, 97530- Therapeutic activity, 97112- Neuromuscular re-education, 97535- Self Care, [id number]- Manual therapy, 97116- Gait training, and Balance training  PLAN FOR NEXT SESSION: use cane more,  work on gait,  balance, leg strength, big movements.  NEXT MD VISIT: 12/01/24    Redell JONELLE Moose, PT,DPT 01/01/2024, 11:30 AM

## 2024-01-04 ENCOUNTER — Telehealth: Payer: Self-pay | Admitting: Neurology

## 2024-01-04 ENCOUNTER — Ambulatory Visit: Admitting: Physical Therapy

## 2024-01-04 ENCOUNTER — Encounter: Payer: Self-pay | Admitting: Physical Therapy

## 2024-01-04 DIAGNOSIS — R296 Repeated falls: Secondary | ICD-10-CM

## 2024-01-04 DIAGNOSIS — R2689 Other abnormalities of gait and mobility: Secondary | ICD-10-CM | POA: Diagnosis not present

## 2024-01-04 DIAGNOSIS — M6281 Muscle weakness (generalized): Secondary | ICD-10-CM

## 2024-01-04 NOTE — Telephone Encounter (Signed)
 [facility] has called [facility] Parkinson's test for pt, please call wife to discuss.

## 2024-01-04 NOTE — Therapy (Signed)
 OUTPATIENT PHYSICAL THERAPY TREATMENT   Patient Name: William Burns MRN: [id number] DOB:11-20-1945, 78 y.o., male Today's Date: 01/04/2024  END OF SESSION:  PT End of Session - 01/04/24 1308     Visit Number 8    Number of Visits 12    Date for PT Re-Evaluation 01/21/24    Authorization Type MCR    PT Start Time 1300    PT Stop Time 1345    PT Time Calculation (min) 45 min    Activity Tolerance Patient tolerated treatment well    Behavior During Therapy WFL for tasks assessed/performed            Past Medical History:  Diagnosis Date   BPH (benign prostatic hyperplasia)    Cancer (HCC)    colon   Cataract    Colonic mass 07/03/2011   Difficult intubation    extra long tube for intub per wife; states his neck is longer than regular people and slow to wake up    Erosive gastritis 07/03/2011   GERD (gastroesophageal reflux disease)    History of kidney stones    Kidney stones 2011   Renal calculus    Tooth infection 07/03/2011   Past Surgical History:  Procedure Laterality Date   CATARACT EXTRACTION W/PHACO Left 08/23/2012   Procedure: CATARACT EXTRACTION PHACO AND INTRAOCULAR LENS PLACEMENT (IOC);  Surgeon: Cherene Mania, MD;  Location: [facility];  Service: Ophthalmology;  Laterality: Left;  CDE 16.64   CATARACT EXTRACTION W/PHACO Right 06/09/2013   Procedure: CATARACT EXTRACTION PHACO AND INTRAOCULAR LENS PLACEMENT (IOC) CDE=10.86;  Surgeon: Cherene Mania, MD;  Location: [facility];  Service: Ophthalmology;  Laterality: Right;   COLON SURGERY     COLONOSCOPY  07/03/2011   Procedure: COLONOSCOPY;  Surgeon: Claudis RAYMOND Rivet, MD;  Location: [facility];  Service: Endoscopy;  Laterality: N/A;   COLONOSCOPY N/A 09/16/2012   Procedure: COLONOSCOPY;  Surgeon: Claudis RAYMOND Rivet, MD;  [facility]: [facility];  Service: Endoscopy;  Laterality: N/A;  930   COLONOSCOPY N/A 12/05/2015   Procedure: COLONOSCOPY;  Surgeon: Claudis RAYMOND Rivet, MD;  [facility]: [facility];  Service: Endoscopy;   Laterality: N/A;  1200   COLONOSCOPY WITH PROPOFOL  N/A 02/06/2021   Procedure: COLONOSCOPY WITH PROPOFOL ;  Surgeon: Rivet Claudis RAYMOND, MD;  [facility]: [facility];  Service: Endoscopy;  Laterality: N/A;  12:50   CYSTOSCOPY  07/09/2011   Procedure: CYSTOSCOPY FLEXIBLE;  Surgeon: Emery LILLETTE Blaze, MD;  Location: [facility];  Service: Urology;  Laterality: N/A;   CYSTOSCOPY WITH LITHOLAPAXY  07/11/2011   Procedure: CYSTOSCOPY WITH LITHOLAPAXY;  Surgeon: Emery LILLETTE Blaze, MD;  Location: [facility];  Service: Urology;  Laterality: N/A;   ESOPHAGOGASTRODUODENOSCOPY  07/03/2011   Procedure: ESOPHAGOGASTRODUODENOSCOPY (EGD);  Surgeon: Claudis RAYMOND Rivet, MD;  [facility]: [facility];  Service: Endoscopy;  Laterality: N/A;   HERNIA REPAIR  2008    umbilical    INGUINAL HERNIA REPAIR Right 05/04/2017   Procedure: HERNIA REPAIR INGUINAL ADULT WITH MESH;  Surgeon: Mavis Anes, MD;  Location: [facility];  Service: General;  Laterality: Right;   PARTIAL COLECTOMY  07/04/2011   Procedure: PARTIAL COLECTOMY;  Surgeon: Anes DELENA Mavis, MD;  Location: [facility];  Service: General;  Laterality: N/A;   POLYPECTOMY  02/06/2021   Procedure: POLYPECTOMY;  Surgeon: Rivet Claudis RAYMOND, MD;  Location: [facility];  Service: Endoscopy;;   Prostage cancer     PROSTATE BIOPSY     ROBOT ASSISTED LAPAROSCOPIC RADICAL PROSTATECTOMY Bilateral 01/19/2015  Procedure: ROBOTIC ASSISTED LAPAROSCOPIC RADICAL PROSTATECTOMY AND BILATERAL PELVIC LYMPH NODE DISSECTION, LAPARASCOPIC  LYSIS OF ADHESIONS;  Surgeon: Morene LELON Salines, MD;  Location: [facility];  Service: Urology;  Laterality: Bilateral;   TRANSURETHRAL RESECTION OF PROSTATE  07/11/2011   Procedure: TRANSURETHRAL RESECTION OF THE PROSTATE (TURP);  Surgeon: Mohammad I Javaid, MD;  Location: [facility];  Service: Urology;  Laterality: N/A;   Patient Active Problem List   Diagnosis Date Noted   Left thyroid  nodule 01/13/2019   Non-recurrent unilateral inguinal hernia without obstruction or gangrene    GERD  (gastroesophageal reflux disease) 01/26/2015   TIA (transient ischemic attack)    Transient global amnesia 01/25/2015   Prostate cancer (HCC) 08/29/2014   Kidney stone on left side 08/02/2013   Diarrhea 07/05/2013   Short-segment Barrett's esophagus 02/09/2012   Calculus, bladder 07/04/2011   Tooth infection 07/03/2011   Adenocarcinoma of colon (HCC) 07/03/2011   Erosive gastritis 07/03/2011   Microcytic hypochromic anemia 07/02/2011   Hx of colonic polyp 07/02/2011   Hyperglycemia 07/02/2011   BPH (benign prostatic hyperplasia) 07/02/2011    PCP: Shona Norleen PEDLAR, MD   REFERRING PROVIDER: Shona Norleen PEDLAR, MD   REFERRING DIAG:  R29.6 (ICD-10-CM) - Multiple falls  R26.9 (ICD-10-CM) - Gait abnormality    Rationale for Evaluation and Treatment:  Rehabiliation  THERAPY DIAG:  Other abnormalities of gait and mobility  Muscle weakness (generalized)  Repeated falls  ONSET DATE: Fall 2 weeks ago from 12/10/23   SUBJECTIVE:                                                                                                                                                                                           SUBJECTIVE STATEMENT: He reports some neck pain since Friday. This kept him bed most of the weekend.  PERTINENT HISTORY:  See above PMH  PAIN:  Denies pain   PRECAUTIONS: ,  Fall  RED FLAGS: None   WEIGHT BEARING RESTRICTIONS:  No  FALLS:  Has patient fallen in last 6 months? Yes. 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  DIAGNOSTIC FINDINGS:  Head CT 2024 IMPRESSION: No acute intracranial abnormality  PATIENT SURVEYS:  Patient-Specific Activity Scoring Scheme  0 represents "unable to perform." 10 represents "able to perform at prior level. 0 1 2 3 4 5 6 7 8  9 10  (Date and Score)   Activity Eval     1. Getting up  from chair 4     2. walking 4                 Score 4/10    Total score = sum of the activity scores/number of activities Minimum detectable change (90%CI) for average score = 2 points Minimum detectable change (90%CI) for single activity score = 3 points   GAIT: Eval: Distance walked: 25 feet Assistive device utilized: None, but does use RW at home sometimes Level of assistance: SBA Comments: slow speed shuffling gait.     UPPER EXTREMITY MMT: Grossly 4+/5 MMT bilat     LOWER EXTREMITY MMT:   Grossly 4/5 MMT bilat   FUNCTIONAL TESTS:  Eval: 5 times sit to stand: 28 seconds Timed up and go (TUG): 24.09 3 minute walk test: 190 feet, HR 61, SPO2 96%  Berg Balance Scale:  Item Test date: 12/10/23 Test date:  Test date:   Sitting to standing 4. able to stand without using hands and stabilize independently Insert OPRCBERGREEVAL SmartPhrase at re-test date   2. Standing unsupported 4. able to stand safely for 2 minutes    3. Sitting with back unsupported, feet supported 4. able to sit safely and securely for 2 minutes    4. Standing to sitting 3. controls descent by using hands    5. Pivot transfer  4. able to transfer safely with minor use of hands    6. Standing unsupported with eyes closed 3. able to stand 10 seconds with supervision    7. Standing unsupported with feet together 3. able to place feet together independently and stand 1 minute with supervision    8. Reaching forward with outstretched arms while standing 4. can reach forward confidently 25 cm (10 inches)    9. Pick up object from the floor from standing 3. able to pick up slipper but needs supervision    10. Turning to look behind over left and right shoulders while standing 2. turns sideways but only maintains balance    11. Turn 360 degrees 2. able to turn 360 degrees safely but slowly    12. Place alternate foot on step or stool while standing  unsupported 1. able to complete > 2 steps needs minimal assist    13. Standing unsupported one foot in front 0. loses balance while stepping or standing    14. Standing on one leg 0. unable to try of needs assist to prevent fall      Total Score 37/56 Total Score /56 Total Score /56     12/28/23 Functional tests 5 times sit to stand: 21:45 seconds Timed up and go (TUG): 17.96 seconds 3 minute walk test: 300 feet, HR 86, SPO2 95%                                                                                                                              TREATMENT DATE:  01/04/24 Therex Nu step L1 X 10 min  UE/LE seat #15 for endurance Neck rotation with AAROM 5 sec X 10 Neck extension 5 sec X 10 with strap SNAG Heat on his neck while reviewing HEP, did not include time on heat in treatment time  Neuromuscular re-ed Walking march over obstacles with SPC,  8 round trips   Theractivity Leg press 40# 2X15 Leg extension machine 25# 2X15 100 feet X 2 of walking with max cues and effort to take big strides, performed first rep with cane and it took 1:35 seconds. I then had him rest and drink water   then had him perform again and timed him without the cane and it took 1:09 seconds so he did not walk any faster using it today however even if it does not I do feel it helps him with his balance and reduces his risk of falling.  01/01/24 Therex Nu step L1 X 52 min UE/LE seat #14 for endurance  Neuromuscular re-ed Walking march in bars 4 round trips with UE support (cues for big steps) Backwards march in bars 4 round trips with UE support (cues for big steps) Sidestepping in bars 4 round trips with UE support (cues for big steps)   Theractivity Sit to stand 2 sets of 6, power up and then slow lower, from 22 inch 100 feet X 3 of walking with max cues and effort to take big strides, performed first rep with cane and he appeared to take bigger steps and improve his walking speed some. Then had him  perform again and timed him without the cane and it took 1 min 53 seconds. I then had him perform 3rd rep with the cane and timed this but his speed was the same. I do think he was fatigued by this point so I will repeat this test next time at beginning of session to see if the cane helps improve his overall gait speed. Even if it does not I do feel it helps him with his balance and reduces his risk of falling.    PATIENT EDUCATION: Education details: HEP, PT plan of care, selfcare Person educated: Patient Education method: Explanation, Demonstration, Verbal cues, and Handouts Education comprehension: verbalized understanding, further education recommended   HOME EXERCISE PROGRAM: Access Code: [email or web address] URL: [email or web address] Date: 12/10/2023 Prepared by: Redell Moose  Exercises - Sit to Stand - Increased Speed  - 2 x daily - 6 x weekly - 1 sets - 10 reps - Heel Toe Raises with Counter Support  - 2 x daily - 6 x weekly - 2 sets - 10 reps - Walking March  - 2 x daily - 6 x weekly - 3 sets - 10 reps - Standing Tandem Balance with Counter Support  - 2 x daily - 6 x weekly - 1 sets - 3 reps - 30 sec hold - Backward Walking with Counter Support  - 2 x daily - 6 x weekly - 3 sets - 10 reps  ASSESSMENT:  CLINICAL IMPRESSION: Session today on gait with cane now that he has one to help him with balance and hopefully this will help his step length and gait speed which we are continuing to work hard to improve with PT.  OBJECTIVE IMPAIRMENTS: decreased activity tolerance for ADL's, difficulty walking, decreased balance, decreased endurance, decreased mobility, decreased ROM, decreased strength, impaired flexibility, and pain.  ACTIVITY LIMITATIONS: bending, lifting, carry, locomotion, PERSONAL FACTORS: see above PMH  also affecting patient's functional outcome.  REHAB POTENTIAL: Good  CLINICAL DECISION MAKING: Evolving/moderate complexity  EVALUATION COMPLEXITY:  Moderate    GOALS: Short term PT Goals Target date: 01/07/2024   Pt will be I and compliant with HEP. Baseline: no HEP Goal status: ongoing 12/28/23, some compliance but not full compliance Pt will improve 5 times sit to stand test to less than 23 seconds to show improved leg strength and balance Baseline:28 seconds Goal status: MET 12/28/23  Long term PT goals Target date:02/04/2024   Pt will improve Timed up and go (TUG) time to less than 18 seconds and also 5 times sit to stand test to less than 18 seconds to show improved gait speed and balance Baseline:24 sec Goal status: MET 12/28/23 Pt will improve  strength to at least 4+/5 MMT to improve functional strength Baseline:4/5 Goal status: ongoing 12/28/23 Pt will improve Patient specific functional scale (PSFS) to at least 7 /10 to show improved function level Baseline:4/10 Goal status: ongoing 12/28/23 Pt will improve 3 minute walk test to >250 feet to improve community access Baseline:190 feet Goal status: MET 12/28/23 Pt will improve BERG balance test to >45/56 to show improved balance Baseline:37/56 Goal status: ongoing 12/28/23  PLAN: PT FREQUENCY: 1-3 times per week   PT DURATION: 4-6 weeks  PLANNED INTERVENTIONS (unless contraindicated):  97110-Therapeutic exercises, 97530- Therapeutic activity, 97112- Neuromuscular re-education, 97535- Self Care, [id number]- Manual therapy, [id number]- Gait training, and Balance training  PLAN FOR NEXT SESSION: use cane more,  work on gait, balance, leg strength, big movements.  NEXT MD VISIT: 12/01/24    Redell JONELLE Moose, PT,DPT 01/04/2024, 1:57 PM

## 2024-01-08 ENCOUNTER — Ambulatory Visit: Admitting: Physical Therapy

## 2024-01-08 ENCOUNTER — Encounter: Payer: Self-pay | Admitting: Physical Therapy

## 2024-01-08 DIAGNOSIS — R296 Repeated falls: Secondary | ICD-10-CM | POA: Diagnosis not present

## 2024-01-08 DIAGNOSIS — R2689 Other abnormalities of gait and mobility: Secondary | ICD-10-CM | POA: Diagnosis not present

## 2024-01-08 DIAGNOSIS — M6281 Muscle weakness (generalized): Secondary | ICD-10-CM | POA: Diagnosis not present

## 2024-01-08 NOTE — Therapy (Signed)
 OUTPATIENT PHYSICAL THERAPY TREATMENT   Patient Name: William Burns MRN: [id number] DOB:06-07-1945, 78 y.o., male Today's Date: 01/08/2024  END OF SESSION:  PT End of Session - 01/08/24 1118     Visit Number 9    Number of Visits 12    Date for PT Re-Evaluation 01/21/24    Authorization Type MCR    PT Start Time 1015    PT Stop Time 1100    PT Time Calculation (min) 45 min    Activity Tolerance Patient tolerated treatment well    Behavior During Therapy WFL for tasks assessed/performed            Past Medical History:  Diagnosis Date   BPH (benign prostatic hyperplasia)    Cancer (HCC)    colon   Cataract    Colonic mass 07/03/2011   Difficult intubation    extra long tube for intub per wife; states his neck is longer than regular people and slow to wake up    Erosive gastritis 07/03/2011   GERD (gastroesophageal reflux disease)    History of kidney stones    Kidney stones 2011   Renal calculus    Tooth infection 07/03/2011   Past Surgical History:  Procedure Laterality Date   CATARACT EXTRACTION W/PHACO Left 08/23/2012   Procedure: CATARACT EXTRACTION PHACO AND INTRAOCULAR LENS PLACEMENT (IOC);  Surgeon: Cherene Mania, MD;  Location: [facility];  Service: Ophthalmology;  Laterality: Left;  CDE 16.64   CATARACT EXTRACTION W/PHACO Right 06/09/2013   Procedure: CATARACT EXTRACTION PHACO AND INTRAOCULAR LENS PLACEMENT (IOC) CDE=10.86;  Surgeon: Cherene Mania, MD;  Location: [facility];  Service: Ophthalmology;  Laterality: Right;   COLON SURGERY     COLONOSCOPY  07/03/2011   Procedure: COLONOSCOPY;  Surgeon: Claudis RAYMOND Rivet, MD;  Location: [facility];  Service: Endoscopy;  Laterality: N/A;   COLONOSCOPY N/A 09/16/2012   Procedure: COLONOSCOPY;  Surgeon: Claudis RAYMOND Rivet, MD;  Location: [facility];  Service: Endoscopy;  Laterality: N/A;  930   COLONOSCOPY N/A 12/05/2015   Procedure: COLONOSCOPY;  Surgeon: Claudis RAYMOND Rivet, MD;  Location: [facility];  Service: Endoscopy;   Laterality: N/A;  1200   COLONOSCOPY WITH PROPOFOL  N/A 02/06/2021   Procedure: COLONOSCOPY WITH PROPOFOL ;  Surgeon: Rivet Claudis RAYMOND, MD;  Location: [facility];  Service: Endoscopy;  Laterality: N/A;  12:50   CYSTOSCOPY  07/09/2011   Procedure: CYSTOSCOPY FLEXIBLE;  Surgeon: Emery LILLETTE Blaze, MD;  Location: [facility];  Service: Urology;  Laterality: N/A;   CYSTOSCOPY WITH LITHOLAPAXY  07/11/2011   Procedure: CYSTOSCOPY WITH LITHOLAPAXY;  Surgeon: Emery LILLETTE Blaze, MD;  Location: [facility];  Service: Urology;  Laterality: N/A;   ESOPHAGOGASTRODUODENOSCOPY  07/03/2011   Procedure: ESOPHAGOGASTRODUODENOSCOPY (EGD);  Surgeon: Claudis RAYMOND Rivet, MD;  Location: [facility];  Service: Endoscopy;  Laterality: N/A;   HERNIA REPAIR  2008    umbilical    INGUINAL HERNIA REPAIR Right 05/04/2017   Procedure: HERNIA REPAIR INGUINAL ADULT WITH MESH;  Surgeon: Mavis Anes, MD;  Location: [facility];  Service: General;  Laterality: Right;   PARTIAL COLECTOMY  07/04/2011   Procedure: PARTIAL COLECTOMY;  Surgeon: Anes DELENA Mavis, MD;  Location: [facility];  Service: General;  Laterality: N/A;   POLYPECTOMY  02/06/2021   Procedure: POLYPECTOMY;  Surgeon: Rivet Claudis RAYMOND, MD;  Location: [facility];  Service: Endoscopy;;   Prostage cancer     PROSTATE BIOPSY     ROBOT ASSISTED LAPAROSCOPIC RADICAL PROSTATECTOMY Bilateral 01/19/2015  Procedure: ROBOTIC ASSISTED LAPAROSCOPIC RADICAL PROSTATECTOMY AND BILATERAL PELVIC LYMPH NODE DISSECTION, LAPARASCOPIC  LYSIS OF ADHESIONS;  Surgeon: Morene LELON Salines, MD;  Location: [facility];  Service: Urology;  Laterality: Bilateral;   TRANSURETHRAL RESECTION OF PROSTATE  07/11/2011   Procedure: TRANSURETHRAL RESECTION OF THE PROSTATE (TURP);  Surgeon: Mohammad I Javaid, MD;  Location: [facility];  Service: Urology;  Laterality: N/A;   Patient Active Problem List   Diagnosis Date Noted   Left thyroid  nodule 01/13/2019   Non-recurrent unilateral inguinal hernia without obstruction or gangrene    GERD  (gastroesophageal reflux disease) 01/26/2015   TIA (transient ischemic attack)    Transient global amnesia 01/25/2015   Prostate cancer (HCC) 08/29/2014   Kidney stone on left side 08/02/2013   Diarrhea 07/05/2013   Short-segment Barrett's esophagus 02/09/2012   Calculus, bladder 07/04/2011   Tooth infection 07/03/2011   Adenocarcinoma of colon (HCC) 07/03/2011   Erosive gastritis 07/03/2011   Microcytic hypochromic anemia 07/02/2011   Hx of colonic polyp 07/02/2011   Hyperglycemia 07/02/2011   BPH (benign prostatic hyperplasia) 07/02/2011    PCP: Shona Norleen PEDLAR, MD   REFERRING PROVIDER: Shona Norleen PEDLAR, MD   REFERRING DIAG:  R29.6 (ICD-10-CM) - Multiple falls  R26.9 (ICD-10-CM) - Gait abnormality    Rationale for Evaluation and Treatment:  Rehabiliation  THERAPY DIAG:  Other abnormalities of gait and mobility  Muscle weakness (generalized)  Repeated falls  ONSET DATE: Fall 2 weeks ago from 12/10/23   SUBJECTIVE:                                                                                                                                                                                           SUBJECTIVE STATEMENT: He denies any pain today  PERTINENT HISTORY:  See above PMH  PAIN:  Denies pain   PRECAUTIONS: ,  Fall  RED FLAGS: None   WEIGHT BEARING RESTRICTIONS:  No  FALLS:  Has patient fallen in last 6 months? Yes. 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  DIAGNOSTIC FINDINGS:  Head CT 2024 IMPRESSION: No acute intracranial abnormality  PATIENT SURVEYS:  Patient-Specific Activity Scoring Scheme  0 represents "unable to perform." 10 represents "able to perform at prior level. 0 1 2 3 4 5 6 7 8 9  10 (Date and Score)   Activity Eval     1.  Getting up from chair 4     2. walking 4  Score 4/10    Total score = sum of the activity scores/number of activities Minimum detectable change (90%CI) for average score = 2 points Minimum detectable change (90%CI) for single activity score = 3 points   GAIT: Eval: Distance walked: 25 feet Assistive device utilized: None, but does use RW at home sometimes Level of assistance: SBA Comments: slow speed shuffling gait.     UPPER EXTREMITY MMT: Grossly 4+/5 MMT bilat     LOWER EXTREMITY MMT:   Grossly 4/5 MMT bilat   FUNCTIONAL TESTS:  Eval: 5 times sit to stand: 28 seconds Timed up and go (TUG): 24.09 3 minute walk test: 190 feet, HR 61, SPO2 96%  Berg Balance Scale:  Item Test date: 12/10/23 Test date:  Test date:   Sitting to standing 4. able to stand without using hands and stabilize independently Insert OPRCBERGREEVAL SmartPhrase at re-test date   2. Standing unsupported 4. able to stand safely for 2 minutes    3. Sitting with back unsupported, feet supported 4. able to sit safely and securely for 2 minutes    4. Standing to sitting 3. controls descent by using hands    5. Pivot transfer  4. able to transfer safely with minor use of hands    6. Standing unsupported with eyes closed 3. able to stand 10 seconds with supervision    7. Standing unsupported with feet together 3. able to place feet together independently and stand 1 minute with supervision    8. Reaching forward with outstretched arms while standing 4. can reach forward confidently 25 cm (10 inches)    9. Pick up object from the floor from standing 3. able to pick up slipper but needs supervision    10. Turning to look behind over left and right shoulders while standing 2. turns sideways but only maintains balance    11. Turn 360 degrees 2. able to turn 360 degrees safely but slowly    12. Place alternate foot on step or stool while standing unsupported 1. able to complete > 2 steps needs  minimal assist    13. Standing unsupported one foot in front 0. loses balance while stepping or standing    14. Standing on one leg 0. unable to try of needs assist to prevent fall      Total Score 37/56 Total Score /56 Total Score /56     12/28/23 Functional tests 5 times sit to stand: 21:45 seconds Timed up and go (TUG): 17.96 seconds 3 minute walk test: 300 feet, HR 86, SPO2 95%                                                                                                                              TREATMENT DATE:  01/08/24 Therex Nu step L1 X 15 min UE/LE seat #15 for endurance  Neuromuscular re-ed Walking march over obstacles  8 round trips  Stepping over and back across round bolster X 5  reps bilat March walking 10 feet X 6 Backward walking 10 feet X 6 Sidestepping 10 feet X 6  Theractivity Leg press 40# 2X15 Leg extension machine 25# 2X15 Walking throughout clinic with cues for bigger steps  01/04/24 Therex Nu step L1 X 10 min UE/LE seat #15 for endurance Neck rotation with AAROM 5 sec X 10 Neck extension 5 sec X 10 with strap SNAG Heat on his neck while reviewing HEP, did not include time on heat in treatment time  Neuromuscular re-ed Walking march over obstacles with SPC,  8 round trips   Theractivity Leg press 40# 2X15 Leg extension machine 25# 2X15 100 feet X 2 of walking with max cues and effort to take big strides, performed first rep with cane and it took 1:35 seconds. I then had him rest and drink water   then had him perform again and timed him without the cane and it took 1:09 seconds so he did not walk any faster using it today however even if it does not I do feel it helps him with his balance and reduces his risk of falling.    PATIENT EDUCATION: Education details: HEP, PT plan of care, selfcare Person educated: Patient Education method: Explanation, Demonstration, Verbal cues, and Handouts Education comprehension: verbalized understanding, further  education recommended   HOME EXERCISE PROGRAM: Access Code: [email or web address] URL: [email or web address] Date: 12/10/2023 Prepared by: Redell Moose  Exercises - Sit to Stand - Increased Speed  - 2 x daily - 6 x weekly - 1 sets - 10 reps - Heel Toe Raises with Counter Support  - 2 x daily - 6 x weekly - 2 sets - 10 reps - Walking March  - 2 x daily - 6 x weekly - 3 sets - 10 reps - Standing Tandem Balance with Counter Support  - 2 x daily - 6 x weekly - 1 sets - 3 reps - 30 sec hold - Backward Walking with Counter Support  - 2 x daily - 6 x weekly - 3 sets - 10 reps  ASSESSMENT:  CLINICAL IMPRESSION: PT continuing to work to improve his step length to improve his gait speed. He is inconsistent with this, can perform this better at times but easily reverts back to small shuffling gait.   OBJECTIVE IMPAIRMENTS: decreased activity tolerance for ADL's, difficulty walking, decreased balance, decreased endurance, decreased mobility, decreased ROM, decreased strength, impaired flexibility, and pain.  ACTIVITY LIMITATIONS: bending, lifting, carry, locomotion, PERSONAL FACTORS: see above PMH  also affecting patient's functional outcome.  REHAB POTENTIAL: Good  CLINICAL DECISION MAKING: Evolving/moderate complexity  EVALUATION COMPLEXITY: Moderate    GOALS: Short term PT Goals Target date: 01/07/2024   Pt will be I and compliant with HEP. Baseline: no HEP Goal status: ongoing 12/28/23, some compliance but not full compliance Pt will improve 5 times sit to stand test to less than 23 seconds to show improved leg strength and balance Baseline:28 seconds Goal status: MET 12/28/23  Long term PT goals Target date:02/04/2024   Pt will improve Timed up and go (TUG) time to less than 18 seconds and also 5 times sit to stand test to less than 18 seconds to show improved gait speed and balance Baseline:24 sec Goal status: MET 12/28/23 Pt will improve  strength to at least 4+/5 MMT to  improve functional strength Baseline:4/5 Goal status: ongoing 12/28/23 Pt will improve Patient specific functional scale (PSFS) to at least 7 /10 to show improved function level Baseline:4/10 Goal status: ongoing 12/28/23 Pt will  improve 3 minute walk test to >250 feet to improve community access Baseline:190 feet Goal status: MET 12/28/23 Pt will improve BERG balance test to >45/56 to show improved balance Baseline:37/56 Goal status: ongoing 12/28/23  PLAN: PT FREQUENCY: 1-3 times per week   PT DURATION: 4-6 weeks  PLANNED INTERVENTIONS (unless contraindicated):  97110-Therapeutic exercises, 97530- Therapeutic activity, 97112- Neuromuscular re-education, 97535- Self Care, [id number]- Manual therapy, 97116- Gait training, and Balance training  PLAN FOR NEXT SESSION: use cane more,  work on gait, balance, leg strength, big movements.  NEXT MD VISIT: 12/01/24    Redell JONELLE Moose, PT,DPT 01/08/2024, 11:18 AM

## 2024-01-14 ENCOUNTER — Encounter: Payer: Self-pay | Admitting: Physical Therapy

## 2024-01-14 ENCOUNTER — Ambulatory Visit: Admitting: Physical Therapy

## 2024-01-14 DIAGNOSIS — M6281 Muscle weakness (generalized): Secondary | ICD-10-CM

## 2024-01-14 DIAGNOSIS — R2689 Other abnormalities of gait and mobility: Secondary | ICD-10-CM | POA: Diagnosis not present

## 2024-01-14 DIAGNOSIS — R296 Repeated falls: Secondary | ICD-10-CM | POA: Diagnosis not present

## 2024-01-14 NOTE — Therapy (Signed)
 OUTPATIENT PHYSICAL THERAPY TREATMENT/PROGRESS NOTE Progress Note reporting period 12/10/23 to 01/08/24  See below for objective and subjective measurements relating to patients progress with PT.    Patient Name: William Burns MRN: [id number] DOB:[birth year], 78 y.o., male Today's Date: 01/14/2024  END OF SESSION:  PT End of Session - 01/14/24 0947     Visit Number 10    Number of Visits 12    Date for PT Re-Evaluation 01/21/24    Authorization Type MCR    PT Start Time 0939    PT Stop Time 1017    PT Time Calculation (min) 38 min    Activity Tolerance Patient tolerated treatment well    Behavior During Therapy [facility] for tasks assessed/performed            Past Medical History:  Diagnosis Date   BPH (benign prostatic hyperplasia)    Cancer (HCC)    colon   Cataract    Colonic mass 07/03/2011   Difficult intubation    extra long tube for intub per wife; states his neck is longer than regular people and slow to wake up    Erosive gastritis 07/03/2011   GERD (gastroesophageal reflux disease)    History of kidney stones    Kidney stones 2011   Renal calculus    Tooth infection 07/03/2011   Past Surgical History:  Procedure Laterality Date   CATARACT EXTRACTION W/PHACO Left 08/23/2012   Procedure: CATARACT EXTRACTION PHACO AND INTRAOCULAR LENS PLACEMENT (IOC);  Surgeon: Cherene Mania, MD;  Location: [facility];  Service: Ophthalmology;  Laterality: Left;  CDE 16.64   CATARACT EXTRACTION W/PHACO Right 06/09/2013   Procedure: CATARACT EXTRACTION PHACO AND INTRAOCULAR LENS PLACEMENT (IOC) CDE=10.86;  Surgeon: Cherene Mania, MD;  Location: [facility];  Service: Ophthalmology;  Laterality: Right;   COLON SURGERY     COLONOSCOPY  07/03/2011   Procedure: COLONOSCOPY;  Surgeon: Claudis RAYMOND Rivet, MD;  [facility];  Service: Endoscopy;  Laterality: N/A;   COLONOSCOPY N/A 09/16/2012   Procedure: COLONOSCOPY;  Surgeon: Claudis RAYMOND Rivet, MD;  [facility];  Service: Endoscopy;   Laterality: N/A;  930   COLONOSCOPY N/A 12/05/2015   Procedure: COLONOSCOPY;  Surgeon: Claudis RAYMOND Rivet, MD;  [facility];  Service: Endoscopy;  Laterality: N/A;  1200   COLONOSCOPY WITH PROPOFOL  N/A 02/06/2021   Procedure: COLONOSCOPY WITH PROPOFOL ;  Surgeon: Rivet Claudis RAYMOND, MD;  [facility];  Service: Endoscopy;  Laterality: N/A;  12:50   CYSTOSCOPY  07/09/2011   Procedure: CYSTOSCOPY FLEXIBLE;  Surgeon: Emery LILLETTE Blaze, MD;  Location: [facility];  Service: Urology;  Laterality: N/A;   CYSTOSCOPY WITH LITHOLAPAXY  07/11/2011   Procedure: CYSTOSCOPY WITH LITHOLAPAXY;  Surgeon: Emery LILLETTE Blaze, MD;  Location: [facility];  Service: Urology;  Laterality: N/A;   ESOPHAGOGASTRODUODENOSCOPY  07/03/2011   Procedure: ESOPHAGOGASTRODUODENOSCOPY (EGD);  Surgeon: Claudis RAYMOND Rivet, MD;  [facility];  Service: Endoscopy;  Laterality: N/A;   HERNIA REPAIR  2008    umbilical    INGUINAL HERNIA REPAIR Right 05/04/2017   Procedure: HERNIA REPAIR INGUINAL ADULT WITH MESH;  Surgeon: Mavis Anes, MD;  Location: [facility];  Service: General;  Laterality: Right;   PARTIAL COLECTOMY  07/04/2011   Procedure: PARTIAL COLECTOMY;  Surgeon: Anes DELENA Mavis, MD;  Location: [facility];  Service: General;  Laterality: N/A;   POLYPECTOMY  02/06/2021   Procedure: POLYPECTOMY;  Surgeon: Rivet Claudis RAYMOND, MD;  [facility];  Service: Endoscopy;;   Prostage cancer     PROSTATE BIOPSY     ROBOT ASSISTED LAPAROSCOPIC RADICAL PROSTATECTOMY Bilateral 01/19/2015   Procedure: ROBOTIC ASSISTED LAPAROSCOPIC RADICAL PROSTATECTOMY AND BILATERAL PELVIC LYMPH NODE DISSECTION, LAPARASCOPIC  LYSIS OF ADHESIONS;  Surgeon: Morene LELON Salines, MD;  Location: [facility];  Service: Urology;  Laterality: Bilateral;   TRANSURETHRAL RESECTION OF PROSTATE  07/11/2011   Procedure: TRANSURETHRAL RESECTION OF THE PROSTATE (TURP);  Surgeon: Mohammad I Javaid, MD;  Location: [facility];  Service: Urology;  Laterality: N/A;   Patient Active  Problem List   Diagnosis Date Noted   Left thyroid  nodule 01/13/2019   Non-recurrent unilateral inguinal hernia without obstruction or gangrene    GERD (gastroesophageal reflux disease) 01/26/2015   TIA (transient ischemic attack)    Transient global amnesia 01/25/2015   Prostate cancer (HCC) 08/29/2014   Kidney stone on left side 08/02/2013   Diarrhea 07/05/2013   Short-segment Barrett's esophagus 02/09/2012   Calculus, bladder 07/04/2011   Tooth infection 07/03/2011   Adenocarcinoma of colon (HCC) 07/03/2011   Erosive gastritis 07/03/2011   Microcytic hypochromic anemia 07/02/2011   Hx of colonic polyp 07/02/2011   Hyperglycemia 07/02/2011   BPH (benign prostatic hyperplasia) 07/02/2011    PCP: Shona Norleen PEDLAR, MD   REFERRING PROVIDER: Shona Norleen PEDLAR, MD   REFERRING DIAG:  R29.6 (ICD-10-CM) - Multiple falls  R26.9 (ICD-10-CM) - Gait abnormality    Rationale for Evaluation and Treatment:  Rehabiliation  THERAPY DIAG:  Other abnormalities of gait and mobility  Muscle weakness (generalized)  Repeated falls  ONSET DATE: Fall 2 weeks ago from 12/10/23   SUBJECTIVE:                                                                                                                                                                                           SUBJECTIVE STATEMENT: He denies any pain today, has had a 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 Yes. 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  DIAGNOSTIC FINDINGS:  Head CT 2024 IMPRESSION: No acute intracranial  abnormality  PATIENT SURVEYS:  Patient-Specific Activity Scoring Scheme  0 represents "unable to perform." 10 represents "able  to perform at prior level. 0 1 2 3 4 5 6 7 8 9  10 (Date and Score)   Activity Eval     1. Getting up from chair 4     2. walking 4                 Score 4/10    Total score = sum of the activity scores/number of activities Minimum detectable change (90%CI) for average score = 2 points Minimum detectable change (90%CI) for single activity score = 3 points   GAIT: Eval: Distance walked: 25 feet Assistive device utilized: None, but does use RW at home sometimes Level of assistance: SBA Comments: slow speed shuffling gait.     UPPER EXTREMITY MMT: Grossly 4+/5 MMT bilat     LOWER EXTREMITY MMT:   Grossly 4/5 MMT bilat   FUNCTIONAL TESTS:  Eval: 5 times sit to stand: 28 seconds Timed up and go (TUG): 24.09 3 minute walk test: 190 feet, HR 61, SPO2 96%  Berg Balance Scale:  Item Test date: 12/10/23 Test date: 01/14/24 Test date:   Sitting to standing 4. able to stand without using hands and stabilize independently  1) 4. able to stand without using hands and stabilize independently  4. able to stand safely for 2 minutes  4. able to sit safely and securely for 2 minutes   4. sits safely with minimal use of hands  4. able to transfer safely with minor use of hands  3. able to stand 10 seconds with supervision  3. able to place feet together independently and stand 1 minute with supervision  4. can reach forward confidently 25 cm (10 inches)    3. able to pick up slipper but needs supervision  4. looks behind from both sides and weight shifts well    2. able to turn 360 degrees safely but slowly  1. able to complete > 2 steps needs minimal assist    0. loses balance while stepping or standing   0. unable to try of needs assist to prevent fall       2. Standing unsupported 4. able to stand safely for 2 minutes    3. Sitting with  back unsupported, feet supported 4. able to sit safely and securely for 2 minutes    4. Standing to sitting 3. controls descent by using hands    5. Pivot transfer  4. able to transfer safely with minor use of hands    6. Standing unsupported with eyes closed 3. able to stand 10 seconds with supervision    7. Standing unsupported with feet together 3. able to place feet together independently and stand 1 minute with supervision    8. Reaching forward with outstretched arms while standing 4. can reach forward confidently 25 cm (10 inches)    9. Pick up object from the floor from standing 3. able to pick up slipper but needs supervision    10. Turning to look behind over left and right shoulders while standing 2. turns sideways but only maintains balance    11. Turn 360 degrees 2. able to turn 360 degrees safely but slowly    12. Place alternate foot on step or stool while standing unsupported 1. able to complete > 2 steps needs minimal assist    13. Standing unsupported one foot in front 0. loses balance while stepping or standing    14. Standing on one leg 0. unable to try of needs assist to prevent  fall      Total Score 37/56 Total Score 40/56 Total Score /56     12/28/23 Functional tests 5 times sit to stand: 21:45 seconds Timed up and go (TUG): 17.96 seconds 3 minute walk test: 300 feet, HR 86, SPO2 95%  01/14/24 Functional tests 5 times sit to stand: 20.62 seconds Timed up and go (TUG): 22.51  seconds 3 minute walk test: 275 feet, HR 86, SPO2 95%                                                                                                                             TREATMENT DATE:  01/14/24 Therex Nu step L1 X 15 min UE/LE seat #15 for endurance  [employer] Functional retesting, see above for details Leg press 40# 2X15 Leg extension machine 25# 2X15  Neuro re-Ed BERG balance   01/08/24 Therex Nu step L1 X 15 min UE/LE seat #15 for endurance  Neuromuscular  re-ed Walking march over obstacles  8 round trips  Stepping over and back across round bolster X 5 reps bilat March walking 10 feet X 6 Backward walking 10 feet X 6 Sidestepping 10 feet X 6  [employer]ivity Leg press 40# 2X15 Leg extension machine 25# 2X15 Walking throughout clinic with cues for bigger steps  01/04/24 Therex Nu step L1 X 10 min UE/LE seat #15 for endurance Neck rotation with AAROM 5 sec X 10 Neck extension 5 sec X 10 with strap SNAG Heat on his neck while reviewing HEP, did not include time on heat in treatment time  Neuromuscular re-ed Walking march over obstacles with SPC,  8 round trips   Theractivity Leg press 40# 2X15 Leg extension machine 25# 2X15 100 feet X 2 of walking with max cues and effort to take big strides, performed first rep with cane and it took 1:35 seconds. I then had him rest and drink water   then had him perform again and timed him without the cane and it took 1:09 seconds so he did not walk any faster using it today however even if it does not I do feel it helps him with his balance and reduces his risk of falling.    PATIENT EDUCATION: Education details: HEP, PT plan of care, selfcare Person educated: Patient Education method: Explanation, Demonstration, Verbal cues, and Handouts Education comprehension: verbalized understanding, further education recommended   HOME EXERCISE PROGRAM: Access Code: [email or web address] URL: [email or web address] Date: 12/10/2023 Prepared by: Redell Moose  Exercises - Sit to Stand - Increased Speed  - 2 x daily - 6 x weekly - 1 sets - 10 reps - Heel Toe Raises with Counter Support  - 2 x daily - 6 x weekly - 2 sets - 10 reps - Walking March  - 2 x daily - 6 x weekly - 3 sets - 10 reps - Standing Tandem Balance with Counter Support  - 2 x daily - 6 x weekly - 1 sets - 3 reps - 30  sec hold - Backward Walking with Counter Support  - 2 x daily - 6 x weekly - 3 sets - 10 reps  ASSESSMENT:  CLINICAL  IMPRESSION: 10th visit progress note shows he improved in BERG balance test as well as 5 times sit to stand test but not by much and not yet at goal level. He also decreased in his TUG test time and 3 minute walk test time. This may be due to fatigue and PT will retest these in beginning of session in 2 weeks. Those test are still better than day one with PT but not as good as they were on 12/28/23  OBJECTIVE IMPAIRMENTS: decreased activity tolerance for ADL's, difficulty walking, decreased balance, decreased endurance, decreased mobility, decreased ROM, decreased strength, impaired flexibility, and pain.  ACTIVITY LIMITATIONS: bending, lifting, carry, locomotion, PERSONAL FACTORS: see above PMH  also affecting patient's functional outcome.  REHAB POTENTIAL: Good  CLINICAL DECISION MAKING: Evolving/moderate complexity  EVALUATION COMPLEXITY: Moderate    GOALS: Short term PT Goals Target date: 01/07/2024   Pt will be I and compliant with HEP. Baseline: no HEP Goal status: ongoing 8/06/06/23, some compliance but not full compliance Pt will improve 5 times sit to stand test to less than 23 seconds to show improved leg strength and balance Baseline:28 seconds Goal status: MET 12/28/23  Long term PT goals Target date:02/04/2024   Pt will improve Timed up and go (TUG) time to less than 18 seconds and also 5 times sit to stand test to less than 18 seconds to show improved gait speed and balance Baseline:24 sec Goal status: MET 12/28/23 Pt will improve  strength to at least 4+/5 MMT to improve functional strength Baseline:4/5 Goal status: ongoing 01/14/24 Pt will improve Patient specific functional scale (PSFS) to at least 7 /10 to show improved function level Baseline:4/10 Goal status: ongoing 01/14/24 Pt will improve 3 minute walk test to >250 feet to improve community access Baseline:190 feet Goal status: MET 12/28/23 Pt will improve BERG balance test to >45/56 to show improved  balance Baseline:37/56 Goal status: ongoing 01/14/24  PLAN: PT FREQUENCY: 1-3 times per week   PT DURATION: 4-6 weeks  PLANNED INTERVENTIONS (unless contraindicated):  97110-Therapeutic exercises, 97530- Therapeutic activity, 97112- Neuromuscular re-education, 97535- Self Care, [id number]- Manual therapy, 97116- Gait training, and Balance training  PLAN FOR NEXT SESSION: use cane more,  work on gait, balance, leg strength, big movements.  NEXT MD VISIT: 12/01/24    Redell JONELLE Moose, PT,DPT 01/14/2024, 10:33 AM

## 2024-01-18 ENCOUNTER — Encounter: Payer: Self-pay | Admitting: Physical Therapy

## 2024-01-18 ENCOUNTER — Ambulatory Visit: Admitting: Physical Therapy

## 2024-01-18 DIAGNOSIS — M6281 Muscle weakness (generalized): Secondary | ICD-10-CM | POA: Diagnosis not present

## 2024-01-18 DIAGNOSIS — R296 Repeated falls: Secondary | ICD-10-CM

## 2024-01-18 DIAGNOSIS — R2689 Other abnormalities of gait and mobility: Secondary | ICD-10-CM | POA: Diagnosis not present

## 2024-01-18 NOTE — Therapy (Signed)
 OUTPATIENT PHYSICAL THERAPY TREATMENT    Patient Name: William Burns MRN: [id number] DOB:27-Mar-1946, 78 y.o., male Today's Date: 01/18/2024  END OF SESSION:  PT End of Session - 01/18/24 1530     Visit Number 11    Number of Visits 12    Date for PT Re-Evaluation 01/21/24    Authorization Type --    Progress Note Due on Visit 20    PT Start Time 1515    PT Stop Time 1555    PT Time Calculation (min) 40 min    Activity Tolerance Patient tolerated treatment well    Behavior During Therapy [facility] for tasks assessed/performed            Past Medical History:  Diagnosis Date   BPH (benign prostatic hyperplasia)    Cancer (HCC)    colon   Cataract    Colonic mass 07/03/2011   Difficult intubation    extra long tube for intub per wife; states his neck is longer than regular people and slow to wake up    Erosive gastritis 07/03/2011   GERD (gastroesophageal reflux disease)    History of kidney stones    Kidney stones 2011   Renal calculus    Tooth infection 07/03/2011   Past Surgical History:  Procedure Laterality Date   CATARACT EXTRACTION W/PHACO Left 08/23/2012   Procedure: CATARACT EXTRACTION PHACO AND INTRAOCULAR LENS PLACEMENT (IOC);  Surgeon: Cherene Mania, MD;  Location: [facility];  Service: Ophthalmology;  Laterality: Left;  CDE 16.64   CATARACT EXTRACTION W/PHACO Right 06/09/2013   Procedure: CATARACT EXTRACTION PHACO AND INTRAOCULAR LENS PLACEMENT (IOC) CDE=10.86;  Surgeon: Cherene Mania, MD;  Location: [facility];  Service: Ophthalmology;  Laterality: Right;   COLON SURGERY     COLONOSCOPY  07/03/2011   Procedure: COLONOSCOPY;  Surgeon: Claudis RAYMOND Rivet, MD;  [facility];  Service: Endoscopy;  Laterality: N/A;   COLONOSCOPY N/A 09/16/2012   Procedure: COLONOSCOPY;  Surgeon: Claudis RAYMOND Rivet, MD;  [facility];  Service: Endoscopy;  Laterality: N/A;  930   COLONOSCOPY N/A 12/05/2015   Procedure: COLONOSCOPY;  Surgeon: Claudis RAYMOND Rivet, MD;  [facility];  Service: Endoscopy;  Laterality: N/A;  1200   COLONOSCOPY WITH PROPOFOL  N/A 02/06/2021   Procedure: COLONOSCOPY WITH PROPOFOL ;  Surgeon: Rivet Claudis RAYMOND, MD;  [facility];  Service: Endoscopy;  Laterality: N/A;  12:50   CYSTOSCOPY  07/09/2011   Procedure: CYSTOSCOPY FLEXIBLE;  Surgeon: Emery LILLETTE Blaze, MD;  Location: [facility];  Service: Urology;  Laterality: N/A;   CYSTOSCOPY WITH LITHOLAPAXY  07/11/2011   Procedure: CYSTOSCOPY WITH LITHOLAPAXY;  Surgeon: Emery LILLETTE Blaze, MD;  Location: [facility];  Service: Urology;  Laterality: N/A;   ESOPHAGOGASTRODUODENOSCOPY  07/03/2011   Procedure: ESOPHAGOGASTRODUODENOSCOPY (EGD);  Surgeon: Claudis RAYMOND Rivet, MD;  [facility];  Service: Endoscopy;  Laterality: N/A;   HERNIA REPAIR  2008    umbilical    INGUINAL HERNIA REPAIR Right 05/04/2017   Procedure: HERNIA REPAIR INGUINAL ADULT WITH MESH;  Surgeon: Mavis Anes, MD;  Location: [facility];  Service: General;  Laterality: Right;   PARTIAL COLECTOMY  07/04/2011   Procedure: PARTIAL COLECTOMY;  Surgeon: Anes DELENA Mavis, MD;  Location: [facility];  Service: General;  Laterality: N/A;   POLYPECTOMY  02/06/2021   Procedure: POLYPECTOMY;  Surgeon: Rivet Claudis RAYMOND, MD;  [facility];  Service: Endoscopy;;   Prostage cancer     PROSTATE BIOPSY  ROBOT ASSISTED LAPAROSCOPIC RADICAL PROSTATECTOMY Bilateral 01/19/2015   Procedure: ROBOTIC ASSISTED LAPAROSCOPIC RADICAL PROSTATECTOMY AND BILATERAL PELVIC LYMPH NODE DISSECTION, LAPARASCOPIC  LYSIS OF ADHESIONS;  Surgeon: Morene LELON Salines, MD;  Location: [facility];  Service: Urology;  Laterality: Bilateral;   TRANSURETHRAL RESECTION OF PROSTATE  07/11/2011   Procedure: TRANSURETHRAL RESECTION OF THE PROSTATE (TURP);  Surgeon: Mohammad I Javaid, MD;  Location: [facility];  Service: Urology;  Laterality: N/A;   Patient Active Problem List   Diagnosis Date Noted   Left thyroid  nodule 01/13/2019   Non-recurrent unilateral inguinal hernia without  obstruction or gangrene    GERD (gastroesophageal reflux disease) 01/26/2015   TIA (transient ischemic attack)    Transient global amnesia 01/25/2015   Prostate cancer (HCC) 08/29/2014   Kidney stone on left side 08/02/2013   Diarrhea 07/05/2013   Short-segment Barrett's esophagus 02/09/2012   Calculus, bladder 07/04/2011   Tooth infection 07/03/2011   Adenocarcinoma of colon (HCC) 07/03/2011   Erosive gastritis 07/03/2011   Microcytic hypochromic anemia 07/02/2011   Hx of colonic polyp 07/02/2011   Hyperglycemia 07/02/2011   BPH (benign prostatic hyperplasia) 07/02/2011    PCP: Shona Norleen PEDLAR, MD   REFERRING PROVIDER: Shona Norleen PEDLAR, MD   REFERRING DIAG:  R29.6 (ICD-10-CM) - Multiple falls  R26.9 (ICD-10-CM) - Gait abnormality    Rationale for Evaluation and Treatment:  Rehabiliation  THERAPY DIAG:  Other abnormalities of gait and mobility  Muscle weakness (generalized)  Repeated falls  ONSET DATE: Fall 2 weeks ago from 12/10/23   SUBJECTIVE:                                                                                                                                                                                           SUBJECTIVE STATEMENT: He denies pain. Feels okay today  PERTINENT HISTORY:  See above PMH  PAIN:  Denies pain   PRECAUTIONS: ,  Fall  RED FLAGS: None   WEIGHT BEARING RESTRICTIONS:  No  FALLS:  Has patient fallen in last 6 months? Yes. 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  DIAGNOSTIC FINDINGS:  Head CT 2024 IMPRESSION: No acute intracranial abnormality  PATIENT SURVEYS:  Patient-Specific Activity Scoring Scheme  0 represents "unable to perform." 10 represents "able to perform at prior level. 0 1 2 3 4 5 6 7 8 9  10 (Date  and Score)   Activity Eval     1. Getting up  from chair 4     2. walking 4                 Score 4/10    Total score = sum of the activity scores/number of activities Minimum detectable change (90%CI) for average score = 2 points Minimum detectable change (90%CI) for single activity score = 3 points   GAIT: Eval: Distance walked: 25 feet Assistive device utilized: None, but does use RW at home sometimes Level of assistance: SBA Comments: slow speed shuffling gait.     UPPER EXTREMITY MMT: Grossly 4+/5 MMT bilat     LOWER EXTREMITY MMT:   Grossly 4/5 MMT bilat   FUNCTIONAL TESTS:  Eval: 5 times sit to stand: 28 seconds Timed up and go (TUG): 24.09 3 minute walk test: 190 feet, HR 61, SPO2 96%  Berg Balance Scale:  Item Test date: 12/10/23 Test date: 01/14/24 Test date:   Sitting to standing 4. able to stand without using hands and stabilize independently  1) 4. able to stand without using hands and stabilize independently  4. able to stand safely for 2 minutes  4. able to sit safely and securely for 2 minutes   4. sits safely with minimal use of hands  4. able to transfer safely with minor use of hands  3. able to stand 10 seconds with supervision  3. able to place feet together independently and stand 1 minute with supervision  4. can reach forward confidently 25 cm (10 inches)    3. able to pick up slipper but needs supervision  4. looks behind from both sides and weight shifts well    2. able to turn 360 degrees safely but slowly  1. able to complete > 2 steps needs minimal assist    0. loses balance while stepping or standing   0. unable to try of needs assist to prevent fall       2. Standing unsupported 4. able to stand safely for 2 minutes    3. Sitting with back unsupported, feet supported 4. able to sit safely and securely for 2 minutes    4. Standing to sitting 3. controls descent by using hands    5. Pivot transfer  4. able to transfer  safely with minor use of hands    6. Standing unsupported with eyes closed 3. able to stand 10 seconds with supervision    7. Standing unsupported with feet together 3. able to place feet together independently and stand 1 minute with supervision    8. Reaching forward with outstretched arms while standing 4. can reach forward confidently 25 cm (10 inches)    9. Pick up object from the floor from standing 3. able to pick up slipper but needs supervision    10. Turning to look behind over left and right shoulders while standing 2. turns sideways but only maintains balance    11. Turn 360 degrees 2. able to turn 360 degrees safely but slowly    12. Place alternate foot on step or stool while standing unsupported 1. able to complete > 2 steps needs minimal assist    13. Standing unsupported one foot in front 0. loses balance while stepping or standing    14. Standing on one leg 0. unable to try of needs assist to prevent fall      Total Score 37/56 Total Score 40/56 Total Score /56     12/28/23 Functional tests 5 times sit to stand: 21:45 seconds Timed  up and go (TUG): 17.96 seconds 3 minute walk test: 300 feet, HR 86, SPO2 95%  01/14/24 Functional tests 5 times sit to stand: 20.62 seconds Timed up and go (TUG): 22.51  seconds 3 minute walk test: 275 feet, HR 86, SPO2 95%                                                                                                                             TREATMENT DATE:  01/18/24 Therex Nu step L1 X 15 min UE/LE seat #15 for endurance  Neuromuscular re-ed Walking march over obstacles  8 round trips  March walking 10 feet X 6 Backward walking 10 feet X 6 Sidestepping 10 feet X 6  [employer] Leg press 45# 2X15 Leg extension machine 30# 2X15 Sit to stand no UE support, power up, then slow lower X 10 reps Walking throughout clinic 200 feet with cues for bigger steps, using [facility]  01/14/24 Therex Nu step L1 X 15 min UE/LE seat #15 for  endurance  [employer] Functional retesting, see above for details Leg press 40# 2X15 Leg extension machine 25# 2X15  Neuro re-Ed BERG balance   PATIENT EDUCATION: Education details: HEP, PT plan of care, selfcare Person educated: Patient Education method: Explanation, Demonstration, Verbal cues, and Handouts Education comprehension: verbalized understanding, further education recommended   HOME EXERCISE PROGRAM: Access Code: [email or web address] URL: [email or web address] Date: 12/10/2023 Prepared by: Redell Moose  Exercises - Sit to Stand - Increased Speed  - 2 x daily - 6 x weekly - 1 sets - 10 reps - Heel Toe Raises with Counter Support  - 2 x daily - 6 x weekly - 2 sets - 10 reps - Walking March  - 2 x daily - 6 x weekly - 3 sets - 10 reps - Standing Tandem Balance with Counter Support  - 2 x daily - 6 x weekly - 1 sets - 3 reps - 30 sec hold - Backward Walking with Counter Support  - 2 x daily - 6 x weekly - 3 sets - 10 reps  ASSESSMENT:  CLINICAL IMPRESSION: Movement quality was the best I have seen him since starting PT but still limited by slow gait speed, balance, and decreased endurance that we are working to improve as tolerated with PT.  OBJECTIVE IMPAIRMENTS: decreased activity tolerance for ADL's, difficulty walking, decreased balance, decreased endurance, decreased mobility, decreased ROM, decreased strength, impaired flexibility, and pain.  ACTIVITY LIMITATIONS: bending, lifting, carry, locomotion, PERSONAL FACTORS: see above PMH  also affecting patient's functional outcome.  REHAB POTENTIAL: Good  CLINICAL DECISION MAKING: Evolving/moderate complexity  EVALUATION COMPLEXITY: Moderate    GOALS: Short term PT Goals Target date: 01/07/2024   Pt will be I and compliant with HEP. Baseline: no HEP Goal status: ongoing 8/06/06/23, some compliance but not full compliance Pt will improve 5 times sit to stand test to less than 23 seconds to show improved  leg strength and balance Baseline:28 seconds Goal status: MET  12/28/23  Long term PT goals Target date:02/04/2024   Pt will improve Timed up and go (TUG) time to less than 18 seconds and also 5 times sit to stand test to less than 18 seconds to show improved gait speed and balance Baseline:24 sec Goal status: MET 12/28/23 Pt will improve  strength to at least 4+/5 MMT to improve functional strength Baseline:4/5 Goal status: ongoing 01/14/24 Pt will improve Patient specific functional scale (PSFS) to at least 7 /10 to show improved function level Baseline:4/10 Goal status: ongoing 01/14/24 Pt will improve 3 minute walk test to >250 feet to improve community access Baseline:190 feet Goal status: MET 12/28/23 Pt will improve BERG balance test to >45/56 to show improved balance Baseline:37/56 Goal status: ongoing 01/14/24  PLAN: PT FREQUENCY: 1-3 times per week   PT DURATION: 4-6 weeks  PLANNED INTERVENTIONS (unless contraindicated):  97110-Therapeutic exercises, 97530- Therapeutic activity, 97112- Neuromuscular re-education, 97535- Self Care, [id number]- Manual therapy, [id number]- Gait training, and Balance training  PLAN FOR NEXT SESSION: use cane more,  work on gait, balance, leg strength, big movements.  NEXT MD VISIT: 12/01/24    Redell JONELLE Moose, PT,DPT 01/18/2024, 4:24 PM

## 2024-01-21 ENCOUNTER — Encounter: Admitting: Physical Therapy

## 2024-01-25 ENCOUNTER — Encounter: Payer: Self-pay | Admitting: Neurology

## 2024-01-25 ENCOUNTER — Ambulatory Visit (INDEPENDENT_AMBULATORY_CARE_PROVIDER_SITE_OTHER): Admitting: Neurology

## 2024-01-25 ENCOUNTER — Ambulatory Visit: Admitting: Physical Therapy

## 2024-01-25 VITALS — BP 138/74 | Ht 75.0 in | Wt 245.0 lb

## 2024-01-25 DIAGNOSIS — G629 Polyneuropathy, unspecified: Secondary | ICD-10-CM | POA: Diagnosis not present

## 2024-01-25 DIAGNOSIS — G40909 Epilepsy, unspecified, not intractable, without status epilepticus: Secondary | ICD-10-CM

## 2024-01-25 DIAGNOSIS — G20C Parkinsonism, unspecified: Secondary | ICD-10-CM | POA: Diagnosis not present

## 2024-01-25 DIAGNOSIS — M542 Cervicalgia: Secondary | ICD-10-CM

## 2024-01-25 DIAGNOSIS — R269 Unspecified abnormalities of gait and mobility: Secondary | ICD-10-CM | POA: Diagnosis not present

## 2024-01-25 DIAGNOSIS — G3184 Mild cognitive impairment, so stated: Secondary | ICD-10-CM

## 2024-01-25 NOTE — Progress Notes (Signed)
 [facility]  PATIENT: William Burns DOB: [birth year]  REQUESTING CLINICIAN: Shona Norleen PEDLAR, MD HISTORY FROM: Patient and spouse  REASON FOR VISIT: Hx of seizure and worsening memory    HISTORICAL  CHIEF COMPLAINT:  Chief Complaint  Patient presents with   Follow-up    Rm 13 Memory loss, left side head and neck pain   INTERVAL HISTORY 01/25/2024 Patient presents today for follow-up, he is accompanied by son.  Last visit was on July 1.  At that time, wife was complaining of worsening of memory, confusion and additional falls.  He was referred to physical therapy for gait training and his therapist had some concerns about parkinsonism, and patient was advised to discuss possible Parkinson disease. Son tells me since last visit, his memory is getting worse, he is getting more confused.  He is still working in his farm. They tell me that he is still independent in his ADLs, but has some physical limitation.  His main concern however is his neck pain and limited range of motion. He attributes it to a new recliner but son states the neck pain was present prior to the new recliner. He tells me that he has not tried any medication for the neck pain.    INTERVAL HISTORY 12/01/2023 Patient presents today for follow-up, last visit was in October.  He is accompanied by wife.  Since last visit, he has not had any seizure or seizure like activity.  He is compliant with his Depakote  250 mg twice daily.  In terms of the neuropathy, taking gabapentin  600 mg at night is enough to control his pain.  Tells me currently he is doing well.   For his cognitive impairment, he was on Aricept  but did have vivid dreams therefore medication discontinued.  Wife tells me that his memory is still getting poor, he is forgetful, and lately has been easily irritable.  He does work on his farm daily, but has been noted to be dehydrated causing him multiple falls.  Wife is also concerned about his gait, most  the time he uses a device with ambulation but sometimes he will not use a device and will fall, last fall was last week when patient was found by her son next to his tractor.   INTERVAL HISTORY 03/19/2023 Patient presents today for follow-up, he is accompanied by wife.  Last visit was in April since then she has been doing well, she denies any seizure or seizure activity.  He is compliant with the Depakote .  His main complaint today is his neuropathy.  He reports at the end of the day his pain is high.  He takes the gabapentin  300 mg in the afternoon and 300 mg at bedtime.   In terms of his memory, he is stable, his ATN profile was negative for Alzheimer disease biomarkers.   INTERVAL HISTORY 09/23/2022:  Patient presents today for follow-up, he is accompanied by wife.  Last visit was 6 months ago.  Since then he has been doing well.  He denies any seizure or seizure-like activity, compliant with his depakote . He reports that he was recently diagnosed with a blood clot but it was in the superficial vein, he was on Eliquis , completed treatment and now on aspirin  alone.  He still complaining of memory issues.  He was recently diagnosed with hearing loss and will be evaluated for hearing aids.  He reports that his main issue is remembering names of people that he knew before.  Wife reported  he gets active aggravated when he does not remember the names.  Both parents have dementia.  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 When it comes to his peripheral neuropathy, at last visit I have advised him to increase the gabapentin  to 200 mg up to 3 times a day.  They have not proceeded with the increase, he is still on Gabapentin  200 mg nightly.   INTERVAL HISTORY 03/20/2022:  Patient presents today for follow-up, he is accompanied by his wife.  Last visit was in January, since then he has not had episodes of seizure or seizure-like activity.  He is compliant with the Depakote . At his last visit with a podiatrist he  complained of numbness in the bilateral feet.  Due to that he was referred to neurology.  He reports the numbness and pain started about 6 months ago.  He started at the toes and all the way to the ankle.  Described the pain as aching, denies any burning or stabbing pain.  He does have history of bilateral hammertoe with pain.  He denies any recent falls. He was started on Gabapentin  100 mg, uptitrated to 200 mg at night with minimal relief.    HISTORY OF PRESENT ILLNESS:  This is a 78 year old gentleman with past medical history of colon cancer, BPH, seizures and worsening memory who is presenting to establish care.  Patient reported he had his first lifetime seizure on June 25, 2020.  Per wife he was around 12:45 at night when she noted patient starts shaking on the bed, all body shaking, she could not stop it.  EMS was called and patient was taken to the ED.  He was noted to have postictal confusion.  No reported tongue biting or urinary incontinence.  He was back to his normal self and discharged home with neurology follow-up.  Patient reported he follow-up with a neurologist, had a EEG, was told that the EEG was abnormal (showed seizures) and he was started on antiseizure medication. He was started on Depakote  250 mg twice daily.  Since then he has been compliant with the Depakote , denies any additional seizures but has not followed up with neurology for the past year.  In terms of his memory problem, he describes issues with recognizing people face, trouble with remembering people's names,  wife reports that he will asked the same questions over and over and sometimes repeat himself too.  Wife also has to remind him of events.  Other than that, he is still very active, he working in his farm, he still drives, denies any accident, denies being lost in familiar places.  He is currently on Namenda  5 mg twice daily. Wife handles all finances, she has been doing it for many years.   OTHER MEDICAL  CONDITIONS: Colon cancer, BPH, seizure disorder, memory problem,   REVIEW OF SYSTEMS: Full 14 system review of systems performed and negative with exception of: As noted in HPI  ALLERGIES: No Known Allergies  HOME MEDICATIONS: Outpatient Medications Prior to Visit  Medication Sig Dispense Refill   aspirin  EC 81 MG tablet Take 81 mg by mouth daily. Swallow whole.     Cholecalciferol (VITAMIN D3) 125 MCG (5000 UT) TABS Take by mouth in the morning.     divalproex  (DEPAKOTE ) 250 MG DR tablet Take 1 tablet (250 mg total) by mouth in the morning and at bedtime. 180 tablet 3   gabapentin  (NEURONTIN ) 300 MG capsule Take 1 capsule (300 mg total) by mouth 3 (three) times  daily. 540 capsule 2   Lactobacillus (ACIDOPHILUS PO) Take 1 capsule by mouth in the morning. Now Pb8 Acidophilus     loratadine (CLARITIN) 10 MG tablet Take 10 mg by mouth in the morning.     memantine  (NAMENDA ) 10 MG tablet Take 1 tablet (10 mg total) by mouth 2 (two) times daily. 180 tablet 3   Multiple Vitamin (MULTIVITAMIN WITH MINERALS) TABS tablet Take 1 tablet by mouth in the morning. Centrum Silver for Men 50+     Omega-3 Fatty Acids (FISH OIL) 1200 MG CAPS Take 1,200 mg by mouth in the morning.     pantoprazole  (PROTONIX ) 40 MG tablet TAKE 1 TABLET BY MOUTH ONCE DAILY. 90 tablet 3   rivastigmine  (EXELON ) 1.5 MG capsule Take 1 capsule (1.5 mg total) by mouth 2 (two) times daily. (Patient not taking: Reported on 01/25/2024) 180 capsule 3   No facility-administered medications prior to visit.    PAST MEDICAL HISTORY: Past Medical History:  Diagnosis Date   BPH (benign prostatic hyperplasia)    Cancer (HCC)    colon   Cataract    Colonic mass 07/03/2011   Difficult intubation    extra long tube for intub per wife; states his neck is longer than regular people and slow to wake up    Erosive gastritis 07/03/2011   GERD (gastroesophageal reflux disease)    History of kidney stones    Kidney stones 2011   Renal  calculus    Tooth infection 07/03/2011    PAST SURGICAL HISTORY: Past Surgical History:  Procedure Laterality Date   CATARACT EXTRACTION W/PHACO Left 08/23/2012   Procedure: CATARACT EXTRACTION PHACO AND INTRAOCULAR LENS PLACEMENT (IOC);  Surgeon: Cherene Mania, MD;  Location: [facility];  Service: Ophthalmology;  Laterality: Left;  CDE 16.64   CATARACT EXTRACTION W/PHACO Right 06/09/2013   Procedure: CATARACT EXTRACTION PHACO AND INTRAOCULAR LENS PLACEMENT (IOC) CDE=10.86;  Surgeon: Cherene Mania, MD;  Location: [facility];  Service: Ophthalmology;  Laterality: Right;   COLON SURGERY     COLONOSCOPY  07/03/2011   Procedure: COLONOSCOPY;  Surgeon: Claudis RAYMOND Rivet, MD;  [facility]: [facility];  Service: Endoscopy;  Laterality: N/A;   COLONOSCOPY N/A 09/16/2012   Procedure: COLONOSCOPY;  Surgeon: Claudis RAYMOND Rivet, MD;  [facility];  Service: Endoscopy;  Laterality: N/A;  930   COLONOSCOPY N/A 12/05/2015   Procedure: COLONOSCOPY;  Surgeon: Claudis RAYMOND Rivet, MD;  [facility];  Service: Endoscopy;  Laterality: N/A;  1200   COLONOSCOPY WITH PROPOFOL  N/A 02/06/2021   Procedure: COLONOSCOPY WITH PROPOFOL ;  Surgeon: Rivet Claudis RAYMOND, MD;  [facility];  Service: Endoscopy;  Laterality: N/A;  12:50   CYSTOSCOPY  07/09/2011   Procedure: CYSTOSCOPY FLEXIBLE;  Surgeon: Emery LILLETTE Blaze, MD;  Location: [facility];  Service: Urology;  Laterality: N/A;   CYSTOSCOPY WITH LITHOLAPAXY  07/11/2011   Procedure: CYSTOSCOPY WITH LITHOLAPAXY;  Surgeon: Emery LILLETTE Blaze, MD;  Location: [facility];  Service: Urology;  Laterality: N/A;   ESOPHAGOGASTRODUODENOSCOPY  07/03/2011   Procedure: ESOPHAGOGASTRODUODENOSCOPY (EGD);  Surgeon: Claudis RAYMOND Rivet, MD;  [facility];  Service: Endoscopy;  Laterality: N/A;   HERNIA REPAIR  2008    umbilical    INGUINAL HERNIA REPAIR Right 05/04/2017   Procedure: HERNIA REPAIR INGUINAL ADULT WITH MESH;  Surgeon: Mavis Anes, MD;  Location: [facility];  Service: General;   Laterality: Right;   PARTIAL COLECTOMY  07/04/2011   Procedure: PARTIAL COLECTOMY;  Surgeon: Anes LABOR  Mavis, MD;  Location: [facility];  Service: General;  Laterality: N/A;   POLYPECTOMY  02/06/2021   Procedure: POLYPECTOMY;  Surgeon: Golda Claudis PENNER, MD;  [facility];  Service: Endoscopy;;   Prostage cancer     PROSTATE BIOPSY     ROBOT ASSISTED LAPAROSCOPIC RADICAL PROSTATECTOMY Bilateral 01/19/2015   Procedure: ROBOTIC ASSISTED LAPAROSCOPIC RADICAL PROSTATECTOMY AND BILATERAL PELVIC LYMPH NODE DISSECTION, LAPARASCOPIC  LYSIS OF ADHESIONS;  Surgeon: Morene LELON Salines, MD;  Location: [facility];  Service: Urology;  Laterality: Bilateral;   TRANSURETHRAL RESECTION OF PROSTATE  07/11/2011   Procedure: TRANSURETHRAL RESECTION OF THE PROSTATE (TURP);  Surgeon: Mohammad I Javaid, MD;  Location: [facility];  Service: Urology;  Laterality: N/A;    FAMILY HISTORY: Family History  Problem Relation Age of Onset   Alzheimer's disease Mother    Alzheimer's disease Father    Aneurysm Brother        brain   Anuerysm Brother    Healthy Son    Healthy Son    Colon cancer Neg Hx     SOCIAL HISTORY: Social History   Socioeconomic History   Marital status: Married    Spouse name: Jeremaine Maraj   Number of children: 2   Years of education: 12   Highest education level: 12th grade  Occupational History   Occupation: farmer    Comment: tobacco; cattle; poultry  Tobacco Use   Smoking status: Former    Current packs/day: 0.00    Average packs/day: 2.0 packs/day for 25.0 years (50.0 ttl pk-yrs)    Types: Cigarettes    Start date: 06/02/1964    Quit date: 06/02/1989    Years since quitting: 34.6    Passive exposure: Past   Smokeless tobacco: Never  Vaping Use   Vaping status: Never Used  Substance and Sexual Activity   Alcohol use: No   Drug use: No   Sexual activity: Not Currently    Partners: Female  Other Topics Concern   Not on file  Social History Narrative   Right handed    Caffeine-  none    Lives at home with wife Joen    Social Drivers of Health   Financial Resource Strain: Low Risk  (01/01/2022)   Overall Financial Resource Strain (CARDIA)    Difficulty of Paying Living Expenses: Not hard at all  Food Insecurity: No Food Insecurity (01/01/2022)   Hunger Vital Sign    Worried About Running Out of Food in the Last Year: Never true    Ran Out of Food in the Last Year: Never true  Transportation Needs: No Transportation Needs (01/01/2022)   PRAPARE - Administrator, Civil Service (Medical): No    Lack of Transportation (Non-Medical): No  Physical Activity: Inactive (01/01/2022)   Exercise Vital Sign    Days of Exercise per Week: 0 days    Minutes of Exercise per Session: 0 min  Stress: No Stress Concern Present (01/01/2022)   [employer] of Occupational Health - Occupational Stress Questionnaire    Feeling of Stress : Only a little  Social Connections: Socially Integrated (01/01/2022)   Social Connection and Isolation Panel    Frequency of Communication with Friends and Family: More than three times a week    Frequency of Social Gatherings with Friends and Family: More than three times a week    Attends Religious Services: More than 4 times per year    Active Member of [employer] or Organizations: Yes  Attends Banker Meetings: More than 4 times per year    Marital Status: Married  Catering manager Violence: Not At Risk (01/01/2022)   Humiliation, Afraid, Rape, and Kick questionnaire    Fear of Current or Ex-Partner: No    Emotionally Abused: No    Physically Abused: No    Sexually Abused: No    PHYSICAL EXAM  GENERAL EXAM/CONSTITUTIONAL: Vitals:  Vitals:   01/25/24 1429  BP: 138/74  SpO2: 96%  Weight: 245 lb (111.1 kg)  Height: 6' 3 (1.905 m)      Body mass index is 30.62 kg/m. Wt Readings from Last 3 Encounters:  01/25/24 245 lb (111.1 kg)  11/25/23 245 lb (111.1 kg)  10/07/23 246 lb (111.6 kg)   Patient is in no  distress; well developed, nourished and groomed; neck is supple  MUSCULOSKELETAL: Gait, strength, tone, movements noted in Neurologic exam below  NEUROLOGIC: MENTAL STATUS:      No data to display            01/25/2024    2:35 PM 06/27/2021    1:42 PM  Montreal Cognitive Assessment   Visuospatial/ Executive (0/5) 0 5  Naming (0/3) 2 2  Attention: Read list of digits (0/2) 2 2  Attention: Read list of letters (0/1) 1 1  Attention: Serial 7 subtraction starting at 100 (0/3) 1 3  Language: Repeat phrase (0/2) 2 1  Language : Fluency (0/1) 0 0  Abstraction (0/2) 2 2  Delayed Recall (0/5) 0 1  Orientation (0/6) 2 5  Total 12 22  Adjusted Score (based on education)  23    awake, alert, oriented to person, Difficulty with recent memory  Difficulty with attention  language fluent, comprehension intact, naming intact fund of knowledge appropriate  CRANIAL NERVE:  2nd, 3rd, 4th, 6th - visual fields full to confrontation, extraocular muscles intact, no nystagmus 5th - facial sensation symmetric 7th - facial strength symmetric 8th - hearing intact 9th - palate elevates symmetrically, uvula midline 11th - shoulder shrug symmetric 12th - tongue protrusion midline  MOTOR:  normal bulk and tone, full strength in the BUE, BLE. No increase in tone, and very mild bradykinesia. No resting tremors   SENSORY:  Decrease sensation to pinprick and vibration to bilateral feet up to ankle. Normal proprioception.   COORDINATION:  finger-nose-finger, fine finger movements normal  GAIT/STATION:  Wide based, slow and unsteady   DIAGNOSTIC DATA (LABS, IMAGING, TESTING) - I reviewed patient records, labs, notes, testing and imaging myself where available.  Lab Results  Component Value Date   WBC 9.2 11/25/2023   HGB 12.8 (L) 11/25/2023   HCT 39.4 11/25/2023   MCV 91.8 11/25/2023   PLT 222 11/25/2023      Component Value Date/Time   NA 139 11/25/2023 1852   NA 144 03/20/2022  1339   K 3.5 11/25/2023 1852   CL 102 11/25/2023 1852   CO2 26 11/25/2023 1852   GLUCOSE 107 (H) 11/25/2023 1852   BUN 15 11/25/2023 1852   BUN 11 03/20/2022 1339   CREATININE 0.88 11/25/2023 1852   CALCIUM  8.5 (L) 11/25/2023 1852   PROT 6.7 11/25/2023 1852   PROT 6.4 03/20/2022 1339   ALBUMIN 3.3 (L) 11/25/2023 1852   ALBUMIN 4.2 03/20/2022 1339   AST 31 11/25/2023 1852   ALT 16 11/25/2023 1852   ALKPHOS 68 11/25/2023 1852   BILITOT 0.8 11/25/2023 1852   BILITOT 0.2 03/20/2022 1339   GFRNONAA >60 11/25/2023 1852  GFRAA >60 02/07/2020 1123   Lab Results  Component Value Date   CHOL 167 01/26/2015   HDL 26 (L) 01/26/2015   LDLCALC 111 (H) 01/26/2015   TRIG 151 (H) 01/26/2015   CHOLHDL 6.4 01/26/2015   Lab Results  Component Value Date   HGBA1C 5.4 03/20/2022   Lab Results  Component Value Date   VITAMINB12 >2000 (H) 03/20/2022   Lab Results  Component Value Date   TSH 2.970 03/20/2022    Head CT 06/25/2020 1. No acute intracranial abnormalities. 2. Chronic atrophy and small vessel ischemic changes.   ASSESSMENT AND PLAN  78 y.o. year old male with history of colon cancer, BPH, seizure disorder, MCI who is presenting for follow up for his MCI and possible Parkinsonism as suggested by PT. On exam he does have decrease blink reflex and very mild bradykinesia but no increase rigidity and no tremors. Even though my suspicion is low, we will obtain a DATScan to rule out Parkinson disease.  For his cognitive impairment, family reports worsening memory, they also reports intact ADLs with physical limitation. His Moca score is worse today 12 compared to 20 months ago, 23. He was not able to tolerate both Aricept  and Exelon . Will continue him on Namenda . I will contact him to go over the results, otherwise, I will see him as scheduled.    1. Mild cognitive impairment   2. Cervicalgia   3. Neuropathy   4. Seizure disorder (HCC)   5. Gait abnormality   6. Parkinsonism,  unspecified Parkinsonism type [facility])      Patient Instructions  Will refer him back to physical therapy for his cervicalgia  Consider Aleve for the neck pain Will obtain Datscan  Discussed work limitation to avoid injuries  Continue to follow up with PCP  Return as scheduled   Orders Placed This Encounter  Procedures   NM BRAIN DATSCAN TUMOR LOC INFLAM SPECT 1 DAY   Ambulatory referral to Physical Therapy    No orders of the defined types were placed in this encounter.   No follow-ups on file.   Pastor Falling, MD 01/25/2024, 4:50 PM  [facility] [address], [address] [place], [place] [address] [phone]

## 2024-01-25 NOTE — Telephone Encounter (Signed)
 Family was concerned about parkinson disease. Please add him to the schedule.

## 2024-01-25 NOTE — Patient Instructions (Addendum)
 Will refer him back to physical therapy for his cervicalgia  Consider Aleve for the neck pain Will obtain Datscan  Discussed work limitation to avoid injuries  Continue to follow up with PCP  Return as scheduled

## 2024-01-26 ENCOUNTER — Telehealth: Payer: Self-pay | Admitting: Neurology

## 2024-01-26 NOTE — Telephone Encounter (Signed)
 Dat scan order sent to [facility] to schedule, no auth required. [phone]

## 2024-01-27 ENCOUNTER — Encounter: Payer: Self-pay | Admitting: Physical Therapy

## 2024-01-27 ENCOUNTER — Ambulatory Visit: Admitting: Physical Therapy

## 2024-01-27 DIAGNOSIS — R296 Repeated falls: Secondary | ICD-10-CM

## 2024-01-27 DIAGNOSIS — M6281 Muscle weakness (generalized): Secondary | ICD-10-CM

## 2024-01-27 DIAGNOSIS — R2689 Other abnormalities of gait and mobility: Secondary | ICD-10-CM

## 2024-01-27 NOTE — Therapy (Addendum)
 OUTPATIENT PHYSICAL THERAPY TREATMENT    Patient Name: William Burns MRN: [id number] DOB:May 29, 1946, 78 y.o., male Today's Date: 01/27/2024  END OF SESSION:  PT End of Session - 01/27/24 1107     Visit Number 12    Number of Visits 20    Date for PT Re-Evaluation 03/09/24    Authorization Type MCR    Progress Note Due on Visit 20    PT Start Time 1105    PT Stop Time 1145    PT Time Calculation (min) 40 min    Activity Tolerance Patient tolerated treatment well    Behavior During Therapy [facility] for tasks assessed/performed             Past Medical History:  Diagnosis Date   BPH (benign prostatic hyperplasia)    Cancer (HCC)    colon   Cataract    Colonic mass 07/03/2011   Difficult intubation    extra long tube for intub per wife; states his neck is longer than regular people and slow to wake up    Erosive gastritis 07/03/2011   GERD (gastroesophageal reflux disease)    History of kidney stones    Kidney stones 2011   Renal calculus    Tooth infection 07/03/2011   Past Surgical History:  Procedure Laterality Date   CATARACT EXTRACTION W/PHACO Left 08/23/2012   Procedure: CATARACT EXTRACTION PHACO AND INTRAOCULAR LENS PLACEMENT (IOC);  Surgeon: Cherene Mania, MD;  Location: [facility];  Service: Ophthalmology;  Laterality: Left;  CDE 16.64   CATARACT EXTRACTION W/PHACO Right 06/09/2013   Procedure: CATARACT EXTRACTION PHACO AND INTRAOCULAR LENS PLACEMENT (IOC) CDE=10.86;  Surgeon: Cherene Mania, MD;  Location: [facility];  Service: Ophthalmology;  Laterality: Right;   COLON SURGERY     COLONOSCOPY  07/03/2011   Procedure: COLONOSCOPY;  Surgeon: Claudis RAYMOND Rivet, MD;  [facility]: [facility];  Service: Endoscopy;  Laterality: N/A;   COLONOSCOPY N/A 09/16/2012   Procedure: COLONOSCOPY;  Surgeon: Claudis RAYMOND Rivet, MD;  [facility];  Service: Endoscopy;  Laterality: N/A;  930   COLONOSCOPY N/A 12/05/2015   Procedure: COLONOSCOPY;  Surgeon: Claudis RAYMOND Rivet, MD;  [facility];  Service: Endoscopy;  Laterality: N/A;  1200   COLONOSCOPY WITH PROPOFOL  N/A 02/06/2021   Procedure: COLONOSCOPY WITH PROPOFOL ;  Surgeon: Rivet Claudis RAYMOND, MD;  [facility];  Service: Endoscopy;  Laterality: N/A;  12:50   CYSTOSCOPY  07/09/2011   Procedure: CYSTOSCOPY FLEXIBLE;  Surgeon: Emery LILLETTE Blaze, MD;  Location: [facility];  Service: Urology;  Laterality: N/A;   CYSTOSCOPY WITH LITHOLAPAXY  07/11/2011   Procedure: CYSTOSCOPY WITH LITHOLAPAXY;  Surgeon: Emery LILLETTE Blaze, MD;  Location: [facility];  Service: Urology;  Laterality: N/A;   ESOPHAGOGASTRODUODENOSCOPY  07/03/2011   Procedure: ESOPHAGOGASTRODUODENOSCOPY (EGD);  Surgeon: Claudis RAYMOND Rivet, MD;  [facility];  Service: Endoscopy;  Laterality: N/A;   HERNIA REPAIR  2008    umbilical    INGUINAL HERNIA REPAIR Right 05/04/2017   Procedure: HERNIA REPAIR INGUINAL ADULT WITH MESH;  Surgeon: Mavis Anes, MD;  Location: [facility];  Service: General;  Laterality: Right;   PARTIAL COLECTOMY  07/04/2011   Procedure: PARTIAL COLECTOMY;  Surgeon: Anes DELENA Mavis, MD;  Location: [facility];  Service: General;  Laterality: N/A;   POLYPECTOMY  02/06/2021   Procedure: POLYPECTOMY;  Surgeon: Rivet Claudis RAYMOND, MD;  [facility];  Service: Endoscopy;;   Prostage cancer     PROSTATE BIOPSY  ROBOT ASSISTED LAPAROSCOPIC RADICAL PROSTATECTOMY Bilateral 01/19/2015   Procedure: ROBOTIC ASSISTED LAPAROSCOPIC RADICAL PROSTATECTOMY AND BILATERAL PELVIC LYMPH NODE DISSECTION, LAPARASCOPIC  LYSIS OF ADHESIONS;  Surgeon: Morene LELON Salines, MD;  Location: [facility];  Service: Urology;  Laterality: Bilateral;   TRANSURETHRAL RESECTION OF PROSTATE  07/11/2011   Procedure: TRANSURETHRAL RESECTION OF THE PROSTATE (TURP);  Surgeon: Mohammad I Javaid, MD;  Location: [facility];  Service: Urology;  Laterality: N/A;   Patient Active Problem List   Diagnosis Date Noted   Left thyroid  nodule 01/13/2019   Non-recurrent unilateral inguinal hernia without  obstruction or gangrene    GERD (gastroesophageal reflux disease) 01/26/2015   TIA (transient ischemic attack)    Transient global amnesia 01/25/2015   Prostate cancer (HCC) 08/29/2014   Kidney stone on left side 08/02/2013   Diarrhea 07/05/2013   Short-segment Barrett's esophagus 02/09/2012   Calculus, bladder 07/04/2011   Tooth infection 07/03/2011   Adenocarcinoma of colon (HCC) 07/03/2011   Erosive gastritis 07/03/2011   Microcytic hypochromic anemia 07/02/2011   Hx of colonic polyp 07/02/2011   Hyperglycemia 07/02/2011   BPH (benign prostatic hyperplasia) 07/02/2011    PCP: Shona Norleen PEDLAR, MD   REFERRING PROVIDER: Shona Norleen PEDLAR, MD   REFERRING DIAG:  R29.6 (ICD-10-CM) - Multiple falls  R26.9 (ICD-10-CM) - Gait abnormality    Rationale for Evaluation and Treatment:  Rehabiliation  THERAPY DIAG:  Other abnormalities of gait and mobility  Muscle weakness (generalized)  Repeated falls  ONSET DATE: Fall 2 weeks ago from 12/10/23   SUBJECTIVE:                                                                                                                                                                                           SUBJECTIVE STATEMENT: Pt reports no new complaints.   PERTINENT HISTORY:  See above PMH  PAIN:  Denies pain   PRECAUTIONS: ,  Fall  RED FLAGS: None   WEIGHT BEARING RESTRICTIONS:  No  FALLS:  Has patient fallen in last 6 months? Yes. 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  DIAGNOSTIC FINDINGS:  Head CT 2024 IMPRESSION: No acute intracranial abnormality  PATIENT SURVEYS:  Patient-Specific Activity Scoring Scheme  0 represents "unable to perform." 10 represents "able to perform at prior level. 0 1 2 3 4 5 6 7 8 9  10 (Date  and Score)   Activity Eval  01/27/24   1. Getting up  from chair 4   9.5  2. walking 4   7.5              Score 4/10 8.5/10   Total score = sum of the activity scores/number of activities Minimum detectable change (90%CI) for average score = 2 points Minimum detectable change (90%CI) for single activity score = 3 points   GAIT: Eval: Distance walked: 25 feet Assistive device utilized: None, but does use RW at home sometimes Level of assistance: SBA Comments: slow speed shuffling gait.     UPPER EXTREMITY MMT: Grossly 4+/5 MMT bilat     LOWER EXTREMITY MMT:   Grossly 4/5 MMT bilat   FUNCTIONAL TESTS:  Eval: 5 times sit to stand: 28 seconds Timed up and go (TUG): 24.09 3 minute walk test: 190 feet, HR 61, SPO2 96%  Berg Balance Scale:  Item Test date: 12/10/23 Test date: 01/14/24 Test date:   Sitting to standing 4. able to stand without using hands and stabilize independently 4. able to stand without using hands and stabilize independently  4. able to stand safely for 2 minutes  4. able to sit safely and securely for 2 minutes  4. sits safely with minimal use of hands  4. able to transfer safely with minor use of hands  3. able to stand 10 seconds with supervision  3. able to place feet together independently and stand 1 minute with supervision  4. can reach forward confidently 25 cm (10 inches)  3. able to pick up slipper but needs supervision  4. looks behind from both sides and weight shifts well   2. able to turn 360 degrees safely but slowly  1. able to complete > 2 steps needs minimal assist   0. loses balance while stepping or standing  0. unable to try of needs assist to prevent fall       2. Standing unsupported 4. able to stand safely for 2 minutes    3. Sitting with back unsupported, feet supported 4. able to sit safely and securely for 2 minutes    4. Standing to sitting 3. controls descent by using hands    5. Pivot transfer  4. able to transfer  safely with minor use of hands    6. Standing unsupported with eyes closed 3. able to stand 10 seconds with supervision    7. Standing unsupported with feet together 3. able to place feet together independently and stand 1 minute with supervision    8. Reaching forward with outstretched arms while standing 4. can reach forward confidently 25 cm (10 inches)    9. Pick up object from the floor from standing 3. able to pick up slipper but needs supervision    10. Turning to look behind over left and right shoulders while standing 2. turns sideways but only maintains balance    11. Turn 360 degrees 2. able to turn 360 degrees safely but slowly    12. Place alternate foot on step or stool while standing unsupported 1. able to complete > 2 steps needs minimal assist    13. Standing unsupported one foot in front 0. loses balance while stepping or standing    14. Standing on one leg 0. unable to try of needs assist to prevent fall      Total Score 37/56 Total Score 40/56 Total Score /56     12/28/23 Functional tests 5 times sit to stand: 21:45 seconds Timed up and go (TUG): 17.96 seconds 3 minute  walk test: 300 feet, HR 86, SPO2 95%  01/14/24 Functional tests 5 times sit to stand: 20.62 seconds Timed up and go (TUG): 22.51  seconds 3 minute walk test: 275 feet, HR 86, SPO2 95%                                                                                                                             TREATMENT DATE:  01/27/24 Nustep L6 x 5 min UEs/LEs In // bars standing:  PWR!up 2x10 with bilat UE support on bars  PWR!rock 2x10 with intermittent UE support on bars, cues to lift heel up  PWR!twist 2x10 cues to keep arms apart and pivot on toe  PWR!step 2x10 pt side stepping over theraband on floor for visual cueing, intermittent UE support PWR!step x10 pt stepping forward and then back over theraband on floor for visual cueing, intermittent UE support Amb 2x50' no a/d, cues for heel strike and  bigger steps (tends to take smaller step with L)    01/18/24 Therex Nu step L1 X 15 min UE/LE seat #15 for endurance  Neuromuscular re-ed Walking march over obstacles  8 round trips  March walking 10 feet X 6 Backward walking 10 feet X 6 Sidestepping 10 feet X 6  [employer] Leg press 45# 2X15 Leg extension machine 30# 2X15 Sit to stand no UE support, power up, then slow lower X 10 reps Walking throughout clinic 200 feet with cues for bigger steps, using [facility]  01/14/24 Therex Nu step L1 X 15 min UE/LE seat #15 for endurance  [employer] Functional retesting, see above for details Leg press 40# 2X15 Leg extension machine 25# 2X15  Neuro re-Ed BERG balance   PATIENT EDUCATION: Education details: HEP, PT plan of care, selfcare Person educated: Patient Education method: Explanation, Demonstration, Verbal cues, and Handouts Education comprehension: verbalized understanding, further education recommended   HOME EXERCISE PROGRAM: Access Code: [email or web address] URL: [email or web address] Date: 12/10/2023 Prepared by: Redell Moose  Exercises - Sit to Stand - Increased Speed  - 2 x daily - 6 x weekly - 1 sets - 10 reps - Heel Toe Raises with Counter Support  - 2 x daily - 6 x weekly - 2 sets - 10 reps - Walking March  - 2 x daily - 6 x weekly - 3 sets - 10 reps - Standing Tandem Balance with Counter Support  - 2 x daily - 6 x weekly - 1 sets - 3 reps - 30 sec hold - Backward Walking with Counter Support  - 2 x daily - 6 x weekly - 3 sets - 10 reps  ASSESSMENT:  CLINICAL IMPRESSION: Treatment focused on improving weight shifting, strength, endurance and step clearance. Requires multiple sitting breaks due to fatigue. Still requires cueing to decrease shuffling. Pt with improved perceived function as noted with increase in his PSFS. Pt is still considered a high fall risk based on his Berg. Pt will continue to benefit from PT to meet  his goals and improve his safety.    OBJECTIVE IMPAIRMENTS: decreased activity tolerance for ADL's, difficulty walking, decreased balance, decreased endurance, decreased mobility, decreased ROM, decreased strength, impaired flexibility, and pain.  ACTIVITY LIMITATIONS: bending, lifting, carry, locomotion, PERSONAL FACTORS: see above PMH  also affecting patient's functional outcome.  REHAB POTENTIAL: Good  CLINICAL DECISION MAKING: Evolving/moderate complexity  EVALUATION COMPLEXITY: Moderate    GOALS: Short term PT Goals Target date: 01/07/2024   Pt will be I and compliant with HEP. Baseline: no HEP Goal status: ongoing 8/06/06/23, some compliance but not full compliance Pt will improve 5 times sit to stand test to less than 23 seconds to show improved leg strength and balance Baseline:28 seconds Goal status: MET 12/28/23  Long term PT goals Target date: 03/09/2024   Pt will improve Timed up and go (TUG) time to less than 18 seconds and also 5 times sit to stand test to less than 18 seconds to show improved gait speed and balance Baseline:24 sec Goal status: MET 12/28/23 Pt will improve  strength to at least 4+/5 MMT to improve functional strength Baseline:4/5 Goal status: ongoing 01/14/24 Pt will improve Patient specific functional scale (PSFS) to at least 7 /10 to show improved function level Baseline:4/10 Goal status: MET 01/27/24: 8.5/10 Pt will improve 3 minute walk test to >250 feet to improve community access Baseline:190 feet Goal status: MET 12/28/23 Pt will improve BERG balance test to >45/56 to show improved balance Baseline:37/56 Goal status: ongoing 01/14/24  PLAN: PT FREQUENCY: 1-3 times per week   PT DURATION: 4-6 weeks  PLANNED INTERVENTIONS (unless contraindicated):  97110-Therapeutic exercises, 97530- Therapeutic activity, 97112- Neuromuscular re-education, 97535- Self Care, [id number]- Manual therapy, [id number]- Gait training, and Balance training  PLAN FOR NEXT SESSION: use cane more,  work on  gait, balance, leg strength, big movements.  NEXT MD VISIT: PRN    Kalyssa Anker April Ma L Charod Slawinski, PT,DPT 01/27/2024, 11:56 AM

## 2024-01-27 NOTE — Addendum Note (Signed)
 Addended by: JENNETTE CHRISTENE IZETTA EARNIE L on: 01/27/2024 02:37 PM   Modules accepted: Orders

## 2024-01-29 ENCOUNTER — Ambulatory Visit: Admitting: Physical Therapy

## 2024-01-29 ENCOUNTER — Encounter: Payer: Self-pay | Admitting: Physical Therapy

## 2024-01-29 DIAGNOSIS — M6281 Muscle weakness (generalized): Secondary | ICD-10-CM

## 2024-01-29 DIAGNOSIS — R2689 Other abnormalities of gait and mobility: Secondary | ICD-10-CM

## 2024-01-29 DIAGNOSIS — Z8546 Personal history of malignant neoplasm of prostate: Secondary | ICD-10-CM | POA: Diagnosis not present

## 2024-01-29 DIAGNOSIS — E782 Mixed hyperlipidemia: Secondary | ICD-10-CM | POA: Diagnosis not present

## 2024-01-29 DIAGNOSIS — E538 Deficiency of other specified B group vitamins: Secondary | ICD-10-CM | POA: Diagnosis not present

## 2024-01-29 DIAGNOSIS — R296 Repeated falls: Secondary | ICD-10-CM | POA: Diagnosis not present

## 2024-01-29 DIAGNOSIS — G40909 Epilepsy, unspecified, not intractable, without status epilepticus: Secondary | ICD-10-CM | POA: Diagnosis not present

## 2024-01-29 DIAGNOSIS — R7303 Prediabetes: Secondary | ICD-10-CM | POA: Diagnosis not present

## 2024-01-29 NOTE — Therapy (Signed)
 OUTPATIENT PHYSICAL THERAPY TREATMENT    Patient Name: William Burns MRN: [id number] DOB:08-27-1945, 78 y.o., male Today's Date: 01/29/2024  END OF SESSION:  PT End of Session - 01/29/24 1105     Visit Number 13    Number of Visits 20    Date for PT Re-Evaluation 03/09/24    Authorization Type MCR    Progress Note Due on Visit 20    PT Start Time 1103    PT Stop Time 1145    PT Time Calculation (min) 42 min    Activity Tolerance Patient tolerated treatment well    Behavior During Therapy WFL for tasks assessed/performed             Past Medical History:  Diagnosis Date   BPH (benign prostatic hyperplasia)    Cancer (HCC)    colon   Cataract    Colonic mass 07/03/2011   Difficult intubation    extra long tube for intub per wife; states his neck is longer than regular people and slow to wake up    Erosive gastritis 07/03/2011   GERD (gastroesophageal reflux disease)    History of kidney stones    Kidney stones 2011   Renal calculus    Tooth infection 07/03/2011   Past Surgical History:  Procedure Laterality Date   CATARACT EXTRACTION W/PHACO Left 08/23/2012   Procedure: CATARACT EXTRACTION PHACO AND INTRAOCULAR LENS PLACEMENT (IOC);  Surgeon: Cherene Mania, MD;  Location: [facility];  Service: Ophthalmology;  Laterality: Left;  CDE 16.64   CATARACT EXTRACTION W/PHACO Right 06/09/2013   Procedure: CATARACT EXTRACTION PHACO AND INTRAOCULAR LENS PLACEMENT (IOC) CDE=10.86;  Surgeon: Cherene Mania, MD;  Location: [facility];  Service: Ophthalmology;  Laterality: Right;   COLON SURGERY     COLONOSCOPY  07/03/2011   Procedure: COLONOSCOPY;  Surgeon: Claudis RAYMOND Rivet, MD;  Location: [facility];  Service: Endoscopy;  Laterality: N/A;   COLONOSCOPY N/A 09/16/2012   Procedure: COLONOSCOPY;  Surgeon: Claudis RAYMOND Rivet, MD;  [facility]: [facility];  Service: Endoscopy;  Laterality: N/A;  930   COLONOSCOPY N/A 12/05/2015   Procedure: COLONOSCOPY;  Surgeon: Claudis RAYMOND Rivet, MD;  [facility]: [facility];  Service: Endoscopy;  Laterality: N/A;  1200   COLONOSCOPY WITH PROPOFOL  N/A 02/06/2021   Procedure: COLONOSCOPY WITH PROPOFOL ;  Surgeon: Rivet Claudis RAYMOND, MD;  [facility]: [facility];  Service: Endoscopy;  Laterality: N/A;  12:50   CYSTOSCOPY  07/09/2011   Procedure: CYSTOSCOPY FLEXIBLE;  Surgeon: Emery LILLETTE Blaze, MD;  Location: [facility];  Service: Urology;  Laterality: N/A;   CYSTOSCOPY WITH LITHOLAPAXY  07/11/2011   Procedure: CYSTOSCOPY WITH LITHOLAPAXY;  Surgeon: Emery LILLETTE Blaze, MD;  Location: [facility];  Service: Urology;  Laterality: N/A;   ESOPHAGOGASTRODUODENOSCOPY  07/03/2011   Procedure: ESOPHAGOGASTRODUODENOSCOPY (EGD);  Surgeon: Claudis RAYMOND Rivet, MD;  [facility];  Service: Endoscopy;  Laterality: N/A;   HERNIA REPAIR  2008    umbilical    INGUINAL HERNIA REPAIR Right 05/04/2017   Procedure: HERNIA REPAIR INGUINAL ADULT WITH MESH;  Surgeon: Mavis Anes, MD;  Location: [facility];  Service: General;  Laterality: Right;   PARTIAL COLECTOMY  07/04/2011   Procedure: PARTIAL COLECTOMY;  Surgeon: Anes DELENA Mavis, MD;  Location: [facility];  Service: General;  Laterality: N/A;   POLYPECTOMY  02/06/2021   Procedure: POLYPECTOMY;  Surgeon: Rivet Claudis RAYMOND, MD;  [facility];  Service: Endoscopy;;   Prostage cancer     PROSTATE BIOPSY  ROBOT ASSISTED LAPAROSCOPIC RADICAL PROSTATECTOMY Bilateral 01/19/2015   Procedure: ROBOTIC ASSISTED LAPAROSCOPIC RADICAL PROSTATECTOMY AND BILATERAL PELVIC LYMPH NODE DISSECTION, LAPARASCOPIC  LYSIS OF ADHESIONS;  Surgeon: Morene LELON Salines, MD;  Location: [facility];  Service: Urology;  Laterality: Bilateral;   TRANSURETHRAL RESECTION OF PROSTATE  07/11/2011   Procedure: TRANSURETHRAL RESECTION OF THE PROSTATE (TURP);  Surgeon: Mohammad I Javaid, MD;  Location: [facility];  Service: Urology;  Laterality: N/A;   Patient Active Problem List   Diagnosis Date Noted   Left thyroid  nodule 01/13/2019   Non-recurrent unilateral inguinal hernia without  obstruction or gangrene    GERD (gastroesophageal reflux disease) 01/26/2015   TIA (transient ischemic attack)    Transient global amnesia 01/25/2015   Prostate cancer (HCC) 08/29/2014   Kidney stone on left side 08/02/2013   Diarrhea 07/05/2013   Short-segment Barrett's esophagus 02/09/2012   Calculus, bladder 07/04/2011   Tooth infection 07/03/2011   Adenocarcinoma of colon (HCC) 07/03/2011   Erosive gastritis 07/03/2011   Microcytic hypochromic anemia 07/02/2011   Hx of colonic polyp 07/02/2011   Hyperglycemia 07/02/2011   BPH (benign prostatic hyperplasia) 07/02/2011    PCP: Shona Norleen PEDLAR, MD   REFERRING PROVIDER: Shona Norleen PEDLAR, MD   REFERRING DIAG:  R29.6 (ICD-10-CM) - Multiple falls  R26.9 (ICD-10-CM) - Gait abnormality    Rationale for Evaluation and Treatment:  Rehabiliation  THERAPY DIAG:  Other abnormalities of gait and mobility  Muscle weakness (generalized)  Repeated falls  ONSET DATE: Fall 2 weeks ago from 12/10/23   SUBJECTIVE:                                                                                                                                                                                           SUBJECTIVE STATEMENT: Pt reports no new complaints.   PERTINENT HISTORY:  See above PMH  PAIN:  Denies pain   PRECAUTIONS: ,  Fall  RED FLAGS: None   WEIGHT BEARING RESTRICTIONS:  No  FALLS:  Has patient fallen in last 6 months? Yes. 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  DIAGNOSTIC FINDINGS:  Head CT 2024 IMPRESSION: No acute intracranial abnormality  PATIENT SURVEYS:  Patient-Specific Activity Scoring Scheme  0 represents "unable to perform." 10 represents "able to perform at prior level. 0 1 2 3 4 5 6 7 8 9  10 (Date  and Score)   Activity Eval  01/27/24   1. Getting up  from chair 4   9.5  2. walking 4   7.5              Score 4/10 8.5/10   Total score = sum of the activity scores/number of activities Minimum detectable change (90%CI) for average score = 2 points Minimum detectable change (90%CI) for single activity score = 3 points   GAIT: Eval: Distance walked: 25 feet Assistive device utilized: None, but does use RW at home sometimes Level of assistance: SBA Comments: slow speed shuffling gait.     UPPER EXTREMITY MMT: Grossly 4+/5 MMT bilat     LOWER EXTREMITY MMT:   Grossly 4/5 MMT bilat   FUNCTIONAL TESTS:  Eval: 5 times sit to stand: 28 seconds Timed up and go (TUG): 24.09 3 minute walk test: 190 feet, HR 61, SPO2 96%  Berg Balance Scale:  Item Test date: 12/10/23 Test date: 01/14/24 Test date:   Sitting to standing 4. able to stand without using hands and stabilize independently 4. able to stand without using hands and stabilize independently  4. able to stand safely for 2 minutes  4. able to sit safely and securely for 2 minutes  4. sits safely with minimal use of hands  4. able to transfer safely with minor use of hands  3. able to stand 10 seconds with supervision  3. able to place feet together independently and stand 1 minute with supervision  4. can reach forward confidently 25 cm (10 inches)  3. able to pick up slipper but needs supervision  4. looks behind from both sides and weight shifts well   2. able to turn 360 degrees safely but slowly  1. able to complete > 2 steps needs minimal assist   0. loses balance while stepping or standing  0. unable to try of needs assist to prevent fall       2. Standing unsupported 4. able to stand safely for 2 minutes    3. Sitting with back unsupported, feet supported 4. able to sit safely and securely for 2 minutes    4. Standing to sitting 3. controls descent by using hands    5. Pivot transfer  4. able to transfer  safely with minor use of hands    6. Standing unsupported with eyes closed 3. able to stand 10 seconds with supervision    7. Standing unsupported with feet together 3. able to place feet together independently and stand 1 minute with supervision    8. Reaching forward with outstretched arms while standing 4. can reach forward confidently 25 cm (10 inches)    9. Pick up object from the floor from standing 3. able to pick up slipper but needs supervision    10. Turning to look behind over left and right shoulders while standing 2. turns sideways but only maintains balance    11. Turn 360 degrees 2. able to turn 360 degrees safely but slowly    12. Place alternate foot on step or stool while standing unsupported 1. able to complete > 2 steps needs minimal assist    13. Standing unsupported one foot in front 0. loses balance while stepping or standing    14. Standing on one leg 0. unable to try of needs assist to prevent fall      Total Score 37/56 Total Score 40/56 Total Score /56     12/28/23 Functional tests 5 times sit to stand: 21:45 seconds Timed up and go (TUG): 17.96 seconds 3 minute  walk test: 300 feet, HR 86, SPO2 95%  01/14/24 Functional tests 5 times sit to stand: 20.62 seconds Timed up and go (TUG): 22.51  seconds 3 minute walk test: 275 feet, HR 86, SPO2 95%                                                                                                                             TREATMENT DATE:  01/29/24 Nustep L3 x 8 min UEs/LEs maintaining >70 SPM In // bar standing ACTIVATE and PREPARE each:  PWR!up x10 with bilat UE support on bars  PWR!rock x10 with intermittent UE support on bars, cues to lift heel up  PWR!twist x10 cues to keep arms apart and pivot on toe  PWR!step x10 pt stepping forward and then back over theraband on floor for visual cueing, intermittent UE support  Combined movements x10 Amb 2x50' with PT assist for opposing arm and leg movement and cueing using  canes  Lunge step walking with alternating UEs 2x50' with PT cues Amb 2x50' big steps with reciprocal pattern   01/27/24 Nustep L6 x 5 min UEs/LEs In // bars standing:  PWR!up 2x10 with bilat UE support on bars  PWR!rock 2x10 with intermittent UE support on bars, cues to lift heel up  PWR!twist 2x10 cues to keep arms apart and pivot on toe  PWR!step 2x10 pt side stepping over theraband on floor for visual cueing, intermittent UE support PWR!step x10 pt stepping forward and then back over theraband on floor for visual cueing, intermittent UE support Amb 2x50' no a/d, cues for heel strike and bigger steps (tends to take smaller step with L)    01/18/24 Therex Nu step L1 X 15 min UE/LE seat #15 for endurance  Neuromuscular re-ed Walking march over obstacles  8 round trips  March walking 10 feet X 6 Backward walking 10 feet X 6 Sidestepping 10 feet X 6  Theractivity Leg press 45# 2X15 Leg extension machine 30# 2X15 Sit to stand no UE support, power up, then slow lower X 10 reps Walking throughout clinic 200 feet with cues for bigger steps, using [facility]  01/14/24 Therex Nu step L1 X 15 min UE/LE seat #15 for endurance  [employer]ivity Functional retesting, see above for details Leg press 40# 2X15 Leg extension machine 25# 2X15  Neuro re-Ed BERG balance   PATIENT EDUCATION: Education details: HEP, PT plan of care, selfcare Person educated: Patient Education method: Explanation, Demonstration, Verbal cues, and Handouts Education comprehension: verbalized understanding, further education recommended   HOME EXERCISE PROGRAM: Access Code: [email or web address] URL: [email or web address] Date: 12/10/2023 Prepared by: Redell Moose  Exercises - Sit to Stand - Increased Speed  - 2 x daily - 6 x weekly - 1 sets - 10 reps - Heel Toe Raises with Counter Support  - 2 x daily - 6 x weekly - 2 sets - 10 reps - Walking March  - 2 x daily - 6 x weekly - 3 sets -  10 reps - Standing  Tandem Balance with Counter Support  - 2 x daily - 6 x weekly - 1 sets - 3 reps - 30 sec hold - Backward Walking with Counter Support  - 2 x daily - 6 x weekly - 3 sets - 10 reps  ASSESSMENT:  CLINICAL IMPRESSION: Continuing to work on improving pt's endurance and bigger arm/leg movements. Difficulty swinging L UE and coordinating L LE when performing alternating patterns during gait. Reviewed standing PWR! Moves and performed in sequence today. Pt continues to benefit from PT to improve his balance, gait and coordination.   OBJECTIVE IMPAIRMENTS: decreased activity tolerance for ADL's, difficulty walking, decreased balance, decreased endurance, decreased mobility, decreased ROM, decreased strength, impaired flexibility, and pain.  ACTIVITY LIMITATIONS: bending, lifting, carry, locomotion, PERSONAL FACTORS: see above PMH  also affecting patient's functional outcome.  REHAB POTENTIAL: Good  CLINICAL DECISION MAKING: Evolving/moderate complexity  EVALUATION COMPLEXITY: Moderate    GOALS: Short term PT Goals Target date: 01/07/2024   Pt will be I and compliant with HEP. Baseline: no HEP Goal status: ongoing 8/06/06/23, some compliance but not full compliance Pt will improve 5 times sit to stand test to less than 23 seconds to show improved leg strength and balance Baseline:28 seconds Goal status: MET 12/28/23  Long term PT goals Target date: 03/09/2024   Pt will improve Timed up and go (TUG) time to less than 18 seconds and also 5 times sit to stand test to less than 18 seconds to show improved gait speed and balance Baseline:24 sec Goal status: MET 12/28/23 Pt will improve  strength to at least 4+/5 MMT to improve functional strength Baseline:4/5 Goal status: ongoing 01/14/24 Pt will improve Patient specific functional scale (PSFS) to at least 7 /10 to show improved function level Baseline:4/10 Goal status: MET 01/27/24: 8.5/10 Pt will improve 3 minute walk test to >250 feet to  improve community access Baseline:190 feet Goal status: MET 12/28/23 Pt will improve BERG balance test to >45/56 to show improved balance Baseline:37/56 Goal status: ongoing 01/14/24  PLAN: PT FREQUENCY: 1-3 times per week   PT DURATION: 4-6 weeks  PLANNED INTERVENTIONS (unless contraindicated):  97110-Therapeutic exercises, 97530- Therapeutic activity, 97112- Neuromuscular re-education, 97535- Self Care, 02859- Manual therapy, 97116- Gait training, and Balance training  PLAN FOR NEXT SESSION: use cane more,  work on gait, balance, leg strength, big movements.  NEXT MD VISIT: PRN    Lenoir Facchini April Ma L Adanely Reynoso, PT,DPT 01/29/2024, 11:38 AM

## 2024-02-05 DIAGNOSIS — R4189 Other symptoms and signs involving cognitive functions and awareness: Secondary | ICD-10-CM | POA: Diagnosis not present

## 2024-02-05 DIAGNOSIS — K219 Gastro-esophageal reflux disease without esophagitis: Secondary | ICD-10-CM | POA: Diagnosis not present

## 2024-02-05 DIAGNOSIS — H8113 Benign paroxysmal vertigo, bilateral: Secondary | ICD-10-CM | POA: Diagnosis not present

## 2024-02-05 DIAGNOSIS — H9193 Unspecified hearing loss, bilateral: Secondary | ICD-10-CM | POA: Diagnosis not present

## 2024-02-05 DIAGNOSIS — E538 Deficiency of other specified B group vitamins: Secondary | ICD-10-CM | POA: Diagnosis not present

## 2024-02-05 DIAGNOSIS — R269 Unspecified abnormalities of gait and mobility: Secondary | ICD-10-CM | POA: Diagnosis not present

## 2024-02-05 DIAGNOSIS — E782 Mixed hyperlipidemia: Secondary | ICD-10-CM | POA: Diagnosis not present

## 2024-02-05 DIAGNOSIS — R7303 Prediabetes: Secondary | ICD-10-CM | POA: Diagnosis not present

## 2024-02-05 DIAGNOSIS — Z23 Encounter for immunization: Secondary | ICD-10-CM | POA: Diagnosis not present

## 2024-02-05 DIAGNOSIS — N1831 Chronic kidney disease, stage 3a: Secondary | ICD-10-CM | POA: Diagnosis not present

## 2024-02-05 DIAGNOSIS — M542 Cervicalgia: Secondary | ICD-10-CM | POA: Diagnosis not present

## 2024-02-05 DIAGNOSIS — G40909 Epilepsy, unspecified, not intractable, without status epilepticus: Secondary | ICD-10-CM | POA: Diagnosis not present

## 2024-02-10 ENCOUNTER — Encounter (HOSPITAL_COMMUNITY): Payer: Self-pay

## 2024-02-10 ENCOUNTER — Ambulatory Visit (HOSPITAL_COMMUNITY)
Admission: RE | Admit: 2024-02-10 | Discharge: 2024-02-10 | Disposition: A | Discharge status: Home / Self Care | Source: Ambulatory Visit | Attending: Neurology | Admitting: Neurology

## 2024-02-10 DIAGNOSIS — R269 Unspecified abnormalities of gait and mobility: Secondary | ICD-10-CM | POA: Insufficient documentation

## 2024-02-10 DIAGNOSIS — G20C Parkinsonism, unspecified: Secondary | ICD-10-CM | POA: Diagnosis not present

## 2024-02-10 MED ORDER — POTASSIUM IODIDE (ANTIDOTE) 130 MG PO TABS
ORAL_TABLET | ORAL | Status: AC
  Filled 2024-02-10: qty 1

## 2024-02-10 MED ORDER — IOFLUPANE I 123 185 MBQ/2.5ML IV SOLN
4.3500 | Freq: Once | INTRAVENOUS | Status: AC
  Administered 2024-02-10: 4.35 via INTRAVENOUS
  Filled 2024-02-10: qty 5

## 2024-02-15 ENCOUNTER — Ambulatory Visit: Payer: Self-pay | Admitting: Neurology

## 2024-03-16 ENCOUNTER — Encounter (INDEPENDENT_AMBULATORY_CARE_PROVIDER_SITE_OTHER): Payer: Self-pay | Admitting: Gastroenterology

## 2024-03-16 DIAGNOSIS — Z85828 Personal history of other malignant neoplasm of skin: Secondary | ICD-10-CM | POA: Diagnosis not present

## 2024-03-16 DIAGNOSIS — L570 Actinic keratosis: Secondary | ICD-10-CM | POA: Diagnosis not present

## 2024-03-16 DIAGNOSIS — X32XXXD Exposure to sunlight, subsequent encounter: Secondary | ICD-10-CM | POA: Diagnosis not present

## 2024-03-16 DIAGNOSIS — Z08 Encounter for follow-up examination after completed treatment for malignant neoplasm: Secondary | ICD-10-CM | POA: Diagnosis not present

## 2024-04-13 ENCOUNTER — Encounter: Payer: Self-pay | Admitting: Neurology

## 2024-04-15 ENCOUNTER — Other Ambulatory Visit: Payer: Self-pay | Admitting: Neurology

## 2024-04-15 DIAGNOSIS — G3184 Mild cognitive impairment, so stated: Secondary | ICD-10-CM

## 2024-04-17 ENCOUNTER — Other Ambulatory Visit: Payer: Self-pay

## 2024-04-17 ENCOUNTER — Emergency Department (HOSPITAL_COMMUNITY)

## 2024-04-17 ENCOUNTER — Encounter (HOSPITAL_COMMUNITY): Payer: Self-pay | Admitting: Emergency Medicine

## 2024-04-17 ENCOUNTER — Inpatient Hospital Stay (HOSPITAL_COMMUNITY)
Admission: EM | Admit: 2024-04-17 | Discharge: 2024-04-19 | DRG: 640 | Disposition: A | Discharge status: Home Health | Attending: Emergency Medicine | Admitting: Emergency Medicine

## 2024-04-17 DIAGNOSIS — Z8546 Personal history of malignant neoplasm of prostate: Secondary | ICD-10-CM

## 2024-04-17 DIAGNOSIS — Z961 Presence of intraocular lens: Secondary | ICD-10-CM | POA: Diagnosis present

## 2024-04-17 DIAGNOSIS — Z7982 Long term (current) use of aspirin: Secondary | ICD-10-CM

## 2024-04-17 DIAGNOSIS — Z87442 Personal history of urinary calculi: Secondary | ICD-10-CM

## 2024-04-17 DIAGNOSIS — R9431 Abnormal electrocardiogram [ECG] [EKG]: Secondary | ICD-10-CM | POA: Diagnosis not present

## 2024-04-17 DIAGNOSIS — I6782 Cerebral ischemia: Secondary | ICD-10-CM | POA: Diagnosis not present

## 2024-04-17 DIAGNOSIS — Z85038 Personal history of other malignant neoplasm of large intestine: Secondary | ICD-10-CM

## 2024-04-17 DIAGNOSIS — Z87891 Personal history of nicotine dependence: Secondary | ICD-10-CM

## 2024-04-17 DIAGNOSIS — S199XXA Unspecified injury of neck, initial encounter: Secondary | ICD-10-CM | POA: Diagnosis not present

## 2024-04-17 DIAGNOSIS — Z1152 Encounter for screening for COVID-19: Secondary | ICD-10-CM

## 2024-04-17 DIAGNOSIS — G9340 Encephalopathy, unspecified: Secondary | ICD-10-CM | POA: Diagnosis not present

## 2024-04-17 DIAGNOSIS — G9341 Metabolic encephalopathy: Secondary | ICD-10-CM | POA: Diagnosis not present

## 2024-04-17 DIAGNOSIS — Z9842 Cataract extraction status, left eye: Secondary | ICD-10-CM

## 2024-04-17 DIAGNOSIS — K219 Gastro-esophageal reflux disease without esophagitis: Secondary | ICD-10-CM | POA: Diagnosis present

## 2024-04-17 DIAGNOSIS — N400 Enlarged prostate without lower urinary tract symptoms: Secondary | ICD-10-CM | POA: Diagnosis present

## 2024-04-17 DIAGNOSIS — G40909 Epilepsy, unspecified, not intractable, without status epilepticus: Secondary | ICD-10-CM | POA: Diagnosis present

## 2024-04-17 DIAGNOSIS — M4802 Spinal stenosis, cervical region: Secondary | ICD-10-CM | POA: Diagnosis not present

## 2024-04-17 DIAGNOSIS — Z79899 Other long term (current) drug therapy: Secondary | ICD-10-CM

## 2024-04-17 DIAGNOSIS — Z9841 Cataract extraction status, right eye: Secondary | ICD-10-CM

## 2024-04-17 DIAGNOSIS — N179 Acute kidney failure, unspecified: Secondary | ICD-10-CM | POA: Diagnosis present

## 2024-04-17 DIAGNOSIS — Z8719 Personal history of other diseases of the digestive system: Secondary | ICD-10-CM

## 2024-04-17 DIAGNOSIS — M47812 Spondylosis without myelopathy or radiculopathy, cervical region: Secondary | ICD-10-CM | POA: Diagnosis not present

## 2024-04-17 DIAGNOSIS — E860 Dehydration: Principal | ICD-10-CM | POA: Diagnosis present

## 2024-04-17 DIAGNOSIS — Z9079 Acquired absence of other genital organ(s): Secondary | ICD-10-CM

## 2024-04-17 DIAGNOSIS — R4182 Altered mental status, unspecified: Secondary | ICD-10-CM | POA: Diagnosis not present

## 2024-04-17 DIAGNOSIS — F0390 Unspecified dementia without behavioral disturbance: Secondary | ICD-10-CM | POA: Diagnosis present

## 2024-04-17 LAB — COMPREHENSIVE METABOLIC PANEL WITH GFR
ALT: 31 U/L (ref 0–44)
AST: 37 U/L (ref 15–41)
Albumin: 3.9 g/dL (ref 3.5–5.0)
Alkaline Phosphatase: 97 U/L (ref 38–126)
Anion gap: 10 (ref 5–15)
BUN: 24 mg/dL — ABNORMAL HIGH (ref 8–23)
CO2: 30 mmol/L (ref 22–32)
Calcium: 9.1 mg/dL (ref 8.9–10.3)
Chloride: 102 mmol/L (ref 98–111)
Creatinine, Ser: 1.48 mg/dL — ABNORMAL HIGH (ref 0.61–1.24)
GFR, Estimated: 48 mL/min — ABNORMAL LOW (ref >60)
Glucose, Bld: 79 mg/dL (ref 70–99)
Potassium: 4.4 mmol/L (ref 3.5–5.1)
Sodium: 142 mmol/L (ref 135–145)
Total Bilirubin: 0.4 mg/dL (ref 0.0–1.2)
Total Protein: 7.8 g/dL (ref 6.5–8.1)

## 2024-04-17 LAB — URINALYSIS, ROUTINE W REFLEX MICROSCOPIC
Bacteria, UA: NONE SEEN
Bilirubin Urine: NEGATIVE
Glucose, UA: NEGATIVE mg/dL
Hgb urine dipstick: NEGATIVE
Ketones, ur: 5 mg/dL — AB
Leukocytes,Ua: NEGATIVE
Nitrite: NEGATIVE
Protein, ur: 30 mg/dL — AB
Specific Gravity, Urine: 1.021 (ref 1.005–1.030)
pH: 5 (ref 5.0–8.0)

## 2024-04-17 LAB — CBC
HCT: 40.2 % (ref 39.0–52.0)
Hemoglobin: 12.8 g/dL — ABNORMAL LOW (ref 13.0–17.0)
MCH: 29.3 pg (ref 26.0–34.0)
MCHC: 31.8 g/dL (ref 30.0–36.0)
MCV: 92 fL (ref 80.0–100.0)
Platelets: 428 K/uL — ABNORMAL HIGH (ref 150–400)
RBC: 4.37 MIL/uL (ref 4.22–5.81)
RDW: 12.7 % (ref 11.5–15.5)
WBC: 7.3 K/uL (ref 4.0–10.5)
nRBC: 0 % (ref 0.0–0.2)

## 2024-04-17 LAB — RESP PANEL BY RT-PCR (RSV, FLU A&B, COVID)  RVPGX2
Influenza A by PCR: NEGATIVE
Influenza B by PCR: NEGATIVE
Resp Syncytial Virus by PCR: NEGATIVE
SARS Coronavirus 2 by RT PCR: NEGATIVE

## 2024-04-17 LAB — CBG MONITORING, ED: Glucose-Capillary: 72 mg/dL (ref 70–99)

## 2024-04-17 MED ORDER — SODIUM CHLORIDE 0.9 % IV SOLN: INTRAVENOUS | Status: DC

## 2024-04-17 MED ORDER — ENOXAPARIN SODIUM 40 MG/0.4ML IJ SOSY
40.0000 mg | PREFILLED_SYRINGE | INTRAMUSCULAR | Status: DC
  Administered 2024-04-17 – 2024-04-18 (×2): 40 mg via SUBCUTANEOUS
  Filled 2024-04-17 (×2): qty 0.4

## 2024-04-17 MED ORDER — MEMANTINE HCL 10 MG PO TABS
10.0000 mg | ORAL_TABLET | Freq: Two times a day (BID) | ORAL | Status: DC
  Administered 2024-04-17 – 2024-04-19 (×4): 10 mg via ORAL
  Filled 2024-04-17 (×4): qty 1

## 2024-04-17 MED ORDER — PANTOPRAZOLE SODIUM 40 MG PO TBEC
40.0000 mg | DELAYED_RELEASE_TABLET | Freq: Every day | ORAL | Status: DC
  Administered 2024-04-18 – 2024-04-19 (×2): 40 mg via ORAL
  Filled 2024-04-17 (×2): qty 1

## 2024-04-17 MED ORDER — ASPIRIN 81 MG PO TBEC
81.0000 mg | DELAYED_RELEASE_TABLET | Freq: Every day | ORAL | Status: DC
  Administered 2024-04-18 – 2024-04-19 (×2): 81 mg via ORAL
  Filled 2024-04-17 (×2): qty 1

## 2024-04-17 MED ORDER — SODIUM CHLORIDE 0.9 % IV BOLUS
1000.0000 mL | Freq: Once | INTRAVENOUS | Status: AC
  Administered 2024-04-17: 1000 mL via INTRAVENOUS

## 2024-04-17 MED ORDER — DIVALPROEX SODIUM 250 MG PO DR TAB
250.0000 mg | DELAYED_RELEASE_TABLET | Freq: Two times a day (BID) | ORAL | Status: DC
  Administered 2024-04-17 – 2024-04-19 (×4): 250 mg via ORAL
  Filled 2024-04-17 (×4): qty 1

## 2024-04-17 MED ORDER — CHLORHEXIDINE GLUCONATE CLOTH 2 % EX PADS
6.0000 | MEDICATED_PAD | Freq: Every day | CUTANEOUS | Status: DC
  Administered 2024-04-17 – 2024-04-19 (×2): 6 via TOPICAL

## 2024-04-17 NOTE — ED Provider Notes (Signed)
 [place] EMERGENCY DEPARTMENT AT [facility] Provider Note   CSN: [id number] Arrival date & time: 04/17/24  1350     Patient presents with: Altered Mental Status   William Burns is a 78 y.o. male.  He is brought in by his wife who is helping with history.  Level 5 caveat secondary to altered mental status.  He has a history of some dementia and follows with neurology.  Wife has noticed over the last week or 2 that he has been more confused.  Sleeping a lot.  Mostly staying in his recliner.  Is having spells of falling or his legs giving out and needing assistance getting back up.  Walks with a walker.  Not eating or drinking very much.  Little bit of a cough.  No diarrhea.  Sometimes complains of abdominal pain.  No fevers or chills.  Stated to his family that his abdomen felt cold though.  {Add pertinent medical, surgical, social history, OB history to YEP:67052} The history is provided by the patient and the spouse.  Altered Mental Status Presenting symptoms: confusion and lethargy   Severity:  Moderate Duration:  1 week Timing:  Constant Progression:  Worsening Chronicity:  New Associated symptoms: no fever and no vomiting        Prior to Admission medications   Medication Sig Start Date End Date Taking? Authorizing Provider  aspirin  EC 81 MG tablet Take 81 mg by mouth daily. Swallow whole.    [provider]  Cholecalciferol (VITAMIN D3) 125 MCG (5000 UT) TABS Take by mouth in the morning.    [provider]  divalproex  (DEPAKOTE ) 250 MG DR tablet Take 1 tablet (250 mg total) by mouth in the morning and at bedtime. 12/01/23 11/25/24  Camara, Amadou, MD  gabapentin  (NEURONTIN ) 300 MG capsule Take 1 capsule (300 mg total) by mouth 3 (three) times daily. 12/01/23 05/24/25  Gregg Lek, MD  Lactobacillus (ACIDOPHILUS PO) Take 1 capsule by mouth in the morning. Now Pb8 Acidophilus    [provider]  loratadine (CLARITIN) 10 MG tablet Take 10  mg by mouth in the morning.    [provider]  memantine  (NAMENDA ) 10 MG tablet Take 1 tablet (10 mg total) by mouth 2 (two) times daily. 12/01/23   Camara, Amadou, MD  Multiple Vitamin (MULTIVITAMIN WITH MINERALS) TABS tablet Take 1 tablet by mouth in the morning. Centrum Silver for Men 50+    [provider]  Omega-3 Fatty Acids (FISH OIL) 1200 MG CAPS Take 1,200 mg by mouth in the morning.    [provider]  pantoprazole  (PROTONIX ) 40 MG tablet TAKE 1 TABLET BY MOUTH ONCE DAILY. 11/15/18   Setzer, Veva CROME, NP    Allergies: Patient has no known allergies.    Review of Systems  Unable to perform ROS: Mental status change  Constitutional:  Negative for fever.  Gastrointestinal:  Negative for vomiting.  Psychiatric/Behavioral:  Positive for confusion.     Updated Vital Signs BP 124/64   Pulse 73   Temp 98.2 F (36.8 C)   Resp 16   Ht 6' 3 (1.905 m)   Wt 108.9 kg   SpO2 95%   BMI 30.00 kg/m   Physical Exam Vitals and nursing note reviewed.  Constitutional:      General: He is not in acute distress.    Appearance: Normal appearance. He is well-developed.  HENT:     Head: Normocephalic and atraumatic.  Eyes:  Conjunctiva/sclera: Conjunctivae normal.  Cardiovascular:     Rate and Rhythm: Normal rate and regular rhythm.     Heart sounds: No murmur heard. Pulmonary:     Effort: Pulmonary effort is normal. No respiratory distress.     Breath sounds: Normal breath sounds.  Abdominal:     Palpations: Abdomen is soft.     Tenderness: There is no abdominal tenderness. There is no guarding or rebound.  Musculoskeletal:        General: No deformity.     Cervical back: Neck supple.  Skin:    General: Skin is warm and dry.     Capillary Refill: Capillary refill takes less than 2 seconds.  Neurological:     General: No focal deficit present.     Mental Status: He is alert.     Cranial Nerves: No cranial nerve deficit.     Sensory: No sensory  deficit.     Motor: No weakness.     Comments: He is awake and alert.  He knows it Sunday and knows the month is November.  Wife said he thought it was March the last time we asked him.     (all labs ordered are listed, but only abnormal results are displayed) Labs Reviewed  COMPREHENSIVE METABOLIC PANEL WITH GFR - Abnormal; Notable for the following components:      Result Value   BUN 24 (*)    Creatinine, Ser 1.48 (*)    GFR, Estimated 48 (*)    All other components within normal limits  CBC - Abnormal; Notable for the following components:   Hemoglobin 12.8 (*)    Platelets 428 (*)    All other components within normal limits  URINALYSIS, ROUTINE W REFLEX MICROSCOPIC  CBG MONITORING, ED    EKG: EKG Interpretation Date/Time:  Sunday April 17 2024 14:52:41 EST Ventricular Rate:  70 PR Interval:  213 QRS Duration:  109 QT Interval:  396 QTC Calculation: 428 R Axis:   -36  Text Interpretation: Sinus rhythm Borderline prolonged PR interval Left axis deviation Low voltage, precordial leads Probable anteroseptal infarct, old Baseline wander in lead(s) I II aVR aVF V2 V3 V4 V5 V6 Confirmed by Towana Sharper [phone]) on 04/17/2024 2:56:42 PM  Radiology: No results found.  {Document cardiac monitor, telemetry assessment procedure when appropriate:32947} Procedures   Medications Ordered in the ED  sodium chloride  0.9 % bolus 1,000 mL (has no administration in time range)      {Click here for ABCD2, HEART and other calculators REFRESH Note before signing:1}                              Medical Decision Making Amount and/or Complexity of Data Reviewed Labs: ordered. Radiology: ordered.   This patient complains of ***; this involves an extensive number of treatment Options and is a complaint that carries with it a high risk of complications and morbidity. The differential includes ***  I ordered, reviewed and interpreted labs, which included *** I ordered medication  *** and reviewed PMP when indicated. I ordered imaging studies which included *** and I independently    visualized and interpreted imaging which showed *** Additional history obtained from *** Previous records obtained and reviewed *** I consulted *** and discussed lab and imaging findings and discussed disposition.  Cardiac monitoring reviewed, *** Social determinants considered, *** Critical Interventions: ***  After the interventions stated above, I reevaluated the patient and found ***  Admission and further testing considered, ***   {Document critical care time when appropriate  Document review of labs and clinical decision tools ie CHADS2VASC2, etc  Document your independent review of radiology images and any outside records  Document your discussion with family members, caretakers and with consultants  Document social determinants of health affecting pt's care  Document your decision making why or why not admission, treatments were needed:32947:::1}   Final diagnoses:  None    ED Discharge Orders     None

## 2024-04-17 NOTE — ED Triage Notes (Signed)
 Pt has been falling a lot. Decreased in po intake, decline in mobility, and c/o a cold abd at times.

## 2024-04-17 NOTE — ED Notes (Signed)
 Patient transported to CT

## 2024-04-17 NOTE — H&P (Signed)
 History and Physical    Patient: William Burns FMW:[id number] DOB: 1946-05-17 DOA: 04/17/2024 DOS: the patient was seen and examined on 04/17/2024 PCP: Shona Norleen PEDLAR, MD  Patient coming from: Home  Chief Complaint: Generalized weakness Chief Complaint  Patient presents with   Altered Mental Status   HPI: William Burns is a 78 y.o. male with medical history significant of BPH, history of colon cancer, gastritis, GERD, history of kidney stones, history of baseline cognitive decline who follows up with outpatient neurologist Seen after wife realized that patient has been having generalized weakness, frequent falls as well as increased urination within the last few days.  According to the wife patient has not been eating as expected and not drinking enough.  At the time patient was being seen he was able to contribute to history and denied nausea vomiting abdominal pain chest pain or cough  ED course: Upon arrival to emergency room temperature 98.2, respiratory rate 16, pulse 73, BP 124/64 saturating 95% on room air.  Patient was found to have an increase in creatinine level concerning for dehydration.  Given above concerns hospitalist service was therefore contacted to admit patient for further management  Review of Systems: As mentioned in the history of present illness. All other systems reviewed and are negative. Past Medical History:  Diagnosis Date   BPH (benign prostatic hyperplasia)    Cancer (HCC)    colon   Cataract    Colonic mass 07/03/2011   Difficult intubation    extra long tube for intub per wife; states his neck is longer than regular people and slow to wake up    Erosive gastritis 07/03/2011   GERD (gastroesophageal reflux disease)    History of kidney stones    Kidney stones 2011   Renal calculus    Tooth infection 07/03/2011   Past Surgical History:  Procedure Laterality Date   CATARACT EXTRACTION W/PHACO Left 08/23/2012   Procedure: CATARACT EXTRACTION PHACO  AND INTRAOCULAR LENS PLACEMENT (IOC);  Surgeon: Cherene Mania, MD;  Location: [facility];  Service: Ophthalmology;  Laterality: Left;  CDE 16.64   CATARACT EXTRACTION W/PHACO Right 06/09/2013   Procedure: CATARACT EXTRACTION PHACO AND INTRAOCULAR LENS PLACEMENT (IOC) CDE=10.86;  Surgeon: Cherene Mania, MD;  Location: [facility];  Service: Ophthalmology;  Laterality: Right;   COLON SURGERY     COLONOSCOPY  07/03/2011   Procedure: COLONOSCOPY;  Surgeon: Claudis RAYMOND Rivet, MD;  [facility]: [facility];  Service: Endoscopy;  Laterality: N/A;   COLONOSCOPY N/A 09/16/2012   Procedure: COLONOSCOPY;  Surgeon: Claudis RAYMOND Rivet, MD;  [facility];  Service: Endoscopy;  Laterality: N/A;  930   COLONOSCOPY N/A 12/05/2015   Procedure: COLONOSCOPY;  Surgeon: Claudis RAYMOND Rivet, MD;  [facility];  Service: Endoscopy;  Laterality: N/A;  1200   COLONOSCOPY WITH PROPOFOL  N/A 02/06/2021   Procedure: COLONOSCOPY WITH PROPOFOL ;  Surgeon: Rivet Claudis RAYMOND, MD;  [facility];  Service: Endoscopy;  Laterality: N/A;  12:50   CYSTOSCOPY  07/09/2011   Procedure: CYSTOSCOPY FLEXIBLE;  Surgeon: Emery LILLETTE Blaze, MD;  Location: [facility];  Service: Urology;  Laterality: N/A;   CYSTOSCOPY WITH LITHOLAPAXY  07/11/2011   Procedure: CYSTOSCOPY WITH LITHOLAPAXY;  Surgeon: Emery LILLETTE Blaze, MD;  Location: [facility];  Service: Urology;  Laterality: N/A;   ESOPHAGOGASTRODUODENOSCOPY  07/03/2011   Procedure: ESOPHAGOGASTRODUODENOSCOPY (EGD);  Surgeon: Claudis RAYMOND Rivet, MD;  [facility];  Service: Endoscopy;  Laterality: N/A;   HERNIA REPAIR  2008    umbilical    INGUINAL HERNIA REPAIR Right 05/04/2017   Procedure: HERNIA REPAIR INGUINAL ADULT WITH MESH;  Surgeon: Mavis Anes, MD;  Location: [facility];  Service: General;  Laterality: Right;   PARTIAL COLECTOMY  07/04/2011   Procedure: PARTIAL COLECTOMY;  Surgeon: Anes DELENA Mavis, MD;  Location: [facility];  Service: General;  Laterality: N/A;   POLYPECTOMY  02/06/2021   Procedure:  POLYPECTOMY;  Surgeon: Golda Claudis PENNER, MD;  [facility];  Service: Endoscopy;;   Prostage cancer     PROSTATE BIOPSY     ROBOT ASSISTED LAPAROSCOPIC RADICAL PROSTATECTOMY Bilateral 01/19/2015   Procedure: ROBOTIC ASSISTED LAPAROSCOPIC RADICAL PROSTATECTOMY AND BILATERAL PELVIC LYMPH NODE DISSECTION, LAPARASCOPIC  LYSIS OF ADHESIONS;  Surgeon: Morene LELON Salines, MD;  Location: [facility];  Service: Urology;  Laterality: Bilateral;   TRANSURETHRAL RESECTION OF PROSTATE  07/11/2011   Procedure: TRANSURETHRAL RESECTION OF THE PROSTATE (TURP);  Surgeon: Mohammad I Javaid, MD;  Location: [facility];  Service: Urology;  Laterality: N/A;   Social History:  reports that he quit smoking about 34 years ago. His smoking use included cigarettes. He started smoking about 59 years ago. He has a 50 pack-year smoking history. He has been exposed to tobacco smoke. He has never used smokeless tobacco. He reports that he does not drink alcohol and does not use drugs.  No Known Allergies  Family History  Problem Relation Age of Onset   Alzheimer's disease Mother    Alzheimer's disease Father    Aneurysm Brother        brain   Anuerysm Brother    Healthy Son    Healthy Son    Colon cancer Neg Hx     Prior to Admission medications   Medication Sig Start Date End Date Taking? Authorizing Provider  aspirin  EC 81 MG tablet Take 81 mg by mouth daily. Swallow whole.   Yes [provider]  Cholecalciferol (VITAMIN D3) 125 MCG (5000 UT) TABS Take 0.5 tablets by mouth in the morning.   Yes [provider]  divalproex  (DEPAKOTE ) 250 MG DR tablet Take 1 tablet (250 mg total) by mouth in the morning and at bedtime. 12/01/23 11/25/24 Yes Gregg Lek, MD  gabapentin  (NEURONTIN ) 300 MG capsule Take 1 capsule (300 mg total) by mouth 3 (three) times daily. Patient taking differently: Take 300 mg by mouth 2 (two) times daily. Dinnertime and bedtime 12/01/23 05/24/25 Yes Camara, Amadou, MD  Lactobacillus  (ACIDOPHILUS PO) Take 1 capsule by mouth in the morning. Now Pb8 Acidophilus   Yes [provider]  loratadine (CLARITIN) 10 MG tablet Take 10 mg by mouth in the morning.   Yes [provider]  memantine  (NAMENDA ) 10 MG tablet Take 1 tablet (10 mg total) by mouth 2 (two) times daily. 12/01/23  Yes Camara, Amadou, MD  Multiple Vitamin (MULTIVITAMIN WITH MINERALS) TABS tablet Take 1 tablet by mouth in the morning. Centrum Silver for Men 50+   Yes [provider]  Omega-3 Fatty Acids (FISH OIL) 1200 MG CAPS Take 1,200 mg by mouth in the morning.   Yes [provider]  pantoprazole  (PROTONIX ) 40 MG tablet TAKE 1 TABLET BY MOUTH ONCE DAILY. 11/15/18  Yes Rosendo Veva CROME, NP    Physical Exam: Vitals:   04/17/24 1600 04/17/24 1715 04/17/24 1720 04/17/24 1745  BP: 136/73  (!) 143/78 132/84  Pulse: 68 76 76 77  Resp: 13 13 14 15   Temp: 98.3 F (36.8 C)  TempSrc: Oral     SpO2: 97% 95% 97% 97%  Weight:      Height:       General: Elderly male laying in bed in no respiratory distress HEENT: No abnormality detected Respiratory system: Clear to auscultation bilaterally Cardiovascular system: S1-S2 present no murmur heard Abdominal system: Nontender Musculoskeletal: Moving all extremities equally CNS: Alert and oriented x 3 with no lateralizing signs Psych: Normal mood  Data Reviewed: CT scan of the brain showed progressive brain atrophy but no acute abnormality Chest x-ray did not show any acute pathology    Latest Ref Rng & Units 04/17/2024    2:12 PM 11/25/2023    6:52 PM 03/20/2022    1:39 PM  CBC  WBC 4.0 - 10.5 K/uL 7.3  9.2  6.5   Hemoglobin 13.0 - 17.0 g/dL 87.1  87.1  86.6   Hematocrit 39.0 - 52.0 % 40.2  39.4  39.5   Platelets 150 - 400 K/uL 428  222  287        Latest Ref Rng & Units 04/17/2024    2:12 PM 11/25/2023    6:52 PM 03/20/2022    1:39 PM  BMP  Glucose 70 - 99 mg/dL 79  892  897   BUN 8 - 23 mg/dL 24  15  11    Creatinine  0.61 - 1.24 mg/dL 8.51  9.11  9.05   BUN/Creat Ratio 10 - 24   12   Sodium 135 - 145 mmol/L 142  139  144   Potassium 3.5 - 5.1 mmol/L 4.4  3.5  4.7   Chloride 98 - 111 mmol/L 102  102  104   CO2 22 - 32 mmol/L 30  26  29    Calcium  8.9 - 10.3 mg/dL 9.1  8.5  9.2      Assessment and Plan:  Acute metabolic encephalopathy secondary to multifactorial etiologies.  Possible causes include dehydration as well as progressive dementia According to patient's wife patient's mental status has been declining slowly and has been having poor oral intake I suspect this is due to developing dementia as patient has strong family history of Alzheimer's dementia improved parents. Mental status also could be exacerbated by dehydration We will continue IV fluids Monitor neurochecks closely  Acute kidney injury secondary to poor oral intake Patient presented with creatinine 1.48 however baseline creatinine of 4 months ago was 0.8 We will keep on IV fluid therapy Monitor renal function closely We will avoid nephrotoxic medications Renally dose all drugs  GERD Continue pantoprazole   History of baseline cognitive decline  Continue memantine  Continue outpatient follow-up with neurologist  Seizure disorder Continue Depakote   History of colon cancer as well as prostate cancer Outpatient follow-up  VTE prophylaxis-continue Lovenox    Advance Care Planning:   Code Status: Prior full code  Consults: None  Family Communication: Discussed with patient's wife present at bedside  Severity of Illness: The appropriate patient status for this patient is OBSERVATION. Observation status is judged to be reasonable and necessary in order to provide the required intensity of service to ensure the patient's safety. The patient's presenting symptoms, physical exam findings, and initial radiographic and laboratory data in the context of their medical condition is felt to place them at decreased risk for further  clinical deterioration. Furthermore, it is anticipated that the patient will be medically stable for discharge from the hospital within 2 midnights of admission.   Author: Drue ONEIDA Potter, MD 04/17/2024 6:20 PM  For on call  review [email or web address].

## 2024-04-17 NOTE — ED Triage Notes (Signed)
 Pt arrived POV with increased AMS. Pt neurologist suggested to be seen to rule out infection and not just believe that it is progressed to dementia.

## 2024-04-18 DIAGNOSIS — Z1152 Encounter for screening for COVID-19: Secondary | ICD-10-CM | POA: Diagnosis not present

## 2024-04-18 DIAGNOSIS — Z85038 Personal history of other malignant neoplasm of large intestine: Secondary | ICD-10-CM | POA: Diagnosis not present

## 2024-04-18 DIAGNOSIS — Z87442 Personal history of urinary calculi: Secondary | ICD-10-CM | POA: Diagnosis not present

## 2024-04-18 DIAGNOSIS — K219 Gastro-esophageal reflux disease without esophagitis: Secondary | ICD-10-CM | POA: Diagnosis present

## 2024-04-18 DIAGNOSIS — Z9841 Cataract extraction status, right eye: Secondary | ICD-10-CM | POA: Diagnosis not present

## 2024-04-18 DIAGNOSIS — G9341 Metabolic encephalopathy: Secondary | ICD-10-CM | POA: Diagnosis present

## 2024-04-18 DIAGNOSIS — Z9079 Acquired absence of other genital organ(s): Secondary | ICD-10-CM | POA: Diagnosis not present

## 2024-04-18 DIAGNOSIS — Z87891 Personal history of nicotine dependence: Secondary | ICD-10-CM | POA: Diagnosis not present

## 2024-04-18 DIAGNOSIS — N179 Acute kidney failure, unspecified: Secondary | ICD-10-CM | POA: Diagnosis present

## 2024-04-18 DIAGNOSIS — Z8719 Personal history of other diseases of the digestive system: Secondary | ICD-10-CM | POA: Diagnosis not present

## 2024-04-18 DIAGNOSIS — Z8546 Personal history of malignant neoplasm of prostate: Secondary | ICD-10-CM | POA: Diagnosis not present

## 2024-04-18 DIAGNOSIS — F0390 Unspecified dementia without behavioral disturbance: Secondary | ICD-10-CM | POA: Diagnosis present

## 2024-04-18 DIAGNOSIS — Z7982 Long term (current) use of aspirin: Secondary | ICD-10-CM | POA: Diagnosis not present

## 2024-04-18 DIAGNOSIS — E860 Dehydration: Secondary | ICD-10-CM | POA: Diagnosis present

## 2024-04-18 DIAGNOSIS — Z961 Presence of intraocular lens: Secondary | ICD-10-CM | POA: Diagnosis present

## 2024-04-18 DIAGNOSIS — G40909 Epilepsy, unspecified, not intractable, without status epilepticus: Secondary | ICD-10-CM | POA: Diagnosis present

## 2024-04-18 DIAGNOSIS — N400 Enlarged prostate without lower urinary tract symptoms: Secondary | ICD-10-CM | POA: Diagnosis present

## 2024-04-18 DIAGNOSIS — Z9842 Cataract extraction status, left eye: Secondary | ICD-10-CM | POA: Diagnosis not present

## 2024-04-18 DIAGNOSIS — Z79899 Other long term (current) drug therapy: Secondary | ICD-10-CM | POA: Diagnosis not present

## 2024-04-18 LAB — BASIC METABOLIC PANEL WITH GFR
Anion gap: 9 (ref 5–15)
BUN: 22 mg/dL (ref 8–23)
CO2: 28 mmol/L (ref 22–32)
Calcium: 8.6 mg/dL — ABNORMAL LOW (ref 8.9–10.3)
Chloride: 106 mmol/L (ref 98–111)
Creatinine, Ser: 1.36 mg/dL — ABNORMAL HIGH (ref 0.61–1.24)
GFR, Estimated: 53 mL/min — ABNORMAL LOW (ref >60)
Glucose, Bld: 87 mg/dL (ref 70–99)
Potassium: 4.1 mmol/L (ref 3.5–5.1)
Sodium: 143 mmol/L (ref 135–145)

## 2024-04-18 LAB — MRSA NEXT GEN BY PCR, NASAL: MRSA by PCR Next Gen: NOT DETECTED

## 2024-04-18 LAB — CBC
HCT: 38.9 % — ABNORMAL LOW (ref 39.0–52.0)
Hemoglobin: 12.5 g/dL — ABNORMAL LOW (ref 13.0–17.0)
MCH: 29.4 pg (ref 26.0–34.0)
MCHC: 32.1 g/dL (ref 30.0–36.0)
MCV: 91.5 fL (ref 80.0–100.0)
Platelets: 412 K/uL — ABNORMAL HIGH (ref 150–400)
RBC: 4.25 MIL/uL (ref 4.22–5.81)
RDW: 12.5 % (ref 11.5–15.5)
WBC: 7.9 K/uL (ref 4.0–10.5)
nRBC: 0 % (ref 0.0–0.2)

## 2024-04-18 MED ORDER — SODIUM CHLORIDE 0.9 % IV SOLN: INTRAVENOUS | Status: AC

## 2024-04-18 MED ORDER — HALOPERIDOL 2 MG PO TABS
2.0000 mg | ORAL_TABLET | Freq: Three times a day (TID) | ORAL | Status: DC | PRN
  Administered 2024-04-18: 2 mg via ORAL
  Filled 2024-04-18: qty 1

## 2024-04-18 NOTE — Progress Notes (Signed)
 PROGRESS NOTE    William Burns  FMW:[id number] DOB: 05-Jun-1945 DOA: 04/17/2024 PCP: Shona Norleen PEDLAR, MD    Brief Narrative:  78 year old with history of BPH, colon cancer, GERD, dementia followed by outpatient neurologist, reportedly getting more weak and frequently falling for the last few days, not eating or drinking enough so brought to the ER.  In the emergency room hemodynamically stable.  On room air.  Creatinine elevated.  Admitted with IV fluids.  Subjective: Patient seen and examined.  Poor historian.  Patient himself denies any complaints.  He does not know why he is in the hospital.  His son was at the bedside. Urine output is not well-documented.  Assessment & Plan:   Acute metabolic encephalopathy likely due to dehydration in a patient with underlying dementia: Likely progressive dementia and poor oral intake. Does not have any evidence of UTI or pneumonia. Will continue IV fluids today.  Encourage oral intake.  Recheck renal functions tomorrow morning.  Given at home, patient needs to be encouraged and assist intake. Patient on memantine  and that will be continued. For seizure disorder patient is Depakote .  Continued.  GERD, on PPI.  Seizure disorder, on Depakote .  Continue IV fluids.  PT OT.  Hopefully home tomorrow with advice.    DVT prophylaxis: enoxaparin  (LOVENOX ) injection 40 mg Start: 04/17/24 2200   Code Status: Full code Family Communication: Son at the bedside Disposition Plan: Status is: Observation The patient will require care spanning > 2 midnights and should be moved to inpatient because: IV fluids, renal function monitoring     Consultants:  None  Procedures:  None  Antimicrobials:  None     Objective: Vitals:   04/18/24 0743 04/18/24 0800 04/18/24 0900 04/18/24 1000  BP:  (!) 138/54 (!) 145/76 133/66  Pulse:  70 72 77  Resp:  17    Temp: 97.9 F (36.6 C)     TempSrc: Oral     SpO2:  95% 97% 98%  Weight:      Height:         Intake/Output Summary (Last 24 hours) at 04/18/2024 1233 Last data filed at 04/18/2024 0949 Gross per 24 hour  Intake 1256.5 ml  Output 200 ml  Net 1056.5 ml   Filed Weights   04/17/24 1404 04/17/24 2310  Weight: 108.9 kg 106.9 kg    Examination:  General exam: Appears calm and comfortable.  Alert and awake.  Oriented to himself and family.  Flat affect.  Not very interactive. Respiratory system: Clear to auscultation. Respiratory effort normal.  No added sounds. Cardiovascular system: S1 & S2 heard, RRR. No JVD, murmurs, rubs, gallops or clicks. No pedal edema. Gastrointestinal system: Abdomen is nondistended, soft and nontender. No organomegaly or masses felt. Normal bowel sounds heard.    Data Reviewed: I have personally reviewed following labs and imaging studies  CBC: Recent Labs  Lab 04/17/24 1412 04/18/24 0357  WBC 7.3 7.9  HGB 12.8* 12.5*  HCT 40.2 38.9*  MCV 92.0 91.5  PLT 428* 412*   Basic Metabolic Panel: Recent Labs  Lab 04/17/24 1412 04/18/24 0357  NA 142 143  K 4.4 4.1  CL 102 106  CO2 30 28  GLUCOSE 79 87  BUN 24* 22  CREATININE 1.48* 1.36*  CALCIUM  9.1 8.6*   GFR: Estimated Creatinine Clearance: 58.2 mL/min (A) (by C-G formula based on SCr of 1.36 mg/dL (H)). Liver Function Tests: Recent Labs  Lab 04/17/24 1412  AST 37  ALT 31  ALKPHOS 97  BILITOT 0.4  PROT 7.8  ALBUMIN 3.9   No results for input(s): LIPASE, AMYLASE in the last 168 hours. No results for input(s): AMMONIA in the last 168 hours. Coagulation Profile: No results for input(s): INR, PROTIME in the last 168 hours. Cardiac Enzymes: No results for input(s): CKTOTAL, CKMB, CKMBINDEX, TROPONINI in the last 168 hours. BNP (last 3 results) No results for input(s): PROBNP in the last 8760 hours. HbA1C: No results for input(s): HGBA1C in the last 72 hours. CBG: Recent Labs  Lab 04/17/24 1438  GLUCAP 72   Lipid Profile: No results for input(s):  CHOL, HDL, LDLCALC, TRIG, CHOLHDL, LDLDIRECT in the last 72 hours. Thyroid  Function Tests: No results for input(s): TSH, T4TOTAL, FREET4, T3FREE, THYROIDAB in the last 72 hours. Anemia Panel: No results for input(s): VITAMINB12, FOLATE, FERRITIN, TIBC, IRON, RETICCTPCT in the last 72 hours. Sepsis Labs: No results for input(s): PROCALCITON, LATICACIDVEN in the last 168 hours.  Recent Results (from the past 240 hours)  Resp panel by RT-PCR (RSV, Flu A&B, Covid) Anterior Nasal Swab     Status: None   Collection Time: 04/17/24  3:08 PM   Specimen: Anterior Nasal Swab  Result Value Ref Range Status   SARS Coronavirus 2 by RT PCR NEGATIVE NEGATIVE Final    Comment: (NOTE) SARS-CoV-2 target nucleic acids are NOT DETECTED.  The SARS-CoV-2 RNA is generally detectable in upper respiratory specimens during the acute phase of infection. The lowest concentration of SARS-CoV-2 viral copies this assay can detect is 138 copies/mL. A negative result does not preclude SARS-Cov-2 infection and should not be used as the sole basis for treatment or other patient management decisions. A negative result may occur with  improper specimen collection/handling, submission of specimen other than nasopharyngeal swab, presence of viral mutation(s) within the areas targeted by this assay, and inadequate number of viral copies(<138 copies/mL). A negative result must be combined with clinical observations, patient history, and epidemiological information. The expected result is Negative.  Fact Sheet for Patients:  bloggercourse.com  Fact Sheet for Healthcare Providers:  seriousbroker.it  This test is no t yet approved or cleared by the [place]  FDA and  has been authorized for detection and/or diagnosis of SARS-CoV-2 by FDA under an Emergency Use Authorization (EUA). This EUA will remain  in effect (meaning this test  can be used) for the duration of the COVID-19 declaration under Section 564(b)(1) of the Act, 21 U.S.C.section 360bbb-3(b)(1), unless the authorization is terminated  or revoked sooner.       Influenza A by PCR NEGATIVE NEGATIVE Final   Influenza B by PCR NEGATIVE NEGATIVE Final    Comment: (NOTE) The Xpert Xpress SARS-CoV-2/FLU/RSV plus assay is intended as an aid in the diagnosis of influenza from Nasopharyngeal swab specimens and should not be used as a sole basis for treatment. Nasal washings and aspirates are unacceptable for Xpert Xpress SARS-CoV-2/FLU/RSV testing.  Fact Sheet for Patients: bloggercourse.com  Fact Sheet for Healthcare Providers: seriousbroker.it  This test is not yet approved or cleared by the [place]  FDA and has been authorized for detection and/or diagnosis of SARS-CoV-2 by FDA under an Emergency Use Authorization (EUA). This EUA will remain in effect (meaning this test can be used) for the duration of the COVID-19 declaration under Section 564(b)(1) of the Act, 21 U.S.C. section 360bbb-3(b)(1), unless the authorization is terminated or revoked.     Resp Syncytial Virus by PCR NEGATIVE NEGATIVE Final    Comment: (NOTE) Fact Sheet for  Patients: bloggercourse.com  Fact Sheet for Healthcare Providers: seriousbroker.it  This test is not yet approved or cleared by the [place]  FDA and has been authorized for detection and/or diagnosis of SARS-CoV-2 by FDA under an Emergency Use Authorization (EUA). This EUA will remain in effect (meaning this test can be used) for the duration of the COVID-19 declaration under Section 564(b)(1) of the Act, 21 U.S.C. section 360bbb-3(b)(1), unless the authorization is terminated or revoked.  Performed at [facility], [address] [place], [place] [address]   MRSA Next Gen by PCR, Nasal     Status: None    Collection Time: 04/17/24 11:07 PM   Specimen: Nasal Mucosa; Nasal Swab  Result Value Ref Range Status   MRSA by PCR Next Gen NOT DETECTED NOT DETECTED Final    Comment: (NOTE) The GeneXpert MRSA Assay (FDA approved for NASAL specimens only), is one component of a comprehensive MRSA colonization surveillance program. It is not intended to diagnose MRSA infection nor to guide or monitor treatment for MRSA infections. Test performance is not FDA approved in patients less than 45 years old. Performed at [facility], [address] [place], [place] [address]          Radiology Studies: CT Head Wo Contrast Result Date: 04/17/2024 CLINICAL DATA:  Trauma, fall, increased altered mental status EXAM: CT HEAD WITHOUT CONTRAST TECHNIQUE: Contiguous axial images were obtained from the base of the skull through the vertex without intravenous contrast. RADIATION DOSE REDUCTION: This exam was performed according to the departmental dose-optimization program which includes automated exposure control, adjustment of the mA and/or kV according to patient size and/or use of iterative reconstruction technique. COMPARISON:  None Available. FINDINGS: Brain: Progressive diffuse cerebral and cerebellar brain atrophy pattern. Chronic white matter microvascular ischemic changes as well. No acute intracranial hemorrhage, mass lesion, new infarction, midline shift, herniation, hydrocephalus, or extra-axial fluid collection. Cisterns are patent. Vascular: No hyperdense vessel or unexpected calcification. Skull: Normal. Negative for fracture or focal lesion. Sinuses/Orbits: No acute finding. Other: None. IMPRESSION: 1. Progressive brain atrophy and chronic white matter microvascular ischemic changes. 2. No acute intracranial abnormality by noncontrast CT. Electronically Signed   By: CHRISTELLA.  Shick M.D.   On: 04/17/2024 16:46   CT Cervical Spine Wo Contrast Result Date: 04/17/2024 CLINICAL DATA:  Neck trauma (Age >= 65y)  Multiple falls with altered mental status. EXAM: CT CERVICAL SPINE WITHOUT CONTRAST TECHNIQUE: Multidetector CT imaging of the cervical spine was performed without intravenous contrast. Multiplanar CT image reconstructions were also generated. RADIATION DOSE REDUCTION: This exam was performed according to the departmental dose-optimization program which includes automated exposure control, adjustment of the mA and/or kV according to patient size and/or use of iterative reconstruction technique. COMPARISON:  None Available. FINDINGS: Alignment: Straightening with a mild convex left scoliosis. No focal angulation or listhesis. Skull base and vertebrae: No evidence of acute cervical spine fracture or traumatic subluxation. The right C2-3 facet joint is ankylosed. Soft tissues and spinal canal: No prevertebral fluid or swelling. No visible canal hematoma. Disc levels: Multilevel spondylosis with uncinate spurring and bilateral facet hypertrophy. Up to moderate multifactorial spinal stenosis at C4-5 and C5-6. Multilevel foraminal narrowing which appears worst on the right at C3-4, C4-5 and C5-6. Left foraminal narrowing appears worst at C6-7. Upper chest: Clear lung apices. Other: None. IMPRESSION: 1. No evidence of acute cervical spine fracture, traumatic subluxation or static signs of instability. 2. Multilevel cervical spondylosis as described with up to moderate multifactorial spinal stenosis at C4-5  and C5-6. Multilevel osseous foraminal narrowing. Electronically Signed   By: Elsie Perone M.D.   On: 04/17/2024 16:37   DG Chest Port 1 View Result Date: 04/17/2024 CLINICAL DATA:  Altered mental status. EXAM: PORTABLE CHEST 1 VIEW COMPARISON:  12/30/2023 FINDINGS: The heart size and mediastinal contours are within normal limits. Both lungs are clear. The visualized skeletal structures are unremarkable. IMPRESSION: No active disease. Electronically Signed   By: Norleen DELENA Kil M.D.   On: 04/17/2024 15:46         Scheduled Meds:  aspirin  EC  81 mg Oral Daily   Chlorhexidine  Gluconate Cloth  6 each Topical Q0600   divalproex   250 mg Oral BID   enoxaparin  (LOVENOX ) injection  40 mg Subcutaneous Q24H   memantine   10 mg Oral BID   pantoprazole   40 mg Oral Daily   Continuous Infusions:  sodium chloride  75 mL/hr at 04/18/24 0949     LOS: 0 days      Renato Applebaum, MD [facility]

## 2024-04-18 NOTE — Plan of Care (Signed)
  Problem: Acute Rehab OT Goals (only OT should resolve) Goal: Pt. Will Perform Grooming Flowsheets (Taken 04/18/2024 1122) Pt Will Perform Grooming:  with modified independence  standing Goal: Pt. Will Perform Upper Body Dressing Flowsheets (Taken 04/18/2024 1122) Pt Will Perform Upper Body Dressing: with modified independence Goal: Pt. Will Perform Lower Body Dressing Flowsheets (Taken 04/18/2024 1122) Pt Will Perform Lower Body Dressing: with modified independence Goal: Pt. Will Transfer To Toilet Flowsheets (Taken 04/18/2024 1122) Pt Will Transfer to Toilet:  with modified independence  ambulating Goal: Pt. Will Perform Toileting-Clothing Manipulation Flowsheets (Taken 04/18/2024 1122) Pt Will Perform Toileting - Clothing Manipulation and hygiene:  with modified independence  sit to/from stand  sitting/lateral leans  Jannie Doyle OT, MOT

## 2024-04-18 NOTE — Evaluation (Signed)
 Physical Therapy Evaluation Patient Details Name: William Burns MRN: [id number] DOB: 1945/08/20 Today's Date: 04/18/2024  History of Present Illness  [name] is a 78 y.o. male with medical history significant of BPH, history of colon cancer, gastritis, GERD, history of kidney stones, history of baseline cognitive decline who follows up with outpatient neurologist Seen after wife realized that patient has been having generalized weakness, frequent falls as well as increased urination within the last few days.  According to the wife patient has not been eating as expected and not drinking enough.  At the time patient was being seen he was able to contribute to history and denied nausea vomiting abdominal pain chest pain or cough    Clinical Impression  Pt. Presented w/ general weakness in LE, OT co-treated this session. Pt was able understand and perform commands given during treatment. Pt was able to perform bed mobility w/ CGA/ Min A   to EOB. Pt was able to perform transfer from bed to chair and  side step ambulation w/ Min A/ Mod A w/ AD initially. During backwards and forward ambulation pt. Used w/ RW w/ Min A for more stability. Pt was left in chair w/ wife and call bell. Nursing staff was notified on pt. Status. Patient will benefit from continued skilled physical therapy in hospital and recommended venue below to increase strength, balance, endurance for safe ADLs and gait.       If plan is discharge home, recommend the following: A little help with walking and/or transfers;A little help with bathing/dressing/bathroom;Help with stairs or ramp for entrance;Assist for transportation;Assistance with cooking/housework   Can travel by private vehicle   Yes    Equipment Recommendations None recommended by PT  Recommendations for Other Services       Functional Status Assessment Patient has had a recent decline in their functional status and demonstrates the ability to make  significant improvements in function in a reasonable and predictable amount of time.     Precautions / Restrictions Precautions Precautions: Fall Recall of Precautions/Restrictions: Impaired Restrictions Weight Bearing Restrictions Per Provider Order: No      Mobility  Bed Mobility Overal bed mobility: Needs Assistance Bed Mobility: Supine to Sit     Supine to sit: Min assist, Contact guard     General bed mobility comments: labored movement    Transfers Overall transfer level: Needs assistance   Transfers: Sit to/from Stand, Bed to chair/wheelchair/BSC Sit to Stand: Mod assist   Step pivot transfers: Min assist, Mod assist       General transfer comment: labored effort; unsteady with need of much verbal and tactile cuing    Ambulation/Gait Ambulation/Gait assistance: Mod assist, Min assist Gait Distance (Feet): 11 Feet Assistive device: Rolling walker (2 wheels) Gait Pattern/deviations: Step-to pattern, Decreased step length - right, Decreased step length - left, Decreased stance time - right, Decreased stance time - left, Decreased stride length, Shuffle, Narrow base of support       General Gait Details: Pt. was able to perform backwards and foward ambulation w/ RW, pt had narrow BOS w/Rw and had diffculty w/ walking and using the RW initally.  Stairs            Wheelchair Mobility     Tilt Bed    Modified Rankin (Stroke Patients Only)       Balance Overall balance assessment: Needs assistance, History of Falls Sitting-balance support: Feet supported, No upper extremity supported Sitting balance-Leahy Scale: Good Sitting balance -  Comments: seated at EOB   Standing balance support: No upper extremity supported, During functional activity Standing balance-Leahy Scale: Poor Standing balance comment: poor to fair without AD ; fair with RW                             Pertinent Vitals/Pain Pain Assessment Pain Assessment: No/denies  pain    Home Living Family/patient expects to be discharged to:: Private residence Living Arrangements: Spouse/significant other Available Help at Discharge: Family;Available 24 hours/day Type of Home: House Home Access: Stairs to enter Entrance Stairs-Rails: Right Entrance Stairs-Number of Steps: 3   Home Layout: Able to live on main level with bedroom/bathroom;Laundry or work area in [employer] Equipment: Agricultural Consultant (2 wheels);BSC/3in1;Rollator (4 wheels);Shower seat;Hand held shower head;Grab bars - tub/shower;Lift chair      Prior Function Prior Level of Function : Needs assist             Mobility Comments: Ambulates without AD typically but has had to use a RW the past week. ADLs Comments: More assist the past week. Typically independent for ADL's.     Extremity/Trunk Assessment   Upper Extremity Assessment Upper Extremity Assessment: Defer to OT evaluation    Lower Extremity Assessment Lower Extremity Assessment: Generalized weakness    Cervical / Trunk Assessment Cervical / Trunk Assessment: Normal  Communication   Communication Communication: No apparent difficulties    Cognition Arousal: Alert Behavior During Therapy: WFL for tasks assessed/performed                             Following commands: Intact       Cueing Cueing Techniques: Verbal cues, Tactile cues     General Comments      Exercises     Assessment/Plan    PT Assessment Patient needs continued PT services  PT Problem List Decreased strength;Decreased range of motion;Decreased activity tolerance;Decreased balance;Decreased mobility;Decreased coordination;Decreased safety awareness       PT Treatment Interventions DME instruction;Gait training;Stair training;Functional mobility training;Therapeutic activities;Therapeutic exercise;Balance training;Patient/family education    PT Goals (Current goals can be found in the Care Plan section)  Acute Rehab PT  Goals Patient Stated Goal: Pt. wants to return home PT Goal Formulation: With patient/family Time For Goal Achievement: 04/26/24 Potential to Achieve Goals: Good    Frequency Min 3X/week     Co-evaluation PT/OT/SLP Co-Evaluation/Treatment: Yes Reason for Co-Treatment: To address functional/ADL transfers PT goals addressed during session: Mobility/safety with mobility OT goals addressed during session: ADL's and self-care       AM-PAC PT 6 Clicks Mobility  Outcome Measure Help needed turning from your back to your side while in a flat bed without using bedrails?: A Little Help needed moving from lying on your back to sitting on the side of a flat bed without using bedrails?: A Little Help needed moving to and from a bed to a chair (including a wheelchair)?: A Little Help needed standing up from a chair using your arms (e.g., wheelchair or bedside chair)?: A Little Help needed to walk in hospital room?: A Lot Help needed climbing 3-5 steps with a railing? : A Lot 6 Click Score: 16    End of Session Equipment Utilized During Treatment: Gait belt Activity Tolerance: Patient tolerated treatment well;Patient limited by fatigue Patient left: in chair;with family/visitor present;with call bell/phone within reach   PT Visit Diagnosis: Unsteadiness on feet (R26.81);History of falling (  Z91.81);Muscle weakness (generalized) (M62.81)    Time: 9169-9143 PT Time Calculation (min) (ACUTE ONLY): 26 min   Charges:   PT Evaluation $PT Eval Moderate Complexity: 1 Mod PT Treatments $Therapeutic Activity: 23-37 mins PT General Charges $$ ACUTE PT VISIT: 1 Visit         Keveon Amsler, SPT

## 2024-04-18 NOTE — Plan of Care (Signed)

## 2024-04-18 NOTE — Plan of Care (Signed)
  Problem: Acute Rehab PT Goals(only PT should resolve) Goal: Pt Will Go Supine/Side To Sit Outcome: Progressing Flowsheets (Taken 04/18/2024 1223) Pt will go Supine/Side to Sit:  with supervision  with contact guard assist Goal: Pt Will Go Sit To Supine/Side Outcome: Progressing Flowsheets (Taken 04/18/2024 1223) Pt will go Sit to Supine/Side:  with contact guard assist  with supervision Goal: Patient Will Perform Sitting Balance Outcome: Progressing Flowsheets (Taken 04/18/2024 1223) Patient will perform sitting balance:  with contact guard assist  with supervision Goal: Patient Will Transfer Sit To/From Stand Outcome: Progressing Flowsheets (Taken 04/18/2024 1223) Patient will transfer sit to/from stand:  with contact guard assist  with minimal assist Goal: Pt Will Transfer Bed To Chair/Chair To Bed Outcome: Progressing Flowsheets (Taken 04/18/2024 1223) Pt will Transfer Bed to Chair/Chair to Bed:  with contact guard assist  with min assist Goal: Pt Will Perform Standing Balance Or Pre-Gait Outcome: Progressing Flowsheets (Taken 04/18/2024 1223) Pt will perform standing balance or pre-gait:  with contact guard assist  with minimal assist Note: W/ RW Goal: Pt Will Ambulate Outcome: Progressing Flowsheets (Taken 04/18/2024 1223) Pt will Ambulate:  with rolling walker  with contact guard assist  with minimal assist  25 feet  50 feet   Ivery Cable, SPT

## 2024-04-18 NOTE — Evaluation (Signed)
 Occupational Therapy Evaluation Patient Details Name: JOHNNEY SCARLATA MRN: [id number] DOB: 01-Jun-1946 Today's Date: 04/18/2024   History of Present Illness   William Burns is a 78 y.o. male with medical history significant of BPH, history of colon cancer, gastritis, GERD, history of kidney stones, history of baseline cognitive decline who follows up with outpatient neurologist Seen after wife realized that patient has been having generalized weakness, frequent falls as well as increased urination within the last few days.  According to the wife patient has not been eating as expected and not drinking enough.  At the time patient was being seen he was able to contribute to history and denied nausea vomiting abdominal pain chest pain or cough (per MD)     Clinical Impressions Pt agreeable to OT and PT co-evaluation. Pt has dementia at baseline but the pt's wife reports he is more confused that baseline. Today pt required min to mod A for EOB to chair transfer. Min A needed for bed mobility. Pt is able to complete seated ADL's with set up to CGA based on observation today. Pt able to ambulate with RW and min A in the room. Pt left in the chair with call bell within reach and family present. Pt will benefit from continued OT in the hospital to increase strength, balance, and endurance for safe ADL's.        If plan is discharge home, recommend the following:   A lot of help with walking and/or transfers;A little help with bathing/dressing/bathroom;Assistance with cooking/housework;Assist for transportation;Help with stairs or ramp for entrance;Direct supervision/assist for medications management     Functional Status Assessment   Patient has had a recent decline in their functional status and demonstrates the ability to make significant improvements in function in a reasonable and predictable amount of time.     Equipment Recommendations   None recommended by OT               Precautions/Restrictions   Precautions Precautions: Fall Recall of Precautions/Restrictions: Impaired Restrictions Weight Bearing Restrictions Per Provider Order: No     Mobility Bed Mobility Overal bed mobility: Needs Assistance Bed Mobility: Supine to Sit     Supine to sit: Min assist, Contact guard     General bed mobility comments: labored movement    Transfers Overall transfer level: Needs assistance   Transfers: Sit to/from Stand, Bed to chair/wheelchair/BSC Sit to Stand: Mod assist     Step pivot transfers: Min assist, Mod assist     General transfer comment: labored effort; unsteady with need of much verbal and tactile cuing      Balance Overall balance assessment: Needs assistance, History of Falls Sitting-balance support: Feet supported, No upper extremity supported Sitting balance-Leahy Scale: Good Sitting balance - Comments: seated at EOB   Standing balance support: No upper extremity supported, During functional activity Standing balance-Leahy Scale: Poor Standing balance comment: poor to fair without AD ; fair with RW                           ADL either performed or assessed with clinical judgement   ADL Overall ADL's : Needs assistance/impaired     Grooming: Set up;Sitting;Supervision/safety   Upper Body Bathing: Set up;Supervision/ safety;Sitting   Lower Body Bathing: Contact guard assist;Sitting/lateral leans   Upper Body Dressing : Set up;Supervision/safety;Sitting   Lower Body Dressing: Contact guard assist;Sitting/lateral leans   Toilet Transfer: Moderate assistance;Minimal assistance;Stand-pivot;Rolling walker (2 wheels) Toilet  Transfer Details (indicate cue type and reason): Simulated via EOB to chair without AD and ambulation in the room with RW. Toileting- Clothing Manipulation and Hygiene: Contact guard assist;Sitting/lateral lean       Functional mobility during ADLs: Minimal assistance;Rolling walker (2  wheels) General ADL Comments: Able to ambualte a short distance in the rom.     Vision Baseline Vision/History: 1 Wears glasses Ability to See in Adequate Light: 1 Impaired Patient Visual Report: No change from baseline Vision Assessment?: Wears glasses for reading;No apparent visual deficits     Perception Perception: Not tested       Praxis Praxis: Not tested       Pertinent Vitals/Pain Pain Assessment Pain Assessment: No/denies pain     Extremity/Trunk Assessment Upper Extremity Assessment Upper Extremity Assessment: Overall WFL for tasks assessed   Lower Extremity Assessment Lower Extremity Assessment: Defer to PT evaluation   Cervical / Trunk Assessment Cervical / Trunk Assessment: Normal   Communication Communication Communication: No apparent difficulties   Cognition Arousal: Alert Behavior During Therapy: WFL for tasks assessed/performed Cognition: History of cognitive impairments, Cognition impaired   Orientation impairments: Time, Situation         OT - Cognition Comments: Wife reports the pt's congition is not at baseline. Reports he goes in and out of confusion that is not his baseline.                 Following commands: Intact       Cueing  General Comments   Cueing Techniques: Verbal cues;Tactile cues                 Home Living Family/patient expects to be discharged to:: Private residence Living Arrangements: Spouse/significant other Available Help at Discharge: Family;Available 24 hours/day Type of Home: House Home Access: Stairs to enter [employer] of Steps: 3 Entrance Stairs-Rails: Right Home Layout: Able to live on main level with bedroom/bathroom;Laundry or work area in basement     [employer] Shower/Tub: Chief Strategy Officer: Handicapped height Bathroom Accessibility: No   Home Equipment: Agricultural Consultant (2 wheels);BSC/3in1;Rollator (4 wheels);Shower seat;Hand held shower head;Grab bars -  tub/shower;Lift chair          Prior Functioning/Environment Prior Level of Function : Needs assist             Mobility Comments: Ambulates without AD typically but has had to use a RW the past week. ADLs Comments: More assist the past week. Typically independent for ADL's.    OT Problem List: Decreased strength;Decreased activity tolerance;Impaired balance (sitting and/or standing);Decreased safety awareness;Decreased cognition;Decreased knowledge of use of DME or AE   OT Treatment/Interventions: Self-care/ADL training;Therapeutic exercise;Therapeutic activities;Balance training;Patient/family education;DME and/or AE instruction;Cognitive remediation/compensation      OT Goals(Current goals can be found in the care plan section)   Acute Rehab OT Goals Patient Stated Goal: Improve function OT Goal Formulation: With patient/family Time For Goal Achievement: 05/02/24 Potential to Achieve Goals: Good   OT Frequency:  Min 2X/week    Co-evaluation PT/OT/SLP Co-Evaluation/Treatment: Yes Reason for Co-Treatment: To address functional/ADL transfers   OT goals addressed during session: ADL's and self-care      AM-PAC OT 6 Clicks Daily Activity     Outcome Measure Help from another person eating meals?: None Help from another person taking care of personal grooming?: A Little Help from another person toileting, which includes using toliet, bedpan, or urinal?: A Little Help from another person bathing (including washing, rinsing, drying)?: A Little  Help from another person to put on and taking off regular upper body clothing?: A Little Help from another person to put on and taking off regular lower body clothing?: A Little 6 Click Score: 19   End of Session Equipment Utilized During Treatment: Gait belt;Rolling walker (2 wheels)  Activity Tolerance: Patient tolerated treatment well Patient left: in chair;with call bell/phone within reach;with family/visitor present  OT  Visit Diagnosis: Unsteadiness on feet (R26.81);Other abnormalities of gait and mobility (R26.89);Repeated falls (R29.6);Muscle weakness (generalized) (M62.81);History of falling (Z91.81);Other symptoms and signs involving cognitive function                Time: 9159-9143 OT Time Calculation (min): 16 min Charges:  OT General Charges $OT Visit: 1 Visit OT Evaluation $OT Eval Low Complexity: 1 Low Kitty Cadavid OT, MOT   Jayson Person 04/18/2024, 11:20 AM

## 2024-04-18 NOTE — TOC Initial Note (Deleted)
 Transition of Care [facility]) - Initial/Assessment Note    Patient Details  Name: William Burns MRN: [id number] Date of Birth: September 24, 1945  [facility] CM/SW Contact:    Sharlyne Stabs, RN Phone Number: 04/18/2024, 1:02 PM   Expected Discharge Plan: Home w Home Health Services Barriers to Discharge: Continued Medical Work up   Patient Goals and CMS Choice Patient states their goals for this hospitalization and ongoing recovery are:: Return home CMS Medicare.gov Compare Post Acute Care list provided to:: Patient Represenative (must comment) Choice offered to / list presented to : Spouse [place] ownership interest in [facility].provided to:: Spouse    Expected Discharge Plan and Services       Living arrangements for the past 2 months: Single Family Home                           HH Arranged: PT, OT          Prior Living Arrangements/Services Living arrangements for the past 2 months: Single Family Home Lives with:: Spouse Patient language and need for interpreter reviewed:: Yes Do you feel safe going back to the place where you live?: Yes      Need for Family Participation in Patient Care: Yes (Comment) Care giver support system in place?: Yes (comment) Current home services: DME Criminal Activity/Legal Involvement Pertinent to Current Situation/Hospitalization: No - Comment as needed  Activities of Daily Living   ADL Screening (condition at time of admission) Independently performs ADLs?: No Does the patient have a NEW difficulty with bathing/dressing/toileting/self-feeding that is expected to last >3 days?: Yes (Initiates electronic notice to provider for possible OT consult) Does the patient have a NEW difficulty with getting in/out of bed, walking, or climbing stairs that is expected to last >3 days?: Yes (Initiates electronic notice to provider for possible PT consult) Does the patient have a NEW difficulty with communication that is  expected to last >3 days?: Yes (Initiates electronic notice to provider for possible SLP consult) Is the patient deaf or have difficulty hearing?: No Does the patient have difficulty seeing, even when wearing glasses/contacts?: No Does the patient have difficulty concentrating, remembering, or making decisions?: Yes  Permission Sought/Granted            Permission granted to share info w Relationship: wife     Emotional Assessment     Affect (typically observed): Accepting Orientation: : Oriented to Self Alcohol / Substance Use: Not Applicable Psych Involvement: No (comment)  Admission diagnosis:  Acute metabolic encephalopathy [G93.41] Patient Active Problem List   Diagnosis Date Noted   Acute metabolic encephalopathy 04/17/2024   Left thyroid  nodule 01/13/2019   Non-recurrent unilateral inguinal hernia without obstruction or gangrene    GERD (gastroesophageal reflux disease) 01/26/2015   TIA (transient ischemic attack)    Transient global amnesia 01/25/2015   Prostate cancer (HCC) 08/29/2014   Kidney stone on left side 08/02/2013   Diarrhea 07/05/2013   Short-segment Barrett's esophagus 02/09/2012   Calculus, bladder 07/04/2011   Tooth infection 07/03/2011   Adenocarcinoma of colon (HCC) 07/03/2011   Erosive gastritis 07/03/2011   Microcytic hypochromic anemia 07/02/2011   Hx of colonic polyp 07/02/2011   Hyperglycemia 07/02/2011   BPH (benign prostatic hyperplasia) 07/02/2011   PCP:  Shona Norleen PEDLAR, MD Pharmacy:   [facility] - [place], [place] - [address] [place] [address] Phone: [phone] Fax: [phone]  Social Drivers of Health (SDOH) Social History: SDOH Screenings   Food Insecurity: No Food Insecurity (04/17/2024)  Housing: Low Risk  (04/17/2024)  Transportation Needs: No Transportation Needs (04/17/2024)  Utilities: Not At Risk (04/17/2024)  Alcohol Screen: Low Risk  (01/01/2022)  Depression (PHQ2-9):  Low Risk  (01/01/2022)  Financial Resource Strain: Low Risk  (01/01/2022)  Physical Activity: Inactive (01/01/2022)  Social Connections: Moderately Integrated (04/18/2024)  Stress: No Stress Concern Present (01/01/2022)  Tobacco Use: Medium Risk (04/17/2024)   SDOH Interventions:

## 2024-04-18 NOTE — Progress Notes (Signed)
 SLP Cancellation Note  Patient Details Name: KAYHAN BOARDLEY MRN: [id number] DOB: 1946-05-25   Cancelled treatment:       Reason Eval/Treat Not Completed: Other (comment) (Pt with known dementia and increase in cognitive decline in the last 6+ months. SLE will be deferred due to baseline status.)  Thank you,  Lamar Candy, CCC-SLP [phone]  Savalas Monje 04/18/2024, 4:16 PM

## 2024-04-18 NOTE — TOC Initial Note (Addendum)
 Transition of Care [facility]) - Initial/Assessment Note    Patient Details  Name: William Burns MRN: [id number] Date of Birth: 08/04/1945  [facility] CM/SW Contact:    Sharlyne Stabs, RN Phone Number: 04/18/2024, 1:04 PM  Clinical Narrative:        Patient admitted with acute metabolic encephalopathy. PT recommended SNF. CM spoke with his wife. They have a lot of support at home. They have been caring for him and plan to continue. They have walker, rollator, 3N1. Their son is always on the farm and Daughter in and out all day. They do want home health services. [facility] sending out. Patient planning to discharge home tomorrow.            Addendum BETHA Huxley with [facility] accepted the referral. MD aware to order PT/OT.  Expected Discharge Plan: Home w Home Health Services Barriers to Discharge: Continued Medical Work up   Patient Goals and CMS Choice Patient states their goals for this hospitalization and ongoing recovery are:: Return home CMS Medicare.gov Compare Post Acute Care list provided to:: Patient Represenative (must comment) Choice offered to / list presented to : Spouse [place] ownership interest in [facility]provided to:: Spouse    Expected Discharge Plan and Services      Living arrangements for the past 2 months: Single Family Home                      HH Arranged: PT, OT     Prior Living Arrangements/Services Living arrangements for the past 2 months: Single Family Home Lives with:: Spouse Patient language and need for interpreter reviewed:: Yes Do you feel safe going back to the place where you live?: Yes      Need for Family Participation in Patient Care: Yes (Comment) Care giver support system in place?: Yes (comment) Current home services: DME Criminal Activity/Legal Involvement Pertinent to Current Situation/Hospitalization: No - Comment as needed  Activities of Daily Living   ADL Screening (condition at time of  admission) Independently performs ADLs?: No Does the patient have a NEW difficulty with bathing/dressing/toileting/self-feeding that is expected to last >3 days?: Yes (Initiates electronic notice to provider for possible OT consult) Does the patient have a NEW difficulty with getting in/out of bed, walking, or climbing stairs that is expected to last >3 days?: Yes (Initiates electronic notice to provider for possible PT consult) Does the patient have a NEW difficulty with communication that is expected to last >3 days?: Yes (Initiates electronic notice to provider for possible SLP consult) Is the patient deaf or have difficulty hearing?: No Does the patient have difficulty seeing, even when wearing glasses/contacts?: No Does the patient have difficulty concentrating, remembering, or making decisions?: Yes  Permission Sought/Granted            Permission granted to share info w Relationship: wife     Emotional Assessment     Affect (typically observed): Accepting Orientation: : Oriented to Self Alcohol / Substance Use: Not Applicable Psych Involvement: No (comment)  Admission diagnosis:  Acute metabolic encephalopathy [G93.41] Patient Active Problem List   Diagnosis Date Noted   Acute metabolic encephalopathy 04/17/2024   Left thyroid  nodule 01/13/2019   Non-recurrent unilateral inguinal hernia without obstruction or gangrene    GERD (gastroesophageal reflux disease) 01/26/2015   TIA (transient ischemic attack)    Transient global amnesia 01/25/2015   Prostate cancer (HCC) 08/29/2014   Kidney stone on left side 08/02/2013   Diarrhea 07/05/2013  Short-segment Barrett's esophagus 02/09/2012   Calculus, bladder 07/04/2011   Tooth infection 07/03/2011   Adenocarcinoma of colon (HCC) 07/03/2011   Erosive gastritis 07/03/2011   Microcytic hypochromic anemia 07/02/2011   Hx of colonic polyp 07/02/2011   Hyperglycemia 07/02/2011   BPH (benign prostatic hyperplasia) 07/02/2011    PCP:  Shona Norleen PEDLAR, MD Pharmacy:   [facility] [place], [place] - [address] [place] [address] Phone: [phone] Fax: [phone]     Social Drivers of Health (SDOH) Social History: SDOH Screenings   Food Insecurity: No Food Insecurity (04/17/2024)  Housing: Low Risk  (04/17/2024)  Transportation Needs: No Transportation Needs (04/17/2024)  Utilities: Not At Risk (04/17/2024)  Alcohol Screen: Low Risk  (01/01/2022)  Depression (PHQ2-9): Low Risk  (01/01/2022)  Financial Resource Strain: Low Risk  (01/01/2022)  Physical Activity: Inactive (01/01/2022)  Social Connections: Moderately Integrated (04/18/2024)  Stress: No Stress Concern Present (01/01/2022)  Tobacco Use: Medium Risk (04/17/2024)   SDOH Interventions:     Readmission Risk Interventions     No data to display

## 2024-04-19 DIAGNOSIS — G9341 Metabolic encephalopathy: Secondary | ICD-10-CM | POA: Diagnosis not present

## 2024-04-19 LAB — BASIC METABOLIC PANEL WITH GFR
Anion gap: 9 (ref 5–15)
BUN: 22 mg/dL (ref 8–23)
CO2: 26 mmol/L (ref 22–32)
Calcium: 8.5 mg/dL — ABNORMAL LOW (ref 8.9–10.3)
Chloride: 106 mmol/L (ref 98–111)
Creatinine, Ser: 1.45 mg/dL — ABNORMAL HIGH (ref 0.61–1.24)
GFR, Estimated: 49 mL/min — ABNORMAL LOW (ref >60)
Glucose, Bld: 81 mg/dL (ref 70–99)
Potassium: 4.3 mmol/L (ref 3.5–5.1)
Sodium: 141 mmol/L (ref 135–145)

## 2024-04-19 LAB — URINE CULTURE

## 2024-04-19 NOTE — Progress Notes (Signed)
 Patient has discharge orders, discharge teaching complete and no further questions at this time.

## 2024-04-19 NOTE — TOC Transition Note (Signed)
 Transition of Care [facility]) - Discharge Note   Patient Details  Name: William Burns MRN: [id number] Date of Birth: 1945-11-28  [facility] CM/SW Contact:  Sharlyne Stabs, RN Phone Number: 04/19/2024, 10:33 AM   Clinical Narrative:   Patient discharging home with [facility] home health. Cory updated, orders placed.    Final next level of care: Home w Home Health Services Barriers to Discharge: Barriers Resolved   Patient Goals and CMS Choice Patient states their goals for this hospitalization and ongoing recovery are:: Return home CMS Medicare.gov Compare Post Acute Care list provided to:: Patient Represenative (must comment) Choice offered to / list presented to : Spouse [place] ownership interest in [facility].provided to:: Spouse    Discharge Placement      Patient and family notified of of transfer: 04/19/24  Discharge Plan and Services Additional resources added to the After Visit Summary for        [facility] Arranged: PT, OT HH Agency: [facility] Date [facility] Agency Contacted: 04/18/24 Time HH Agency Contacted: 1400 Representative spoke with at [facility] Agency: Darleene  Social Drivers of Health (SDOH) Interventions SDOH Screenings   Food Insecurity: No Food Insecurity (04/17/2024)  Housing: Low Risk  (04/17/2024)  Transportation Needs: No Transportation Needs (04/17/2024)  Utilities: Not At Risk (04/17/2024)  Alcohol Screen: Low Risk  (01/01/2022)  Depression (PHQ2-9): Low Risk  (01/01/2022)  Financial Resource Strain: Low Risk  (01/01/2022)  Physical Activity: Inactive (01/01/2022)  Social Connections: Moderately Integrated (04/18/2024)  Stress: No Stress Concern Present (01/01/2022)  Tobacco Use: Medium Risk (04/17/2024)    Readmission Risk Interventions    04/19/2024   10:25 AM  Readmission Risk Prevention Plan  Transportation Screening Complete  PCP or Specialist Appt within 5-7 Days Complete  Home Care Screening Complete  Medication  Review (RN CM) Complete

## 2024-04-19 NOTE — Discharge Summary (Signed)
 Physician Discharge Summary  William Burns FMW:[id number] DOB: 1945-07-18 DOA: 04/17/2024  PCP: William Norleen PEDLAR, MD  Admit date: 04/17/2024  Discharge date: 04/19/2024  Admitted From:Home  Disposition:  Home  Recommendations for Outpatient Follow-up:  Follow up with PCP in 1-2 weeks Please obtain BMP for follow-up in 1 week to ensure creatinine levels have stabilized Encouraged oral intake aggressively with meals at home and appears to be eating adequately. Discontinued gabapentin  as patient would like to see how he does without it Continue other home medications as prior  Home Health: With PT/OT  Equipment/Devices: None  Discharge Condition:Stable  CODE STATUS: Full  Diet recommendation: Heart Healthy  Brief/Interim Summary: 78 year old with history of BPH, colon cancer, GERD, dementia followed by outpatient neurologist, reportedly getting more weak and frequently falling for the last few days, not eating or drinking enough so brought to the ER. In the emergency room hemodynamically stable. On room air. Creatinine elevated. Admitted with IV fluids.  According to family at bedside, patient now appears to be at baseline level of mentation and is having adequate oral intake.  His creatinine levels still remain elevated as noted below, but he has been encouraged to continue with his oral intake at home and follow-up with PCP in approximately 5-7 days to repeat BMP to ensure return to baseline of his creatinine.  He does not appear to require any further IV fluid at this time and is eager for discharge.  No other acute events or concerns noted.  Discharge Diagnoses:  Principal Problem:   Acute metabolic encephalopathy  Principal discharge diagnosis: Acute metabolic encephalopathy likely in the setting of dehydration with progressive dementia.  Discharge Instructions  Discharge Instructions     Diet - low sodium heart healthy   Complete by: As directed    Increase activity slowly    Complete by: As directed       Allergies as of 04/19/2024   No Known Allergies      Medication List     STOP taking these medications    gabapentin  300 MG capsule Commonly known as: NEURONTIN        TAKE these medications    ACIDOPHILUS PO Take 1 capsule by mouth in the morning. Now Pb8 Acidophilus   aspirin  EC 81 MG tablet Take 81 mg by mouth daily. Swallow whole.   divalproex  250 MG DR tablet Commonly known as: DEPAKOTE  Take 1 tablet (250 mg total) by mouth in the morning and at bedtime.   Fish Oil 1200 MG Caps Take 1,200 mg by mouth in the morning.   loratadine 10 MG tablet Commonly known as: CLARITIN Take 10 mg by mouth in the morning.   memantine  10 MG tablet Commonly known as: NAMENDA  Take 1 tablet (10 mg total) by mouth 2 (two) times daily.   multivitamin with minerals Tabs tablet Take 1 tablet by mouth in the morning. Centrum Silver for Men 50+   pantoprazole  40 MG tablet Commonly known as: PROTONIX  TAKE 1 TABLET BY MOUTH ONCE DAILY.   Vitamin D3 125 MCG (5000 UT) Tabs Take 0.5 tablets by mouth in the morning.        Contact information for follow-up providers     William Norleen PEDLAR, MD. Schedule an appointment as soon as possible for a visit in 1 week(s).   Specialty: Internal Medicine Contact information: [address] [place] [address] [phone]              Contact information for after-discharge  care     Home Medical Care     [facility]) .   Service: Home Health Services Contact information: [address] [place]  [address] [phone]                    No Known Allergies  Consultations: None   Procedures/Studies: CT Head Wo Contrast Result Date: 04/17/2024 CLINICAL DATA:  Trauma, fall, increased altered mental status EXAM: CT HEAD WITHOUT CONTRAST TECHNIQUE: Contiguous axial images were obtained from the base of the skull through the vertex  without intravenous contrast. RADIATION DOSE REDUCTION: This exam was performed according to the departmental dose-optimization program which includes automated exposure control, adjustment of the mA and/or kV according to patient size and/or use of iterative reconstruction technique. COMPARISON:  None Available. FINDINGS: Brain: Progressive diffuse cerebral and cerebellar brain atrophy pattern. Chronic white matter microvascular ischemic changes as well. No acute intracranial hemorrhage, mass lesion, new infarction, midline shift, herniation, hydrocephalus, or extra-axial fluid collection. Cisterns are patent. Vascular: No hyperdense vessel or unexpected calcification. Skull: Normal. Negative for fracture or focal lesion. Sinuses/Orbits: No acute finding. Other: None. IMPRESSION: 1. Progressive brain atrophy and chronic white matter microvascular ischemic changes. 2. No acute intracranial abnormality by noncontrast CT. Electronically Signed   By: CHRISTELLA.  Shick M.D.   On: 04/17/2024 16:46   CT Cervical Spine Wo Contrast Result Date: 04/17/2024 CLINICAL DATA:  Neck trauma (Age >= 65y) Multiple falls with altered mental status. EXAM: CT CERVICAL SPINE WITHOUT CONTRAST TECHNIQUE: Multidetector CT imaging of the cervical spine was performed without intravenous contrast. Multiplanar CT image reconstructions were also generated. RADIATION DOSE REDUCTION: This exam was performed according to the departmental dose-optimization program which includes automated exposure control, adjustment of the mA and/or kV according to patient size and/or use of iterative reconstruction technique. COMPARISON:  None Available. FINDINGS: Alignment: Straightening with a mild convex left scoliosis. No focal angulation or listhesis. Skull base and vertebrae: No evidence of acute cervical spine fracture or traumatic subluxation. The right C2-3 facet joint is ankylosed. Soft tissues and spinal canal: No prevertebral fluid or swelling. No visible  canal hematoma. Disc levels: Multilevel spondylosis with uncinate spurring and bilateral facet hypertrophy. Up to moderate multifactorial spinal stenosis at C4-5 and C5-6. Multilevel foraminal narrowing which appears worst on the right at C3-4, C4-5 and C5-6. Left foraminal narrowing appears worst at C6-7. Upper chest: Clear lung apices. Other: None. IMPRESSION: 1. No evidence of acute cervical spine fracture, traumatic subluxation or static signs of instability. 2. Multilevel cervical spondylosis as described with up to moderate multifactorial spinal stenosis at C4-5 and C5-6. Multilevel osseous foraminal narrowing. Electronically Signed   By: Elsie Perone M.D.   On: 04/17/2024 16:37   DG Chest Port 1 View Result Date: 04/17/2024 CLINICAL DATA:  Altered mental status. EXAM: PORTABLE CHEST 1 VIEW COMPARISON:  12/30/2023 FINDINGS: The heart size and mediastinal contours are within normal limits. Both lungs are clear. The visualized skeletal structures are unremarkable. IMPRESSION: No active disease. Electronically Signed   By: Norleen DELENA Kil M.D.   On: 04/17/2024 15:46     Discharge Exam: Vitals:   04/19/24 0152 04/19/24 0840  BP: (!) 148/75 (!) 147/74  Pulse: 74 68  Resp: 18   Temp: 98.1 F (36.7 C) 98.5 F (36.9 C)  SpO2: 95%    Vitals:   04/18/24 1745 04/18/24 2135 04/19/24 0152 04/19/24 0840  BP: 130/77 (!) 161/70 (!) 148/75 (!) 147/74  Pulse: 72 74 74 68  Resp: 18 17 18    Temp: 98 F (36.7 C) 98.3 F (36.8 C) 98.1 F (36.7 C) 98.5 F (36.9 C)  TempSrc: Oral Oral Oral Oral  SpO2: 97% 97% 95%   Weight:      Height:        General: Pt is alert, awake, not in acute distress Cardiovascular: RRR, S1/S2 +, no rubs, no gallops Respiratory: CTA bilaterally, no wheezing, no rhonchi Abdominal: Soft, NT, ND, bowel sounds + Extremities: no edema, no cyanosis    The results of significant diagnostics from this hospitalization (including imaging, microbiology, ancillary and  laboratory) are listed below for reference.     Microbiology: Recent Results (from the past 240 hours)  Resp panel by RT-PCR (RSV, Flu A&B, Covid) Anterior Nasal Swab     Status: None   Collection Time: 04/17/24  3:08 PM   Specimen: Anterior Nasal Swab  Result Value Ref Range Status   SARS Coronavirus 2 by RT PCR NEGATIVE NEGATIVE Final    Comment: (NOTE) SARS-CoV-2 target nucleic acids are NOT DETECTED.  The SARS-CoV-2 RNA is generally detectable in upper respiratory specimens during the acute phase of infection. The lowest concentration of SARS-CoV-2 viral copies this assay can detect is 138 copies/mL. A negative result does not preclude SARS-Cov-2 infection and should not be used as the sole basis for treatment or other patient management decisions. A negative result may occur with  improper specimen collection/handling, submission of specimen other than nasopharyngeal swab, presence of viral mutation(s) within the areas targeted by this assay, and inadequate number of viral copies(<138 copies/mL). A negative result must be combined with clinical observations, patient history, and epidemiological information. The expected result is Negative.  Fact Sheet for Patients:  bloggercourse.com  Fact Sheet for Healthcare Providers:  seriousbroker.it  This test is no t yet approved or cleared by the [place]  FDA and  has been authorized for detection and/or diagnosis of SARS-CoV-2 by FDA under an Emergency Use Authorization (EUA). This EUA will remain  in effect (meaning this test can be used) for the duration of the COVID-19 declaration under Section 564(b)(1) of the Act, 21 U.S.C.section 360bbb-3(b)(1), unless the authorization is terminated  or revoked sooner.       Influenza A by PCR NEGATIVE NEGATIVE Final   Influenza B by PCR NEGATIVE NEGATIVE Final    Comment: (NOTE) The Xpert Xpress SARS-CoV-2/FLU/RSV plus assay is  intended as an aid in the diagnosis of influenza from Nasopharyngeal swab specimens and should not be used as a sole basis for treatment. Nasal washings and aspirates are unacceptable for Xpert Xpress SARS-CoV-2/FLU/RSV testing.  Fact Sheet for Patients: bloggercourse.com  Fact Sheet for Healthcare Providers: seriousbroker.it  This test is not yet approved or cleared by the [place]  FDA and has been authorized for detection and/or diagnosis of SARS-CoV-2 by FDA under an Emergency Use Authorization (EUA). This EUA will remain in effect (meaning this test can be used) for the duration of the COVID-19 declaration under Section 564(b)(1) of the Act, 21 U.S.C. section 360bbb-3(b)(1), unless the authorization is terminated or revoked.     Resp Syncytial Virus by PCR NEGATIVE NEGATIVE Final    Comment: (NOTE) Fact Sheet for Patients: bloggercourse.com  Fact Sheet for Healthcare Providers: seriousbroker.it  This test is not yet approved or cleared by the [place]  FDA and has been authorized for detection and/or diagnosis of SARS-CoV-2 by FDA under an Emergency Use Authorization (EUA). This EUA will remain  in effect (meaning this test can be used) for the duration of the COVID-19 declaration under Section 564(b)(1) of the Act, 21 U.S.C. section 360bbb-3(b)(1), unless the authorization is terminated or revoked.  Performed at [facility], [address], [place], [place] [address]   Urine Culture     Status: Abnormal   Collection Time: 04/17/24  4:10 PM   Specimen: Urine, Clean Catch  Result Value Ref Range Status   Specimen Description   Final    URINE, CLEAN CATCH Performed at [facility], [address] [place], [place] [address]    Special Requests   Final    NONE Performed at [facility], [address] [place], [place] [address]    Culture MULTIPLE SPECIES  PRESENT, SUGGEST RECOLLECTION (A)  Final   Report Status 04/19/2024 FINAL  Final  MRSA Next Gen by PCR, Nasal     Status: None   Collection Time: 04/17/24 11:07 PM   Specimen: Nasal Mucosa; Nasal Swab  Result Value Ref Range Status   MRSA by PCR Next Gen NOT DETECTED NOT DETECTED Final    Comment: (NOTE) The GeneXpert MRSA Assay (FDA approved for NASAL specimens only), is one component of a comprehensive MRSA colonization surveillance program. It is not intended to diagnose MRSA infection nor to guide or monitor treatment for MRSA infections. Test performance is not FDA approved in patients less than 60 years old. Performed at [facility], [address] [place], [place] [address]      Labs: BNP (last 3 results) No results for input(s): BNP in the last 8760 hours. Basic Metabolic Panel: Recent Labs  Lab 04/17/24 1412 04/18/24 0357 04/19/24 0417  NA 142 143 141  K 4.4 4.1 4.3  CL 102 106 106  CO2 30 28 26   GLUCOSE 79 87 81  BUN 24* 22 22  CREATININE 1.48* 1.36* 1.45*  CALCIUM  9.1 8.6* 8.5*   Liver Function Tests: Recent Labs  Lab 04/17/24 1412  AST 37  ALT 31  ALKPHOS 97  BILITOT 0.4  PROT 7.8  ALBUMIN 3.9   No results for input(s): LIPASE, AMYLASE in the last 168 hours. No results for input(s): AMMONIA in the last 168 hours. CBC: Recent Labs  Lab 04/17/24 1412 04/18/24 0357  WBC 7.3 7.9  HGB 12.8* 12.5*  HCT 40.2 38.9*  MCV 92.0 91.5  PLT 428* 412*   Cardiac Enzymes: No results for input(s): CKTOTAL, CKMB, CKMBINDEX, TROPONINI in the last 168 hours. BNP: Invalid input(s): POCBNP CBG: Recent Labs  Lab 04/17/24 1438  GLUCAP 72   D-Dimer No results for input(s): DDIMER in the last 72 hours. Hgb A1c No results for input(s): HGBA1C in the last 72 hours. Lipid Profile No results for input(s): CHOL, HDL, LDLCALC, TRIG, CHOLHDL, LDLDIRECT in the last 72 hours. Thyroid  function studies No results for input(s):  TSH, T4TOTAL, T3FREE, THYROIDAB in the last 72 hours.  Invalid input(s): FREET3 Anemia work up No results for input(s): VITAMINB12, FOLATE, FERRITIN, TIBC, IRON, RETICCTPCT in the last 72 hours. Urinalysis    Component Value Date/Time   COLORURINE YELLOW 04/17/2024 1610   APPEARANCEUR CLEAR 04/17/2024 1610   LABSPEC 1.021 04/17/2024 1610   PHURINE 5.0 04/17/2024 1610   GLUCOSEU NEGATIVE 04/17/2024 1610   HGBUR NEGATIVE 04/17/2024 1610   BILIRUBINUR NEGATIVE 04/17/2024 1610   KETONESUR 5 (A) 04/17/2024 1610   PROTEINUR 30 (A) 04/17/2024 1610   UROBILINOGEN 0.2 01/25/2015 1839   NITRITE NEGATIVE 04/17/2024 1610   LEUKOCYTESUR NEGATIVE 04/17/2024 1610  Sepsis Labs Recent Labs  Lab 04/17/24 1412 04/18/24 0357  WBC 7.3 7.9   Microbiology Recent Results (from the past 240 hours)  Resp panel by RT-PCR (RSV, Flu A&B, Covid) Anterior Nasal Swab     Status: None   Collection Time: 04/17/24  3:08 PM   Specimen: Anterior Nasal Swab  Result Value Ref Range Status   SARS Coronavirus 2 by RT PCR NEGATIVE NEGATIVE Final    Comment: (NOTE) SARS-CoV-2 target nucleic acids are NOT DETECTED.  The SARS-CoV-2 RNA is generally detectable in upper respiratory specimens during the acute phase of infection. The lowest concentration of SARS-CoV-2 viral copies this assay can detect is 138 copies/mL. A negative result does not preclude SARS-Cov-2 infection and should not be used as the sole basis for treatment or other patient management decisions. A negative result may occur with  improper specimen collection/handling, submission of specimen other than nasopharyngeal swab, presence of viral mutation(s) within the areas targeted by this assay, and inadequate number of viral copies(<138 copies/mL). A negative result must be combined with clinical observations, patient history, and epidemiological information. The expected result is Negative.  Fact Sheet for Patients:   bloggercourse.com  Fact Sheet for Healthcare Providers:  seriousbroker.it  This test is no t yet approved or cleared by the [place]  FDA and  has been authorized for detection and/or diagnosis of SARS-CoV-2 by FDA under an Emergency Use Authorization (EUA). This EUA will remain  in effect (meaning this test can be used) for the duration of the COVID-19 declaration under Section 564(b)(1) of the Act, 21 U.S.C.section 360bbb-3(b)(1), unless the authorization is terminated  or revoked sooner.       Influenza A by PCR NEGATIVE NEGATIVE Final   Influenza B by PCR NEGATIVE NEGATIVE Final    Comment: (NOTE) The Xpert Xpress SARS-CoV-2/FLU/RSV plus assay is intended as an aid in the diagnosis of influenza from Nasopharyngeal swab specimens and should not be used as a sole basis for treatment. Nasal washings and aspirates are unacceptable for Xpert Xpress SARS-CoV-2/FLU/RSV testing.  Fact Sheet for Patients: bloggercourse.com  Fact Sheet for Healthcare Providers: seriousbroker.it  This test is not yet approved or cleared by the [place]  FDA and has been authorized for detection and/or diagnosis of SARS-CoV-2 by FDA under an Emergency Use Authorization (EUA). This EUA will remain in effect (meaning this test can be used) for the duration of the COVID-19 declaration under Section 564(b)(1) of the Act, 21 U.S.C. section 360bbb-3(b)(1), unless the authorization is terminated or revoked.     Resp Syncytial Virus by PCR NEGATIVE NEGATIVE Final    Comment: (NOTE) Fact Sheet for Patients: bloggercourse.com  Fact Sheet for Healthcare Providers: seriousbroker.it  This test is not yet approved or cleared by the [place]  FDA and has been authorized for detection and/or diagnosis of SARS-CoV-2 by FDA under an Emergency Use  Authorization (EUA). This EUA will remain in effect (meaning this test can be used) for the duration of the COVID-19 declaration under Section 564(b)(1) of the Act, 21 U.S.C. section 360bbb-3(b)(1), unless the authorization is terminated or revoked.  Performed at [facility], [address], [place], [place] [address]   Urine Culture     Status: Abnormal   Collection Time: 04/17/24  4:10 PM   Specimen: Urine, Clean Catch  Result Value Ref Range Status   Specimen Description   Final    URINE, CLEAN CATCH Performed at [facility], [address] [place], [place] [address]    Special  Requests   Final    NONE Performed at [facility], [address], [place], [place] [address]    Culture MULTIPLE SPECIES PRESENT, SUGGEST RECOLLECTION (A)  Final   Report Status 04/19/2024 FINAL  Final  MRSA Next Gen by PCR, Nasal     Status: None   Collection Time: 04/17/24 11:07 PM   Specimen: Nasal Mucosa; Nasal Swab  Result Value Ref Range Status   MRSA by PCR Next Gen NOT DETECTED NOT DETECTED Final    Comment: (NOTE) The GeneXpert MRSA Assay (FDA approved for NASAL specimens only), is one component of a comprehensive MRSA colonization surveillance program. It is not intended to diagnose MRSA infection nor to guide or monitor treatment for MRSA infections. Test performance is not FDA approved in patients less than 41 years old. Performed at [facility], [address] [place], [place] [address]      Time coordinating discharge: 35 minutes  SIGNED:   Adron JONETTA Fairly, DO [facility] 04/19/2024, 9:51 AM  If 7PM-7AM, please contact night-coverage [email or web address]

## 2024-05-04 DIAGNOSIS — N1831 Chronic kidney disease, stage 3a: Secondary | ICD-10-CM | POA: Diagnosis not present

## 2024-05-04 DIAGNOSIS — D649 Anemia, unspecified: Secondary | ICD-10-CM | POA: Diagnosis not present

## 2024-05-04 DIAGNOSIS — G9341 Metabolic encephalopathy: Secondary | ICD-10-CM | POA: Diagnosis not present

## 2024-05-04 DIAGNOSIS — R7303 Prediabetes: Secondary | ICD-10-CM | POA: Diagnosis not present

## 2024-05-04 DIAGNOSIS — M4802 Spinal stenosis, cervical region: Secondary | ICD-10-CM | POA: Diagnosis not present

## 2024-05-04 DIAGNOSIS — E538 Deficiency of other specified B group vitamins: Secondary | ICD-10-CM | POA: Diagnosis not present

## 2024-05-04 DIAGNOSIS — G40909 Epilepsy, unspecified, not intractable, without status epilepticus: Secondary | ICD-10-CM | POA: Diagnosis not present

## 2024-05-04 DIAGNOSIS — D631 Anemia in chronic kidney disease: Secondary | ICD-10-CM | POA: Diagnosis not present

## 2024-05-04 DIAGNOSIS — R4189 Other symptoms and signs involving cognitive functions and awareness: Secondary | ICD-10-CM | POA: Diagnosis not present

## 2024-05-04 DIAGNOSIS — H8113 Benign paroxysmal vertigo, bilateral: Secondary | ICD-10-CM | POA: Diagnosis not present

## 2024-05-04 DIAGNOSIS — E782 Mixed hyperlipidemia: Secondary | ICD-10-CM | POA: Diagnosis not present

## 2024-05-04 DIAGNOSIS — R269 Unspecified abnormalities of gait and mobility: Secondary | ICD-10-CM | POA: Diagnosis not present

## 2024-12-01 ENCOUNTER — Ambulatory Visit: Admitting: Neurology
